# Patient Record
Sex: Female | Born: 1935 | Race: White | Hispanic: No | Smoking: Never smoker
Health system: Southern US, Community
[De-identification: ages and names within clinical notes are randomized; demographics above are authoritative.]

## PROBLEM LIST (undated history)

## (undated) DIAGNOSIS — F32A Depression, unspecified: Secondary | ICD-10-CM

## (undated) DIAGNOSIS — N390 Urinary tract infection, site not specified: Secondary | ICD-10-CM

## (undated) DIAGNOSIS — R55 Syncope and collapse: Secondary | ICD-10-CM

## (undated) DIAGNOSIS — N3020 Other chronic cystitis without hematuria: Secondary | ICD-10-CM

## (undated) DIAGNOSIS — K759 Inflammatory liver disease, unspecified: Secondary | ICD-10-CM

## (undated) DIAGNOSIS — T7840XA Allergy, unspecified, initial encounter: Secondary | ICD-10-CM

## (undated) DIAGNOSIS — M1990 Unspecified osteoarthritis, unspecified site: Secondary | ICD-10-CM

## (undated) DIAGNOSIS — F329 Major depressive disorder, single episode, unspecified: Secondary | ICD-10-CM

## (undated) DIAGNOSIS — H547 Unspecified visual loss: Secondary | ICD-10-CM

## (undated) DIAGNOSIS — Z87448 Personal history of other diseases of urinary system: Secondary | ICD-10-CM

## (undated) DIAGNOSIS — H548 Legal blindness, as defined in USA: Secondary | ICD-10-CM

## (undated) DIAGNOSIS — K259 Gastric ulcer, unspecified as acute or chronic, without hemorrhage or perforation: Secondary | ICD-10-CM

## (undated) DIAGNOSIS — E785 Hyperlipidemia, unspecified: Secondary | ICD-10-CM

## (undated) HISTORY — DX: Unspecified osteoarthritis, unspecified site: M19.90

## (undated) HISTORY — DX: Other chronic cystitis without hematuria: N30.20

## (undated) HISTORY — PX: CATARACT EXTRACTION W/ INTRAOCULAR LENS IMPLANT: SHX1309

## (undated) HISTORY — DX: Inflammatory liver disease, unspecified: K75.9

## (undated) HISTORY — PX: BLADDER SUSPENSION: SHX72

## (undated) HISTORY — DX: Allergy, unspecified, initial encounter: T78.40XA

## (undated) HISTORY — DX: Depression, unspecified: F32.A

## (undated) HISTORY — PX: ABDOMINAL HYSTERECTOMY: SHX81

## (undated) HISTORY — DX: Personal history of other diseases of urinary system: Z87.448

## (undated) HISTORY — DX: Major depressive disorder, single episode, unspecified: F32.9

## (undated) HISTORY — DX: Unspecified visual loss: H54.7

## (undated) HISTORY — DX: Hyperlipidemia, unspecified: E78.5

## (undated) HISTORY — PX: ESOPHAGOGASTRODUODENOSCOPY: SHX1529

---

## 1999-11-03 ENCOUNTER — Encounter: Admission: RE | Admit: 1999-11-03 | Discharge: 1999-11-03 | Payer: Self-pay | Admitting: Internal Medicine

## 1999-11-03 ENCOUNTER — Encounter: Payer: Self-pay | Admitting: Internal Medicine

## 2000-11-15 ENCOUNTER — Encounter: Admission: RE | Admit: 2000-11-15 | Discharge: 2000-11-15 | Payer: Self-pay | Admitting: Internal Medicine

## 2000-11-15 ENCOUNTER — Encounter: Payer: Self-pay | Admitting: Internal Medicine

## 2001-03-15 ENCOUNTER — Other Ambulatory Visit: Admission: RE | Admit: 2001-03-15 | Discharge: 2001-03-15 | Payer: Self-pay | Admitting: Gastroenterology

## 2001-03-15 ENCOUNTER — Encounter (INDEPENDENT_AMBULATORY_CARE_PROVIDER_SITE_OTHER): Payer: Self-pay | Admitting: Specialist

## 2001-11-16 ENCOUNTER — Encounter: Payer: Self-pay | Admitting: Internal Medicine

## 2001-11-16 ENCOUNTER — Encounter: Admission: RE | Admit: 2001-11-16 | Discharge: 2001-11-16 | Payer: Self-pay | Admitting: Internal Medicine

## 2002-11-27 ENCOUNTER — Encounter: Admission: RE | Admit: 2002-11-27 | Discharge: 2002-11-27 | Payer: Self-pay | Admitting: Internal Medicine

## 2002-11-27 ENCOUNTER — Encounter: Payer: Self-pay | Admitting: Internal Medicine

## 2003-11-28 ENCOUNTER — Encounter: Admission: RE | Admit: 2003-11-28 | Discharge: 2003-11-28 | Payer: Self-pay | Admitting: Internal Medicine

## 2004-12-25 ENCOUNTER — Encounter: Admission: RE | Admit: 2004-12-25 | Discharge: 2004-12-25 | Payer: Self-pay | Admitting: Internal Medicine

## 2005-03-03 ENCOUNTER — Ambulatory Visit: Payer: Self-pay | Admitting: Gastroenterology

## 2005-03-18 ENCOUNTER — Ambulatory Visit: Payer: Self-pay | Admitting: Gastroenterology

## 2005-10-30 ENCOUNTER — Emergency Department (HOSPITAL_COMMUNITY): Admission: EM | Admit: 2005-10-30 | Discharge: 2005-10-30 | Payer: Self-pay | Admitting: Family Medicine

## 2005-12-27 ENCOUNTER — Ambulatory Visit: Payer: Self-pay | Admitting: Internal Medicine

## 2005-12-30 ENCOUNTER — Encounter: Admission: RE | Admit: 2005-12-30 | Discharge: 2005-12-30 | Payer: Self-pay | Admitting: Internal Medicine

## 2006-01-20 ENCOUNTER — Ambulatory Visit: Payer: Self-pay | Admitting: Internal Medicine

## 2006-02-06 ENCOUNTER — Emergency Department (HOSPITAL_COMMUNITY): Admission: EM | Admit: 2006-02-06 | Discharge: 2006-02-06 | Payer: Self-pay | Admitting: Emergency Medicine

## 2006-02-07 ENCOUNTER — Ambulatory Visit: Payer: Self-pay | Admitting: Internal Medicine

## 2006-02-07 ENCOUNTER — Ambulatory Visit: Payer: Self-pay | Admitting: Gastroenterology

## 2006-02-15 ENCOUNTER — Ambulatory Visit: Payer: Self-pay | Admitting: Gastroenterology

## 2006-03-15 ENCOUNTER — Ambulatory Visit: Payer: Self-pay | Admitting: Gastroenterology

## 2006-05-09 ENCOUNTER — Emergency Department (HOSPITAL_COMMUNITY): Admission: EM | Admit: 2006-05-09 | Discharge: 2006-05-09 | Payer: Self-pay | Admitting: Family Medicine

## 2007-01-12 ENCOUNTER — Encounter: Admission: RE | Admit: 2007-01-12 | Discharge: 2007-01-12 | Payer: Self-pay | Admitting: Internal Medicine

## 2007-01-12 LAB — HM MAMMOGRAPHY: HM Mammogram: NORMAL

## 2007-01-19 ENCOUNTER — Ambulatory Visit: Payer: Self-pay | Admitting: Internal Medicine

## 2007-01-19 LAB — CONVERTED CEMR LAB
ALT: 53 units/L — ABNORMAL HIGH (ref 0–35)
AST: 68 units/L — ABNORMAL HIGH (ref 0–37)
Albumin: 3.8 g/dL (ref 3.5–5.2)
Basophils Absolute: 0.1 10*3/uL (ref 0.0–0.1)
Cholesterol: 246 mg/dL (ref 0–200)
Direct LDL: 172.1 mg/dL
HCT: 41.2 % (ref 36.0–46.0)
HDL: 51.1 mg/dL (ref >39.0)
Monocytes Absolute: 0.6 10*3/uL (ref 0.2–0.7)
Monocytes Relative: 11.6 % — ABNORMAL HIGH (ref 3.0–11.0)
Neutrophils Relative %: 62.4 % (ref 43.0–77.0)
Platelets: 246 10*3/uL (ref 150–400)
RBC: 4.53 M/uL (ref 3.87–5.11)
RDW: 12 % (ref 11.5–14.6)
TSH: 1.3 microintl units/mL (ref 0.35–5.50)
Total Bilirubin: 1 mg/dL (ref 0.3–1.2)
Total Protein: 7.4 g/dL (ref 6.0–8.3)

## 2007-01-20 DIAGNOSIS — Z87448 Personal history of other diseases of urinary system: Secondary | ICD-10-CM

## 2007-01-20 DIAGNOSIS — K759 Inflammatory liver disease, unspecified: Secondary | ICD-10-CM | POA: Insufficient documentation

## 2007-02-21 ENCOUNTER — Encounter: Payer: Self-pay | Admitting: Internal Medicine

## 2007-02-21 ENCOUNTER — Ambulatory Visit: Payer: Self-pay | Admitting: Internal Medicine

## 2007-02-21 DIAGNOSIS — E785 Hyperlipidemia, unspecified: Secondary | ICD-10-CM | POA: Insufficient documentation

## 2007-04-07 ENCOUNTER — Emergency Department (HOSPITAL_COMMUNITY): Admission: EM | Admit: 2007-04-07 | Discharge: 2007-04-07 | Payer: Self-pay | Admitting: Emergency Medicine

## 2007-04-17 ENCOUNTER — Encounter: Payer: Self-pay | Admitting: Internal Medicine

## 2007-04-26 ENCOUNTER — Ambulatory Visit: Payer: Self-pay | Admitting: Internal Medicine

## 2007-05-01 ENCOUNTER — Encounter: Payer: Self-pay | Admitting: Internal Medicine

## 2007-05-04 ENCOUNTER — Telehealth: Payer: Self-pay | Admitting: Internal Medicine

## 2007-05-05 LAB — CONVERTED CEMR LAB
Albumin: 3.3 g/dL — ABNORMAL LOW (ref 3.5–5.2)
Alkaline Phosphatase: 122 units/L — ABNORMAL HIGH (ref 39–117)
Basophils Relative: 1 % (ref 0.0–1.0)
Cholesterol: 225 mg/dL (ref 0–200)
Direct LDL: 152.1 mg/dL
Eosinophils Absolute: 0.2 10*3/uL (ref 0.0–0.6)
Eosinophils Relative: 2.5 % (ref 0.0–5.0)
HCT: 34.8 % — ABNORMAL LOW (ref 36.0–46.0)
Hemoglobin: 11.9 g/dL — ABNORMAL LOW (ref 12.0–15.0)
MCHC: 34.1 g/dL (ref 30.0–36.0)
MCV: 91.1 fL (ref 78.0–100.0)
Monocytes Relative: 11.1 % — ABNORMAL HIGH (ref 3.0–11.0)
Platelets: 275 10*3/uL (ref 150–400)
RBC: 3.82 M/uL — ABNORMAL LOW (ref 3.87–5.11)
Total Protein: 6.9 g/dL (ref 6.0–8.3)
Triglycerides: 49 mg/dL (ref 0–149)
VLDL: 10 mg/dL (ref 0–40)

## 2007-05-29 ENCOUNTER — Encounter: Payer: Self-pay | Admitting: Internal Medicine

## 2007-07-03 ENCOUNTER — Encounter: Payer: Self-pay | Admitting: Internal Medicine

## 2007-09-22 ENCOUNTER — Ambulatory Visit: Payer: Self-pay | Admitting: Internal Medicine

## 2007-09-25 ENCOUNTER — Encounter (INDEPENDENT_AMBULATORY_CARE_PROVIDER_SITE_OTHER): Payer: Self-pay | Admitting: *Deleted

## 2007-10-04 ENCOUNTER — Encounter: Payer: Self-pay | Admitting: Internal Medicine

## 2007-10-16 ENCOUNTER — Telehealth: Payer: Self-pay | Admitting: Internal Medicine

## 2007-12-04 ENCOUNTER — Telehealth: Payer: Self-pay | Admitting: Gastroenterology

## 2007-12-04 DIAGNOSIS — R1033 Periumbilical pain: Secondary | ICD-10-CM | POA: Insufficient documentation

## 2007-12-05 ENCOUNTER — Ambulatory Visit: Payer: Self-pay | Admitting: Gastroenterology

## 2007-12-05 LAB — CONVERTED CEMR LAB
ALT: 15 units/L (ref 0–35)
AST: 27 units/L (ref 0–37)
Albumin: 3.5 g/dL (ref 3.5–5.2)
BUN: 16 mg/dL (ref 6–23)
Bilirubin, Direct: 0.1 mg/dL (ref 0.0–0.3)
Chloride: 106 meq/L (ref 96–112)
GFR calc Af Amer: 68 mL/min
GFR calc non Af Amer: 57 mL/min
Glucose, Bld: 111 mg/dL — ABNORMAL HIGH (ref 70–99)
Lymphocytes Relative: 32.1 % (ref 12.0–46.0)
MCHC: 33.1 g/dL (ref 30.0–36.0)
Monocytes Relative: 10 % (ref 3.0–12.0)
Platelets: 244 10*3/uL (ref 150–400)
RBC: 4.26 M/uL (ref 3.87–5.11)
Sodium: 139 meq/L (ref 135–145)
Total Protein: 6.9 g/dL (ref 6.0–8.3)
WBC: 4.7 10*3/uL (ref 4.5–10.5)

## 2007-12-06 ENCOUNTER — Ambulatory Visit: Payer: Self-pay | Admitting: Gastroenterology

## 2007-12-08 ENCOUNTER — Ambulatory Visit: Payer: Self-pay | Admitting: Internal Medicine

## 2007-12-11 ENCOUNTER — Telehealth: Payer: Self-pay | Admitting: Gastroenterology

## 2007-12-12 ENCOUNTER — Ambulatory Visit: Payer: Self-pay | Admitting: Gastroenterology

## 2007-12-15 ENCOUNTER — Encounter: Payer: Self-pay | Admitting: Gastroenterology

## 2007-12-15 ENCOUNTER — Ambulatory Visit: Payer: Self-pay | Admitting: Gastroenterology

## 2007-12-18 ENCOUNTER — Telehealth (INDEPENDENT_AMBULATORY_CARE_PROVIDER_SITE_OTHER): Payer: Self-pay | Admitting: *Deleted

## 2007-12-20 ENCOUNTER — Encounter: Payer: Self-pay | Admitting: Gastroenterology

## 2008-01-19 ENCOUNTER — Telehealth: Payer: Self-pay | Admitting: Gastroenterology

## 2008-01-23 ENCOUNTER — Ambulatory Visit: Payer: Self-pay | Admitting: Gastroenterology

## 2008-01-23 ENCOUNTER — Telehealth: Payer: Self-pay | Admitting: Gastroenterology

## 2008-01-24 ENCOUNTER — Telehealth (INDEPENDENT_AMBULATORY_CARE_PROVIDER_SITE_OTHER): Payer: Self-pay | Admitting: *Deleted

## 2008-01-31 ENCOUNTER — Telehealth: Payer: Self-pay | Admitting: Gastroenterology

## 2008-02-02 ENCOUNTER — Ambulatory Visit (HOSPITAL_COMMUNITY): Admission: RE | Admit: 2008-02-02 | Discharge: 2008-02-02 | Payer: Self-pay | Admitting: Gastroenterology

## 2008-03-08 ENCOUNTER — Ambulatory Visit: Payer: Self-pay | Admitting: Internal Medicine

## 2008-03-08 DIAGNOSIS — R634 Abnormal weight loss: Secondary | ICD-10-CM

## 2008-03-08 DIAGNOSIS — G4700 Insomnia, unspecified: Secondary | ICD-10-CM

## 2008-03-11 ENCOUNTER — Ambulatory Visit: Payer: Self-pay | Admitting: Psychology

## 2008-03-22 ENCOUNTER — Ambulatory Visit: Payer: Self-pay | Admitting: Internal Medicine

## 2008-04-04 ENCOUNTER — Observation Stay (HOSPITAL_COMMUNITY): Admission: AD | Admit: 2008-04-04 | Discharge: 2008-04-06 | Payer: Self-pay | Admitting: Internal Medicine

## 2008-04-04 ENCOUNTER — Ambulatory Visit: Payer: Self-pay | Admitting: Internal Medicine

## 2008-04-04 ENCOUNTER — Encounter: Payer: Self-pay | Admitting: Internal Medicine

## 2008-04-19 ENCOUNTER — Ambulatory Visit: Payer: Self-pay | Admitting: Internal Medicine

## 2008-05-09 ENCOUNTER — Ambulatory Visit: Payer: Self-pay | Admitting: Internal Medicine

## 2008-06-24 ENCOUNTER — Ambulatory Visit: Payer: Self-pay | Admitting: Internal Medicine

## 2008-06-24 ENCOUNTER — Encounter: Payer: Self-pay | Admitting: Internal Medicine

## 2009-02-05 ENCOUNTER — Ambulatory Visit: Payer: Self-pay | Admitting: Internal Medicine

## 2010-03-06 ENCOUNTER — Ambulatory Visit: Payer: Self-pay | Admitting: Internal Medicine

## 2010-03-06 DIAGNOSIS — F329 Major depressive disorder, single episode, unspecified: Secondary | ICD-10-CM

## 2010-03-06 DIAGNOSIS — F32A Depression, unspecified: Secondary | ICD-10-CM | POA: Insufficient documentation

## 2010-03-06 DIAGNOSIS — F450 Somatization disorder: Secondary | ICD-10-CM

## 2010-03-06 LAB — CONVERTED CEMR LAB
ALT: 13 units/L (ref 0–35)
Albumin: 3.6 g/dL (ref 3.5–5.2)
Alkaline Phosphatase: 73 units/L (ref 39–117)
Basophils Absolute: 0 10*3/uL (ref 0.0–0.1)
Calcium: 9.5 mg/dL (ref 8.4–10.5)
GFR calc non Af Amer: 67.83 mL/min (ref >60)
HCT: 39.1 % (ref 36.0–46.0)
Hemoglobin: 13.3 g/dL (ref 12.0–15.0)
Lipase: 54 units/L (ref 11.0–59.0)
Lymphs Abs: 1.1 10*3/uL (ref 0.7–4.0)
MCV: 92 fL (ref 78.0–100.0)
Monocytes Relative: 12 % (ref 3.0–12.0)
Neutrophils Relative %: 61.6 % (ref 43.0–77.0)
Platelets: 203 10*3/uL (ref 150.0–400.0)
Potassium: 4.4 meq/L (ref 3.5–5.1)
Sodium: 140 meq/L (ref 135–145)
Total Bilirubin: 0.5 mg/dL (ref 0.3–1.2)
Urine Glucose: NEGATIVE mg/dL
WBC: 4.6 10*3/uL (ref 4.5–10.5)
pH: 6 (ref 5.0–8.0)

## 2010-03-23 ENCOUNTER — Telehealth: Payer: Self-pay | Admitting: Internal Medicine

## 2010-05-05 ENCOUNTER — Telehealth: Payer: Self-pay | Admitting: Internal Medicine

## 2010-07-23 NOTE — Assessment & Plan Note (Signed)
Summary: F/U APPT/#/CD   Vital Signs:  Patient profile:   77 year old female Height:      68 inches Weight:      119.25 pounds BMI:     18.20 O2 Sat:      97 % on Room air Temp:     96.1 degrees F oral Pulse rate:   80 / minute Pulse (ortho):   16 / minute Pulse rhythm:   regular Resp:     16 per minute BP sitting:   100 / 66 Cuff size:   regular  O2 Flow:  Room air  Primary Care [place]:  Etta Grandchild MD   History of Present Illness: She returns for f/up and she says that she ran out of her supply of meds one month ago and since then she has had a recurrence of depression, diffuse abd. pain, and a rotten taste in her mouth. She has anhedonia, fatigue, apathy, and insomnia ( DFA, EMA, EA ).  Preventive Screening-Counseling & Management  Alcohol-Tobacco     Alcohol drinks/day: 0     Smoking Status: never     Passive Smoke Exposure: no     Tobacco Counseling: not indicated; no tobacco use  Hep-HIV-STD-Contraception     Hepatitis Risk: no risk noted     HIV Risk: no     STD Risk: no risk noted      Sexual History:  not active.        Drug Use:  never.        Blood Transfusions:  no.    Medications Prior to Update: 1)  Remeron 30 Mg Tabs (Mirtazapine) .... One By Mouth Qhs 2)  Fish Oil 1000mg  .... 2 Once Daily 3)  Ambien 10 Mg Tabs (Zolpidem Tartrate) .... One By Mouth At Bedtime As Needed For Insomnia  Current Medications (verified): 1)  Fish Oil 1000mg  .... 2 Once Daily 2)  Ambien 10 Mg Tabs (Zolpidem Tartrate) .... One By Mouth At Bedtime As Needed For Insomnia 3)  Fluoxetine Hcl 20 Mg Caps (Fluoxetine Hcl) .... One By Mouth Once Daily 4)  Trazodone Hcl 50 Mg Tabs (Trazodone Hcl) .... 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 Retired Widow/Widower Never Smoked Alcohol use-no Drug use-no Regular exercise-no  Risk Factors: Alcohol Use: 0 (03/06/2010) Exercise: no (03/08/2008)  Risk Factors: Smoking Status: never (03/06/2010) Passive Smoke Exposure: no (03/06/2010)  Family History: Reviewed history from 12/06/2007 and no changes required. No FH of Colon Cancer:  Social History: Reviewed history from 03/08/2008 and no changes required. nonsmoker, nondrinker. 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  The patient denies anorexia, fever, weight loss, weight gain, chest pain, syncope, dyspnea on exertion, peripheral edema, prolonged cough, headaches, hemoptysis, melena,  hematochezia, severe indigestion/heartburn, hematuria, unusual weight change, enlarged lymph nodes, angioedema, and breast masses.   GI:  Complains of abdominal pain and gas; denies bloody stools, change in bowel habits, constipation, dark tarry stools, diarrhea, excessive appetite, hemorrhoids, indigestion, loss of appetite, nausea, vomiting, vomiting blood, and yellowish skin color. Psych:  Complains of anxiety, depression, and easily tearful; denies alternate hallucination ( auditory/visual), easily angered, irritability, mental problems, panic attacks, sense of great danger, suicidal thoughts/plans, thoughts of violence,  unusual visions or sounds, and thoughts /plans of harming others.  Physical Exam  General:  alert, well-developed, well-nourished, well-hydrated, appropriate dress, and underweight appearing.   Head:  normocephalic, atraumatic, no abnormalities observed, and no abnormalities palpated.   Eyes:  vision grossly intact, pupils equal, pupils round, and pupils reactive to light.   Mouth:  Oral mucosa and oropharynx without lesions or exudates.  Teeth in good repair. Neck:  supple, full ROM, no masses, no thyromegaly, no JVD, normal carotid upstroke, and no carotid bruits.   Lungs:  normal respiratory effort, no intercostal retractions, no accessory muscle use, normal breath sounds, no dullness, no fremitus, no crackles, and no wheezes.   Heart:  normal rate, regular rhythm, no murmur, no gallop, no rub, and no JVD.   Abdomen:  soft, non-tender, normal bowel sounds, no distention, no masses, no guarding, no rigidity, no rebound tenderness, no abdominal hernia, no inguinal hernia, no hepatomegaly, and no splenomegaly.   Msk:  normal ROM, no joint tenderness, no joint swelling, no joint warmth, no redness over joints, no joint deformities, no joint instability, and no crepitation.   Pulses:  R and L carotid,radial,femoral,dorsalis pedis and posterior tibial pulses are full and equal bilaterally Extremities:  No clubbing, cyanosis, edema, or deformity noted with normal full range of motion of all joints.   Neurologic:  No cranial nerve deficits noted. Station and gait are normal. Plantar reflexes are down-going bilaterally. DTRs are symmetrical throughout. Sensory, motor and coordinative functions appear intact. Skin:  Intact without suspicious lesions or rashes Cervical Nodes:  No lymphadenopathy noted Psych:  Oriented X3, memory intact for recent and remote, normally interactive, not anxious appearing, not agitated, not suicidal, not homicidal, and subdued.  dysphoric affect.     Impression &  Recommendations:  Problem # 1:  INSOMNIA-SLEEP DISORDER-UNSPEC (ICD-780.52) Assessment Unchanged  Her updated medication list for this problem includes:    Ambien 10 Mg Tabs (Zolpidem tartrate) ..... One by mouth at bedtime as needed for insomnia  Problem # 2:  DEPRESSIVE DISORDER (ICD-311) Assessment: Deteriorated  Orders: Venipuncture (40347) TLB-BMP (Basic Metabolic Panel-BMET) (80048-METABOL) TLB-CBC Platelet - w/Differential (85025-CBCD) TLB-Hepatic/Liver Function Pnl (80076-HEPATIC) TLB-TSH (Thyroid Stimulating Hormone) (84443-TSH) TLB-Amylase (82150-AMYL) TLB-Lipase (83690-LIPASE) TLB-Udip w/ Micro (81001-URINE)  The following medications were removed from the medication list:    Remeron 30 Mg Tabs (Mirtazapine) ..... One by mouth qhs Her updated medication list for this problem includes:    Fluoxetine Hcl 20 Mg Caps (Fluoxetine hcl) ..... One by mouth once daily    Trazodone Hcl 50 Mg Tabs (Trazodone hcl) ..... One by mouth qhs  Problem # 3:  ABDOMINAL PAIN, PERIUMBILICAL (ICD-789.05) Assessment: Unchanged will check labs again to look for organic illness but this has been thoroughly investiagted in the past and there were no abnormalities found, I think this is a somatic manifestation of her depression Orders: Venipuncture (42595) TLB-BMP (Basic Metabolic Panel-BMET) (80048-METABOL) TLB-CBC Platelet - w/Differential (85025-CBCD) TLB-Hepatic/Liver Function Pnl (80076-HEPATIC) TLB-TSH (Thyroid Stimulating Hormone) (84443-TSH) TLB-Amylase (82150-AMYL) TLB-Lipase (83690-LIPASE) TLB-Udip  w/ Micro (81001-URINE)  Problem # 4:  HEPATITIS NOS (ICD-573.3) Assessment: Unchanged  Orders: Venipuncture (16109) TLB-BMP (Basic Metabolic Panel-BMET) (80048-METABOL) TLB-CBC Platelet - w/Differential (85025-CBCD) TLB-Hepatic/Liver Function Pnl (80076-HEPATIC) TLB-TSH (Thyroid Stimulating Hormone) (84443-TSH) TLB-Amylase (82150-AMYL) TLB-Lipase (83690-LIPASE) TLB-Udip w/ Micro  (81001-URINE)  Complete Medication List: 1)  Fish Oil 1000mg   .... 2 once daily 2)  Ambien 10 Mg Tabs (Zolpidem tartrate) .... One by mouth at bedtime as needed for insomnia 3)  Fluoxetine Hcl 20 Mg Caps (Fluoxetine hcl) .... One by mouth once daily 4)  Trazodone Hcl 50 Mg Tabs (Trazodone hcl) .... One by mouth qhs  Patient Instructions: 1)  Please schedule a follow-up appointment in 3 months. Prescriptions: TRAZODONE HCL 50 MG TABS (TRAZODONE HCL) One by mouth qhs  #30 x 11   Entered and Authorized by:   Etta Grandchild MD   Signed by:   Etta Grandchild MD on 03/06/2010   Method used:   Print then Give to Patient   RxID:   [id number] FLUOXETINE HCL 20 MG CAPS (FLUOXETINE HCL) One by mouth once daily  #30 x 11   Entered and Authorized by:   Etta Grandchild MD   Signed by:   Etta Grandchild MD on 03/06/2010   Method used:   Print then Give to Patient   RxID:   [id number] AMBIEN 10 MG TABS (ZOLPIDEM TARTRATE) One by mouth at bedtime as needed for insomnia  #30 x 5   Entered and Authorized by:   Etta Grandchild MD   Signed by:   Etta Grandchild MD on 03/06/2010   Method used:   Print then Give to Patient   RxID:   [id number]

## 2010-07-23 NOTE — Progress Notes (Signed)
Summary: PA prozac  Phone Note From Pharmacy   Caller: [facility] [address]. [phone]* Summary of Call: Received fax from [facility] stating that insurance plan does not cover fluoxetine. Must call [phone]  for more information (mem# [id number]). Initial call taken by: Rock Nephew CMA,  May 05, 2010 10:06 AM  Follow-up for Phone Call        [facility] and spoke with Lorelle Formosa, whom advised that claim was rejected originally due to trying to refull early. Claim was submitted again today and rx was payed for. No PA required per insurance.Alvy Beal Archie CMA  May 07, 2010 2:42 PM      Appended Document: PA prozac Pt informed

## 2010-07-23 NOTE — Progress Notes (Signed)
Summary: RESULTS  Phone Note Call from Patient Call back at Home Phone [phone]   Summary of Call: Patient is requesting results of labs.  Initial call taken by: Lamar Sprinkles, CMA,  March 23, 2010 11:28 AM  Follow-up for Phone Call        her labs look great!!!! Follow-up by: Etta Grandchild MD,  March 23, 2010 11:31 AM  Additional Follow-up for Phone Call Additional follow up Details #1::        Pt informed  Additional Follow-up by: Lamar Sprinkles, CMA,  March 23, 2010 11:50 AM

## 2010-08-10 ENCOUNTER — Telehealth: Payer: Self-pay | Admitting: Internal Medicine

## 2010-08-12 ENCOUNTER — Telehealth (INDEPENDENT_AMBULATORY_CARE_PROVIDER_SITE_OTHER): Payer: Self-pay | Admitting: *Deleted

## 2010-08-13 ENCOUNTER — Encounter: Payer: Self-pay | Admitting: Internal Medicine

## 2010-08-13 ENCOUNTER — Other Ambulatory Visit: Payer: Medicare Other

## 2010-08-13 ENCOUNTER — Ambulatory Visit (INDEPENDENT_AMBULATORY_CARE_PROVIDER_SITE_OTHER): Payer: Medicare Other | Admitting: Internal Medicine

## 2010-08-13 ENCOUNTER — Other Ambulatory Visit: Payer: Self-pay | Admitting: Internal Medicine

## 2010-08-13 DIAGNOSIS — K759 Inflammatory liver disease, unspecified: Secondary | ICD-10-CM

## 2010-08-13 DIAGNOSIS — R1033 Periumbilical pain: Secondary | ICD-10-CM

## 2010-08-13 DIAGNOSIS — Z01818 Encounter for other preprocedural examination: Secondary | ICD-10-CM

## 2010-08-13 DIAGNOSIS — R634 Abnormal weight loss: Secondary | ICD-10-CM

## 2010-08-13 DIAGNOSIS — Z23 Encounter for immunization: Secondary | ICD-10-CM

## 2010-08-13 DIAGNOSIS — E782 Mixed hyperlipidemia: Secondary | ICD-10-CM

## 2010-08-13 DIAGNOSIS — F329 Major depressive disorder, single episode, unspecified: Secondary | ICD-10-CM

## 2010-08-13 LAB — LIPID PANEL
Cholesterol: 236 mg/dL — ABNORMAL HIGH (ref 0–200)
HDL: 60.7 mg/dL (ref >39.00)
Total CHOL/HDL Ratio: 4
Triglycerides: 106 mg/dL (ref 0.0–149.0)
VLDL: 21.2 mg/dL (ref 0.0–40.0)

## 2010-08-13 LAB — CBC WITH DIFFERENTIAL/PLATELET
Basophils Absolute: 0 10*3/uL (ref 0.0–0.1)
Basophils Relative: 0.6 % (ref 0.0–3.0)
Eosinophils Absolute: 0.1 10*3/uL (ref 0.0–0.7)
HCT: 37.2 % (ref 36.0–46.0)
Monocytes Absolute: 0.5 10*3/uL (ref 0.1–1.0)
Monocytes Relative: 9 % (ref 3.0–12.0)
Neutrophils Relative %: 68.2 % (ref 43.0–77.0)
RBC: 4.02 Mil/uL (ref 3.87–5.11)
RDW: 13.1 % (ref 11.5–14.6)
WBC: 6 10*3/uL (ref 4.5–10.5)

## 2010-08-13 LAB — HEPATIC FUNCTION PANEL
ALT: 15 U/L (ref 0–35)
Albumin: 3.7 g/dL (ref 3.5–5.2)
Total Bilirubin: 0.6 mg/dL (ref 0.3–1.2)
Total Protein: 6.6 g/dL (ref 6.0–8.3)

## 2010-08-13 LAB — PROTIME-INR: Prothrombin Time: 11.8 s (ref 10.2–12.4)

## 2010-08-13 LAB — BASIC METABOLIC PANEL
Calcium: 9.5 mg/dL (ref 8.4–10.5)
Creatinine, Ser: 1.1 mg/dL (ref 0.4–1.2)
GFR: 53.1 mL/min — ABNORMAL LOW (ref >60.00)
Sodium: 140 mEq/L (ref 135–145)

## 2010-08-13 LAB — TSH: TSH: 0.72 u[IU]/mL (ref 0.35–5.50)

## 2010-08-13 LAB — LIPASE: Lipase: 46 U/L (ref 11.0–59.0)

## 2010-08-13 LAB — LDL CHOLESTEROL, DIRECT: Direct LDL: 164.3 mg/dL

## 2010-08-14 ENCOUNTER — Encounter: Payer: Self-pay | Admitting: Internal Medicine

## 2010-08-18 NOTE — Progress Notes (Signed)
Summary: RECORDS  Phone Note Call from Patient Call back at [facility] Phone [phone]   Summary of Call: Pt left vm - wants to know when last ekg and labs were- needs info for upcomming surgery.  Initial call taken by: Lamar Sprinkles, CMA,  August 10, 2010 4:02 PM  Follow-up for Phone Call        Left vm on pt's hm # to get release from Med records and have info sent to surgeon or stop by med records and sign out info if she wishes.  Follow-up by: Lamar Sprinkles, CMA,  August 11, 2010 3:45 PM

## 2010-08-18 NOTE — Assessment & Plan Note (Signed)
Summary: SURGICAL CLEARANCE / EKG /NWS   Vital Signs:  Patient profile:   77 year old female Menstrual status:  hysterectomy Height:      68 inches Weight:      119 pounds BMI:     18.16 O2 Sat:      98 % on Room air Temp:     98.6 degrees F oral Pulse rate:   72 / minute Pulse rhythm:   regular Resp:     16 per minute BP sitting:   120 / 60  (left arm) Cuff size:   regular  Vitals Entered By: Lanier Prude, CMA(AAMA) (August 13, 2010 3:00 PM)  O2 Flow:  Room air CC: surgical clearance  Is Patient Diabetic? No     Menstrual Status hysterectomy   Primary Care Provider:  Etta Grandchild MD  CC:  surgical clearance .  History of Present Illness: She returns for f/up and tells me that she has decided to have a mini-lift facial plastic surgery with Dr. Charolotte Eke and she needs a pre-op clearance from me. Her abd pain is unchanged - she awakens in the morning feeling fine and then as the day progresses she develops a gnawing sensation in her "tummy."  Preventive Screening-Counseling & Management  Alcohol-Tobacco     Alcohol drinks/day: 0     Alcohol Counseling: not indicated; patient does not drink     Smoking Status: never     Passive Smoke Exposure: no     Tobacco Counseling: not indicated; no tobacco use  Hep-HIV-STD-Contraception     Hepatitis Risk: no risk noted     HIV Risk: no     STD Risk: no risk noted      Sexual History:  not active.        Drug Use:  never.        Blood Transfusions:  no.    Clinical Review Panels:  Prevention   Last Mammogram:  Normal (01/12/2007)  Immunizations   Last Tetanus Booster:  Tdap (08/13/2010)  Lipid Management   Cholesterol:  225 (04/26/2007)   LDL (bad choesterol):  DEL (04/26/2007)   HDL (good cholesterol):  54.8 (04/26/2007)  Diabetes Management   Creatinine:  0.9 (03/06/2010)  CBC   WBC:  4.6 (03/06/2010)   RBC:  4.25 (03/06/2010)   Hgb:  13.3 (03/06/2010)   Hct:  39.1 (03/06/2010)   Platelets:   203.0 (03/06/2010)   MCV  92.0 (03/06/2010)   MCHC  34.1 (03/06/2010)   RDW  13.1 (03/06/2010)   PMN:  61.6 (03/06/2010)   Lymphs:  24.2 (03/06/2010)   Monos:  12.0 (03/06/2010)   Eosinophils:  1.6 (03/06/2010)   Basophil:  0.6 (03/06/2010)  Complete Metabolic Panel   Glucose:  71 (03/06/2010)   Sodium:  140 (03/06/2010)   Potassium:  4.4 (03/06/2010)   Chloride:  107 (03/06/2010)   CO2:  27 (03/06/2010)   BUN:  20 (03/06/2010)   Creatinine:  0.9 (03/06/2010)   Albumin:  3.6 (03/06/2010)   Total Protein:  6.5 (03/06/2010)   Calcium:  9.5 (03/06/2010)   Total Bili:  0.5 (03/06/2010)   Alk Phos:  73 (03/06/2010)   SGPT (ALT):  13 (03/06/2010)   SGOT (AST):  23 (03/06/2010)   Medications Prior to Update: 1)  Fish Oil 1000mg  .... 2 Once Daily 2)  Ambien 10 Mg Tabs (Zolpidem Tartrate) .... One By Mouth At Bedtime As Needed For Insomnia 3)  Fluoxetine Hcl 20 Mg Caps (Fluoxetine Hcl) .... One By  Mouth Once Daily 4)  Trazodone Hcl 50 Mg Tabs (Trazodone Hcl) .... One By Mouth Qhs  Current Medications (verified): 1)  Fish Oil 1000mg  .... 2 Once Daily 2)  Ambien 10 Mg Tabs (Zolpidem Tartrate) .... One By Mouth At Bedtime As Needed For Insomnia 3)  Fluoxetine Hcl 20 Mg Caps (Fluoxetine Hcl) .... One By Mouth Once Daily 4)  Trazodone Hcl 50 Mg Tabs (Trazodone Hcl) .... One By Mouth At Bedtime  Allergies (verified): No Known Drug Allergies  Past History:  Past Medical History: Last updated: 03/08/2008  HYPERLIPIDEMIA, MIXED (ICD-272.2) HEPATITIS NOS (ICD-573.3), statin related, resolved quickly HEMATURIA, HX OF (ICD-V13.09) congenital blindness in the right eye Cataract surgery 2 weeks ago Chronic cystitis with no evidence of infection  Past Surgical History: Last updated: 01/20/2007 left eye surgery, 100% visual loss vaginal hysterectomy A-P bladder suspension  Family History: Last updated: 12/06/2007 No FH of Colon Cancer:  Social History: Last updated:  03/08/2008 nonsmoker, nondrinker. Retired Widow/Widower Never Smoked Alcohol use-no Drug use-no Regular exercise-no  Risk Factors: Alcohol Use: 0 (08/13/2010) Exercise: no (03/08/2008)  Risk Factors: Smoking Status: never (08/13/2010) Passive Smoke Exposure: no (08/13/2010)  Family History: Reviewed history from 12/06/2007 and no changes required. No FH of Colon Cancer:  Social History: Reviewed history from 03/08/2008 and no changes required. nonsmoker, nondrinker. 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   GI:  Complains of abdominal pain; denies bloody stools, change in bowel habits, constipation, dark tarry stools, diarrhea, excessive appetite, gas, hemorrhoids, indigestion, loss of appetite, nausea, vomiting, vomiting blood, and yellowish skin color. Psych:  Denies anxiety, depression, easily angered, easily tearful, irritability, mental problems, panic attacks, sense of great danger, suicidal thoughts/plans, thoughts of violence, unusual visions or sounds, and thoughts /plans of harming others.  Physical Exam  General:  alert, well-developed, well-nourished, well-hydrated, appropriate dress, normal appearance, healthy-appearing, cooperative to examination, good hygiene, and underweight appearing.   Head:  normocephalic, atraumatic, no abnormalities observed, and no abnormalities palpated.   Eyes:  vision grossly intact, pupils equal, and no injection or icterus. Ears:  R ear normal and L ear normal.   Nose:  External nasal  examination shows no deformity or inflammation. Nasal mucosa are pink and moist without lesions or exudates. Mouth:  Oral mucosa and oropharynx without lesions or exudates.  Teeth in good repair. Neck:  supple, full ROM, no masses, no thyromegaly, no JVD, normal carotid upstroke, and no carotid bruits.   Lungs:  normal respiratory effort, no intercostal retractions, no accessory muscle use, normal breath sounds, no dullness, no fremitus, no crackles, and no wheezes.   Heart:  normal rate, regular rhythm, no murmur, no gallop, no rub, and no JVD.   Abdomen:  soft, non-tender, normal bowel sounds, no distention, no masses, no guarding, no rigidity, no rebound tenderness, no abdominal hernia, no inguinal hernia, no hepatomegaly, and no splenomegaly.   Msk:  No deformity or scoliosis noted of thoracic or lumbar spine.   Pulses:  R and L carotid,radial,femoral,dorsalis pedis and posterior tibial pulses are full and equal bilaterally Extremities:  No clubbing, cyanosis, edema, or deformity noted with normal full range of motion of all joints.   Neurologic:  No cranial  nerve deficits noted. Station and gait are normal. Plantar reflexes are down-going bilaterally. DTRs are symmetrical throughout. Sensory, motor and coordinative functions appear intact. Skin:  Intact without suspicious lesions or rashes Cervical Nodes:  No lymphadenopathy noted Psych:  Oriented X3, memory intact for recent and remote, normally interactive, good eye contact, not anxious appearing, not depressed appearing, not agitated, not suicidal, and not homicidal.     Impression & Recommendations:  Problem # 1:  PREOPERATIVE EXAMINATION (ICD-V72.84) Assessment New  Orders: Venipuncture (44010) EKG w/ Interpretation (93000) TLB-Lipid Panel (80061-LIPID) TLB-BMP (Basic Metabolic Panel-BMET) (80048-METABOL) TLB-CBC Platelet - w/Differential (85025-CBCD) TLB-Hepatic/Liver Function Pnl (80076-HEPATIC) TLB-TSH (Thyroid Stimulating  Hormone) (84443-TSH) TLB-Amylase (82150-AMYL) TLB-Lipase (83690-LIPASE) TLB-PT (Protime) (85610-PTP) TLB-PTT (85730-PTTL)  Problem # 2:  DEPRESSIVE DISORDER (ICD-311) Assessment: Improved  Her updated medication list for this problem includes:    Fluoxetine Hcl 20 Mg Caps (Fluoxetine hcl) ..... One by mouth once daily    Trazodone Hcl 50 Mg Tabs (Trazodone hcl) ..... One by mouth at bedtime  Problem # 3:  ABDOMINAL PAIN, PERIUMBILICAL (ICD-789.05) Assessment: Unchanged this appears unchanged, I will recheck her labs today to look for organic pathology but I think this a somatic complaint stemming from her depression. The pain has been thoroughly investigated in the past. Orders: Venipuncture (27253) TLB-Lipid Panel (80061-LIPID) TLB-BMP (Basic Metabolic Panel-BMET) (80048-METABOL) TLB-CBC Platelet - w/Differential (85025-CBCD) TLB-Hepatic/Liver Function Pnl (80076-HEPATIC) TLB-TSH (Thyroid Stimulating Hormone) (84443-TSH) TLB-Amylase (82150-AMYL) TLB-Lipase (83690-LIPASE) TLB-PT (Protime) (85610-PTP) TLB-PTT (85730-PTTL)  Problem # 4:  WEIGHT LOSS, ABNORMAL (ICD-783.21) Assessment: Unchanged  Orders: Venipuncture (66440) TLB-Lipid Panel (80061-LIPID) TLB-BMP (Basic Metabolic Panel-BMET) (80048-METABOL) TLB-CBC Platelet - w/Differential (85025-CBCD) TLB-Hepatic/Liver Function Pnl (80076-HEPATIC) TLB-TSH (Thyroid Stimulating Hormone) (84443-TSH) TLB-Amylase (82150-AMYL) TLB-Lipase (83690-LIPASE) TLB-PT (Protime) (85610-PTP) TLB-PTT (85730-PTTL)  Problem # 5:  WEIGHT LOSS, ABNORMAL (ICD-783.21) Assessment: Unchanged  Orders: Venipuncture (34742) TLB-Lipid Panel (80061-LIPID) TLB-BMP (Basic Metabolic Panel-BMET) (80048-METABOL) TLB-CBC Platelet - w/Differential (85025-CBCD) TLB-Hepatic/Liver Function Pnl (80076-HEPATIC) TLB-TSH (Thyroid Stimulating Hormone) (84443-TSH) TLB-Amylase (82150-AMYL) TLB-Lipase (83690-LIPASE) TLB-PT (Protime) (85610-PTP) TLB-PTT  (85730-PTTL)  Complete Medication List: 1)  Fish Oil 1000mg   .... 2 once daily 2)  Ambien 10 Mg Tabs (Zolpidem tartrate) .... One by mouth at bedtime as needed for insomnia 3)  Fluoxetine Hcl 20 Mg Caps (Fluoxetine hcl) .... One by mouth once daily 4)  Trazodone Hcl 50 Mg Tabs (Trazodone hcl) .... One by mouth at bedtime  Other Orders: Tdap => 54yrs IM (59563) Admin 1st Vaccine (87564)  Patient Instructions: 1)  Please schedule a follow-up appointment in 3 months.   Orders Added: 1)  Venipuncture [36415] 2)  Tdap => 22yrs IM [90715] 3)  Admin 1st Vaccine [90471] 4)  EKG w/ Interpretation [93000] 5)  TLB-Lipid Panel [80061-LIPID] 6)  TLB-BMP (Basic Metabolic Panel-BMET) [80048-METABOL] 7)  TLB-CBC Platelet - w/Differential [85025-CBCD] 8)  TLB-Hepatic/Liver Function Pnl [80076-HEPATIC] 9)  TLB-TSH (Thyroid Stimulating Hormone) [84443-TSH] 10)  TLB-Amylase [82150-AMYL] 11)  TLB-Lipase [83690-LIPASE] 12)  TLB-PT (Protime) [85610-PTP] 13)  TLB-PTT [85730-PTTL] 14)  Est. Patient Level IV [33295]   Immunizations Administered:  Tetanus Vaccine:    Vaccine Type: Tdap    Site: left deltoid    Mfr: GlaxoSmithKline    Dose: 0.5 ml    Route: IM    Given by: Lanier Prude, CMA(AAMA)    Exp. Date: 04/09/2012    Lot #: [id number]    VIS given: 05/08/08 version given August 13, 2010.   Immunizations Administered:  Tetanus Vaccine:    Vaccine Type: Tdap    Site: left  deltoid    Mfr: GlaxoSmithKline    Dose: 0.5 ml    Route: IM    Given by: Lanier Prude, CMA(AAMA)    Exp. Date: 04/09/2012    Lot #: [id number]    VIS given: 05/08/08 version given August 13, 2010.

## 2010-08-18 NOTE — Progress Notes (Signed)
Summary: Please Advise  Phone Note Call from Patient Call back at Home Phone [phone]   Caller: Patient Call For: Etta Grandchild MD Summary of Call: Pt wants to know date of her last EKG and wants to sched ule EKG only without a MD appt, just the EKG, is this ok?? Initial call taken by: Verdell Face,  August 12, 2010 2:44 PM  Follow-up for Phone Call        Appt Scheduled (by Harriett Sine) for CPE prior to Sx on 08/26/10-EKG required.  Follow-up by: Burnard Leigh [facility]),  August 12, 2010 3:58 PM

## 2010-08-18 NOTE — Letter (Signed)
Summary: Lipid Letter  [facility]  [address] [place], [place] [address]   Phone: [phone]  Fax: [phone]    08/13/2010  Evelyn [name] [address] [place], [place]  [address]  Dear Evelyn [name]:  We have carefully reviewed your last lipid profile from 04/26/2007 and the results are noted below with a summary of recommendations for lipid management.    Cholesterol:       236     Goal: <   HDL "good" Cholesterol:   28.41     Goal: >   LDL "bad" Cholesterol:   164     Goal: <   Triglycerides:       106.0     Goal: <    your labs look good    TLC Diet (Therapeutic Lifestyle Change): Saturated Fats & Transfatty acids should be kept < 7% of total calories ***Reduce Saturated Fats Polyunstaurated Fat can be up to 10% of total calories Monounsaturated Fat Fat can be up to 20% of total calories Total Fat should be no greater than 25-35% of total calories Carbohydrates should be 50-60% of total calories Protein should be approximately 15% of total calories Fiber should be at least 20-30 grams a day ***Increased fiber may help lower LDL Total Cholesterol should be < 200mg /day Consider adding plant stanol/sterols to diet (example: Benacol spread) ***A higher intake of unsaturated fat may reduce Triglycerides and Increase HDL    Adjunctive Measures (may lower LIPIDS and reduce risk of Heart Attack) include: Aerobic Exercise (20-30 minutes 3-4 times a week) Limit Alcohol Consumption Weight Reduction Aspirin 75-81 mg a day by mouth (if not allergic or contraindicated) Dietary Fiber 20-30 grams a day by mouth     Current Medications: 1)    Fish Oil 1000mg   .... 2 once daily 2)    Ambien 10 Mg Tabs (Zolpidem tartrate) .... One by mouth at bedtime as needed for insomnia 3)    Fluoxetine Hcl 20 Mg Caps (Fluoxetine hcl) .... One by mouth once daily 4)    Trazodone Hcl 50 Mg Tabs (Trazodone hcl) .... One by mouth at bedtime  If you have any questions, please  call. We appreciate being able to work with you.   Sincerely,    [facility] Etta Grandchild MD

## 2010-08-18 NOTE — Letter (Signed)
Summary: Consultant Letter  All     ,     Phone:   Fax:     08/13/2010  Dear Dr. Charolotte Eke:  I am writing to report my evaluation of Evelyn [name], a 77 Years Old Female on whom I consulted 08/13/2010 at . When I saw the patient the Chief Complaint was "surgical clearance ".  At that time the history of the present illness was as follows: She returns for f/up and tells me that she has decided to have a mini-lift facial plastic surgery with Dr. Charolotte Eke and she needs a pre-op clearance from me. Her abd pain is unchanged - she awakens in the morning feeling fine and then as the day progresses she develops a gnawing sensation in her "tummy.".  Evelyn [name]'s Past Medical History at the time of my evaluation was notable for: PREOPERATIVE EXAMINATION (ICD-V72.84), DEPRESSIVE DISORDER (ICD-311), INSOMNIA-SLEEP DISORDER-UNSPEC (ICD-780.52), WEIGHT LOSS, ABNORMAL (ICD-783.21), ABDOMINAL PAIN, PERIUMBILICAL (ICD-789.05), HYPERLIPIDEMIA, MIXED (ICD-272.2), HEPATITIS NOS (ICD-573.3), HEMATURIA, HX OF (ICD-V13.09).  .  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  The family history is notable for: No FH of Colon Cancer: .  The social history is notable for: nonsmoker, nondrinker. 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    I have attached a copy of my clinic note to the end of this letter which details the review of systems and physical examination for your review. My assessment at that time was as follows: Marland Kitchen  The plan at that time was as follows: proceed with with surgery, she is low risk.  Evelyn [name]'s active problems now include: PREOPERATIVE EXAMINATION (ICD-V72.84) DEPRESSIVE DISORDER (ICD-311) OTHER CONSTIPATION (ICD-564.09) OTHER  CONSTIPATION (ICD-564.09) ACUTE BRONCHITIS (ICD-466.0) ACUTE BRONCHITIS (ICD-466.0) COUGH (ICD-786.2) COUGH (ICD-786.2) INSOMNIA-SLEEP DISORDER-UNSPEC (ICD-780.52) MAJOR DEPRESSIVE DISORDER SINGLE EPISODE MILD (ICD-296.21) MAJOR DEPRESSIVE DISORDER SINGLE EPISODE MILD (ICD-296.21) WEIGHT LOSS, ABNORMAL (ICD-783.21) ABDOMINAL PAIN, EPIGASTRIC (ICD-789.06) ABDOMINAL PAIN, EPIGASTRIC (ICD-789.06) ABDOMINAL PAIN, PERIUMBILICAL (ICD-789.05) DYSPEPSIA (ICD-536.8) DYSPEPSIA (ICD-536.8) SEBACEOUS CYST (ICD-706.2) SEBACEOUS CYST (ICD-706.2) HYPERLIPIDEMIA, MIXED (ICD-272.2) HEPATITIS NOS (ICD-573.3) HEMATURIA, HX OF (ICD-V13.09)   The medication list includes: * FISH OIL 1000MG  2 once daily AMBIEN 10 MG TABS (ZOLPIDEM TARTRATE) One by mouth at bedtime as needed for insomnia FLUOXETINE HCL 20 MG CAPS (FLUOXETINE HCL) One by mouth once daily TRAZODONE HCL 50 MG TABS (TRAZODONE HCL) One by mouth at bedtime   Active Orders include the following: Surgical Referral [Surgery]  I will send copies of the results of the above investigations as they become available to your office if you wish. The most recent office note and chart summary are attached. Thank you for the opportunity to participate in the care of Evelyn [name].  Yours truly,   Etta Grandchild MD

## 2010-08-18 NOTE — Letter (Signed)
Summary: Generic Letter  [facility]  [address] [place], [place] [address]   Phone: [phone]  Fax: [phone]    08/14/2010  Dear Dr. Charolotte Eke:  I am writing to report my evaluation of Evelyn [name], a 77 Years Old Female on whom I consulted 08/13/2010 at . When I saw the patient the Chief Complaint was "surgical clearance ".  At that time the history of the present illness was as follows: She returns for f/up and tells me that she has decided to have a mini-lift facial plastic surgery with Dr. Charolotte Eke and she needs a pre-op clearance from me. Her abd pain is unchanged - she awakens in the morning feeling fine and then as the day progresses she develops a gnawing sensation in her "tummy.".  Evelyn [name]'s Past Medical History at the time of my evaluation was notable for: PREOPERATIVE EXAMINATION (ICD-V72.84), DEPRESSIVE DISORDER (ICD-311), INSOMNIA-SLEEP DISORDER-UNSPEC (ICD-780.52), WEIGHT LOSS, ABNORMAL (ICD-783.21), ABDOMINAL PAIN, PERIUMBILICAL (ICD-789.05), HYPERLIPIDEMIA, MIXED (ICD-272.2), HEPATITIS NOS (ICD-573.3), HEMATURIA, HX OF (ICD-V13.09).  .  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  The family history is notable for: No FH of Colon Cancer: .  The social history is notable for: nonsmoker, nondrinker. 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    I have attached a copy of my clinic note to the end of this letter which details the review of systems and physical examination for your review. My assessment at that time was as follows: Marland Kitchen  The plan at that time was as follows: proceed with with surgery, she is low risk.  Evelyn [name]'s active problems now include: PREOPERATIVE EXAMINATION  (ICD-V72.84) DEPRESSIVE DISORDER (ICD-311) OTHER CONSTIPATION (ICD-564.09) OTHER CONSTIPATION (ICD-564.09) ACUTE BRONCHITIS (ICD-466.0) ACUTE BRONCHITIS (ICD-466.0) COUGH (ICD-786.2) COUGH (ICD-786.2) INSOMNIA-SLEEP DISORDER-UNSPEC (ICD-780.52) MAJOR DEPRESSIVE DISORDER SINGLE EPISODE MILD (ICD-296.21) MAJOR DEPRESSIVE DISORDER SINGLE EPISODE MILD (ICD-296.21) WEIGHT LOSS, ABNORMAL (ICD-783.21) ABDOMINAL PAIN, EPIGASTRIC (ICD-789.06) ABDOMINAL PAIN, EPIGASTRIC (ICD-789.06) ABDOMINAL PAIN, PERIUMBILICAL (ICD-789.05) DYSPEPSIA (ICD-536.8) DYSPEPSIA (ICD-536.8) SEBACEOUS CYST (ICD-706.2) SEBACEOUS CYST (ICD-706.2) HYPERLIPIDEMIA, MIXED (ICD-272.2) HEPATITIS NOS (ICD-573.3) HEMATURIA, HX OF (ICD-V13.09)   The medication list includes: * FISH OIL 1000MG  2 once daily AMBIEN 10 MG TABS (ZOLPIDEM TARTRATE) One by mouth at bedtime as needed for insomnia FLUOXETINE HCL 20 MG CAPS (FLUOXETINE HCL) One by mouth once daily TRAZODONE HCL 50 MG TABS (TRAZODONE HCL) One by mouth at bedtime   Active Orders include the following: Surgical Referral [Surgery]  I will send copies of the results of the above investigations as they become available to your office if you wish. The most recent office note and chart summary are attached. Thank you for the opportunity to participate in the care of Evelyn [name].  Yours truly,   Etta Grandchild MD          Appended Document: Generic Letter Faxed to [phone]

## 2010-11-03 NOTE — Consult Note (Signed)
NAMECAISLEY, BAXENDALE NO.:  [id number]   MEDICAL RECORD NO.:  [id number]          PATIENT TYPE:  INP   LOCATION:  1501                         FACILITY:  [facility]   PHYSICIAN:  Antonietta Breach, M.D.  DATE OF BIRTH:  07-Aug-1924   DATE OF CONSULTATION:  04/05/2008  DATE OF DISCHARGE:                                 CONSULTATION   REQUESTING PHYSICIAN:  Dr. Sanda Linger.   REASON FOR CONSULTATION:  Depression.   HISTORY OF PRESENT ILLNESS:  Mrs. Skylin Kennerson is an 77 year old female  admitted to the [facility] on April 04, 2008, due to  abdominal pain, nausea, vomiting, depression and weight loss.   Prior to her symptoms, she was going to the gym 2-3 times per week.  She  was also playing Bridge.  After her first abdominal pain episode  occurred approximately 2 months ago, she began to experience depressed  mood, low energy, anhedonia and thoughts of hopelessness.  She did have  some suicidal ideation for a short time approximately 2 weeks ago.  This  did stop.  She did see a psychologist as an outpatient for one visit.   Currently, she does not have any thoughts of harming herself.  She has  no thoughts of harming others.  She has no delusions or hallucinations.  She is on Prozac 20 mg daily and Zyprexa has been added acutely at 5 mg  nightly, however, her symptoms have not improved.   She has not improved with her general medical care in the hospital.   She has intact orientation and memory function.  She is socially  appropriate and cooperative with healthcare.   PAST PSYCHIATRIC HISTORY:  The patient has undergone past psychiatric  care.  In review of the past medical record, there is none listed prior  to this hospitalization; however, the primary care physician noted that  a psychiatrist in [place] who has been seeing the patient called him  and stated that the patient would not be safe at home.   Regarding her psychotropic medication, she  has taken trazodone as an  outpatient also along with Prozac.   FAMILY PSYCHIATRIC HISTORY:  None known.   SOCIAL HISTORY:  Mrs. Stenner has been living at home.  Religion,  [employer].  She does not use any alcohol or illegal drugs.  Please see  the above discussion regarding her activity prior to illness.  She is  occupationally retired.   PAST MEDICAL HISTORY:  1. Hyperlipidemia.  2. Hysterectomy.  3. Recent abdominal pain.   MEDICATIONS:  The MAR is reviewed.  1. Prozac 20 mg a day.  2. Zyprexa 2.5 mg nightly.  3. Atacand 0.5 mg q.4 h., p.r.n.  4. Ambien 5 mg nightly p.r.n.   ALLERGIES:  NO KNOWN DRUG ALLERGIES.   LABORATORY DATA:  SGOT 26, SGPT 15.   TSH, ESR, lipase, amylase all unremarkable.   WBC 6.3, hemoglobin 12.4, platelet count 226.   REVIEW OF SYSTEMS:  Constitutional, head, eyes, ears, nose and throat,  mouth neurologic, psychiatric, cardiovascular, respiratory,  gastrointestinal, genitourinary, skin, musculoskeletal, hematologic,  lymphatic,  endocrine and metabolic all unremarkable.   PHYSICAL EXAMINATION:  VITAL SIGNS:  Temperature 98.6, pulse 58,  respiratory rate 18, blood pressure 103/65.  O2 saturation on room air  97%.  GENERAL APPEARANCE:  Mrs. Hoot is an elderly female lying in a supine  position in her hospital bed with no abnormal involuntary movements.   MENTAL STATUS EXAM:  Mrs. Baine is alert.  Her eye contact is good.  Her attention span is mildly decreased.  Concentration mildly decreased.  Affect is constricted.  Mood depressed.  She is oriented to all spheres.  Memory is intact to immediate, recent and remote.  Fund of knowledge and  intelligence are within normal limits.  Speech involves normal rate and  prosody with no dysarthria.  Thought process is logical, coherent and  goal-directed.  No looseness of associations.  Thought content; no  thoughts of harming herself, no thoughts of harming others, no  delusions, no  hallucinations.  Insight is intact, judgment is intact.   ASSESSMENT:  AXIS I:  1. 293.83, mood disorder not otherwise specified (idiopathic and      general medical factors), depressed.  2. Rule out 296.23, major depressive disorder, single episode -      severe.  AXIS II:  None.  AXIS III:  See past medical history.  AXIS IV:  General medical, primary support group.  AXIS V:  Global Assessment of Functioning 55.   Mrs. Simson is not at risk to harm herself or others.  She agrees to  call emergency services immediately for any thoughts of harming herself,  thoughts of harming others or distress.   The undersigned provided ego supportive psychotherapy and education.   The indications, alternatives and adverse effects of the following were  discussed with the patient, Remeron and Prozac.  She understands and  wants to proceed as below.   RECOMMENDATIONS:  1. Would discontinue the Zyprexa.  2. Would start Remeron at 7.5 mg q.1800 and would increase by 7.5 mg      per day to her initial trial dose of 22.5 mg per day.  3. Would increase her Prozac to 30 mg daily after the second day of      Remeron and would increase again as tolerated to 40 mg daily.  4. Would monitor for any loose stools or nausea associated with      increased Prozac.  5. Would ask the social worker to set up an intensive outpatient      program in psychiatry for the patient following discharge.  There      are programs available at [facility], [facility], [facility] and [place], as well as other locations nearby.  6. Mrs. Montez Morita will not drive if drowsy.  7. The undersigned discussed the case with the primary attending      physician.   DISCUSSION:  Remeron synergizes with Prozac in that Remeron is an alpha  II blocker, increasing intrasynaptic norepinephrine.  Remeron also  blocks the 5-HT II receptor which can augment Prozac.   Remeron can also acutely stimulate appetite which has  been low in the  patient.  It can acutely increase sleep.  The patient has had insomnia   For residual decreased energy, the Remeron can be further adjusted up to  45 mg per day.   Long-term medication management can be continued with her outpatient  psychiatrist.   However, some thoughts upon alternative trials if she fails to respond  include a trial of Effexor combined with Remeron where the blood  pressure would need to be monitored when Effexor is titrated.      Antonietta Breach, M.D.  Electronically Signed     JW/MEDQ  D:  04/05/2008  T:  04/05/2008  Job:  [id number]

## 2010-11-03 NOTE — Assessment & Plan Note (Signed)
[facility]                           PRIMARY CARE OFFICE NOTE   NAME:[name], [name]                       MRN:          [id number]  DATE:01/19/2007                            DOB:          1925-02-08    Evelyn [name] is an 77 year old woman who is followed for hyperlipidemia  who presents for followup evaluation and examination. I last saw the  patient in the office February 07, 2006, at which time she had markedly  elevated LFTs. The patient was subsequently seen by the GI service.  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 Her liver functions  returned to normal with cessation of medication.   GU: The patient had a history of idiopathic hematuria and has had a full  evaluation including CT scan and cystoscopy. Unfortunately, the patient  continued to have significant bladder pain and pelvic pain. She was  treated for six months for recurrent infections and finally has come off  of antibiotics. She feels that this has caused her to have some sense of  weakness and is not really fully recovered her normal state of health,  although she does feel much better.   Please see my dictation of December 27, 2005, for past medical history,  family history and social history, which remains unchanged with the  additions as above.   CHART REVIEW:  Colonoscopy March 18, 2005, with internal  hemorrhoids. Last hospitalization was in 1996 for vesicle urethropexy.  Last mammogram was January 12, 2007, which was unremarkable. Pelvic CT scan  on February 07, 2006, which was unremarkable.   CURRENT MEDICATIONS:  1. Claritin over-the-counter.  2. Multivitamins only.   REVIEW OF SYSTEMS:  Is negative for constitutional, cardiovascular,  respiratory, GI or GU complaints at this time.   PHYSICAL EXAMINATION:  Temperature 97.0, blood pressure 131/81, pulse  80, weight 121 pounds.  GENERAL APPEARANCE: This is a slender woman looking younger than her  stated chronologic  age who is in no acute distress.  HEENT: Normocephalic, atraumatic. EAC's and TM's were normal. Oropharynx  with native dentition in good repair. No buccal or palatale lesions were  noted. Posterior pharynx was clear. Conjunctivae and sclerae were clear.  Pupils equal, round and reactive  NECK: Was supple without thyromegaly.  NODES: No lymphadenopathy was noted in the cervical or supraclavicular  regions.  CHEST: No deformities were noted.  LUNGS:  Clear with no rales, wheezes or rhonchi.  BREASTS: Skin was normal. Nipples without discharge. No fixed mass,  lesion or abnormalities were noted.  CARDIOVASCULAR: 2+ radial pulses. No JVD or carotid bruits. He had a  quiet precordium with regular rate and rhythm without murmurs, rubs or  gallops.  ABDOMEN: Soft. No guarding or rebound. No organosplenomegaly was  appreciated.  EXTREMITIES: Without clubbing, cyanosis, edema or deformities.   DATABASE:  Cholesterol was 246, HDL 51, triglycerides 74, LDL  cholesterol was 172.1. CBC with a white count of 5400 with a normal  differential. Hemoglobin was 14 grams. Platelet count was normal at  246,000. Chemistry reveals minimal elevation of liver functions at 53  and 68 respectively. TSH  was normal at 1.3.   ASSESSMENT/PLAN:  Hyperlipidemia. The patient with increased cholesterol  at 172. She is 77 years old. Her cardiac risks are minimal. She has had  a very bad reaction to statins in the past and continue to have mild  elevation in her liver functions.   PLAN:  1. We will have the patient to continue with good lifestyle management      with a low cholesterol diet. Will have her come to discuss with me      other options besides statin drugs for management of her      hyperlipidemia and would recommend we consider gemfibrozil as a      first line drug to try.  2. GU: The patient is currently stable. She had been followed by Dr.      Retta Diones. She will return to see him on an as directed  basis.  3. Health maintenance. The patient is current with colorectal cancer      screening. She is current with mammography. The patient would be a      good candidate for routine immunization therapy including pneumonia      vaccine and Zostavax when available.   She certainly is a pleasant woman who has recovered from a touch year of  problems with drug related hepatitis and frequent urinary tract  infections. She will return as above to discuss with me alternative  therapy for her hyperlipidemia.     Rosalyn Gess Norins, MD  Electronically Signed    MEN/MedQ  DD: 01/20/2007  DT: 01/20/2007  Job #: [id number]   cc:   Ms. Evelyn [name]

## 2010-11-06 NOTE — Assessment & Plan Note (Signed)
HEALTHCARE                           GASTROENTEROLOGY OFFICE NOTE   NAME:Evelyn [name], Evelyn [name]                       MRN:          [id number]  DATE:02/07/2006                            DOB:          May 28, 1925    REFERRING PHYSICIAN:  Rosalyn Gess. Norins, MD   REASON FOR REFERRAL:  Dr. Debby Bud asked me to evaluate Evelyn [name] in  consultation regarding an elevated liver test, fatigue.   HISTORY OF PRESENT ILLNESS:  Evelyn [name] is a very pleasant 77 year old  woman who was well until approximately three months ago.  Starting around  that time, she has developed gradually worsening fatigue, malaise.  She had  liver tests done in June that were all completely normal.  CBC at the time  was also normal.  She was found to have blood in her urine and a urinary  tract infection.  She was treated with I believe Ciprofloxacin at that time.  She had followup appointment with a urologist several weeks ago.  He did a  CT scan.  CT scan was likely without IV contrast and did not show any  abnormality in her hepato-biliary tract.  She was told at that point that  the urinary tract infection was gone and not to worry about it.  She was  started on Lipitor approximately 3 to 4 weeks ago.  She represented to  medical attention yesterday or the day before with even worsening fatigue.  She had a fever to 102.  She went to the emergency room.  Another urinary  tract infection was placed back on Ciprofloxacin.  Liver tests done in the  emergency room showed now that her liver tests were all abnormal,  specifically with a total bilirubin of 3.6, an alk phos of 393, AST of 178,  ALT of 156.   She has had no specific right upper quadrant tenderness.  She has never had  jaundice before.  Labs were sent for more tests including acute hepatitis  panel, EBV line testing.   REVIEW OF SYSTEMS:  Noted for stable weight, is otherwise essentially normal  other than summarized  above.   PAST MEDICAL HISTORY:  Elevated cholesterol, started on Lipitor 3 to 4 weeks  ago; depression, sinusitis, status post hysterectomy.   CURRENT MEDICATIONS:  Multivitamin, Claritin.   ALLERGIES:  NO KNOWN DRUG ALLERGIES.   SOCIAL HISTORY:  Widowed with two sons.  One of her sons is with her today,  non-smoker, drinks very little alcohol.   FAMILY HISTORY:  Colon polyps do run in her family.  She has had a  colonoscopy, although I did not focus on this today, and I am not sure when  her last colonoscopy was.   PHYSICAL EXAMINATION:  VITAL SIGNS:  5 foot 5 inches, 124 pounds, blood  pressure 100/84.  CONSTITUTIONAL:  Frail-appearing.  NEUROLOGIC:  Alert, oriented x3.  HEENT:  Eyes:  Extraocular movements intact.  Mouth:  Oropharynx moist, no  lesions.  NECK:  Supple, no lymphadenopathy.  CARDIOVASCULAR:  Heart regular rate and rhythm.  LUNGS:  Clear to auscultation bilaterally.  ABDOMEN:  Soft, nontender, nondistended.  EXTREMITIES:  No lower extremity edema.  SKIN:  No rashes or lesions on visible extremities.   ASSESSMENT/PLAN:  A 77 year old woman with 3 months of fatigue, malaise,  recurrent urinary tract infection, abnormal liver test.   First, she does seem to have a recurrent urinary tract infection.  I am not  sure that this is causing all of her symptoms, although she was put back on  Ciprofloxacin yesterday, should continue the course.  I do think that this  is recurrent, that she should be re-evaluated by her urologist, and that  appointment is already made for next week.   Her liver tests have bumped since June.  This may from the interval starting  of a Statin for her high cholesterol.  The Statin was stopped yesterday.  I  think though that we should get very good imaging studies of her  hepatobiliary tract, so I have arranged for her to have a CT scan,  pancreatic protocol with IV and oral contrast done today.  I have also added  some extra labs to her  blood work including coags, ANA and an AMA.  Note  that acute hepatitis panel is already pending.   If the CT scan shows no evidence for obstruction, I think simply seeing how  her liver tests trend over the next week is reasonable.  Obviously, if there  is pancreatic neoplasm or obstruction of her bile duct, we will address  those as appropriate.                                   Rachael Fee, MD   DPJ/MedQ  DD:  02/07/2006  DT:  02/07/2006  Job #:  [id number]   cc:   Rosalyn Gess. Norins, MD

## 2010-11-06 NOTE — Assessment & Plan Note (Signed)
Evelyn [name]                           GASTROENTEROLOGY OFFICE NOTE   NAME:[name], [name]                       MRN:          [id number]  DATE:02/15/2006                            DOB:          [birth year]    PRIMARY CARE PHYSICIAN:  Dr. Illene Regulus.   GI PROBLEM LIST:  Abnormal liver tests.  Max total bili 3.6, alk phos 393,  AST 179, ALT 156.  Occurred shortly after beginning Lipitor.  Repeat liver  tests after holding Lipitor for 10 days greatly improved.   INTERVAL HISTORY:  I last saw Evelyn [name] one week ago.  She had a CT scan  done shortly after the visit showing no pancreatic disease, no specific  liver disease.  I suspected that her mild hepatitis was likely a drug  reaction from the Lipitor.  She returns today feeling overall much better.  She did have a urinary tract infection that was treated.  She saw her  urologist today, and he said that she is clear of infection.  Her fatigue is  still present, but she overall feels improved.  Liver test done just prior  to this visit showed a great improvement with total bilirubin of 1.3, alk  phos 322, AST 38, and ALT of 42.   CURRENT MEDICATIONS:  1. Multivitamin.  2. Claritin.   PHYSICAL EXAMINATION:  VITAL SIGNS:  Weight 119 pounds, blood pressure  108/64, pulse 78.  CONSTITUTIONAL:  Generally well-appearing.  NEUROLOGIC:  Alert and oriented times three.  LUNGS:  Clear to auscultation bilaterally.  HEART:  Regular rhythm.  ABDOMEN:  Soft, nontender, nondistended.  Normal bowel sounds.   ASSESSMENT/PLAN:  A 77 year old woman with recent statin-induced LFT  abnormalities.  Her liver tests have greatly improved since the statin only  about 10 days.  I suspect that was indeed the case for liver test  abnormalities.  I have arranged for her to have one more set of liver tests  done approximately one month from now.  I cannot explain her overall fatigue  and initially brought her to  medical attention, but I suspect her urinary  tract infections had something to do with it.  She may have had a mild  infection for some time and it did not present in the usual fashion as is  known to be the case in patients who are advanced in age.  Although she is  quite a healthy vibrant 77 year old, many patients do prove their age in  the setting of acute illness such as infections.  She certainly feels better  now, although she does still have some fatigue.  She will return to see me on an as needed basis, and I will communicate the  results of her liver tests in one month with her.                                   Rachael Fee, MD   DPJ/MedQ  DD:  02/15/2006  DT:  02/16/2006  Job #:  [id number]  cc:   Evelyn Gess. Norins, MD

## 2011-02-15 ENCOUNTER — Other Ambulatory Visit: Payer: Self-pay | Admitting: Internal Medicine

## 2011-03-23 LAB — COMPREHENSIVE METABOLIC PANEL
ALT: 15
AST: 26
Albumin: 3.7
Alkaline Phosphatase: 58
CO2: 24
Calcium: 9.5
Chloride: 102
Creatinine, Ser: 0.87
GFR calc Af Amer: >60
Total Bilirubin: 0.7

## 2011-03-23 LAB — SEDIMENTATION RATE: Sed Rate: 19

## 2011-03-23 LAB — CBC
HCT: 37.4
Hemoglobin: 12.4
MCHC: 33.1
MCV: 92.2

## 2011-03-23 LAB — AMYLASE: Amylase: 97

## 2011-03-23 LAB — TSH: TSH: 0.816

## 2011-03-23 LAB — LIPASE, BLOOD: Lipase: 27

## 2011-03-24 ENCOUNTER — Ambulatory Visit (INDEPENDENT_AMBULATORY_CARE_PROVIDER_SITE_OTHER): Payer: Medicare Other | Admitting: Internal Medicine

## 2011-03-24 ENCOUNTER — Encounter: Payer: Self-pay | Admitting: Internal Medicine

## 2011-03-24 DIAGNOSIS — G4700 Insomnia, unspecified: Secondary | ICD-10-CM

## 2011-03-24 DIAGNOSIS — F329 Major depressive disorder, single episode, unspecified: Secondary | ICD-10-CM

## 2011-03-24 DIAGNOSIS — E782 Mixed hyperlipidemia: Secondary | ICD-10-CM

## 2011-03-24 DIAGNOSIS — Z1231 Encounter for screening mammogram for malignant neoplasm of breast: Secondary | ICD-10-CM

## 2011-03-24 DIAGNOSIS — F3289 Other specified depressive episodes: Secondary | ICD-10-CM

## 2011-03-24 DIAGNOSIS — E785 Hyperlipidemia, unspecified: Secondary | ICD-10-CM

## 2011-03-24 DIAGNOSIS — R634 Abnormal weight loss: Secondary | ICD-10-CM

## 2011-03-24 MED ORDER — ZOLPIDEM TARTRATE 10 MG PO TABS: 10.0000 mg | ORAL_TABLET | Freq: Every evening | ORAL | Status: DC | PRN

## 2011-03-24 MED ORDER — FLUOXETINE HCL 20 MG PO CAPS: 20.0000 mg | ORAL_CAPSULE | Freq: Every day | ORAL | Status: DC

## 2011-03-24 MED ORDER — TRAZODONE HCL 50 MG PO TABS: 50.0000 mg | ORAL_TABLET | Freq: Every day | ORAL | Status: DC

## 2011-03-24 NOTE — Assessment & Plan Note (Signed)
This has stabilized.  

## 2011-03-24 NOTE — Progress Notes (Signed)
  Subjective:    Patient ID: Evelyn [name], female    DOB: September 10, 1924, 77 y.o.   MRN: [id number]  HPI She returns for f/up and tells me that she is feeling well, she occasionally has abd pain but she is aware that this is part of her depression symptoms and she does not want to do any further testing or treatment.   Review of Systems  Constitutional: Negative for fever, chills, diaphoresis, activity change, appetite change, fatigue and unexpected weight change.  HENT: Negative.   Eyes: Negative.   Respiratory: Negative for apnea, cough, choking, chest tightness, shortness of breath, wheezing and stridor.   Cardiovascular: Negative for chest pain, palpitations and leg swelling.  Gastrointestinal: Positive for abdominal pain (chronic, intermittent, unchanged) and constipation. Negative for nausea, diarrhea, blood in stool, abdominal distention, anal bleeding and rectal pain.  Genitourinary: Negative for dysuria, urgency, frequency, hematuria, flank pain, decreased urine volume, vaginal bleeding, vaginal discharge, enuresis, difficulty urinating, genital sores, vaginal pain, menstrual problem, pelvic pain and dyspareunia.  Musculoskeletal: Negative.   Skin: Negative.   Neurological: Negative for dizziness, tremors, seizures, syncope, facial asymmetry, speech difficulty, weakness, light-headedness, numbness and headaches.  Hematological: Negative for adenopathy. Does not bruise/bleed easily.  Psychiatric/Behavioral: Positive for sleep disturbance and dysphoric mood. Negative for suicidal ideas, hallucinations, behavioral problems, confusion, self-injury, decreased concentration and agitation. The patient is not nervous/anxious and is not hyperactive.        Objective:   Physical Exam  Vitals reviewed. Constitutional: She is oriented to person, place, and time. She appears well-developed and well-nourished. No distress.  HENT:  Mouth/Throat: Oropharynx is clear and moist. No oropharyngeal  exudate.  Eyes: Conjunctivae are normal. Right eye exhibits no discharge. Left eye exhibits no discharge. No scleral icterus.  Neck: Normal range of motion. Neck supple. No JVD present. No tracheal deviation present. No thyromegaly present.  Cardiovascular: Normal rate, regular rhythm, normal heart sounds and intact distal pulses.  Exam reveals no gallop and no friction rub.   No murmur heard. Pulmonary/Chest: Effort normal and breath sounds normal. No stridor. No respiratory distress. She has no wheezes. She has no rales. She exhibits no tenderness.  Abdominal: Soft. Bowel sounds are normal. She exhibits no distension. There is no tenderness. There is no rebound and no guarding.  Musculoskeletal: Normal range of motion. She exhibits no edema and no tenderness.  Lymphadenopathy:    She has no cervical adenopathy.  Neurological: She is oriented to person, place, and time. She displays normal reflexes. She exhibits normal muscle tone. Coordination normal.  Skin: Skin is warm and dry. No rash noted. She is not diaphoretic. No erythema. No pallor.  Psychiatric: She has a normal mood and affect. Her behavior is normal. Judgment and thought content normal.          Lab Results  Component Value Date   WBC 6.0 08/13/2010   HGB 12.7 08/13/2010   HCT 37.2 08/13/2010   PLT 183.0 08/13/2010   CHOL 236* 08/13/2010   TRIG 106.0 08/13/2010   HDL 60.70 08/13/2010   LDLDIRECT 164.3 08/13/2010   ALT 15 08/13/2010   AST 30 08/13/2010   NA 140 08/13/2010   K 5.3* 08/13/2010   CL 107 08/13/2010   CREATININE 1.1 08/13/2010   BUN 22 08/13/2010   CO2 26 08/13/2010   TSH 0.72 08/13/2010   INR 1.1* 08/13/2010   Assessment & Plan:

## 2011-03-24 NOTE — Assessment & Plan Note (Signed)
Continue current meds 

## 2011-03-24 NOTE — Assessment & Plan Note (Signed)
She does not want to treat this 

## 2011-03-24 NOTE — Assessment & Plan Note (Signed)
No changes

## 2011-03-24 NOTE — Patient Instructions (Signed)

## 2012-01-13 ENCOUNTER — Telehealth: Payer: Self-pay

## 2012-01-13 DIAGNOSIS — G4700 Insomnia, unspecified: Secondary | ICD-10-CM

## 2012-01-13 DIAGNOSIS — F329 Major depressive disorder, single episode, unspecified: Secondary | ICD-10-CM

## 2012-01-13 MED ORDER — ZOLPIDEM TARTRATE 10 MG PO TABS: 10.0000 mg | ORAL_TABLET | Freq: Every evening | ORAL | Status: DC | PRN

## 2012-01-13 NOTE — Telephone Encounter (Signed)
Pharmacy notified.

## 2012-01-13 NOTE — Telephone Encounter (Signed)
Please advise if ok to refill zolpidem 10mg  tabs, last filled 04/03/11 #[id number]. Patient last seen 03/24/11 with no return appt indicated.

## 2012-01-13 NOTE — Telephone Encounter (Signed)
One month only please, pls ask her to come in soon

## 2012-01-20 ENCOUNTER — Emergency Department (HOSPITAL_COMMUNITY)
Admission: EM | Admit: 2012-01-20 | Discharge: 2012-01-20 | Disposition: A | Discharge status: Home / Self Care | Payer: Medicare Other | Attending: Emergency Medicine | Admitting: Emergency Medicine

## 2012-01-20 ENCOUNTER — Encounter (HOSPITAL_COMMUNITY): Payer: Self-pay | Admitting: Emergency Medicine

## 2012-01-20 DIAGNOSIS — IMO0002 Reserved for concepts with insufficient information to code with codable children: Secondary | ICD-10-CM | POA: Diagnosis not present

## 2012-01-20 DIAGNOSIS — E785 Hyperlipidemia, unspecified: Secondary | ICD-10-CM | POA: Insufficient documentation

## 2012-01-20 DIAGNOSIS — L0291 Cutaneous abscess, unspecified: Secondary | ICD-10-CM

## 2012-01-20 LAB — GLUCOSE, CAPILLARY: Glucose-Capillary: 96 mg/dL (ref 70–99)

## 2012-01-20 MED ORDER — SULFAMETHOXAZOLE-TRIMETHOPRIM 800-160 MG PO TABS: 1.0000 | ORAL_TABLET | Freq: Two times a day (BID) | ORAL | Status: AC

## 2012-01-20 NOTE — Progress Notes (Signed)
78 yo lady had I&D of abscess of right shoulder skin.

## 2012-01-20 NOTE — ED Notes (Signed)
Pt presents with abscess to right shoulder area. Pt reports started out as small bump couple weeks ago. Today while in shower noticed Fluid coming out. Large Red mark with Yellow colored drainage noted.

## 2012-01-20 NOTE — ED Notes (Signed)
Reports abscess started to right shoulder (right underneath bra strap) 2-3 weeks ago progressively worsening. Today started draining & became painful. Denies fever. Reports had similar wound to same place 6 yrs ago.

## 2012-01-20 NOTE — ED Provider Notes (Signed)
History     CSN: [id number]  Arrival date & time 01/20/12  4098   First MD Initiated Contact with Patient 01/20/12 1059      Chief Complaint  Patient presents with  . Abscess    Right shoulder    (Consider location/radiation/quality/duration/timing/severity/associated sxs/prior treatment) HPI  Patient to the ER with complaints of abscess to right shoulder. Developed a pimple a couple of weeks ago and today noticed fluid of puss and blood coming out of it. She says its mildly tender. No fevers, nausea, vomiting, chills or weakness. NAD/VSS.  Past Medical History  Diagnosis Date  . Hyperlipidemia   . Hepatitis, unspecified     statin related, resolved quickly  . H/O: hematuria   . Congenital blindness     right eye  . Chronic cystitis     Past Surgical History  Procedure Date  . Abdominal hysterectomy     vaginal  . Eye surgery     100% visual loss  . Bladder suspension     A-P    Family History  Problem Relation Age of Onset  . Cancer Neg Hx     Colon cancer    History  Substance Use Topics  . Smoking status: Never Smoker   . Smokeless tobacco: Never Used  . Alcohol Use: No    OB History    Grav Para Term Preterm Abortions TAB SAB Ect Mult Living                  Review of Systems   HEENT: denies blurry vision or change in hearing PULMONARY: Denies difficulty breathing and SOB CARDIAC: denies chest pain or heart palpitations MUSCULOSKELETAL:  denies being unable to ambulate ABDOMEN AL: denies abdominal pain GU: denies loss of bowel or urinary control NEURO: denies numbness and tingling in extremities SKIN: + abscess to right shoulder PSYCH: patient denies anxiety or depression. NECK: Pt denies having neck pain     Allergies  Review of patient's allergies indicates no known allergies.  Home Medications   Current Outpatient Rx  Name Route Sig Dispense Refill  . FLUOXETINE HCL 20 MG PO CAPS Oral Take 1 capsule (20 mg total) by mouth  daily. 30 capsule 11  . FISH OIL 1000 MG PO CAPS Oral Take 1 each by mouth daily.     . TRAZODONE HCL 50 MG PO TABS Oral Take 1 tablet (50 mg total) by mouth at bedtime. 30 tablet 11  . ZOLPIDEM TARTRATE 10 MG PO TABS Oral Take 2.5-5 mg by mouth at bedtime as needed. For sleep    . SULFAMETHOXAZOLE-TRIMETHOPRIM 800-160 MG PO TABS Oral Take 1 tablet by mouth every 12 (twelve) hours. 6 tablet 0    BP 118/76  Pulse 79  Temp 97.6 F (36.4 C) (Oral)  Resp 16  SpO2 99%  Physical Exam  Nursing note and vitals reviewed. Constitutional: She appears well-developed and well-nourished. No distress.  HENT:  Head: Normocephalic and atraumatic.  Eyes: Pupils are equal, round, and reactive to light.  Neck: Normal range of motion. Neck supple.  Cardiovascular: Normal rate and regular rhythm.   Pulmonary/Chest: Effort normal.    Abdominal: Soft.  Neurological: She is alert.  Skin: Skin is warm and dry.    ED Course  Procedures (including critical care time)   Labs Reviewed  GLUCOSE, CAPILLARY  WOUND CULTURE   No results found.   1. Abscess       MDM  INCISION AND DRAINAGE Performed by: Marlon Pel  G Consent: Verbal consent obtained. Risks and benefits: risks, benefits and alternatives were discussed Type: abscess  Body area: right shoulder  Anesthesia: None  Local anesthetic: NA  Anesthetic total: NA  Complexity: complex Blunt dissection to break up loculations  Drainage: purulent  Drainage amount: large  Packing material: not packed  Patient tolerance: Patient tolerated the procedure well with no immediate complications.    Pt given Bactrim for 3 days. Declines pain medication  Pt has been advised of the symptoms that warrant their return to the ED. Patient has voiced understanding and has agreed to follow-up with the PCP or specialist.        Dorthula Matas, PA 01/20/12 1133

## 2012-01-20 NOTE — ED Notes (Signed)
I&D tray set up at bedside.

## 2012-01-21 NOTE — ED Provider Notes (Signed)
Medical screening examination/treatment/procedure(s) were conducted as a shared visit with non-physician practitioner(s) and myself.  I personally evaluated the patient during the encounter Osvaldo Human, MD Physician Signed  Progress Notes 01/20/2012 11:51 AM   78 yo lady had I&D of abscess of right shoulder skin.       Carleene Cooper III, MD 01/21/12 1248

## 2012-01-22 LAB — WOUND CULTURE: Gram Stain: NONE SEEN

## 2012-03-14 ENCOUNTER — Other Ambulatory Visit: Payer: Self-pay | Admitting: Internal Medicine

## 2012-03-22 DIAGNOSIS — Z23 Encounter for immunization: Secondary | ICD-10-CM | POA: Diagnosis not present

## 2012-03-29 DIAGNOSIS — H52209 Unspecified astigmatism, unspecified eye: Secondary | ICD-10-CM | POA: Diagnosis not present

## 2012-03-29 DIAGNOSIS — Z961 Presence of intraocular lens: Secondary | ICD-10-CM | POA: Diagnosis not present

## 2012-03-29 DIAGNOSIS — H31009 Unspecified chorioretinal scars, unspecified eye: Secondary | ICD-10-CM | POA: Diagnosis not present

## 2012-03-31 ENCOUNTER — Encounter: Payer: Self-pay | Admitting: Internal Medicine

## 2012-03-31 ENCOUNTER — Ambulatory Visit (INDEPENDENT_AMBULATORY_CARE_PROVIDER_SITE_OTHER): Payer: Medicare Other | Admitting: Internal Medicine

## 2012-03-31 ENCOUNTER — Other Ambulatory Visit (INDEPENDENT_AMBULATORY_CARE_PROVIDER_SITE_OTHER): Payer: Medicare Other

## 2012-03-31 VITALS — BP 118/70 | HR 82 | Temp 97.5°F | Resp 16 | Wt 114.2 lb

## 2012-03-31 DIAGNOSIS — R7309 Other abnormal glucose: Secondary | ICD-10-CM | POA: Diagnosis not present

## 2012-03-31 DIAGNOSIS — G4700 Insomnia, unspecified: Secondary | ICD-10-CM

## 2012-03-31 DIAGNOSIS — K5900 Constipation, unspecified: Secondary | ICD-10-CM

## 2012-03-31 DIAGNOSIS — E782 Mixed hyperlipidemia: Secondary | ICD-10-CM | POA: Diagnosis not present

## 2012-03-31 DIAGNOSIS — K5904 Chronic idiopathic constipation: Secondary | ICD-10-CM | POA: Insufficient documentation

## 2012-03-31 DIAGNOSIS — R739 Hyperglycemia, unspecified: Secondary | ICD-10-CM | POA: Insufficient documentation

## 2012-03-31 DIAGNOSIS — H6123 Impacted cerumen, bilateral: Secondary | ICD-10-CM | POA: Insufficient documentation

## 2012-03-31 DIAGNOSIS — H6120 Impacted cerumen, unspecified ear: Secondary | ICD-10-CM | POA: Diagnosis not present

## 2012-03-31 DIAGNOSIS — F329 Major depressive disorder, single episode, unspecified: Secondary | ICD-10-CM

## 2012-03-31 LAB — CBC WITH DIFFERENTIAL/PLATELET
Basophils Absolute: 0 10*3/uL (ref 0.0–0.1)
Eosinophils Absolute: 0.1 10*3/uL (ref 0.0–0.7)
HCT: 36.2 % (ref 36.0–46.0)
Hemoglobin: 11.8 g/dL — ABNORMAL LOW (ref 12.0–15.0)
Lymphs Abs: 1.3 10*3/uL (ref 0.7–4.0)
MCHC: 32.6 g/dL (ref 30.0–36.0)
MCV: 93.3 fl (ref 78.0–100.0)
Monocytes Absolute: 0.6 10*3/uL (ref 0.1–1.0)
Monocytes Relative: 13.6 % — ABNORMAL HIGH (ref 3.0–12.0)
Neutro Abs: 2.6 10*3/uL (ref 1.4–7.7)
Platelets: 220 10*3/uL (ref 150.0–400.0)
RDW: 13.3 % (ref 11.5–14.6)

## 2012-03-31 LAB — HEMOGLOBIN A1C: Hgb A1c MFr Bld: 5.7 % (ref 4.6–6.5)

## 2012-03-31 LAB — COMPREHENSIVE METABOLIC PANEL
ALT: 18 U/L (ref 0–35)
AST: 34 U/L (ref 0–37)
CO2: 26 mEq/L (ref 19–32)
Creatinine, Ser: 0.9 mg/dL (ref 0.4–1.2)
GFR: 60.1 mL/min (ref >60.00)
Sodium: 137 mEq/L (ref 135–145)
Total Bilirubin: 0.6 mg/dL (ref 0.3–1.2)
Total Protein: 6.8 g/dL (ref 6.0–8.3)

## 2012-03-31 LAB — LIPID PANEL
Cholesterol: 210 mg/dL — ABNORMAL HIGH (ref 0–200)
VLDL: 14.6 mg/dL (ref 0.0–40.0)

## 2012-03-31 LAB — TSH: TSH: 0.89 u[IU]/mL (ref 0.35–5.50)

## 2012-03-31 MED ORDER — TRAZODONE HCL 50 MG PO TABS: 50.0000 mg | ORAL_TABLET | Freq: Every day | ORAL | Status: DC

## 2012-03-31 MED ORDER — FLUOXETINE HCL 20 MG PO CAPS: 20.0000 mg | ORAL_CAPSULE | Freq: Every day | ORAL | Status: DC

## 2012-03-31 MED ORDER — ZOLPIDEM TARTRATE 10 MG PO TABS: 2.5000 mg | ORAL_TABLET | Freq: Every evening | ORAL | Status: DC | PRN

## 2012-03-31 MED ORDER — LINACLOTIDE 145 MCG PO CAPS: 145.0000 ug | ORAL_CAPSULE | Freq: Every day | ORAL | Status: DC

## 2012-03-31 NOTE — Progress Notes (Signed)
Subjective:    Patient ID: Evelyn [name], female    DOB: 05-22-1925, 78 y.o.   MRN: [id number]  Constipation This is a chronic problem. Episode onset: for 4 months. The problem is unchanged. Her stool frequency is 1 time per day. The stool is described as formed and firm. The patient is on a high fiber diet. She does not exercise regularly. There has been adequate water intake. Pertinent negatives include no abdominal pain, anorexia, back pain, bloating, diarrhea, difficulty urinating, fecal incontinence, fever, flatus, hematochezia, hemorrhoids, melena, nausea, rectal pain, vomiting or weight loss. Treatments tried: miralax. The treatment provided no relief.      Review of Systems  Constitutional: Negative for fever, chills, weight loss, diaphoresis, activity change, appetite change, fatigue and unexpected weight change.  HENT: Positive for hearing loss and ear pain. Negative for nosebleeds, congestion, facial swelling, rhinorrhea, neck pain, neck stiffness and ear discharge.   Eyes: Negative.   Respiratory: Negative.   Cardiovascular: Negative.   Gastrointestinal: Positive for constipation. Negative for nausea, vomiting, abdominal pain, diarrhea, blood in stool, melena, hematochezia, abdominal distention, anal bleeding, rectal pain, bloating, anorexia, flatus and hemorrhoids.  Genitourinary: Negative.  Negative for difficulty urinating.  Musculoskeletal: Negative.  Negative for back pain.  Skin: Negative.   Neurological: Negative.   Hematological: Negative for adenopathy. Does not bruise/bleed easily.  Psychiatric/Behavioral: Positive for disturbed wake/sleep cycle (dfa). Negative for suicidal ideas, hallucinations, behavioral problems, confusion, self-injury, dysphoric mood, decreased concentration and agitation. The patient is not nervous/anxious and is not hyperactive.        Objective:   Physical Exam  Vitals reviewed. Constitutional: She is oriented to person, place, and time.  She appears well-developed and well-nourished.  Non-toxic appearance. She does not have a sickly appearance. She does not appear ill. No distress.  HENT:  Head: Normocephalic and atraumatic.  Right Ear: Hearing, tympanic membrane and external ear normal. No drainage, swelling or tenderness. A foreign body (cerumen impaction) is present. No decreased hearing is noted.  Left Ear: Hearing, tympanic membrane and external ear normal. No drainage, swelling or tenderness. A foreign body (cerumen impaction) is present.  Mouth/Throat: Oropharynx is clear and moist. No oropharyngeal exudate.  Eyes: Conjunctivae normal are normal. Right eye exhibits no discharge. Left eye exhibits no discharge. No scleral icterus.  Neck: Normal range of motion. Neck supple. No JVD present. No tracheal deviation present. No thyromegaly present.  Cardiovascular: Normal rate, regular rhythm, normal heart sounds and intact distal pulses.  Exam reveals no gallop and no friction rub.   No murmur heard. Pulmonary/Chest: Effort normal and breath sounds normal. No stridor. No respiratory distress. She has no wheezes. She has no rales. She exhibits no tenderness.  Abdominal: Soft. Bowel sounds are normal. She exhibits no distension and no mass. There is no tenderness. There is no rebound and no guarding.  Musculoskeletal: Normal range of motion. She exhibits no edema and no tenderness.  Lymphadenopathy:    She has no cervical adenopathy.  Neurological: She is oriented to person, place, and time.  Skin: Skin is warm and dry. No rash noted. She is not diaphoretic. No erythema. No pallor.  Psychiatric: She has a normal mood and affect. Her behavior is normal. Judgment and thought content normal.      Lab Results  Component Value Date   WBC 6.0 08/13/2010   HGB 12.7 08/13/2010   HCT 37.2 08/13/2010   PLT 183.0 08/13/2010   GLUCOSE 109* 08/13/2010   CHOL 236* 08/13/2010  TRIG 106.0 08/13/2010   HDL 60.70 08/13/2010   LDLDIRECT 164.3  08/13/2010   ALT 15 08/13/2010   AST 30 08/13/2010   NA 140 08/13/2010   K 5.3* 08/13/2010   CL 107 08/13/2010   CREATININE 1.1 08/13/2010   BUN 22 08/13/2010   CO2 26 08/13/2010   TSH 0.72 08/13/2010   INR 1.1* 08/13/2010      Assessment & Plan:

## 2012-03-31 NOTE — Assessment & Plan Note (Signed)
She will use murine ear wax removal kits to prevent his from happening in the future

## 2012-03-31 NOTE — Assessment & Plan Note (Signed)
She is doing well on her current regimen 

## 2012-03-31 NOTE — Assessment & Plan Note (Signed)
FLP today 

## 2012-03-31 NOTE — Patient Instructions (Signed)

## 2012-03-31 NOTE — Assessment & Plan Note (Signed)
I will check her labs to look for secondary causes, she will try linzess

## 2012-03-31 NOTE — Assessment & Plan Note (Signed)
I will check her a1c today to see if she has DM II 

## 2012-12-19 DIAGNOSIS — M722 Plantar fascial fibromatosis: Secondary | ICD-10-CM | POA: Diagnosis not present

## 2013-01-16 DIAGNOSIS — M722 Plantar fascial fibromatosis: Secondary | ICD-10-CM | POA: Diagnosis not present

## 2013-02-13 DIAGNOSIS — M722 Plantar fascial fibromatosis: Secondary | ICD-10-CM | POA: Diagnosis not present

## 2013-03-27 DIAGNOSIS — Z23 Encounter for immunization: Secondary | ICD-10-CM | POA: Diagnosis not present

## 2013-04-03 DIAGNOSIS — H52209 Unspecified astigmatism, unspecified eye: Secondary | ICD-10-CM | POA: Diagnosis not present

## 2013-04-03 DIAGNOSIS — Z961 Presence of intraocular lens: Secondary | ICD-10-CM | POA: Diagnosis not present

## 2013-04-03 DIAGNOSIS — H31009 Unspecified chorioretinal scars, unspecified eye: Secondary | ICD-10-CM | POA: Diagnosis not present

## 2013-04-05 DIAGNOSIS — L819 Disorder of pigmentation, unspecified: Secondary | ICD-10-CM | POA: Diagnosis not present

## 2013-04-05 DIAGNOSIS — L821 Other seborrheic keratosis: Secondary | ICD-10-CM | POA: Diagnosis not present

## 2013-04-05 DIAGNOSIS — L570 Actinic keratosis: Secondary | ICD-10-CM | POA: Diagnosis not present

## 2013-04-05 DIAGNOSIS — D692 Other nonthrombocytopenic purpura: Secondary | ICD-10-CM | POA: Diagnosis not present

## 2013-04-05 DIAGNOSIS — Z85828 Personal history of other malignant neoplasm of skin: Secondary | ICD-10-CM | POA: Diagnosis not present

## 2013-05-01 ENCOUNTER — Ambulatory Visit (INDEPENDENT_AMBULATORY_CARE_PROVIDER_SITE_OTHER): Payer: Medicare Other | Admitting: Podiatry

## 2013-05-01 ENCOUNTER — Encounter: Payer: Self-pay | Admitting: Podiatry

## 2013-05-01 VITALS — BP 116/64 | HR 73 | Resp 16 | Ht 65.0 in | Wt 114.0 lb

## 2013-05-01 DIAGNOSIS — M722 Plantar fascial fibromatosis: Secondary | ICD-10-CM

## 2013-05-01 NOTE — Progress Notes (Signed)
John presents today for followup of her plantar fasciitis states that she's 100% better than what she was before she got her orthotics.  Objective: Vital signs are stable she is alert and oriented x3 pulses remain palpable bilateral. Evaluation of the orthotics demonstrates well where on the orthotics. I did discussed appropriate shoe gear with her.  Assessment: plantar fasciitis resolving.  Plan: Continue use of the orthotics a regular basis and purchased a pair shoes to wear inside.

## 2013-05-03 ENCOUNTER — Ambulatory Visit (INDEPENDENT_AMBULATORY_CARE_PROVIDER_SITE_OTHER): Payer: Medicare Other | Admitting: Internal Medicine

## 2013-05-03 ENCOUNTER — Encounter: Payer: Self-pay | Admitting: Internal Medicine

## 2013-05-03 ENCOUNTER — Other Ambulatory Visit (INDEPENDENT_AMBULATORY_CARE_PROVIDER_SITE_OTHER): Payer: Medicare Other

## 2013-05-03 VITALS — BP 116/70 | HR 65 | Temp 98.2°F | Resp 16 | Ht 65.0 in | Wt 116.0 lb

## 2013-05-03 DIAGNOSIS — F329 Major depressive disorder, single episode, unspecified: Secondary | ICD-10-CM

## 2013-05-03 DIAGNOSIS — R7309 Other abnormal glucose: Secondary | ICD-10-CM

## 2013-05-03 DIAGNOSIS — Z23 Encounter for immunization: Secondary | ICD-10-CM | POA: Diagnosis not present

## 2013-05-03 DIAGNOSIS — E782 Mixed hyperlipidemia: Secondary | ICD-10-CM

## 2013-05-03 DIAGNOSIS — F3289 Other specified depressive episodes: Secondary | ICD-10-CM

## 2013-05-03 DIAGNOSIS — G4700 Insomnia, unspecified: Secondary | ICD-10-CM

## 2013-05-03 DIAGNOSIS — K5900 Constipation, unspecified: Secondary | ICD-10-CM | POA: Diagnosis not present

## 2013-05-03 DIAGNOSIS — Z0000 Encounter for general adult medical examination without abnormal findings: Secondary | ICD-10-CM | POA: Diagnosis not present

## 2013-05-03 LAB — CBC WITH DIFFERENTIAL/PLATELET
Basophils Absolute: 0.1 10*3/uL (ref 0.0–0.1)
Eosinophils Absolute: 0.2 10*3/uL (ref 0.0–0.7)
Hemoglobin: 12.5 g/dL (ref 12.0–15.0)
Lymphocytes Relative: 19.7 % (ref 12.0–46.0)
MCHC: 33.4 g/dL (ref 30.0–36.0)
MCV: 94.3 fl (ref 78.0–100.0)
Monocytes Absolute: 0.8 10*3/uL (ref 0.1–1.0)
Monocytes Relative: 14.1 % — ABNORMAL HIGH (ref 3.0–12.0)
Neutro Abs: 3.4 10*3/uL (ref 1.4–7.7)
RDW: 12.8 % (ref 11.5–14.6)

## 2013-05-03 LAB — LIPID PANEL
Cholesterol: 225 mg/dL — ABNORMAL HIGH (ref 0–200)
HDL: 63.6 mg/dL (ref >39.00)
Total CHOL/HDL Ratio: 4

## 2013-05-03 LAB — COMPREHENSIVE METABOLIC PANEL
ALT: 23 U/L (ref 0–35)
AST: 34 U/L (ref 0–37)
Albumin: 3.6 g/dL (ref 3.5–5.2)
Alkaline Phosphatase: 103 U/L (ref 39–117)
BUN: 17 mg/dL (ref 6–23)
Calcium: 9.5 mg/dL (ref 8.4–10.5)
Chloride: 106 mEq/L (ref 96–112)
Creatinine, Ser: 1 mg/dL (ref 0.4–1.2)
Potassium: 5.7 mEq/L — ABNORMAL HIGH (ref 3.5–5.1)
Sodium: 139 mEq/L (ref 135–145)
Total Bilirubin: 0.6 mg/dL (ref 0.3–1.2)

## 2013-05-03 LAB — TSH: TSH: 1.48 u[IU]/mL (ref 0.35–5.50)

## 2013-05-03 LAB — HM DEXA SCAN

## 2013-05-03 MED ORDER — LINACLOTIDE 145 MCG PO CAPS: 145.0000 ug | ORAL_CAPSULE | Freq: Every day | ORAL | Status: DC

## 2013-05-03 MED ORDER — FLUOXETINE HCL 20 MG PO CAPS: 20.0000 mg | ORAL_CAPSULE | Freq: Every day | ORAL | Status: DC

## 2013-05-03 MED ORDER — TRAZODONE HCL 50 MG PO TABS: 50.0000 mg | ORAL_TABLET | Freq: Every day | ORAL | Status: DC

## 2013-05-03 MED ORDER — ZOLPIDEM TARTRATE 10 MG PO TABS: 2.5000 mg | ORAL_TABLET | Freq: Every evening | ORAL | Status: DC | PRN

## 2013-05-03 NOTE — Assessment & Plan Note (Signed)
She is stable on prozac and trazodone

## 2013-05-03 NOTE — Assessment & Plan Note (Signed)
She is doing well on linzess

## 2013-05-03 NOTE — Assessment & Plan Note (Signed)
I will recheck her FLP today She does not want to take a statin 

## 2013-05-03 NOTE — Assessment & Plan Note (Addendum)

## 2013-05-03 NOTE — Assessment & Plan Note (Signed)
She will continue [place] and trazodone as needed

## 2013-05-03 NOTE — Progress Notes (Signed)
Subjective:    Patient ID: Evelyn [name], female    DOB: [birth year], 79 y.o.   MRN: [id number]  Constipation This is a recurrent problem. The current episode started more than 1 year ago. The problem has been gradually improving since onset. Her stool frequency is 1 time per day. The stool is described as formed and firm. The patient is on a high fiber diet. She exercises regularly. There has been adequate water intake. Pertinent negatives include no abdominal pain, anorexia, back pain, bloating, diarrhea, difficulty urinating, fecal incontinence, fever, flatus, hematochezia, hemorrhoids, melena, nausea, rectal pain, vomiting or weight loss. Treatments tried: linzess. The treatment provided significant relief. Her past medical history is significant for psychiatric history.      Review of Systems  Constitutional: Negative.  Negative for fever, chills, weight loss, diaphoresis, activity change, appetite change, fatigue and unexpected weight change.  HENT: Negative.   Eyes: Negative.   Respiratory: Negative.  Negative for cough, chest tightness, shortness of breath, wheezing and stridor.   Cardiovascular: Negative.  Negative for chest pain, palpitations and leg swelling.  Gastrointestinal: Positive for constipation. Negative for nausea, vomiting, abdominal pain, diarrhea, melena, hematochezia, rectal pain, bloating, anorexia, flatus and hemorrhoids.  Endocrine: Negative.   Genitourinary: Negative.  Negative for difficulty urinating.  Musculoskeletal: Negative.  Negative for arthralgias, back pain, gait problem, joint swelling, myalgias, neck pain and neck stiffness.  Allergic/Immunologic: Negative.   Neurological: Negative.  Negative for dizziness.  Hematological: Negative.  Negative for adenopathy. Does not bruise/bleed easily.  Psychiatric/Behavioral: Positive for sleep disturbance and dysphoric mood. Negative for suicidal ideas, hallucinations, behavioral problems, confusion, self-injury,  decreased concentration and agitation. The patient is not nervous/anxious and is not hyperactive.        Objective:   Physical Exam  Vitals reviewed. Constitutional: She is oriented to person, place, and time. She appears well-developed and well-nourished. No distress.  HENT:  Head: Normocephalic and atraumatic.  Mouth/Throat: Oropharynx is clear and moist. No oropharyngeal exudate.  Eyes: Conjunctivae are normal. Right eye exhibits no discharge. Left eye exhibits no discharge. No scleral icterus.  Neck: Normal range of motion. Neck supple. No JVD present. No tracheal deviation present. No thyromegaly present.  Cardiovascular: Normal rate, regular rhythm, normal heart sounds and intact distal pulses.  Exam reveals no gallop and no friction rub.   No murmur heard. Pulmonary/Chest: Effort normal and breath sounds normal. No stridor. No respiratory distress. She has no wheezes. She has no rales. She exhibits no tenderness.  Abdominal: Soft. Bowel sounds are normal. She exhibits no distension and no mass. There is no tenderness. There is no rebound and no guarding.  Musculoskeletal: Normal range of motion. She exhibits no edema and no tenderness.  Lymphadenopathy:    She has no cervical adenopathy.  Neurological: She is oriented to person, place, and time.  Skin: Skin is warm and dry. No rash noted. She is not diaphoretic. No erythema. No pallor.  Psychiatric: Her behavior is normal. Judgment normal. Her mood appears not anxious. Her affect is not angry, not blunt, not labile and not inappropriate. Her speech is not rapid and/or pressured, not delayed, not tangential and not slurred. She is not slowed, not withdrawn and not actively hallucinating. Thought content is paranoid. Cognition and memory are not impaired. She exhibits a depressed mood. She is communicative. She exhibits normal recent memory and normal remote memory. She is attentive.     Lab Results  Component Value Date   WBC 4.7  03/31/2012  HGB 11.8* 03/31/2012   HCT 36.2 03/31/2012   PLT 220.0 03/31/2012   GLUCOSE 74 03/31/2012   CHOL 210* 03/31/2012   TRIG 73.0 03/31/2012   HDL 57.50 03/31/2012   LDLDIRECT 145.7 03/31/2012   ALT 18 03/31/2012   AST 34 03/31/2012   NA 137 03/31/2012   K 5.1 03/31/2012   CL 104 03/31/2012   CREATININE 0.9 03/31/2012   BUN 20 03/31/2012   CO2 26 03/31/2012   TSH 0.89 03/31/2012   INR 1.1* 08/13/2010   HGBA1C 5.7 03/31/2012       Assessment & Plan:

## 2013-05-03 NOTE — Patient Instructions (Signed)
Preventive Care for Adults, Female A healthy lifestyle and preventive care can promote health and wellness. Preventive health guidelines for women include the following key practices.  A routine yearly physical is a good way to check with your caregiver about your health and preventive screening. It is a chance to share any concerns and updates on your health, and to receive a thorough exam.  Visit your dentist for a routine exam and preventive care every 6 months. Brush your teeth twice a day and floss once a day. Good oral hygiene prevents tooth decay and gum disease.  The frequency of eye exams is based on your age, health, family medical history, use of contact lenses, and other factors. Follow your caregiver's recommendations for frequency of eye exams.  Eat a healthy diet. Foods like vegetables, fruits, whole grains, low-fat dairy products, and lean protein foods contain the nutrients you need without too many calories. Decrease your intake of foods high in solid fats, added sugars, and salt. Eat the right amount of calories for you.Get information about a proper diet from your caregiver, if necessary.  Regular physical exercise is one of the most important things you can do for your health. Most adults should get at least 150 minutes of moderate-intensity exercise (any activity that increases your heart rate and causes you to sweat) each week. In addition, most adults need muscle-strengthening exercises on 2 or more days a week.  Maintain a healthy weight. The body mass index (BMI) is a screening tool to identify possible weight problems. It provides an estimate of body fat based on height and weight. Your caregiver can help determine your BMI, and can help you achieve or maintain a healthy weight.For adults 20 years and older:  A BMI below 18.5 is considered underweight.  A BMI of 18.5 to 24.9 is normal.  A BMI of 25 to 29.9 is considered overweight.  A BMI of 30 and above is  considered obese.  Maintain normal blood lipids and cholesterol levels by exercising and minimizing your intake of saturated fat. Eat a balanced diet with plenty of fruit and vegetables. Blood tests for lipids and cholesterol should begin at age 20 and be repeated every 5 years. If your lipid or cholesterol levels are high, you are over 50, or you are at high risk for heart disease, you may need your cholesterol levels checked more frequently.Ongoing high lipid and cholesterol levels should be treated with medicines if diet and exercise are not effective.  If you smoke, find out from your caregiver how to quit. If you do not use tobacco, do not start.  Lung cancer screening is recommended for adults aged 55 80 years who are at high risk for developing lung cancer because of a history of smoking. Yearly low-dose computed tomography (CT) is recommended for people who have at least a 30-pack-year history of smoking and are a current smoker or have quit within the past 15 years. A pack year of smoking is smoking an average of 1 pack of cigarettes a day for 1 year (for example: 1 pack a day for 30 years or 2 packs a day for 15 years). Yearly screening should continue until the smoker has stopped smoking for at least 15 years. Yearly screening should also be stopped for people who develop a health problem that would prevent them from having lung cancer treatment.  If you are pregnant, do not drink alcohol. If you are breastfeeding, be very cautious about drinking alcohol. If you are   not pregnant and choose to drink alcohol, do not exceed 1 drink per day. One drink is considered to be 12 ounces (355 mL) of beer, 5 ounces (148 mL) of wine, or 1.5 ounces (44 mL) of liquor.  Avoid use of street drugs. Do not share needles with anyone. Ask for help if you need support or instructions about stopping the use of drugs.  High blood pressure causes heart disease and increases the risk of stroke. Your blood pressure  should be checked at least every 1 to 2 years. Ongoing high blood pressure should be treated with medicines if weight loss and exercise are not effective.  If you are 55 to 77 years old, ask your caregiver if you should take aspirin to prevent strokes.  Diabetes screening involves taking a blood sample to check your fasting blood sugar level. This should be done once every 3 years, after age 45, if you are within normal weight and without risk factors for diabetes. Testing should be considered at a younger age or be carried out more frequently if you are overweight and have at least 1 risk factor for diabetes.  Breast cancer screening is essential preventive care for women. You should practice "breast self-awareness." This means understanding the normal appearance and feel of your breasts and may include breast self-examination. Any changes detected, no matter how small, should be reported to a caregiver. Women in their 20s and 30s should have a clinical breast exam (CBE) by a caregiver as part of a regular health exam every 1 to 3 years. After age 40, women should have a CBE every year. Starting at age 40, women should consider having a mammography (breast X-ray test) every year. Women who have a family history of breast cancer should talk to their caregiver about genetic screening. Women at a high risk of breast cancer should talk to their caregivers about having magnetic resonance imaging (MRI) and a mammography every year.  Breast cancer gene (BRCA)-related cancer risk assessment is recommended for women who have family members with BRCA-related cancers. BRCA-related cancers include breast, ovarian, tubal, and peritoneal cancers. Having family members with these cancers may be associated with an increased risk for harmful changes (mutations) in the breast cancer genes BRCA1 and BRCA2. Results of the assessment will determine the need for genetic counseling and BRCA1 and BRCA2 testing.  The Pap test is  a screening test for cervical cancer. A Pap test can show cell changes on the cervix that might become cervical cancer if left untreated. A Pap test is a procedure in which cells are obtained and examined from the lower end of the uterus (cervix).  Women should have a Pap test starting at age 21.  Between ages 21 and 29, Pap tests should be repeated every 2 years.  Beginning at age 30, you should have a Pap test every 3 years as long as the past 3 Pap tests have been normal.  Some women have medical problems that increase the chance of getting cervical cancer. Talk to your caregiver about these problems. It is especially important to talk to your caregiver if a new problem develops soon after your last Pap test. In these cases, your caregiver may recommend more frequent screening and Pap tests.  The above recommendations are the same for women who have or have not gotten the vaccine for human papillomavirus (HPV).  If you had a hysterectomy for a problem that was not cancer or a condition that could lead to cancer, then   you no longer need Pap tests. Even if you no longer need a Pap test, a regular exam is a good idea to make sure no other problems are starting.  If you are between ages 65 and 70, and you have had normal Pap tests going back 10 years, you no longer need Pap tests. Even if you no longer need a Pap test, a regular exam is a good idea to make sure no other problems are starting.  If you have had past treatment for cervical cancer or a condition that could lead to cancer, you need Pap tests and screening for cancer for at least 20 years after your treatment.  If Pap tests have been discontinued, risk factors (such as a new sexual partner) need to be reassessed to determine if screening should be resumed.  The HPV test is an additional test that may be used for cervical cancer screening. The HPV test looks for the virus that can cause the cell changes on the cervix. The cells collected  during the Pap test can be tested for HPV. The HPV test could be used to screen women aged 30 years and older, and should be used in women of any age who have unclear Pap test results. After the age of 30, women should have HPV testing at the same frequency as a Pap test.  Colorectal cancer can be detected and often prevented. Most routine colorectal cancer screening begins at the age of 50 and continues through age 75. However, your caregiver may recommend screening at an earlier age if you have risk factors for colon cancer. On a yearly basis, your caregiver may provide home test kits to check for hidden blood in the stool. Use of a small camera at the end of a tube, to directly examine the colon (sigmoidoscopy or colonoscopy), can detect the earliest forms of colorectal cancer. Talk to your caregiver about this at age 50, when routine screening begins. Direct examination of the colon should be repeated every 5 to 10 years through age 75, unless early forms of pre-cancerous polyps or small growths are found.  Hepatitis C blood testing is recommended for all people born from 1945 through 1965 and any individual with known risks for hepatitis C.  Practice safe sex. Use condoms and avoid high-risk sexual practices to reduce the spread of sexually transmitted infections (STIs). STIs include gonorrhea, chlamydia, syphilis, trichomonas, herpes, HPV, and human immunodeficiency virus (HIV). Herpes, HIV, and HPV are viral illnesses that have no cure. They can result in disability, cancer, and death. Sexually active women aged 25 and younger should be checked for chlamydia. Older women with new or multiple partners should also be tested for chlamydia. Testing for other STIs is recommended if you are sexually active and at increased risk.  Osteoporosis is a disease in which the bones lose minerals and strength with aging. This can result in serious bone fractures. The risk of osteoporosis can be identified using a  bone density scan. Women ages 65 and over and women at risk for fractures or osteoporosis should discuss screening with their caregivers. Ask your caregiver whether you should take a calcium supplement or vitamin D to reduce the rate of osteoporosis.  Menopause can be associated with physical symptoms and risks. Hormone replacement therapy is available to decrease symptoms and risks. You should talk to your caregiver about whether hormone replacement therapy is right for you.  Use sunscreen. Apply sunscreen liberally and repeatedly throughout the day. You should seek shade   when your shadow is shorter than you. Protect yourself by wearing long sleeves, pants, a wide-brimmed hat, and sunglasses year round, whenever you are outdoors.  Once a month, do a whole body skin exam, using a mirror to look at the skin on your back. Notify your caregiver of new moles, moles that have irregular borders, moles that are larger than a pencil eraser, or moles that have changed in shape or color.  Stay current with required immunizations.  Influenza vaccine. All adults should be immunized every year.  Tetanus, diphtheria, and acellular pertussis (Td, Tdap) vaccine. Pregnant women should receive 1 dose of Tdap vaccine during each pregnancy. The dose should be obtained regardless of the length of time since the last dose. Immunization is preferred during the 27th to 36th week of gestation. An adult who has not previously received Tdap or who does not know her vaccine status should receive 1 dose of Tdap. This initial dose should be followed by tetanus and diphtheria toxoids (Td) booster doses every 10 years. Adults with an unknown or incomplete history of completing a 3-dose immunization series with Td-containing vaccines should begin or complete a primary immunization series including a Tdap dose. Adults should receive a Td booster every 10 years.  Varicella vaccine. An adult without evidence of immunity to varicella  should receive 2 doses or a second dose if she has previously received 1 dose. Pregnant females who do not have evidence of immunity should receive the first dose after pregnancy. This first dose should be obtained before leaving the health care facility. The second dose should be obtained 4 8 weeks after the first dose.  Human papillomavirus (HPV) vaccine. Females aged 13 26 years who have not received the vaccine previously should obtain the 3-dose series. The vaccine is not recommended for use in pregnant females. However, pregnancy testing is not needed before receiving a dose. If a female is found to be pregnant after receiving a dose, no treatment is needed. In that case, the remaining doses should be delayed until after the pregnancy. Immunization is recommended for any person with an immunocompromised condition through the age of 26 years if she did not get any or all doses earlier. During the 3-dose series, the second dose should be obtained 4 8 weeks after the first dose. The third dose should be obtained 24 weeks after the first dose and 16 weeks after the second dose.  Zoster vaccine. One dose is recommended for adults aged 60 years or older unless certain conditions are present.  Measles, mumps, and rubella (MMR) vaccine. Adults born before 1957 generally are considered immune to measles and mumps. Adults born in 1957 or later should have 1 or more doses of MMR vaccine unless there is a contraindication to the vaccine or there is laboratory evidence of immunity to each of the three diseases. A routine second dose of MMR vaccine should be obtained at least 28 days after the first dose for students attending postsecondary schools, health care workers, or international travelers. People who received inactivated measles vaccine or an unknown type of measles vaccine during 1963 1967 should receive 2 doses of MMR vaccine. People who received inactivated mumps vaccine or an unknown type of mumps vaccine  before 1979 and are at high risk for mumps infection should consider immunization with 2 doses of MMR vaccine. For females of childbearing age, rubella immunity should be determined. If there is no evidence of immunity, females who are not pregnant should be vaccinated. If there   is no evidence of immunity, females who are pregnant should delay immunization until after pregnancy. Unvaccinated health care workers born before 1957 who lack laboratory evidence of measles, mumps, or rubella immunity or laboratory confirmation of disease should consider measles and mumps immunization with 2 doses of MMR vaccine or rubella immunization with 1 dose of MMR vaccine.  Pneumococcal 13-valent conjugate (PCV13) vaccine. When indicated, a person who is uncertain of her immunization history and has no record of immunization should receive the PCV13 vaccine. An adult aged 19 years or older who has certain medical conditions and has not been previously immunized should receive 1 dose of PCV13 vaccine. This PCV13 should be followed with a dose of pneumococcal polysaccharide (PPSV23) vaccine. The PPSV23 vaccine dose should be obtained at least 8 weeks after the dose of PCV13 vaccine. An adult aged 19 years or older who has certain medical conditions and previously received 1 or more doses of PPSV23 vaccine should receive 1 dose of PCV13. The PCV13 vaccine dose should be obtained 1 or more years after the last PPSV23 vaccine dose.  Pneumococcal polysaccharide (PPSV23) vaccine. When PCV13 is also indicated, PCV13 should be obtained first. All adults aged 65 years and older should be immunized. An adult younger than age 65 years who has certain medical conditions should be immunized. Any person who resides in a nursing home or long-term care facility should be immunized. An adult smoker should be immunized. People with an immunocompromised condition and certain other conditions should receive both PCV13 and PPSV23 vaccines. People  with human immunodeficiency virus (HIV) infection should be immunized as soon as possible after diagnosis. Immunization during chemotherapy or radiation therapy should be avoided. Routine use of PPSV23 vaccine is not recommended for American Indians, [place] Natives, or people younger than 65 years unless there are medical conditions that require PPSV23 vaccine. When indicated, people who have unknown immunization and have no record of immunization should receive PPSV23 vaccine. One-time revaccination 5 years after the first dose of PPSV23 is recommended for people aged 19 64 years who have chronic kidney failure, nephrotic syndrome, asplenia, or immunocompromised conditions. People who received 1 2 doses of PPSV23 before age 65 years should receive another dose of PPSV23 vaccine at age 65 years or later if at least 5 years have passed since the previous dose. Doses of PPSV23 are not needed for people immunized with PPSV23 at or after age 65 years.  Meningococcal vaccine. Adults with asplenia or persistent complement component deficiencies should receive 2 doses of quadrivalent meningococcal conjugate (MenACWY-D) vaccine. The doses should be obtained at least 2 months apart. Microbiologists working with certain meningococcal bacteria, military recruits, people at risk during an outbreak, and people who travel to or live in countries with a high rate of meningitis should be immunized. A first-year college student up through age 21 years who is living in a residence hall should receive a dose if she did not receive a dose on or after her 16th birthday. Adults who have certain high-risk conditions should receive one or more doses of vaccine.  Hepatitis A vaccine. Adults who wish to be protected from this disease, have certain high-risk conditions, work with hepatitis A-infected animals, work in hepatitis A research labs, or travel to or work in countries with a high rate of hepatitis A should be immunized. Adults  who were previously unvaccinated and who anticipate close contact with an international adoptee during the first 60 days after arrival in the [place] from a country   with a high rate of hepatitis A should be immunized.  Hepatitis B vaccine. Adults who wish to be protected from this disease, have certain high-risk conditions, may be exposed to blood or other infectious body fluids, are household contacts or sex partners of hepatitis B positive people, are clients or workers in certain care facilities, or travel to or work in countries with a high rate of hepatitis B should be immunized.  Haemophilus influenzae type b (Hib) vaccine. A previously unvaccinated person with asplenia or sickle cell disease or having a scheduled splenectomy should receive 1 dose of Hib vaccine. Regardless of previous immunization, a recipient of a hematopoietic stem cell transplant should receive a 3-dose series 6 12 months after her successful transplant. Hib vaccine is not recommended for adults with HIV infection. Preventive Services / Frequency Ages 19 to 39  Blood pressure check.** / Every 1 to 2 years.  Lipid and cholesterol check.** / Every 5 years beginning at age 20.  Clinical breast exam.** / Every 3 years for women in their 20s and 30s.  BRCA-related cancer risk assessment.** / For women who have family members with a BRCA-related cancer (breast, ovarian, tubal, or peritoneal cancers).  Pap test.** / Every 2 years from ages 21 through 29. Every 3 years starting at age 30 through age 65 or 70 with a history of 3 consecutive normal Pap tests.  HPV screening.** / Every 3 years from ages 30 through ages 65 to 70 with a history of 3 consecutive normal Pap tests.  Hepatitis C blood test.** / For any individual with known risks for hepatitis C.  Skin self-exam. / Monthly.  Influenza vaccine. / Every year.  Tetanus, diphtheria, and acellular pertussis (Tdap, Td) vaccine.** / Consult your caregiver. Pregnant  women should receive 1 dose of Tdap vaccine during each pregnancy. 1 dose of Td every 10 years.  Varicella vaccine.** / Consult your caregiver. Pregnant females who do not have evidence of immunity should receive the first dose after pregnancy.  HPV vaccine. / 3 doses over 6 months, if 26 and younger. The vaccine is not recommended for use in pregnant females. However, pregnancy testing is not needed before receiving a dose.  Measles, mumps, rubella (MMR) vaccine.** / You need at least 1 dose of MMR if you were born in 1957 or later. You may also need a 2nd dose. For females of childbearing age, rubella immunity should be determined. If there is no evidence of immunity, females who are not pregnant should be vaccinated. If there is no evidence of immunity, females who are pregnant should delay immunization until after pregnancy.  Pneumococcal 13-valent conjugate (PCV13) vaccine.** / Consult your caregiver.  Pneumococcal polysaccharide (PPSV23) vaccine.** / 1 to 2 doses if you smoke cigarettes or if you have certain conditions.  Meningococcal vaccine.** / 1 dose if you are age 19 to 21 years and a first-year college student living in a residence hall, or have one of several medical conditions, you need to get vaccinated against meningococcal disease. You may also need additional booster doses.  Hepatitis A vaccine.** / Consult your caregiver.  Hepatitis B vaccine.** / Consult your caregiver.  Haemophilus influenzae type b (Hib) vaccine.** / Consult your caregiver. Ages 40 to 64  Blood pressure check.** / Every 1 to 2 years.  Lipid and cholesterol check.** / Every 5 years beginning at age 20.  Lung cancer screening. / Every year if you are aged 55 80 years and have a 30-pack-year history of smoking and   currently smoke or have quit within the past 15 years. Yearly screening is stopped once you have quit smoking for at least 15 years or develop a health problem that would prevent you from having  lung cancer treatment.  Clinical breast exam.** / Every year after age 40.  BRCA-related cancer risk assessment.** / For women who have family members with a BRCA-related cancer (breast, ovarian, tubal, or peritoneal cancers).  Mammogram.** / Every year beginning at age 40 and continuing for as long as you are in good health. Consult with your caregiver.  Pap test.** / Every 3 years starting at age 30 through age 65 or 70 with a history of 3 consecutive normal Pap tests.  HPV screening.** / Every 3 years from ages 30 through ages 65 to 70 with a history of 3 consecutive normal Pap tests.  Fecal occult blood test (FOBT) of stool. / Every year beginning at age 50 and continuing until age 75. You may not need to do this test if you get a colonoscopy every 10 years.  Flexible sigmoidoscopy or colonoscopy.** / Every 5 years for a flexible sigmoidoscopy or every 10 years for a colonoscopy beginning at age 50 and continuing until age 75.  Hepatitis C blood test.** / For all people born from 1945 through 1965 and any individual with known risks for hepatitis C.  Skin self-exam. / Monthly.  Influenza vaccine. / Every year.  Tetanus, diphtheria, and acellular pertussis (Tdap/Td) vaccine.** / Consult your caregiver. Pregnant women should receive 1 dose of Tdap vaccine during each pregnancy. 1 dose of Td every 10 years.  Varicella vaccine.** / Consult your caregiver. Pregnant females who do not have evidence of immunity should receive the first dose after pregnancy.  Zoster vaccine.** / 1 dose for adults aged 60 years or older.  Measles, mumps, rubella (MMR) vaccine.** / You need at least 1 dose of MMR if you were born in 1957 or later. You may also need a 2nd dose. For females of childbearing age, rubella immunity should be determined. If there is no evidence of immunity, females who are not pregnant should be vaccinated. If there is no evidence of immunity, females who are pregnant should delay  immunization until after pregnancy.  Pneumococcal 13-valent conjugate (PCV13) vaccine.** / Consult your caregiver.  Pneumococcal polysaccharide (PPSV23) vaccine.** / 1 to 2 doses if you smoke cigarettes or if you have certain conditions.  Meningococcal vaccine.** / Consult your caregiver.  Hepatitis A vaccine.** / Consult your caregiver.  Hepatitis B vaccine.** / Consult your caregiver.  Haemophilus influenzae type b (Hib) vaccine.** / Consult your caregiver. Ages 65 and over  Blood pressure check.** / Every 1 to 2 years.  Lipid and cholesterol check.** / Every 5 years beginning at age 20.  Lung cancer screening. / Every year if you are aged 55 80 years and have a 30-pack-year history of smoking and currently smoke or have quit within the past 15 years. Yearly screening is stopped once you have quit smoking for at least 15 years or develop a health problem that would prevent you from having lung cancer treatment.  Clinical breast exam.** / Every year after age 40.  BRCA-related cancer risk assessment.** / For women who have family members with a BRCA-related cancer (breast, ovarian, tubal, or peritoneal cancers).  Mammogram.** / Every year beginning at age 40 and continuing for as long as you are in good health. Consult with your caregiver.  Pap test.** / Every 3 years starting at age   30 through age 65 or 70 with a 3 consecutive normal Pap tests. Testing can be stopped between 65 and 70 with 3 consecutive normal Pap tests and no abnormal Pap or HPV tests in the past 10 years.  HPV screening.** / Every 3 years from ages 30 through ages 65 or 70 with a history of 3 consecutive normal Pap tests. Testing can be stopped between 65 and 70 with 3 consecutive normal Pap tests and no abnormal Pap or HPV tests in the past 10 years.  Fecal occult blood test (FOBT) of stool. / Every year beginning at age 50 and continuing until age 75. You may not need to do this test if you get a colonoscopy  every 10 years.  Flexible sigmoidoscopy or colonoscopy.** / Every 5 years for a flexible sigmoidoscopy or every 10 years for a colonoscopy beginning at age 50 and continuing until age 75.  Hepatitis C blood test.** / For all people born from 1945 through 1965 and any individual with known risks for hepatitis C.  Osteoporosis screening.** / A one-time screening for women ages 65 and over and women at risk for fractures or osteoporosis.  Skin self-exam. / Monthly.  Influenza vaccine. / Every year.  Tetanus, diphtheria, and acellular pertussis (Tdap/Td) vaccine.** / 1 dose of Td every 10 years.  Varicella vaccine.** / Consult your caregiver.  Zoster vaccine.** / 1 dose for adults aged 60 years or older.  Pneumococcal 13-valent conjugate (PCV13) vaccine.** / Consult your caregiver.  Pneumococcal polysaccharide (PPSV23) vaccine.** / 1 dose for all adults aged 65 years and older.  Meningococcal vaccine.** / Consult your caregiver.  Hepatitis A vaccine.** / Consult your caregiver.  Hepatitis B vaccine.** / Consult your caregiver.  Haemophilus influenzae type b (Hib) vaccine.** / Consult your caregiver. ** Family history and personal history of risk and conditions may change your caregiver's recommendations. Document Released: 08/03/2001 Document Revised: 10/02/2012 Document Reviewed: 11/02/2010 [employer] Patient Information 2014 [employer].  

## 2013-05-03 NOTE — Progress Notes (Signed)
Pre visit review using our clinic review tool, if applicable. No additional management support is needed unless otherwise documented below in the visit note. 

## 2013-07-20 ENCOUNTER — Encounter: Payer: Self-pay | Admitting: Internal Medicine

## 2013-07-20 ENCOUNTER — Ambulatory Visit (INDEPENDENT_AMBULATORY_CARE_PROVIDER_SITE_OTHER): Payer: Medicare Other | Admitting: Internal Medicine

## 2013-07-20 VITALS — BP 110/70 | HR 74 | Temp 98.1°F | Resp 16 | Ht 65.0 in | Wt 111.5 lb

## 2013-07-20 DIAGNOSIS — J019 Acute sinusitis, unspecified: Secondary | ICD-10-CM

## 2013-07-20 MED ORDER — METHYLPREDNISOLONE 4 MG PO KIT
PACK | ORAL | Status: DC
Start: 2013-07-20 — End: 2014-04-01

## 2013-07-20 MED ORDER — AMOXICILLIN 875 MG PO TABS: 875.0000 mg | ORAL_TABLET | Freq: Two times a day (BID) | ORAL | Status: DC

## 2013-07-20 NOTE — Progress Notes (Signed)
Pre visit review using our clinic review tool, if applicable. No additional management support is needed unless otherwise documented below in the visit note. 

## 2013-07-20 NOTE — Patient Instructions (Signed)

## 2013-07-22 NOTE — Assessment & Plan Note (Signed)
She has a lot of congestion and pressure so I have asked her to try a medrol dose pak Will treat the infection with amoxil

## 2013-07-22 NOTE — Progress Notes (Signed)
   Subjective:    Patient ID: Evelyn EdenJoan S Marcil, female    DOB: [birth year], 80 y.o.   MRN: [id number]  Sinusitis This is a recurrent problem. The current episode started 1 to 4 weeks ago. The problem has been gradually worsening since onset. There has been no fever. The fever has been present for less than 1 day. Her pain is at a severity of 0/10. She is experiencing no pain. Associated symptoms include chills, sinus pressure and a sore throat. Pertinent negatives include no congestion, coughing, diaphoresis, ear pain, headaches, hoarse voice, neck pain, shortness of breath, sneezing or swollen glands. Past treatments include oral decongestants. The treatment provided mild relief.      Review of Systems  Constitutional: Positive for chills. Negative for fever, diaphoresis, activity change, appetite change, fatigue and unexpected weight change.  HENT: Positive for postnasal drip, rhinorrhea, sinus pressure and sore throat. Negative for congestion, ear pain, hoarse voice, nosebleeds, sneezing, tinnitus, trouble swallowing and voice change.   Eyes: Negative.   Respiratory: Negative.  Negative for cough, chest tightness and shortness of breath.   Cardiovascular: Negative.  Negative for chest pain, palpitations and leg swelling.  Gastrointestinal: Negative.  Negative for nausea, vomiting, abdominal pain, diarrhea, constipation and blood in stool.  Endocrine: Negative.   Genitourinary: Negative.   Musculoskeletal: Negative.  Negative for neck pain.  Skin: Negative.   Allergic/Immunologic: Negative.   Neurological: Negative.  Negative for headaches.  Hematological: Negative.   Psychiatric/Behavioral: Negative.        Objective:   Physical Exam  Vitals reviewed. Constitutional: She is oriented to person, place, and time. She appears well-developed and well-nourished. No distress.  HENT:  Head: Normocephalic and atraumatic.  Right Ear: Hearing, tympanic membrane, external ear and ear canal  normal.  Left Ear: Hearing, tympanic membrane, external ear and ear canal normal.  Nose: Mucosal edema and rhinorrhea present. No nasal deformity. Right sinus exhibits no maxillary sinus tenderness and no frontal sinus tenderness. Left sinus exhibits maxillary sinus tenderness. Left sinus exhibits no frontal sinus tenderness.  Mouth/Throat: Oropharynx is clear and moist and mucous membranes are normal. Mucous membranes are not pale, not dry and not cyanotic. No oral lesions. No trismus in the jaw. No uvula swelling. No oropharyngeal exudate, posterior oropharyngeal edema, posterior oropharyngeal erythema or tonsillar abscesses.  Eyes: Conjunctivae are normal. Right eye exhibits no discharge. Left eye exhibits no discharge. No scleral icterus.  Neck: Normal range of motion. Neck supple. No JVD present. No tracheal deviation present. No thyromegaly present.  Cardiovascular: Normal rate, regular rhythm, normal heart sounds and intact distal pulses.  Exam reveals no gallop and no friction rub.   No murmur heard. Pulmonary/Chest: Effort normal and breath sounds normal. No stridor. No respiratory distress. She has no wheezes. She has no rales. She exhibits no tenderness.  Abdominal: Soft. Bowel sounds are normal. She exhibits no distension and no mass. There is no tenderness. There is no rebound and no guarding.  Musculoskeletal: Normal range of motion. She exhibits no edema and no tenderness.  Lymphadenopathy:    She has no cervical adenopathy.  Neurological: She is oriented to person, place, and time.  Skin: Skin is warm and dry. No rash noted. She is not diaphoretic. No erythema. No pallor.  Psychiatric: She has a normal mood and affect. Her behavior is normal. Judgment and thought content normal.          Assessment & Plan:

## 2013-11-26 ENCOUNTER — Other Ambulatory Visit: Payer: Self-pay | Admitting: Internal Medicine

## 2014-03-26 DIAGNOSIS — Z23 Encounter for immunization: Secondary | ICD-10-CM | POA: Diagnosis not present

## 2014-04-01 ENCOUNTER — Inpatient Hospital Stay (HOSPITAL_COMMUNITY): Payer: Medicare Other

## 2014-04-01 ENCOUNTER — Encounter (HOSPITAL_COMMUNITY): Payer: Self-pay | Admitting: Emergency Medicine

## 2014-04-01 ENCOUNTER — Observation Stay (HOSPITAL_COMMUNITY)
Admission: EM | Admit: 2014-04-01 | Discharge: 2014-04-02 | Disposition: A | Discharge status: Home / Self Care | Payer: Medicare Other | Attending: Internal Medicine | Admitting: Internal Medicine

## 2014-04-01 ENCOUNTER — Emergency Department (HOSPITAL_COMMUNITY): Payer: Medicare Other

## 2014-04-01 DIAGNOSIS — R55 Syncope and collapse: Secondary | ICD-10-CM | POA: Diagnosis not present

## 2014-04-01 DIAGNOSIS — E782 Mixed hyperlipidemia: Secondary | ICD-10-CM

## 2014-04-01 DIAGNOSIS — R404 Transient alteration of awareness: Secondary | ICD-10-CM | POA: Diagnosis not present

## 2014-04-01 DIAGNOSIS — B199 Unspecified viral hepatitis without hepatic coma: Secondary | ICD-10-CM | POA: Diagnosis not present

## 2014-04-01 DIAGNOSIS — Z79899 Other long term (current) drug therapy: Secondary | ICD-10-CM | POA: Insufficient documentation

## 2014-04-01 DIAGNOSIS — H5441 Blindness, right eye, normal vision left eye: Secondary | ICD-10-CM | POA: Insufficient documentation

## 2014-04-01 DIAGNOSIS — R319 Hematuria, unspecified: Secondary | ICD-10-CM | POA: Diagnosis not present

## 2014-04-01 DIAGNOSIS — N3020 Other chronic cystitis without hematuria: Secondary | ICD-10-CM | POA: Diagnosis not present

## 2014-04-01 DIAGNOSIS — K5900 Constipation, unspecified: Secondary | ICD-10-CM | POA: Diagnosis not present

## 2014-04-01 DIAGNOSIS — K759 Inflammatory liver disease, unspecified: Secondary | ICD-10-CM | POA: Diagnosis not present

## 2014-04-01 DIAGNOSIS — R531 Weakness: Secondary | ICD-10-CM | POA: Diagnosis not present

## 2014-04-01 DIAGNOSIS — R400 Somnolence: Secondary | ICD-10-CM | POA: Diagnosis present

## 2014-04-01 HISTORY — DX: Urinary tract infection, site not specified: N39.0

## 2014-04-01 HISTORY — DX: Syncope and collapse: R55

## 2014-04-01 LAB — COMPREHENSIVE METABOLIC PANEL
ALT: 16 U/L (ref 0–35)
ANION GAP: 16 — AB (ref 5–15)
AST: 30 U/L (ref 0–37)
Albumin: 3.4 g/dL — ABNORMAL LOW (ref 3.5–5.2)
Alkaline Phosphatase: 128 U/L — ABNORMAL HIGH (ref 39–117)
BUN: 23 mg/dL (ref 6–23)
CO2: 19 mEq/L (ref 19–32)
Calcium: 9.4 mg/dL (ref 8.4–10.5)
Chloride: 104 mEq/L (ref 96–112)
Creatinine, Ser: 1.09 mg/dL (ref 0.50–1.10)
GFR calc non Af Amer: 44 mL/min — ABNORMAL LOW (ref >90)
GFR, EST AFRICAN AMERICAN: 51 mL/min — AB (ref >90)
GLUCOSE: 135 mg/dL — AB (ref 70–99)
Potassium: 4.4 mEq/L (ref 3.7–5.3)
SODIUM: 139 meq/L (ref 137–147)
TOTAL PROTEIN: 7.3 g/dL (ref 6.0–8.3)
Total Bilirubin: 0.2 mg/dL — ABNORMAL LOW (ref 0.3–1.2)

## 2014-04-01 LAB — CBC WITH DIFFERENTIAL/PLATELET
Basophils Absolute: 0 10*3/uL (ref 0.0–0.1)
Basophils Relative: 1 % (ref 0–1)
EOS ABS: 0.1 10*3/uL (ref 0.0–0.7)
Eosinophils Relative: 2 % (ref 0–5)
HCT: 37.4 % (ref 36.0–46.0)
Hemoglobin: 12.2 g/dL (ref 12.0–15.0)
LYMPHS ABS: 1.4 10*3/uL (ref 0.7–4.0)
Lymphocytes Relative: 23 % (ref 12–46)
MCH: 30 pg (ref 26.0–34.0)
MCHC: 32.6 g/dL (ref 30.0–36.0)
MCV: 92.1 fL (ref 78.0–100.0)
MONOS PCT: 9 % (ref 3–12)
Monocytes Absolute: 0.6 10*3/uL (ref 0.1–1.0)
Neutro Abs: 4 10*3/uL (ref 1.7–7.7)
Neutrophils Relative %: 65 % (ref 43–77)
PLATELETS: 234 10*3/uL (ref 150–400)
RBC: 4.06 MIL/uL (ref 3.87–5.11)
RDW: 13.5 % (ref 11.5–15.5)
WBC: 6.2 10*3/uL (ref 4.0–10.5)

## 2014-04-01 LAB — CBG MONITORING, ED: Glucose-Capillary: 124 mg/dL — ABNORMAL HIGH (ref 70–99)

## 2014-04-01 LAB — I-STAT TROPONIN, ED: TROPONIN I, POC: 0 ng/mL (ref 0.00–0.08)

## 2014-04-01 MED ORDER — ENOXAPARIN SODIUM 30 MG/0.3ML ~~LOC~~ SOLN
30.0000 mg | SUBCUTANEOUS | Status: DC
  Filled 2014-04-01 (×2): qty 0.3

## 2014-04-01 MED ORDER — ASPIRIN EC 81 MG PO TBEC
162.0000 mg | DELAYED_RELEASE_TABLET | Freq: Every day | ORAL | Status: DC
  Administered 2014-04-02 (×2): 162 mg via ORAL
  Filled 2014-04-01 (×2): qty 2

## 2014-04-01 MED ORDER — OMEGA-3-ACID ETHYL ESTERS 1 G PO CAPS
1.0000 g | ORAL_CAPSULE | Freq: Every day | ORAL | Status: DC
  Administered 2014-04-02: 1 g via ORAL
  Filled 2014-04-01: qty 1

## 2014-04-01 MED ORDER — SODIUM CHLORIDE 0.9 % IJ SOLN: 3.0000 mL | Freq: Two times a day (BID) | INTRAMUSCULAR | Status: DC

## 2014-04-01 MED ORDER — SODIUM CHLORIDE 0.9 % IJ SOLN
3.0000 mL | INTRAMUSCULAR | Status: DC | PRN
Start: 2014-04-01 — End: 2014-04-02

## 2014-04-01 MED ORDER — ACETAMINOPHEN 325 MG PO TABS: 650.0000 mg | ORAL_TABLET | Freq: Four times a day (QID) | ORAL | Status: DC | PRN

## 2014-04-01 MED ORDER — ZOLPIDEM TARTRATE 5 MG PO TABS: 5.0000 mg | ORAL_TABLET | Freq: Every evening | ORAL | Status: DC | PRN

## 2014-04-01 MED ORDER — ACETAMINOPHEN 650 MG RE SUPP: 650.0000 mg | Freq: Four times a day (QID) | RECTAL | Status: DC | PRN

## 2014-04-01 MED ORDER — FLUOXETINE HCL 20 MG PO CAPS
20.0000 mg | ORAL_CAPSULE | Freq: Every day | ORAL | Status: DC
  Administered 2014-04-02: 20 mg via ORAL
  Filled 2014-04-01: qty 1

## 2014-04-01 MED ORDER — SODIUM CHLORIDE 0.9 % IV SOLN: 250.0000 mL | INTRAVENOUS | Status: DC | PRN

## 2014-04-01 MED ORDER — ASPIRIN 325 MG PO TABS
162.0000 mg | ORAL_TABLET | Freq: Every day | ORAL | Status: DC
  Filled 2014-04-01: qty 0.5

## 2014-04-01 NOTE — ED Notes (Signed)
MD at bedside. Admitting  

## 2014-04-01 NOTE — ED Notes (Signed)
Patient had syncopal episode today, patient states she began weak and faint, patient states she sat down and then she passed out, patient does not remember events, FSBS 115 per ems , no fall, no injuries

## 2014-04-01 NOTE — ED Provider Notes (Addendum)
CSN: [id number]     Arrival date & time 04/01/14  1630 History   First MD Initiated Contact with Patient 04/01/14 1630     Chief Complaint  Patient presents with  . Loss of Consciousness     (Consider location/radiation/quality/duration/timing/severity/associated sxs/prior Treatment) Patient is a 80 y.o. female presenting with syncope.  Loss of Consciousness Episode history:  Single Most recent episode:  Today Duration: brief. Timing:  Constant Progression:  Resolved Chronicity:  New Context comment:  Standing Witnessed: yes   Relieved by:  Lying down Worsened by:  Nothing tried Ineffective treatments:  None tried Associated symptoms: no chest pain, no dizziness, no fever, no nausea and no vomiting     Past Medical History  Diagnosis Date  . Hyperlipidemia   . Hepatitis, unspecified     statin related, resolved quickly  . H/O: hematuria   . Congenital blindness     right eye  . Chronic cystitis    Past Surgical History  Procedure Laterality Date  . Abdominal hysterectomy      vaginal  . Eye surgery      100% visual loss  . Bladder suspension      A-P   Family History  Problem Relation Age of Onset  . Cancer Neg Hx     Colon cancer  . Stroke Neg Hx   . Heart disease Neg Hx   . Hyperlipidemia Neg Hx   . Hypertension Neg Hx   . Kidney disease Neg Hx    History  Substance Use Topics  . Smoking status: Never Smoker   . Smokeless tobacco: Never Used  . Alcohol Use: No   OB History   Grav Para Term Preterm Abortions TAB SAB Ect Mult Living                 Review of Systems  Constitutional: Negative for fever.  Cardiovascular: Positive for syncope. Negative for chest pain.  Gastrointestinal: Negative for nausea and vomiting.  Neurological: Negative for dizziness.  All other systems reviewed and are negative.     Allergies  Review of patient's allergies indicates no known allergies.  Home Medications   Prior to Admission medications    Medication Sig Start Date End Date Taking? Authorizing Provider  FLUoxetine (PROZAC) 20 MG capsule Take 1 capsule (20 mg total) by mouth daily. 05/03/13  Yes Etta Grandchildhomas L Jones, MD  Omega-3 Fatty Acids (FISH OIL) 1000 MG CAPS Take 1 each by mouth daily.    Yes Historical Provider, MD  zolpidem (AMBIEN) 10 MG tablet TAKE 1/2 TABLET BY MOUTH AT AT BEDTIME AS NEEDED FOR SLEEP 11/26/13  Yes Etta Grandchildhomas L Jones, MD   BP 159/73  Pulse 64  Temp(Src) 98 F (36.7 C) (Oral)  Resp 18  Ht 5\' 4"  (1.626 m)  Wt 110 lb 6.4 oz (50.077 kg)  BMI 18.94 kg/m2  SpO2 100% Physical Exam  Vitals reviewed. Constitutional: She is oriented to person, place, and time. She appears well-developed and well-nourished.  HENT:  Head: Normocephalic and atraumatic.  Right Ear: External ear normal.  Left Ear: External ear normal.  Eyes: Conjunctivae and EOM are normal. Pupils are equal, round, and reactive to light.  Neck: Normal range of motion. Neck supple.  Cardiovascular: Normal rate, regular rhythm, normal heart sounds and intact distal pulses.   Pulmonary/Chest: Effort normal and breath sounds normal.  Abdominal: Soft. Bowel sounds are normal. There is no tenderness.  Musculoskeletal: Normal range of motion.  Neurological: She is alert and oriented  to person, place, and time. She has normal strength. No cranial nerve deficit or sensory deficit.  Skin: Skin is warm and dry.    ED Course  Procedures (including critical care time) Labs Review Labs Reviewed  COMPREHENSIVE METABOLIC PANEL - Abnormal; Notable for the following:    Glucose, Bld 135 (*)    Albumin 3.4 (*)    Alkaline Phosphatase 128 (*)    Total Bilirubin 0.2 (*)    GFR calc non Af Amer 44 (*)    GFR calc Af Amer 51 (*)    Anion gap 16 (*)    All other components within normal limits  CBG MONITORING, ED - Abnormal; Notable for the following:    Glucose-Capillary 124 (*)    All other components within normal limits  CBC WITH DIFFERENTIAL  URINALYSIS,  ROUTINE W REFLEX MICROSCOPIC  POCT CBG (FASTING - GLUCOSE)-MANUAL ENTRY  I-STAT TROPOININ, ED    Imaging Review Dg Chest 2 View  04/01/2014   CLINICAL DATA:  Hepatitis, hematuria, loss of consciousness  EXAM: CHEST  2 VIEW  COMPARISON:  06/24/2008  FINDINGS: The heart size and mediastinal contours are within normal limits. Both lungs are clear. The visualized skeletal structures are unremarkable.  IMPRESSION: No active cardiopulmonary disease.   Electronically Signed   By: Elige KoHetal  Patel   On: 04/01/2014 18:26     EKG Interpretation   Date/Time:  Monday April 01 2014 16:35:40 EDT Ventricular Rate:  67 PR Interval:  141 QRS Duration: 90 QT Interval:  397 QTC Calculation: 419 R Axis:   65 Text Interpretation:  Sinus rhythm No significant change was found  Confirmed by Mirian MoGentry, Matthew [phone]([id number]) on 04/01/2014 5:19:48 PM      MDM   Final diagnoses:  Syncope, unspecified syncope type  Constipation, unspecified constipation type  Mixed hyperlipidemia    80 y.o. female with pertinent PMH of HLD presents with a brief presyncopal episode shortly prior to arrival. Patient was in her normal state of health, began to feel near syncope, sat in a chair, then lost consciousness. She was lowered to the ground by bystanders.  She denies hit to head. No seizure-like activity or asymmetry. No neuro complaints.  She's never passed out before.  She had no following symptoms including fatigue, lethargy, asymmetric findings initially have some confusion to event.  On arrival vital signs and physical exam as above, benign.  EKG without acute abnormality.  Labs and imaging as above. Consulted the hospitalist for admission.    1. Syncope, unspecified syncope type   2. Constipation, unspecified constipation type   3. Mixed hyperlipidemia         Mirian MoMatthew Gentry, MD 04/02/14 [id number]  Mirian MoMatthew Gentry, MD 04/02/14 [phone]

## 2014-04-01 NOTE — ED Notes (Signed)
MD at bedside. 

## 2014-04-01 NOTE — H&P (Signed)
[facility]                                                                                    Patient Demographics  Evelyn [name], is a 80 y.o. female  CSN: [id number]  MRN: [id number]  DOB - July 25, 1924  Admit Date - 04/01/2014  Outpatient Primary MD for the patient is Sanda Lingerhomas Jones, MD   With History of -  Past Medical History  Diagnosis Date  . Hyperlipidemia   . Hepatitis, unspecified     statin related, resolved quickly  . H/O: hematuria   . Congenital blindness     right eye  . Chronic cystitis       Past Surgical History  Procedure Laterality Date  . Abdominal hysterectomy      vaginal  . Eye surgery      100% visual loss  . Bladder suspension      A-P    in for   Chief Complaint  Patient presents with  . Loss of Consciousness     HPI  Evelyn [name]  is a 80 y.o. 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 The patient reports that she stood for 2 hours before she sat down and she loss consciousness for 1 minute without any tonic-clonic activity. No history of seizures no history of syncopal episodes, no history of chest pains, palpitations, nausea, vomiting or diarrhea. Patient denies any headaches or loss of consciousness lately. Patient was found not to be orthostatic in the emergency room.    Review of Systems    In addition to the HPI above,  No Fever-chills, No Headache, No changes with Vision or hearing, No problems swallowing food or Liquids, No Chest pain, Cough or Shortness of Breath, No Abdominal pain, No Nausea or Vommitting, Bowel movements are regular, No Blood in stool or Urine, No dysuria, No new skin rashes or bruises, No new joints pains-aches,  No new weakness, tingling, numbness in any extremity, No recent weight gain or loss, No polyuria, polydypsia or polyphagia, No  significant Mental Stressors.  A full 10 point Review of Systems was done, except as stated above, all other Review of Systems were negative.   Social History History  Substance Use Topics  . Smoking status: Never Smoker   . Smokeless tobacco: Never Used  . Alcohol Use: No     Family history Father died of a stroke in his 1280s  Prior to Admission medications   Medication Sig Start Date End Date Taking? Authorizing Provider  FLUoxetine (PROZAC) 20 MG capsule Take 1 capsule (20 mg total) by mouth daily. 05/03/13  Yes Etta Grandchildhomas L Jones, MD  Omega-3 Fatty Acids (FISH OIL) 1000 MG CAPS Take 1 each by mouth daily.    Yes Historical Provider, MD  zolpidem (AMBIEN) 10 MG tablet TAKE 1/2 TABLET BY MOUTH AT AT BEDTIME AS NEEDED FOR SLEEP 11/26/13  Yes Etta Grandchildhomas L Jones, MD    No Known Allergies  Physical Exam  Vitals  Blood pressure 132/49, pulse 65, temperature 97.9 F (36.6 C),  temperature source Oral, resp. rate 20, height 5\' 5"  (1.651 m), weight 49.896 kg (110 lb), SpO2 100.00%.   1. General white American female ,very pleasant ,in no acute distress  2. Normal affect and insight, Not Suicidal or Homicidal, Awake Alert, Oriented X 3.  3. No F.N deficits grossly, ALL C.Nerves Intact, Strength 5/5 all 4 extremities, .  4. Ears and Eyes appear Normal, Conjunctivae clear, PERRLA. Moist Oral Mucosa.  5. Supple Neck, No JVD, No cervical lymphadenopathy appriciated, No Carotid Bruits.  6. Symmetrical Chest wall movement, Good air movement bilaterally, CTAB.  7. RRR, No Gallops, Rubs or Murmurs, No Parasternal Heave.  8. Positive Bowel Sounds, Abdomen Soft, Non tender, No organomegaly appriciated,No rebound -guarding or rigidity.  9.  No Cyanosis, Normal Skin Turgor, No Skin Rash or Bruise.  10. Good muscle tone,  joints appear normal , no effusions, Normal ROM.  11. No Palpable Lymph Nodes in Neck or Axillae    Data Review  CBC  Recent Labs Lab 04/01/14 1642  WBC 6.2  HGB  12.2  HCT 37.4  PLT 234  MCV 92.1  MCH 30.0  MCHC 32.6  RDW 13.5  LYMPHSABS 1.4  MONOABS 0.6  EOSABS 0.1  BASOSABS 0.0   ------------------------------------------------------------------------------------------------------------------  Chemistries   Recent Labs Lab 04/01/14 1642  NA 139  K 4.4  CL 104  CO2 19  GLUCOSE 135*  BUN 23  CREATININE 1.09  CALCIUM 9.4  AST 30  ALT 16  ALKPHOS 128*  BILITOT 0.2*   ------------------------------------------------------------------------------------------------------------------ estimated creatinine clearance is 28.6 ml/min (by C-G formula based on Cr of 1.09). ------------------------------------------------------------------------------------------------------------------ No results found for this basename: TSH, T4TOTAL, FREET3, T3FREE, THYROIDAB,  in the last 72 hours   Coagulation profile No results found for this basename: INR, PROTIME,  in the last 168 hours ------------------------------------------------------------------------------------------------------------------- No results found for this basename: DDIMER,  in the last 72 hours -------------------------------------------------------------------------------------------------------------------  Cardiac Enzymes No results found for this basename: CK, CKMB, TROPONINI, MYOGLOBIN,  in the last 168 hours ------------------------------------------------------------------------------------------------------------------ No components found with this basename: POCBNP,    ---------------------------------------------------------------------------------------------------------------  Urinalysis    Component Value Date/Time   COLORURINE YELLOW 03/06/2010 0947   APPEARANCEUR CLEAR 03/06/2010 0947   LABSPEC 1.025 03/06/2010 0947   PHURINE 6.0 03/06/2010 0947   GLUCOSEU NEGATIVE 03/06/2010 0947   BILIRUBINUR SMALL 03/06/2010 0947   KETONESUR TRACE 03/06/2010 0947    UROBILINOGEN 1.0 03/06/2010 0947   NITRITE NEGATIVE 03/06/2010 0947   LEUKOCYTESUR MODERATE 03/06/2010 0947    ----------------------------------------------------------------------------------------------------------------  Imaging results:   Dg Chest 2 View  04/01/2014   CLINICAL DATA:  Hepatitis, hematuria, loss of consciousness  EXAM: CHEST  2 VIEW  COMPARISON:  06/24/2008  FINDINGS: The heart size and mediastinal contours are within normal limits. Both lungs are clear. The visualized skeletal structures are unremarkable.  IMPRESSION: No active cardiopulmonary disease.   Electronically Signed   By: Elige Ko   On: 04/01/2014 18:26    My personal review of EKG: Rhythm NSR, rate of 67 with no acute changes    Assessment & Plan  1. Syncope     Observation     Serial troponins     Check echocardiogram     Check carotid Dopplers     Neurochecks     Aspirin 162 milligrams  2. History of hyperlipidemia with allergy to statins    DVT Prophylaxis Lovenox AM Labs Ordered, also please review Full Orders  Code Status full  Disposition Plan: Home  Time spent in minutes : 31 minutes  Condition GUARDED   @SIGNATURE @

## 2014-04-01 NOTE — ED Notes (Signed)
Patient transported to X-ray 

## 2014-04-02 ENCOUNTER — Encounter (HOSPITAL_COMMUNITY): Payer: Self-pay | Admitting: General Practice

## 2014-04-02 DIAGNOSIS — I359 Nonrheumatic aortic valve disorder, unspecified: Secondary | ICD-10-CM

## 2014-04-02 DIAGNOSIS — R55 Syncope and collapse: Secondary | ICD-10-CM | POA: Diagnosis not present

## 2014-04-02 DIAGNOSIS — K5900 Constipation, unspecified: Secondary | ICD-10-CM | POA: Diagnosis not present

## 2014-04-02 LAB — URINALYSIS, ROUTINE W REFLEX MICROSCOPIC
Bilirubin Urine: NEGATIVE
Glucose, UA: NEGATIVE mg/dL
Hgb urine dipstick: NEGATIVE
Ketones, ur: NEGATIVE mg/dL
NITRITE: NEGATIVE
Protein, ur: NEGATIVE mg/dL
Specific Gravity, Urine: 1.006 (ref 1.005–1.030)
UROBILINOGEN UA: 0.2 mg/dL (ref 0.0–1.0)
pH: 6.5 (ref 5.0–8.0)

## 2014-04-02 LAB — URINE MICROSCOPIC-ADD ON

## 2014-04-02 LAB — HEMOGLOBIN A1C
Hgb A1c MFr Bld: 5.8 % — ABNORMAL HIGH (ref <5.7)
Mean Plasma Glucose: 120 mg/dL — ABNORMAL HIGH (ref <117)

## 2014-04-02 LAB — TROPONIN I: Troponin I: <0.3 ng/mL (ref <0.30)

## 2014-04-02 NOTE — Progress Notes (Signed)
Pt A/0 x4 and vital signs stable. NSR. Pt ambulates well independently. No stroke deficits. Pt ready for discharge after echocardiogram.

## 2014-04-02 NOTE — Progress Notes (Signed)
UR completed 

## 2014-04-02 NOTE — Discharge Instructions (Signed)

## 2014-04-02 NOTE — Progress Notes (Signed)
  Echocardiogram 2D Echocardiogram has been performed.  Evelyn [name] 04/02/2014, 3:02 PM

## 2014-04-02 NOTE — Discharge Summary (Signed)
Physician Discharge Summary  Hans EdenJoan S Manes WUJ:[id number] DOB: [birth year] DOA: 04/01/2014  PCP: Sanda Lingerhomas Jones, MD  Admit date: 04/01/2014 Discharge date: 04/02/2014  Time spent: 45 minutes  Recommendations for Outpatient Follow-up:  1. Dr.Crenshaw, [facility] on 11/5 2.  Event Monitor requested, Cards office will call patient when ready to pick up  Discharge Diagnoses:  Active Problems:   Syncope   Depression   Chronic constipation  Discharge Condition: stable  Diet recommendation: regular  Filed Weights   04/01/14 1634 04/01/14 2009 04/02/14 0549  Weight: 49.896 kg (110 lb) 50.077 kg (110 lb 6.4 oz) 50 kg (110 lb 3.7 oz)    History of present illness:  HPI  Maureen ChattersJoan Leonhard is a 80 y.o. 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 The patient reported that she stood for 2 hours before she sat down and she loss consciousness for 1 minute without any tonic-clonic activity. No history of seizures no history of syncopal episodes, no history of chest pains, palpitations, nausea, vomiting or diarrhea.  Hospital Course:  Syncope -etiology unclear, no symptoms -no chest pain, dyspnea, dizziness etc -EKG normal, cardiac enzymes negative -Orthostatics negative, no events on Tele -2D ECHO with normal EF and wall motion -called cardiology and requested 30day event monitor, office will call her to set this up -i also made her a FU with Cardiology -Instructed not to drive or operate machinery till cleared by Cardiology  Procedures: ECHO Impressions: - Normal LV function; grade 1 diastolic dysfunction; mild AI; mildly elevated pulmonary pressure.    Discharge Exam: Filed Vitals:   04/02/14 0549  BP: 144/73  Pulse: 59  Temp: 98 F (36.7 C)  Resp: 18    General: AAOx3 Cardiovascular: S1S2/RRR Respiratory: CTAB  Discharge  Instructions You were cared for by a hospitalist during your hospital stay. If you have any questions about your discharge medications or the care you received while you were in the hospital after you are discharged, you can call the unit and asked to speak with the hospitalist on call if the hospitalist that took care of you is not available. Once you are discharged, your primary care physician will handle any further medical issues. Please note that NO REFILLS for any discharge medications will be authorized once you are discharged, as it is imperative that you return to your primary care physician (or establish a relationship with a primary care physician if you do not have one) for your aftercare needs so that they can reassess your need for medications and monitor your lab values.  Discharge Instructions   Diet general    Complete by:  As directed      Discharge instructions    Complete by:  As directed   DO not drive or operate machinery until cleared by Cardiologist upon FU     Increase activity slowly    Complete by:  As directed           Current Discharge Medication List    CONTINUE these medications which have NOT CHANGED   Details  FLUoxetine (PROZAC) 20 MG capsule Take 1 capsule (20 mg total) by mouth daily. Qty: 90 capsule, Refills: 3   Associated Diagnoses: Depressive disorder, not elsewhere classified    Omega-3 Fatty Acids (FISH OIL) 1000 MG CAPS Take 1 each by mouth daily.     zolpidem (AMBIEN) 10 MG tablet TAKE 1/2 TABLET  BY MOUTH AT AT BEDTIME AS NEEDED FOR SLEEP Qty: 30 tablet, Refills: 3       No Known Allergies    The results of significant diagnostics from this hospitalization (including imaging, microbiology, ancillary and laboratory) are listed below for reference.    Significant Diagnostic Studies: Dg Chest 2 View  04/01/2014   CLINICAL DATA:  Hepatitis, hematuria, loss of consciousness  EXAM: CHEST  2 VIEW  COMPARISON:  06/24/2008  FINDINGS: The heart  size and mediastinal contours are within normal limits. Both lungs are clear. The visualized skeletal structures are unremarkable.  IMPRESSION: No active cardiopulmonary disease.   Electronically Signed   By: Elige KoHetal  Patel   On: 04/01/2014 18:26   Ct Head Wo Contrast  04/02/2014   CLINICAL DATA:  Syncopal episode today.  EXAM: CT HEAD WITHOUT CONTRAST  TECHNIQUE: Contiguous axial images were obtained from the base of the skull through the vertex without intravenous contrast.  COMPARISON:  CT of the head February 06, 2006  FINDINGS: Effaced RIGHT frontal horn of the lateral ventricle, the septum pellucidum is tented towards the RIGHT. Minimal subependymal calcifications are unchanged. No hydrocephalus. No intraparenchymal hemorrhage, mass effect nor midline shift. Patchy supratentorial white matter hypodensities are within normal range for patient's age and though non-specific suggest sequelae of chronic small vessel ischemic disease. No acute large vascular territory infarcts.  No abnormal extra-axial fluid collections. Basal cisterns are patent. Moderate calcific atherosclerosis of the carotid siphons.  No skull fracture. The included ocular globes and orbital contents are non-suspicious. LEFT ocular lens implant. The mastoid aircells and included paranasal sinuses are well-aerated.  IMPRESSION: No acute intracranial process.  Stable appearance of the head: Effaced RIGHT frontal horn of the lateral ventricle with calcifications and suspected adhesions suggests remote infectious or inflammatory process.   Electronically Signed   By: Awilda Metroourtnay  Bloomer   On: 04/02/2014 01:05    Microbiology: No results found for this or any previous visit (from the past 240 hour(s)).   Labs: Basic Metabolic Panel:  Recent Labs Lab 04/01/14 1642  NA 139  K 4.4  CL 104  CO2 19  GLUCOSE 135*  BUN 23  CREATININE 1.09  CALCIUM 9.4   Liver Function Tests:  Recent Labs Lab 04/01/14 1642  AST 30  ALT 16  ALKPHOS  128*  BILITOT 0.2*  PROT 7.3  ALBUMIN 3.4*   No results found for this basename: LIPASE, AMYLASE,  in the last 168 hours No results found for this basename: AMMONIA,  in the last 168 hours CBC:  Recent Labs Lab 04/01/14 1642  WBC 6.2  NEUTROABS 4.0  HGB 12.2  HCT 37.4  MCV 92.1  PLT 234   Cardiac Enzymes:  Recent Labs Lab 04/02/14 0730  TROPONINI <0.30   BNP: BNP (last 3 results) No results found for this basename: PROBNP,  in the last 8760 hours CBG:  Recent Labs Lab 04/01/14 1659  GLUCAP 124*       Signed:  Chaniqua Brisby  [facility] 04/02/2014, 10:17 AM

## 2014-04-02 NOTE — Evaluation (Signed)
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  Has PMHx:  HLD, hematuria, hepatitis related to statins  Clinical Impression  Pt was seen for check of her ability to navigate safely at home after syncopal episode brought her to hospital.  Her state of health has been good, very independent physical function prior to this.  Has plan to dc home today and asked her to call MD if her strength feels altered and she feels unsafe walking.  No further needs identified.    Follow Up Recommendations No PT follow up    Equipment Recommendations  None recommended by PT    Recommendations for Other Services       Precautions / Restrictions Precautions Precautions: None Restrictions Weight Bearing Restrictions: No      Mobility  Bed Mobility Overal bed mobility: Modified Independent                Transfers Overall transfer level: Modified independent Equipment used: None                Ambulation/Gait Ambulation/Gait assistance: Modified independent (Device/Increase time) Ambulation Distance (Feet): 350 Feet Assistive device: None Gait Pattern/deviations: Step-through pattern;Narrow base of support (minimal drift from her path) Gait velocity: normal Gait velocity interpretation: at or above normal speed for age/gender    Stairs Stairs: Yes Stairs assistance: Modified independent (Device/Increase time) Stair Management: One rail Right;Alternating pattern Number of Stairs: 13 General stair comments: reminded pt to use handrail and she was able to demonstrate safe pattern  Wheelchair Mobility    Modified Rankin (Stroke Patients Only)       Balance Overall balance assessment: Independent                                           Pertinent Vitals/Pain Pain Assessment:  No/denies pain 150/71, pulse 69 and O2 sat 100% per nursing notes.    Home Living Family/patient expects to be discharged to:: Private residence Living Arrangements: Alone Available Help at Discharge: Family Type of Home: House Home Access: Stairs to enter Entrance Stairs-Rails: Right Entrance Stairs-Number of Steps: 2 Home Layout: Two level Home Equipment: None      Prior Function Level of Independence: Independent               Hand Dominance   Dominant Hand: Right    Extremity/Trunk Assessment   Upper Extremity Assessment: Overall WFL for tasks assessed           Lower Extremity Assessment: Overall WFL for tasks assessed      Cervical / Trunk Assessment: Normal  Communication   Communication: No difficulties  Cognition Arousal/Alertness: Awake/alert Behavior During Therapy: WFL for tasks assessed/performed Overall Cognitive Status: Within Functional Limits for tasks assessed                      General Comments General comments (skin integrity, edema, etc.): Pt had no losses of balance and age-related normal presentation of physical ability    Exercises        Assessment/Plan    PT Assessment Patent does not need any further PT services  PT Diagnosis Other (comment) (assessment of physical function for alterations)   PT Problem List  PT Treatment Interventions     PT Goals (Current goals can be found in the Care Plan section) Acute Rehab PT Goals Patient Stated Goal: to go home with daughter to get her PT Goal Formulation: No goals set, d/c therapy    Frequency     Barriers to discharge        Co-evaluation               End of Session Equipment Utilized During Treatment:  (none) Activity Tolerance: Patient tolerated treatment well Patient left: in bed;with call bell/phone within reach Nurse Communication: Mobility status         Time: [id number] PT Time Calculation (min): 24 min   Charges:   PT  Evaluation $Initial PT Evaluation Tier I: 1 Procedure PT Treatments $Gait Training: 8-22 mins   PT G CodesIvar Drape:          Izaias Krupka E 04/02/2014, 4:34 PM Samul Dadauth Annah Jasko, PT MS Acute Rehab Dept. Number: [phone]

## 2014-04-03 ENCOUNTER — Telehealth: Payer: Self-pay | Admitting: *Deleted

## 2014-04-03 DIAGNOSIS — Z961 Presence of intraocular lens: Secondary | ICD-10-CM | POA: Diagnosis not present

## 2014-04-03 DIAGNOSIS — H52202 Unspecified astigmatism, left eye: Secondary | ICD-10-CM | POA: Diagnosis not present

## 2014-04-03 DIAGNOSIS — H31001 Unspecified chorioretinal scars, right eye: Secondary | ICD-10-CM | POA: Diagnosis not present

## 2014-04-03 NOTE — Progress Notes (Signed)
04/02/14 1517  PT G-Codes **NOT FOR INPATIENT CLASS**  Functional Assessment Tool Used clinical judgment  Functional Limitation Mobility: Walking and moving around  Mobility: Walking and Moving Around Current Status ([phone](G8978) CI  Mobility: Walking and Moving Around Goal Status [phone](G8979) CI  Mobility: Walking and Moving Around Discharge Status [phone](G8980) CI   Late entry for 04/02/14  Samul Dadauth Ranesha Val, PT MS Acute Rehab Dept. Number: [phone]

## 2014-04-03 NOTE — Telephone Encounter (Signed)
Transition Care Management Follow-up Telephone Call D/C 04/02/14  How have you been since you were released from the hospital? Pt states she is feeling fine   Do you understand why you were in the hospital? YES she stated she understood why they admitted her, but she just not sure why she past out  Do you understand the discharge instrcutions? YES we went over discharge summary  Items Reviewed:  Medications reviewed: YES, reviewed  Allergies reviewed: YES, reviewed  Dietary changes reviewed: NO  Referrals reviewed: No referrals recommended just to f/u with [facility] 04/25/14   Functional Questionnaire:   Activities of Daily Living (ADLs):   She states she is independent can feed and dress herself, walking, ect  She states she doesn't require assistance   Any transportation issues/concerns?: NO   Any patient concerns? NO   Confirmed importance and date/time of follow-up visits scheduled: YES, made appt for 04/11/14 with Dr. Yetta BarreJones   Confirmed with patient if condition begins to worsen call PCP or go to the ER.  Patient was given the Call-a-Nurse line [phone]: YES

## 2014-04-11 ENCOUNTER — Ambulatory Visit (INDEPENDENT_AMBULATORY_CARE_PROVIDER_SITE_OTHER): Payer: Medicare Other | Admitting: Internal Medicine

## 2014-04-11 ENCOUNTER — Encounter: Payer: Self-pay | Admitting: Internal Medicine

## 2014-04-11 VITALS — BP 110/70 | HR 59 | Temp 97.8°F | Resp 16 | Ht 64.0 in | Wt 113.2 lb

## 2014-04-11 DIAGNOSIS — F329 Major depressive disorder, single episode, unspecified: Secondary | ICD-10-CM

## 2014-04-11 DIAGNOSIS — G4700 Insomnia, unspecified: Secondary | ICD-10-CM

## 2014-04-11 DIAGNOSIS — R739 Hyperglycemia, unspecified: Secondary | ICD-10-CM

## 2014-04-11 DIAGNOSIS — F450 Somatization disorder: Secondary | ICD-10-CM

## 2014-04-11 DIAGNOSIS — F32A Depression, unspecified: Secondary | ICD-10-CM

## 2014-04-11 DIAGNOSIS — K5900 Constipation, unspecified: Secondary | ICD-10-CM

## 2014-04-11 MED ORDER — ZOLPIDEM TARTRATE 10 MG PO TABS: 5.0000 mg | ORAL_TABLET | Freq: Every evening | ORAL | Status: DC | PRN

## 2014-04-11 MED ORDER — FLUOXETINE HCL 20 MG PO CAPS: 40.0000 mg | ORAL_CAPSULE | Freq: Every day | ORAL | Status: DC

## 2014-04-11 MED ORDER — LINACLOTIDE 145 MCG PO CAPS: 145.0000 ug | ORAL_CAPSULE | Freq: Every day | ORAL | Status: DC

## 2014-04-11 NOTE — Assessment & Plan Note (Signed)
The syncopal episode appears to have been from exhaustion Today she asks that I increase the dose of prozac to see if it will help her feel better

## 2014-04-11 NOTE — Patient Instructions (Signed)

## 2014-04-11 NOTE — Progress Notes (Signed)
Pre visit review using our clinic review tool, if applicable. No additional management support is needed unless otherwise documented below in the visit note. 

## 2014-04-11 NOTE — Assessment & Plan Note (Signed)
Will restart linzess

## 2014-04-11 NOTE — Assessment & Plan Note (Signed)
She has mild pre-diabetes

## 2014-04-11 NOTE — Progress Notes (Signed)
Subjective:    Patient ID: Evelyn [name], female    DOB: 07/11/1924, 80 y.o.   MRN: [id number]  HPI Comments: She returns for f/up after an admission a week ago for a syncopal episode that occurred after standing for a long period. She has fel well since then but still complains of constipation and intermittent abd bloating. She has been trying miralax without much relief. She previously took linzess and had a good response but stopped taking it because her insurance would not pay for it.     Review of Systems  Constitutional: Positive for fatigue. Negative for fever, chills, diaphoresis, activity change, appetite change and unexpected weight change.  HENT: Negative.   Eyes: Negative.   Respiratory: Negative.  Negative for cough, choking, chest tightness, shortness of breath, wheezing and stridor.   Cardiovascular: Negative.  Negative for chest pain, palpitations and leg swelling.  Gastrointestinal: Positive for constipation. Negative for nausea, vomiting, abdominal pain, diarrhea, blood in stool, abdominal distention, anal bleeding and rectal pain.  Endocrine: Negative.   Genitourinary: Negative.   Musculoskeletal: Negative.   Skin: Negative.  Negative for rash.  Allergic/Immunologic: Negative.   Neurological: Negative.  Negative for dizziness, tremors, weakness, light-headedness and numbness.  Hematological: Negative.  Negative for adenopathy. Does not bruise/bleed easily.  Psychiatric/Behavioral: Positive for sleep disturbance and dysphoric mood. Negative for suicidal ideas, hallucinations, behavioral problems, confusion, self-injury, decreased concentration and agitation. The patient is nervous/anxious. The patient is not hyperactive.        Objective:   Physical Exam  Vitals reviewed. Constitutional: She is oriented to person, place, and time. She appears well-developed and well-nourished.  Non-toxic appearance. She does not have a sickly appearance. She does not appear ill. No  distress.  HENT:  Head: Normocephalic and atraumatic.  Mouth/Throat: Oropharynx is clear and moist. No oropharyngeal exudate.  Eyes: Conjunctivae are normal. Right eye exhibits no discharge. Left eye exhibits no discharge. No scleral icterus.  Neck: Normal range of motion. Neck supple. No JVD present. No tracheal deviation present. No thyromegaly present.  Cardiovascular: Normal rate, regular rhythm, normal heart sounds and intact distal pulses.  Exam reveals no gallop and no friction rub.   No murmur heard. Pulmonary/Chest: Effort normal and breath sounds normal. No stridor. No respiratory distress. She has no wheezes. She has no rales. She exhibits no tenderness.  Abdominal: Soft. Normal appearance and bowel sounds are normal. She exhibits no shifting dullness, no distension, no pulsatile liver, no fluid wave, no abdominal bruit, no ascites, no pulsatile midline mass and no mass. There is no hepatosplenomegaly, splenomegaly or hepatomegaly. There is no tenderness. There is no rebound, no guarding and no CVA tenderness.  Musculoskeletal: Normal range of motion. She exhibits no edema and no tenderness.  Lymphadenopathy:    She has no cervical adenopathy.  Neurological: She is oriented to person, place, and time.  Skin: Skin is warm and dry. No rash noted. She is not diaphoretic. No erythema. No pallor.  Psychiatric: She has a normal mood and affect. Her behavior is normal. Judgment and thought content normal.    Lab Results  Component Value Date   WBC 6.2 04/01/2014   HGB 12.2 04/01/2014   HCT 37.4 04/01/2014   PLT 234 04/01/2014   GLUCOSE 135* 04/01/2014   CHOL 225* 05/03/2013   TRIG 74.0 05/03/2013   HDL 63.60 05/03/2013   LDLDIRECT 146.5 05/03/2013   ALT 16 04/01/2014   AST 30 04/01/2014   NA 139 04/01/2014   K  4.4 04/01/2014   CL 104 04/01/2014   CREATININE 1.09 04/01/2014   BUN 23 04/01/2014   CO2 19 04/01/2014   TSH 1.48 05/03/2013   INR 1.1* 08/13/2010   HGBA1C 5.8*  04/02/2014        Assessment & Plan:

## 2014-04-25 ENCOUNTER — Ambulatory Visit: Payer: Medicare Other | Admitting: Cardiology

## 2014-05-01 DIAGNOSIS — R739 Hyperglycemia, unspecified: Secondary | ICD-10-CM | POA: Diagnosis not present

## 2014-05-01 DIAGNOSIS — F450 Somatization disorder: Secondary | ICD-10-CM | POA: Diagnosis not present

## 2014-05-01 DIAGNOSIS — F329 Major depressive disorder, single episode, unspecified: Secondary | ICD-10-CM

## 2014-05-01 DIAGNOSIS — G4700 Insomnia, unspecified: Secondary | ICD-10-CM | POA: Diagnosis not present

## 2014-05-01 DIAGNOSIS — K5900 Constipation, unspecified: Secondary | ICD-10-CM

## 2014-10-30 ENCOUNTER — Encounter: Payer: Self-pay | Admitting: Gastroenterology

## 2014-12-05 ENCOUNTER — Encounter: Payer: Self-pay | Admitting: Internal Medicine

## 2014-12-05 ENCOUNTER — Other Ambulatory Visit (INDEPENDENT_AMBULATORY_CARE_PROVIDER_SITE_OTHER): Payer: Medicare Other

## 2014-12-05 ENCOUNTER — Ambulatory Visit (INDEPENDENT_AMBULATORY_CARE_PROVIDER_SITE_OTHER): Payer: Medicare Other | Admitting: Internal Medicine

## 2014-12-05 VITALS — BP 120/88 | HR 79 | Temp 97.6°F | Resp 16 | Ht 64.0 in | Wt 115.0 lb

## 2014-12-05 DIAGNOSIS — G4700 Insomnia, unspecified: Secondary | ICD-10-CM | POA: Diagnosis not present

## 2014-12-05 DIAGNOSIS — Z0000 Encounter for general adult medical examination without abnormal findings: Secondary | ICD-10-CM

## 2014-12-05 DIAGNOSIS — R739 Hyperglycemia, unspecified: Secondary | ICD-10-CM | POA: Diagnosis not present

## 2014-12-05 DIAGNOSIS — K5909 Other constipation: Secondary | ICD-10-CM

## 2014-12-05 DIAGNOSIS — F450 Somatization disorder: Secondary | ICD-10-CM

## 2014-12-05 DIAGNOSIS — E782 Mixed hyperlipidemia: Secondary | ICD-10-CM

## 2014-12-05 DIAGNOSIS — F329 Major depressive disorder, single episode, unspecified: Secondary | ICD-10-CM

## 2014-12-05 LAB — COMPREHENSIVE METABOLIC PANEL
ALK PHOS: 96 U/L (ref 39–117)
ALT: 18 U/L (ref 0–35)
AST: 32 U/L (ref 0–37)
Albumin: 4 g/dL (ref 3.5–5.2)
BILIRUBIN TOTAL: 0.4 mg/dL (ref 0.2–1.2)
BUN: 22 mg/dL (ref 6–23)
CO2: 25 mEq/L (ref 19–32)
CREATININE: 0.93 mg/dL (ref 0.40–1.20)
Calcium: 9.3 mg/dL (ref 8.4–10.5)
Chloride: 102 mEq/L (ref 96–112)
GFR: 60.46 mL/min (ref >60.00)
Glucose, Bld: 88 mg/dL (ref 70–99)
Potassium: 4.5 mEq/L (ref 3.5–5.1)
Sodium: 134 mEq/L — ABNORMAL LOW (ref 135–145)
TOTAL PROTEIN: 7.2 g/dL (ref 6.0–8.3)

## 2014-12-05 LAB — CBC WITH DIFFERENTIAL/PLATELET
Basophils Absolute: 0 10*3/uL (ref 0.0–0.1)
Basophils Relative: 0.8 % (ref 0.0–3.0)
EOS PCT: 3.4 % (ref 0.0–5.0)
Eosinophils Absolute: 0.2 10*3/uL (ref 0.0–0.7)
HCT: 40.9 % (ref 36.0–46.0)
Hemoglobin: 13.6 g/dL (ref 12.0–15.0)
Lymphocytes Relative: 21.6 % (ref 12.0–46.0)
Lymphs Abs: 1 10*3/uL (ref 0.7–4.0)
MCHC: 33.2 g/dL (ref 30.0–36.0)
MCV: 90.6 fl (ref 78.0–100.0)
Monocytes Absolute: 0.5 10*3/uL (ref 0.1–1.0)
Monocytes Relative: 11.5 % (ref 3.0–12.0)
NEUTROS ABS: 3 10*3/uL (ref 1.4–7.7)
NEUTROS PCT: 62.7 % (ref 43.0–77.0)
Platelets: 221 10*3/uL (ref 150.0–400.0)
RBC: 4.51 Mil/uL (ref 3.87–5.11)
RDW: 14 % (ref 11.5–15.5)
WBC: 4.7 10*3/uL (ref 4.0–10.5)

## 2014-12-05 LAB — LIPID PANEL
Cholesterol: 206 mg/dL — ABNORMAL HIGH (ref 0–200)
HDL: 63.8 mg/dL (ref >39.00)
LDL CALC: 128 mg/dL — AB (ref 0–99)
NonHDL: 142.2
Total CHOL/HDL Ratio: 3
Triglycerides: 73 mg/dL (ref 0.0–149.0)
VLDL: 14.6 mg/dL (ref 0.0–40.0)

## 2014-12-05 LAB — HEMOGLOBIN A1C: Hgb A1c MFr Bld: 5.7 % (ref 4.6–6.5)

## 2014-12-05 LAB — TSH: TSH: 1.35 u[IU]/mL (ref 0.35–4.50)

## 2014-12-05 MED ORDER — ZOLPIDEM TARTRATE 10 MG PO TABS: 5.0000 mg | ORAL_TABLET | Freq: Every evening | ORAL | Status: DC | PRN

## 2014-12-05 MED ORDER — FLUOXETINE HCL 20 MG PO CAPS: 20.0000 mg | ORAL_CAPSULE | Freq: Every day | ORAL | Status: DC

## 2014-12-05 NOTE — Progress Notes (Signed)
Pre visit review using our clinic review tool, if applicable. No additional management support is needed unless otherwise documented below in the visit note. 

## 2014-12-05 NOTE — Patient Instructions (Signed)
Preventive Care for Adults A healthy lifestyle and preventive care can promote health and wellness. Preventive health guidelines for women include the following key practices.  A routine yearly physical is a good way to check with your health care provider about your health and preventive screening. It is a chance to share any concerns and updates on your health and to receive a thorough exam.  Visit your dentist for a routine exam and preventive care every 6 months. Brush your teeth twice a day and floss once a day. Good oral hygiene prevents tooth decay and gum disease.  The frequency of eye exams is based on your age, health, family medical history, use of contact lenses, and other factors. Follow your health care provider's recommendations for frequency of eye exams.  Eat a healthy diet. Foods like vegetables, fruits, whole grains, low-fat dairy products, and lean protein foods contain the nutrients you need without too many calories. Decrease your intake of foods high in solid fats, added sugars, and salt. Eat the right amount of calories for you.Get information about a proper diet from your health care provider, if necessary.  Regular physical exercise is one of the most important things you can do for your health. Most adults should get at least 150 minutes of moderate-intensity exercise (any activity that increases your heart rate and causes you to sweat) each week. In addition, most adults need muscle-strengthening exercises on 2 or more days a week.  Maintain a healthy weight. The body mass index (BMI) is a screening tool to identify possible weight problems. It provides an estimate of body fat based on height and weight. Your health care provider can find your BMI and can help you achieve or maintain a healthy weight.For adults 20 years and older:  A BMI below 18.5 is considered underweight.  A BMI of 18.5 to 24.9 is normal.  A BMI of 25 to 29.9 is considered overweight.  A BMI of  30 and above is considered obese.  Maintain normal blood lipids and cholesterol levels by exercising and minimizing your intake of saturated fat. Eat a balanced diet with plenty of fruit and vegetables. Blood tests for lipids and cholesterol should begin at age 76 and be repeated every 5 years. If your lipid or cholesterol levels are high, you are over 50, or you are at high risk for heart disease, you may need your cholesterol levels checked more frequently.Ongoing high lipid and cholesterol levels should be treated with medicines if diet and exercise are not working.  If you smoke, find out from your health care provider how to quit. If you do not use tobacco, do not start.  Lung cancer screening is recommended for adults aged 22-80 years who are at high risk for developing lung cancer because of a history of smoking. A yearly low-dose CT scan of the lungs is recommended for people who have at least a 30-pack-year history of smoking and are a current smoker or have quit within the past 15 years. A pack year of smoking is smoking an average of 1 pack of cigarettes a day for 1 year (for example: 1 pack a day for 30 years or 2 packs a day for 15 years). Yearly screening should continue until the smoker has stopped smoking for at least 15 years. Yearly screening should be stopped for people who develop a health problem that would prevent them from having lung cancer treatment.  If you are pregnant, do not drink alcohol. If you are breastfeeding,  be very cautious about drinking alcohol. If you are not pregnant and choose to drink alcohol, do not have more than 1 drink per day. One drink is considered to be 12 ounces (355 mL) of beer, 5 ounces (148 mL) of wine, or 1.5 ounces (44 mL) of liquor.  Avoid use of street drugs. Do not share needles with anyone. Ask for help if you need support or instructions about stopping the use of drugs.  High blood pressure causes heart disease and increases the risk of  stroke. Your blood pressure should be checked at least every 1 to 2 years. Ongoing high blood pressure should be treated with medicines if weight loss and exercise do not work.  If you are 3-86 years old, ask your health care provider if you should take aspirin to prevent strokes.  Diabetes screening involves taking a blood sample to check your fasting blood sugar level. This should be done once every 3 years, after age 67, if you are within normal weight and without risk factors for diabetes. Testing should be considered at a younger age or be carried out more frequently if you are overweight and have at least 1 risk factor for diabetes.  Breast cancer screening is essential preventive care for women. You should practice "breast self-awareness." This means understanding the normal appearance and feel of your breasts and may include breast self-examination. Any changes detected, no matter how small, should be reported to a health care provider. Women in their 8s and 30s should have a clinical breast exam (CBE) by a health care provider as part of a regular health exam every 1 to 3 years. After age 70, women should have a CBE every year. Starting at age 25, women should consider having a mammogram (breast X-ray test) every year. Women who have a family history of breast cancer should talk to their health care provider about genetic screening. Women at a high risk of breast cancer should talk to their health care providers about having an MRI and a mammogram every year.  Breast cancer gene (BRCA)-related cancer risk assessment is recommended for women who have family members with BRCA-related cancers. BRCA-related cancers include breast, ovarian, tubal, and peritoneal cancers. Having family members with these cancers may be associated with an increased risk for harmful changes (mutations) in the breast cancer genes BRCA1 and BRCA2. Results of the assessment will determine the need for genetic counseling and  BRCA1 and BRCA2 testing.  Routine pelvic exams to screen for cancer are no longer recommended for nonpregnant women who are considered low risk for cancer of the pelvic organs (ovaries, uterus, and vagina) and who do not have symptoms. Ask your health care provider if a screening pelvic exam is right for you.  If you have had past treatment for cervical cancer or a condition that could lead to cancer, you need Pap tests and screening for cancer for at least 20 years after your treatment. If Pap tests have been discontinued, your risk factors (such as having a new sexual partner) need to be reassessed to determine if screening should be resumed. Some women have medical problems that increase the chance of getting cervical cancer. In these cases, your health care provider may recommend more frequent screening and Pap tests.  The HPV test is an additional test that may be used for cervical cancer screening. The HPV test looks for the virus that can cause the cell changes on the cervix. The cells collected during the Pap test can be  tested for HPV. The HPV test could be used to screen women aged 30 years and older, and should be used in women of any age who have unclear Pap test results. After the age of 30, women should have HPV testing at the same frequency as a Pap test.  Colorectal cancer can be detected and often prevented. Most routine colorectal cancer screening begins at the age of 50 years and continues through age 75 years. However, your health care provider may recommend screening at an earlier age if you have risk factors for colon cancer. On a yearly basis, your health care provider may provide home test kits to check for hidden blood in the stool. Use of a small camera at the end of a tube, to directly examine the colon (sigmoidoscopy or colonoscopy), can detect the earliest forms of colorectal cancer. Talk to your health care provider about this at age 50, when routine screening begins. Direct  exam of the colon should be repeated every 5-10 years through age 75 years, unless early forms of pre-cancerous polyps or small growths are found.  People who are at an increased risk for hepatitis B should be screened for this virus. You are considered at high risk for hepatitis B if:  You were born in a country where hepatitis B occurs often. Talk with your health care provider about which countries are considered high risk.  Your parents were born in a high-risk country and you have not received a shot to protect against hepatitis B (hepatitis B vaccine).  You have HIV or AIDS.  You use needles to inject street drugs.  You live with, or have sex with, someone who has hepatitis B.  You get hemodialysis treatment.  You take certain medicines for conditions like cancer, organ transplantation, and autoimmune conditions.  Hepatitis C blood testing is recommended for all people born from 1945 through 1965 and any individual with known risks for hepatitis C.  Practice safe sex. Use condoms and avoid high-risk sexual practices to reduce the spread of sexually transmitted infections (STIs). STIs include gonorrhea, chlamydia, syphilis, trichomonas, herpes, HPV, and human immunodeficiency virus (HIV). Herpes, HIV, and HPV are viral illnesses that have no cure. They can result in disability, cancer, and death.  You should be screened for sexually transmitted illnesses (STIs) including gonorrhea and chlamydia if:  You are sexually active and are younger than 24 years.  You are older than 24 years and your health care provider tells you that you are at risk for this type of infection.  Your sexual activity has changed since you were last screened and you are at an increased risk for chlamydia or gonorrhea. Ask your health care provider if you are at risk.  If you are at risk of being infected with HIV, it is recommended that you take a prescription medicine daily to prevent HIV infection. This is  called preexposure prophylaxis (PrEP). You are considered at risk if:  You are a heterosexual woman, are sexually active, and are at increased risk for HIV infection.  You take drugs by injection.  You are sexually active with a partner who has HIV.  Talk with your health care provider about whether you are at high risk of being infected with HIV. If you choose to begin PrEP, you should first be tested for HIV. You should then be tested every 3 months for as long as you are taking PrEP.  Osteoporosis is a disease in which the bones lose minerals and strength   with aging. This can result in serious bone fractures or breaks. The risk of osteoporosis can be identified using a bone density scan. Women ages 65 years and over and women at risk for fractures or osteoporosis should discuss screening with their health care providers. Ask your health care provider whether you should take a calcium supplement or vitamin D to reduce the rate of osteoporosis.  Menopause can be associated with physical symptoms and risks. Hormone replacement therapy is available to decrease symptoms and risks. You should talk to your health care provider about whether hormone replacement therapy is right for you.  Use sunscreen. Apply sunscreen liberally and repeatedly throughout the day. You should seek shade when your shadow is shorter than you. Protect yourself by wearing long sleeves, pants, a wide-brimmed hat, and sunglasses year round, whenever you are outdoors.  Once a month, do a whole body skin exam, using a mirror to look at the skin on your back. Tell your health care provider of new moles, moles that have irregular borders, moles that are larger than a pencil eraser, or moles that have changed in shape or color.  Stay current with required vaccines (immunizations).  Influenza vaccine. All adults should be immunized every year.  Tetanus, diphtheria, and acellular pertussis (Td, Tdap) vaccine. Pregnant women should  receive 1 dose of Tdap vaccine during each pregnancy. The dose should be obtained regardless of the length of time since the last dose. Immunization is preferred during the 27th-36th week of gestation. An adult who has not previously received Tdap or who does not know her vaccine status should receive 1 dose of Tdap. This initial dose should be followed by tetanus and diphtheria toxoids (Td) booster doses every 10 years. Adults with an unknown or incomplete history of completing a 3-dose immunization series with Td-containing vaccines should begin or complete a primary immunization series including a Tdap dose. Adults should receive a Td booster every 10 years.  Varicella vaccine. An adult without evidence of immunity to varicella should receive 2 doses or a second dose if she has previously received 1 dose. Pregnant females who do not have evidence of immunity should receive the first dose after pregnancy. This first dose should be obtained before leaving the health care facility. The second dose should be obtained 4-8 weeks after the first dose.  Human papillomavirus (HPV) vaccine. Females aged 13-26 years who have not received the vaccine previously should obtain the 3-dose series. The vaccine is not recommended for use in pregnant females. However, pregnancy testing is not needed before receiving a dose. If a female is found to be pregnant after receiving a dose, no treatment is needed. In that case, the remaining doses should be delayed until after the pregnancy. Immunization is recommended for any person with an immunocompromised condition through the age of 26 years if she did not get any or all doses earlier. During the 3-dose series, the second dose should be obtained 4-8 weeks after the first dose. The third dose should be obtained 24 weeks after the first dose and 16 weeks after the second dose.  Zoster vaccine. One dose is recommended for adults aged 60 years or older unless certain conditions are  present.  Measles, mumps, and rubella (MMR) vaccine. Adults born before 1957 generally are considered immune to measles and mumps. Adults born in 1957 or later should have 1 or more doses of MMR vaccine unless there is a contraindication to the vaccine or there is laboratory evidence of immunity to   each of the three diseases. A routine second dose of MMR vaccine should be obtained at least 28 days after the first dose for students attending postsecondary schools, health care workers, or international travelers. People who received inactivated measles vaccine or an unknown type of measles vaccine during 1963-1967 should receive 2 doses of MMR vaccine. People who received inactivated mumps vaccine or an unknown type of mumps vaccine before 1979 and are at high risk for mumps infection should consider immunization with 2 doses of MMR vaccine. For females of childbearing age, rubella immunity should be determined. If there is no evidence of immunity, females who are not pregnant should be vaccinated. If there is no evidence of immunity, females who are pregnant should delay immunization until after pregnancy. Unvaccinated health care workers born before 1957 who lack laboratory evidence of measles, mumps, or rubella immunity or laboratory confirmation of disease should consider measles and mumps immunization with 2 doses of MMR vaccine or rubella immunization with 1 dose of MMR vaccine.  Pneumococcal 13-valent conjugate (PCV13) vaccine. When indicated, a person who is uncertain of her immunization history and has no record of immunization should receive the PCV13 vaccine. An adult aged 19 years or older who has certain medical conditions and has not been previously immunized should receive 1 dose of PCV13 vaccine. This PCV13 should be followed with a dose of pneumococcal polysaccharide (PPSV23) vaccine. The PPSV23 vaccine dose should be obtained at least 8 weeks after the dose of PCV13 vaccine. An adult aged 19  years or older who has certain medical conditions and previously received 1 or more doses of PPSV23 vaccine should receive 1 dose of PCV13. The PCV13 vaccine dose should be obtained 1 or more years after the last PPSV23 vaccine dose.  Pneumococcal polysaccharide (PPSV23) vaccine. When PCV13 is also indicated, PCV13 should be obtained first. All adults aged 65 years and older should be immunized. An adult younger than age 65 years who has certain medical conditions should be immunized. Any person who resides in a nursing home or long-term care facility should be immunized. An adult smoker should be immunized. People with an immunocompromised condition and certain other conditions should receive both PCV13 and PPSV23 vaccines. People with human immunodeficiency virus (HIV) infection should be immunized as soon as possible after diagnosis. Immunization during chemotherapy or radiation therapy should be avoided. Routine use of PPSV23 vaccine is not recommended for American Indians, [place] Natives, or people younger than 65 years unless there are medical conditions that require PPSV23 vaccine. When indicated, people who have unknown immunization and have no record of immunization should receive PPSV23 vaccine. One-time revaccination 5 years after the first dose of PPSV23 is recommended for people aged 19-64 years who have chronic kidney failure, nephrotic syndrome, asplenia, or immunocompromised conditions. People who received 1-2 doses of PPSV23 before age 65 years should receive another dose of PPSV23 vaccine at age 65 years or later if at least 5 years have passed since the previous dose. Doses of PPSV23 are not needed for people immunized with PPSV23 at or after age 65 years.  Meningococcal vaccine. Adults with asplenia or persistent complement component deficiencies should receive 2 doses of quadrivalent meningococcal conjugate (MenACWY-D) vaccine. The doses should be obtained at least 2 months apart.  Microbiologists working with certain meningococcal bacteria, military recruits, people at risk during an outbreak, and people who travel to or live in countries with a high rate of meningitis should be immunized. A first-year college student up through age   21 years who is living in a residence hall should receive a dose if she did not receive a dose on or after her 16th birthday. Adults who have certain high-risk conditions should receive one or more doses of vaccine.  Hepatitis A vaccine. Adults who wish to be protected from this disease, have certain high-risk conditions, work with hepatitis A-infected animals, work in hepatitis A research labs, or travel to or work in countries with a high rate of hepatitis A should be immunized. Adults who were previously unvaccinated and who anticipate close contact with an international adoptee during the first 60 days after arrival in the [place] from a country with a high rate of hepatitis A should be immunized.  Hepatitis B vaccine. Adults who wish to be protected from this disease, have certain high-risk conditions, may be exposed to blood or other infectious body fluids, are household contacts or sex partners of hepatitis B positive people, are clients or workers in certain care facilities, or travel to or work in countries with a high rate of hepatitis B should be immunized.  Haemophilus influenzae type b (Hib) vaccine. A previously unvaccinated person with asplenia or sickle cell disease or having a scheduled splenectomy should receive 1 dose of Hib vaccine. Regardless of previous immunization, a recipient of a hematopoietic stem cell transplant should receive a 3-dose series 6-12 months after her successful transplant. Hib vaccine is not recommended for adults with HIV infection. Preventive Services / Frequency Ages 64 to 68 years  Blood pressure check.** / Every 1 to 2 years.  Lipid and cholesterol check.** / Every 5 years beginning at age  22.  Clinical breast exam.** / Every 3 years for women in their 88s and 53s.  BRCA-related cancer risk assessment.** / For women who have family members with a BRCA-related cancer (breast, ovarian, tubal, or peritoneal cancers).  Pap test.** / Every 2 years from ages 90 through 51. Every 3 years starting at age 21 through age 56 or 3 with a history of 3 consecutive normal Pap tests.  HPV screening.** / Every 3 years from ages 24 through ages 1 to 46 with a history of 3 consecutive normal Pap tests.  Hepatitis C blood test.** / For any individual with known risks for hepatitis C.  Skin self-exam. / Monthly.  Influenza vaccine. / Every year.  Tetanus, diphtheria, and acellular pertussis (Tdap, Td) vaccine.** / Consult your health care provider. Pregnant women should receive 1 dose of Tdap vaccine during each pregnancy. 1 dose of Td every 10 years.  Varicella vaccine.** / Consult your health care provider. Pregnant females who do not have evidence of immunity should receive the first dose after pregnancy.  HPV vaccine. / 3 doses over 6 months, if 72 and younger. The vaccine is not recommended for use in pregnant females. However, pregnancy testing is not needed before receiving a dose.  Measles, mumps, rubella (MMR) vaccine.** / You need at least 1 dose of MMR if you were born in 1957 or later. You may also need a 2nd dose. For females of childbearing age, rubella immunity should be determined. If there is no evidence of immunity, females who are not pregnant should be vaccinated. If there is no evidence of immunity, females who are pregnant should delay immunization until after pregnancy.  Pneumococcal 13-valent conjugate (PCV13) vaccine.** / Consult your health care provider.  Pneumococcal polysaccharide (PPSV23) vaccine.** / 1 to 2 doses if you smoke cigarettes or if you have certain conditions.  Meningococcal vaccine.** /  1 dose if you are age 19 to 21 years and a first-year college  student living in a residence hall, or have one of several medical conditions, you need to get vaccinated against meningococcal disease. You may also need additional booster doses.  Hepatitis A vaccine.** / Consult your health care provider.  Hepatitis B vaccine.** / Consult your health care provider.  Haemophilus influenzae type b (Hib) vaccine.** / Consult your health care provider. Ages 40 to 64 years  Blood pressure check.** / Every 1 to 2 years.  Lipid and cholesterol check.** / Every 5 years beginning at age 20 years.  Lung cancer screening. / Every year if you are aged 55-80 years and have a 30-pack-year history of smoking and currently smoke or have quit within the past 15 years. Yearly screening is stopped once you have quit smoking for at least 15 years or develop a health problem that would prevent you from having lung cancer treatment.  Clinical breast exam.** / Every year after age 40 years.  BRCA-related cancer risk assessment.** / For women who have family members with a BRCA-related cancer (breast, ovarian, tubal, or peritoneal cancers).  Mammogram.** / Every year beginning at age 40 years and continuing for as long as you are in good health. Consult with your health care provider.  Pap test.** / Every 3 years starting at age 30 years through age 65 or 70 years with a history of 3 consecutive normal Pap tests.  HPV screening.** / Every 3 years from ages 30 years through ages 65 to 70 years with a history of 3 consecutive normal Pap tests.  Fecal occult blood test (FOBT) of stool. / Every year beginning at age 50 years and continuing until age 75 years. You may not need to do this test if you get a colonoscopy every 10 years.  Flexible sigmoidoscopy or colonoscopy.** / Every 5 years for a flexible sigmoidoscopy or every 10 years for a colonoscopy beginning at age 50 years and continuing until age 75 years.  Hepatitis C blood test.** / For all people born from 1945 through  1965 and any individual with known risks for hepatitis C.  Skin self-exam. / Monthly.  Influenza vaccine. / Every year.  Tetanus, diphtheria, and acellular pertussis (Tdap/Td) vaccine.** / Consult your health care provider. Pregnant women should receive 1 dose of Tdap vaccine during each pregnancy. 1 dose of Td every 10 years.  Varicella vaccine.** / Consult your health care provider. Pregnant females who do not have evidence of immunity should receive the first dose after pregnancy.  Zoster vaccine.** / 1 dose for adults aged 60 years or older.  Measles, mumps, rubella (MMR) vaccine.** / You need at least 1 dose of MMR if you were born in 1957 or later. You may also need a 2nd dose. For females of childbearing age, rubella immunity should be determined. If there is no evidence of immunity, females who are not pregnant should be vaccinated. If there is no evidence of immunity, females who are pregnant should delay immunization until after pregnancy.  Pneumococcal 13-valent conjugate (PCV13) vaccine.** / Consult your health care provider.  Pneumococcal polysaccharide (PPSV23) vaccine.** / 1 to 2 doses if you smoke cigarettes or if you have certain conditions.  Meningococcal vaccine.** / Consult your health care provider.  Hepatitis A vaccine.** / Consult your health care provider.  Hepatitis B vaccine.** / Consult your health care provider.  Haemophilus influenzae type b (Hib) vaccine.** / Consult your health care provider. Ages 65   years and over  Blood pressure check.** / Every 1 to 2 years.  Lipid and cholesterol check.** / Every 5 years beginning at age 22 years.  Lung cancer screening. / Every year if you are aged 73-80 years and have a 30-pack-year history of smoking and currently smoke or have quit within the past 15 years. Yearly screening is stopped once you have quit smoking for at least 15 years or develop a health problem that would prevent you from having lung cancer  treatment.  Clinical breast exam.** / Every year after age 4 years.  BRCA-related cancer risk assessment.** / For women who have family members with a BRCA-related cancer (breast, ovarian, tubal, or peritoneal cancers).  Mammogram.** / Every year beginning at age 40 years and continuing for as long as you are in good health. Consult with your health care provider.  Pap test.** / Every 3 years starting at age 9 years through age 34 or 91 years with 3 consecutive normal Pap tests. Testing can be stopped between 65 and 70 years with 3 consecutive normal Pap tests and no abnormal Pap or HPV tests in the past 10 years.  HPV screening.** / Every 3 years from ages 57 years through ages 64 or 45 years with a history of 3 consecutive normal Pap tests. Testing can be stopped between 65 and 70 years with 3 consecutive normal Pap tests and no abnormal Pap or HPV tests in the past 10 years.  Fecal occult blood test (FOBT) of stool. / Every year beginning at age 15 years and continuing until age 17 years. You may not need to do this test if you get a colonoscopy every 10 years.  Flexible sigmoidoscopy or colonoscopy.** / Every 5 years for a flexible sigmoidoscopy or every 10 years for a colonoscopy beginning at age 86 years and continuing until age 71 years.  Hepatitis C blood test.** / For all people born from [birth year] through 1963 and any individual with known risks for hepatitis C.  Osteoporosis screening.** / A one-time screening for women ages 83 years and over and women at risk for fractures or osteoporosis.  Skin self-exam. / Monthly.  Influenza vaccine. / Every year.  Tetanus, diphtheria, and acellular pertussis (Tdap/Td) vaccine.** / 1 dose of Td every 10 years.  Varicella vaccine.** / Consult your health care provider.  Zoster vaccine.** / 1 dose for adults aged 61 years or older.  Pneumococcal 13-valent conjugate (PCV13) vaccine.** / Consult your health care provider.  Pneumococcal  polysaccharide (PPSV23) vaccine.** / 1 dose for all adults aged 28 years and older.  Meningococcal vaccine.** / Consult your health care provider.  Hepatitis A vaccine.** / Consult your health care provider.  Hepatitis B vaccine.** / Consult your health care provider.  Haemophilus influenzae type b (Hib) vaccine.** / Consult your health care provider. ** Family history and personal history of risk and conditions may change your health care provider's recommendations. Document Released: 08/03/2001 Document Revised: 10/22/2013 Document Reviewed: 11/02/2010 [facility] Patient Information 2015 [place], [place]. This information is not intended to replace advice given to you by your health care provider. Make sure you discuss any questions you have with your health care provider.

## 2014-12-05 NOTE — Progress Notes (Signed)
Subjective:  Patient ID: Evelyn [name], female    DOB: March 14, 1925  Age: 81 y.o. MRN: [id number]  CC: Depression and Annual Exam   HPI Evelyn [name] presents for a CPX - she has occasional constipation but linzess helps. She offers no new complaints.  Outpatient Prescriptions Prior to Visit  Medication Sig Dispense Refill  . Linaclotide (LINZESS) 145 MCG CAPS capsule Take 1 capsule (145 mcg total) by mouth daily. 30 capsule 11  . Omega-3 Fatty Acids (FISH OIL) 1000 MG CAPS Take 1 each by mouth daily.     Marland Kitchen FLUoxetine (PROZAC) 20 MG capsule Take 2 capsules (40 mg total) by mouth daily. (Patient taking differently: Take 20 mg by mouth daily. ) 180 capsule 3  . zolpidem (AMBIEN) 10 MG tablet Take 0.5 tablets (5 mg total) by mouth at bedtime as needed for sleep. 30 tablet 3   No facility-administered medications prior to visit.    ROS Review of Systems  Constitutional: Negative.  Negative for fever, chills, diaphoresis, appetite change and fatigue.  HENT: Negative.  Negative for trouble swallowing and voice change.   Eyes: Negative.   Respiratory: Negative.  Negative for cough, choking, chest tightness, shortness of breath and stridor.   Cardiovascular: Negative.  Negative for chest pain, palpitations and leg swelling.  Gastrointestinal: Positive for constipation. Negative for nausea, abdominal pain, diarrhea, blood in stool, abdominal distention, anal bleeding and rectal pain.  Endocrine: Negative.   Genitourinary: Negative.  Negative for dysuria, urgency, frequency, enuresis and difficulty urinating.  Musculoskeletal: Negative.  Negative for myalgias, back pain, joint swelling and arthralgias.  Skin: Negative.  Negative for rash.  Allergic/Immunologic: Negative.   Neurological: Negative.  Negative for dizziness, syncope, speech difficulty, light-headedness, numbness and headaches.  Hematological: Negative.  Negative for adenopathy. Does not bruise/bleed easily.    Psychiatric/Behavioral: Positive for sleep disturbance and dysphoric mood. Negative for suicidal ideas, hallucinations, behavioral problems, confusion, self-injury, decreased concentration and agitation. The patient is not nervous/anxious and is not hyperactive.     Objective:  BP 120/88 mmHg  Pulse 79  Temp(Src) 97.6 F (36.4 C) (Oral)  Resp 16  Ht  (1.626 m)  Wt 115 lb (52.164 kg)  BMI 19.73 kg/m2  SpO2 98%  BP Readings from Last 3 Encounters:  12/05/14 120/88  04/11/14 110/70  04/02/14 150/71    Wt Readings from Last 3 Encounters:  12/05/14 115 lb (52.164 kg)  04/11/14 113 lb 4 oz (51.37 kg)  04/02/14 110 lb 3.7 oz (50 kg)    Physical Exam  Constitutional: She is oriented to person, place, and time. She appears well-developed and well-nourished. No distress.  HENT:  Head: Normocephalic and atraumatic.  Mouth/Throat: Oropharynx is clear and moist. No oropharyngeal exudate.  Eyes: Conjunctivae are normal. Right eye exhibits no discharge. Left eye exhibits no discharge. No scleral icterus.  Neck: Normal range of motion. Neck supple. No JVD present. No tracheal deviation present. No thyromegaly present.  Cardiovascular: Normal rate, regular rhythm and intact distal pulses.  Exam reveals no gallop and no friction rub.   No murmur heard. Pulmonary/Chest: Effort normal and breath sounds normal. No stridor. No respiratory distress. She has no wheezes. She has no rales. She exhibits no tenderness.  Abdominal: Soft. Bowel sounds are normal. She exhibits no distension and no mass. There is no tenderness. There is no rebound and no guarding.  Musculoskeletal: Normal range of motion. She exhibits no edema or tenderness.  Lymphadenopathy:    She has no  cervical adenopathy.  Neurological: She is oriented to person, place, and time.  Skin: Skin is warm and dry. No rash noted. She is not diaphoretic. No erythema. No pallor.  Psychiatric: She has a normal mood and affect. Her  behavior is normal. Judgment and thought content normal.  Vitals reviewed.   Lab Results  Component Value Date   WBC 4.7 12/05/2014   HGB 13.6 12/05/2014   HCT 40.9 12/05/2014   PLT 221.0 12/05/2014   GLUCOSE 88 12/05/2014   CHOL 206* 12/05/2014   TRIG 73.0 12/05/2014   HDL 63.80 12/05/2014   LDLDIRECT 146.5 05/03/2013   LDLCALC 128* 12/05/2014   ALT 18 12/05/2014   AST 32 12/05/2014   NA 134* 12/05/2014   K 4.5 12/05/2014   CL 102 12/05/2014   CREATININE 0.93 12/05/2014   BUN 22 12/05/2014   CO2 25 12/05/2014   TSH 1.35 12/05/2014   INR 1.1* 08/13/2010   HGBA1C 5.7 12/05/2014    Dg Chest 2 View  04/01/2014   CLINICAL DATA:  Hepatitis, hematuria, loss of consciousness  EXAM: CHEST  2 VIEW  COMPARISON:  06/24/2008  FINDINGS: The heart size and mediastinal contours are within normal limits. Both lungs are clear. The visualized skeletal structures are unremarkable.  IMPRESSION: No active cardiopulmonary disease.   Electronically Signed   By: Elige Ko   On: 04/01/2014 18:26   Ct Head Wo Contrast  04/02/2014   CLINICAL DATA:  Syncopal episode today.  EXAM: CT HEAD WITHOUT CONTRAST  TECHNIQUE: Contiguous axial images were obtained from the base of the skull through the vertex without intravenous contrast.  COMPARISON:  CT of the head February 06, 2006  FINDINGS: Effaced RIGHT frontal horn of the lateral ventricle, the septum pellucidum is tented towards the RIGHT. Minimal subependymal calcifications are unchanged. No hydrocephalus. No intraparenchymal hemorrhage, mass effect nor midline shift. Patchy supratentorial white matter hypodensities are within normal range for patient's age and though non-specific suggest sequelae of chronic small vessel ischemic disease. No acute large vascular territory infarcts.  No abnormal extra-axial fluid collections. Basal cisterns are patent. Moderate calcific atherosclerosis of the carotid siphons.  No skull fracture. The included ocular globes and  orbital contents are non-suspicious. LEFT ocular lens implant. The mastoid aircells and included paranasal sinuses are well-aerated.  IMPRESSION: No acute intracranial process.  Stable appearance of the head: Effaced RIGHT frontal horn of the lateral ventricle with calcifications and suspected adhesions suggests remote infectious or inflammatory process.   Electronically Signed   By: Awilda Metro   On: 04/02/2014 01:05    Assessment & Plan:   Murphie was seen today for depression and annual exam.  Diagnoses and all orders for this visit:  HYPERLIPIDEMIA, MIXED - she has achieved her LDL goal Orders: -     Lipid panel; Future  Hyperglycemia - she has prediabetes Orders: -     Comprehensive metabolic panel; Future -     Hemoglobin A1c; Future  Other constipation - her labs do not reveal any secondary causes for this, will treat for idiopathic constipation Orders: -     Comprehensive metabolic panel; Future -     CBC with Differential/Platelet; Future -     TSH; Future  Insomnia Orders: -     zolpidem (AMBIEN) 10 MG tablet; Take 0.5 tablets (5 mg total) by mouth at bedtime as needed for sleep.  Depression with somatization Orders: -     FLUoxetine (PROZAC) 20 MG capsule; Take 1 capsule (20 mg  total) by mouth daily.  I have changed Ms. Howdyshell's FLUoxetine. I am also having her maintain her Fish Oil, Linaclotide, and zolpidem.  Meds ordered this encounter  Medications  . FLUoxetine (PROZAC) 20 MG capsule    Sig: Take 1 capsule (20 mg total) by mouth daily.    Dispense:  90 capsule    Refill:  3  . zolpidem (AMBIEN) 10 MG tablet    Sig: Take 0.5 tablets (5 mg total) by mouth at bedtime as needed for sleep.    Dispense:  30 tablet    Refill:  3     Follow-up: Return in about 6 months (around 06/06/2015).  Sanda Linger, MD

## 2014-12-09 NOTE — Assessment & Plan Note (Signed)

## 2015-01-28 DIAGNOSIS — N390 Urinary tract infection, site not specified: Secondary | ICD-10-CM | POA: Diagnosis not present

## 2015-01-30 ENCOUNTER — Inpatient Hospital Stay (HOSPITAL_COMMUNITY): Payer: Medicare Other

## 2015-01-30 ENCOUNTER — Encounter (HOSPITAL_COMMUNITY): Payer: Self-pay | Admitting: *Deleted

## 2015-01-30 ENCOUNTER — Inpatient Hospital Stay (HOSPITAL_COMMUNITY)
Admission: EM | Admit: 2015-01-30 | Discharge: 2015-02-01 | DRG: 641 | Disposition: A | Discharge status: Home / Self Care | Payer: Medicare Other | Attending: Internal Medicine | Admitting: Internal Medicine

## 2015-01-30 DIAGNOSIS — K449 Diaphragmatic hernia without obstruction or gangrene: Secondary | ICD-10-CM | POA: Diagnosis not present

## 2015-01-30 DIAGNOSIS — K589 Irritable bowel syndrome without diarrhea: Secondary | ICD-10-CM | POA: Diagnosis present

## 2015-01-30 DIAGNOSIS — N179 Acute kidney failure, unspecified: Secondary | ICD-10-CM | POA: Diagnosis present

## 2015-01-30 DIAGNOSIS — Z79899 Other long term (current) drug therapy: Secondary | ICD-10-CM

## 2015-01-30 DIAGNOSIS — E871 Hypo-osmolality and hyponatremia: Principal | ICD-10-CM | POA: Diagnosis present

## 2015-01-30 DIAGNOSIS — R109 Unspecified abdominal pain: Secondary | ICD-10-CM

## 2015-01-30 DIAGNOSIS — E785 Hyperlipidemia, unspecified: Secondary | ICD-10-CM | POA: Diagnosis present

## 2015-01-30 DIAGNOSIS — Z87440 Personal history of urinary (tract) infections: Secondary | ICD-10-CM

## 2015-01-30 DIAGNOSIS — G4700 Insomnia, unspecified: Secondary | ICD-10-CM

## 2015-01-30 DIAGNOSIS — N3020 Other chronic cystitis without hematuria: Secondary | ICD-10-CM | POA: Diagnosis present

## 2015-01-30 DIAGNOSIS — H540 Blindness, both eyes: Secondary | ICD-10-CM | POA: Diagnosis present

## 2015-01-30 DIAGNOSIS — R5383 Other fatigue: Secondary | ICD-10-CM

## 2015-01-30 DIAGNOSIS — E872 Acidosis, unspecified: Secondary | ICD-10-CM

## 2015-01-30 DIAGNOSIS — N390 Urinary tract infection, site not specified: Secondary | ICD-10-CM | POA: Diagnosis not present

## 2015-01-30 DIAGNOSIS — K5900 Constipation, unspecified: Secondary | ICD-10-CM | POA: Diagnosis present

## 2015-01-30 DIAGNOSIS — K5904 Chronic idiopathic constipation: Secondary | ICD-10-CM | POA: Diagnosis present

## 2015-01-30 DIAGNOSIS — R102 Pelvic and perineal pain: Secondary | ICD-10-CM | POA: Diagnosis present

## 2015-01-30 HISTORY — DX: Legal blindness, as defined in USA: H54.8

## 2015-01-30 LAB — CBC
HEMATOCRIT: 37.2 % (ref 36.0–46.0)
Hemoglobin: 12.5 g/dL (ref 12.0–15.0)
MCH: 30.9 pg (ref 26.0–34.0)
MCHC: 33.6 g/dL (ref 30.0–36.0)
MCV: 91.9 fL (ref 78.0–100.0)
Platelets: 231 10*3/uL (ref 150–400)
RBC: 4.05 MIL/uL (ref 3.87–5.11)
RDW: 13.2 % (ref 11.5–15.5)
WBC: 4.8 10*3/uL (ref 4.0–10.5)

## 2015-01-30 LAB — URINALYSIS, ROUTINE W REFLEX MICROSCOPIC
BILIRUBIN URINE: NEGATIVE
Glucose, UA: NEGATIVE mg/dL
Hgb urine dipstick: NEGATIVE
KETONES UR: NEGATIVE mg/dL
Leukocytes, UA: NEGATIVE
Nitrite: POSITIVE — AB
PROTEIN: NEGATIVE mg/dL
Specific Gravity, Urine: 1.013 (ref 1.005–1.030)
Urobilinogen, UA: 1 mg/dL (ref 0.0–1.0)
pH: 6.5 (ref 5.0–8.0)

## 2015-01-30 LAB — COMPREHENSIVE METABOLIC PANEL
ALT: 13 U/L — ABNORMAL LOW (ref 14–54)
AST: 31 U/L (ref 15–41)
Albumin: 3.6 g/dL (ref 3.5–5.0)
Alkaline Phosphatase: 78 U/L (ref 38–126)
Anion gap: 10 (ref 5–15)
BUN: 16 mg/dL (ref 6–20)
CALCIUM: 9.2 mg/dL (ref 8.9–10.3)
CHLORIDE: 98 mmol/L — AB (ref 101–111)
CO2: 21 mmol/L — AB (ref 22–32)
CREATININE: 1.2 mg/dL — AB (ref 0.44–1.00)
GFR calc Af Amer: 45 mL/min — ABNORMAL LOW (ref >60)
GFR, EST NON AFRICAN AMERICAN: 39 mL/min — AB (ref >60)
Glucose, Bld: 142 mg/dL — ABNORMAL HIGH (ref 65–99)
Potassium: 4.9 mmol/L (ref 3.5–5.1)
SODIUM: 129 mmol/L — AB (ref 135–145)
Total Bilirubin: 0.6 mg/dL (ref 0.3–1.2)
Total Protein: 6.8 g/dL (ref 6.5–8.1)

## 2015-01-30 LAB — URINE MICROSCOPIC-ADD ON

## 2015-01-30 LAB — I-STAT CG4 LACTIC ACID, ED
Lactic Acid, Venous: 2.37 mmol/L (ref 0.5–2.0)
Lactic Acid, Venous: 2.51 mmol/L (ref 0.5–2.0)

## 2015-01-30 LAB — LIPASE, BLOOD: Lipase: 28 U/L (ref 22–51)

## 2015-01-30 MED ORDER — ZOLPIDEM TARTRATE 5 MG PO TABS
5.0000 mg | ORAL_TABLET | Freq: Every evening | ORAL | Status: DC | PRN
  Administered 2015-01-30 – 2015-01-31 (×2): 5 mg via ORAL
  Filled 2015-01-30 (×2): qty 1

## 2015-01-30 MED ORDER — POLYETHYLENE GLYCOL 3350 17 G PO PACK
17.0000 g | PACK | Freq: Two times a day (BID) | ORAL | Status: DC
  Administered 2015-01-30 – 2015-02-01 (×4): 17 g via ORAL
  Filled 2015-01-30 (×4): qty 1

## 2015-01-30 MED ORDER — DEXTROSE 5 % IV SOLN
1.0000 g | Freq: Once | INTRAVENOUS | Status: AC
  Administered 2015-01-30: 1 g via INTRAVENOUS
  Filled 2015-01-30: qty 10

## 2015-01-30 MED ORDER — LINACLOTIDE 145 MCG PO CAPS
145.0000 ug | ORAL_CAPSULE | Freq: Every day | ORAL | Status: DC
  Administered 2015-01-30: 145 ug via ORAL
  Filled 2015-01-30 (×4): qty 1

## 2015-01-30 MED ORDER — ALBUTEROL SULFATE (2.5 MG/3ML) 0.083% IN NEBU: 2.5000 mg | INHALATION_SOLUTION | RESPIRATORY_TRACT | Status: DC | PRN

## 2015-01-30 MED ORDER — GUAIFENESIN-DM 100-10 MG/5ML PO SYRP: 5.0000 mL | ORAL_SOLUTION | ORAL | Status: DC | PRN

## 2015-01-30 MED ORDER — ONDANSETRON HCL 4 MG/2ML IJ SOLN: 4.0000 mg | Freq: Four times a day (QID) | INTRAMUSCULAR | Status: DC | PRN

## 2015-01-30 MED ORDER — ALPRAZOLAM 0.25 MG PO TABS: 0.2500 mg | ORAL_TABLET | Freq: Three times a day (TID) | ORAL | Status: DC | PRN

## 2015-01-30 MED ORDER — FLUOXETINE HCL 20 MG PO CAPS
20.0000 mg | ORAL_CAPSULE | Freq: Every day | ORAL | Status: DC
  Administered 2015-01-30 – 2015-02-01 (×3): 20 mg via ORAL
  Filled 2015-01-30 (×3): qty 1

## 2015-01-30 MED ORDER — ACETAMINOPHEN 325 MG PO TABS
650.0000 mg | ORAL_TABLET | Freq: Four times a day (QID) | ORAL | Status: DC | PRN
  Administered 2015-01-30: 650 mg via ORAL
  Filled 2015-01-30: qty 2

## 2015-01-30 MED ORDER — FISH OIL 1000 MG PO CAPS: 1.0000 | ORAL_CAPSULE | Freq: Every day | ORAL | Status: DC

## 2015-01-30 MED ORDER — CEPHALEXIN 500 MG PO CAPS: 500.0000 mg | ORAL_CAPSULE | Freq: Two times a day (BID) | ORAL | Status: DC

## 2015-01-30 MED ORDER — SODIUM CHLORIDE 0.9 % IV BOLUS (SEPSIS)
1000.0000 mL | Freq: Once | INTRAVENOUS | Status: AC
  Administered 2015-01-30: 1000 mL via INTRAVENOUS

## 2015-01-30 MED ORDER — SODIUM CHLORIDE 0.9 % IV SOLN
INTRAVENOUS | Status: DC
Start: 2015-01-30 — End: 2015-01-31

## 2015-01-30 MED ORDER — ACETAMINOPHEN 650 MG RE SUPP: 650.0000 mg | Freq: Four times a day (QID) | RECTAL | Status: DC | PRN

## 2015-01-30 MED ORDER — ONDANSETRON HCL 4 MG PO TABS: 4.0000 mg | ORAL_TABLET | Freq: Four times a day (QID) | ORAL | Status: DC | PRN

## 2015-01-30 MED ORDER — ACETAMINOPHEN 325 MG PO TABS
325.0000 mg | ORAL_TABLET | Freq: Once | ORAL | Status: AC
  Administered 2015-01-30: 325 mg via ORAL
  Filled 2015-01-30: qty 1

## 2015-01-30 MED ORDER — OMEGA-3-ACID ETHYL ESTERS 1 G PO CAPS
1.0000 g | ORAL_CAPSULE | Freq: Every day | ORAL | Status: DC
  Administered 2015-01-31 – 2015-02-01 (×2): 1 g via ORAL
  Filled 2015-01-30 (×2): qty 1

## 2015-01-30 MED ORDER — ENOXAPARIN SODIUM 40 MG/0.4ML ~~LOC~~ SOLN
40.0000 mg | Freq: Every day | SUBCUTANEOUS | Status: DC
  Administered 2015-01-30 – 2015-01-31 (×2): 40 mg via SUBCUTANEOUS
  Filled 2015-01-30 (×2): qty 0.4

## 2015-01-30 MED ORDER — SENNA 8.6 MG PO TABS
2.0000 | ORAL_TABLET | Freq: Every day | ORAL | Status: DC
  Administered 2015-01-30 – 2015-01-31 (×2): 17.2 mg via ORAL
  Filled 2015-01-30 (×2): qty 2

## 2015-01-30 MED ORDER — BISACODYL 5 MG PO TBEC: 5.0000 mg | DELAYED_RELEASE_TABLET | Freq: Every day | ORAL | Status: DC | PRN

## 2015-01-30 NOTE — ED Provider Notes (Signed)
CSN: [id number]     Arrival date & time 01/30/15  1246 History   First MD Initiated Contact with Patient 01/30/15 1411     Chief Complaint  Patient presents with  . Fatigue  . Urinary Tract Infection    HPI   Evelyn [name] is an 81 y.o. female with a PMH of HLD, IBS, and depression who presents to the ED with fatigue and recent UTI. She states she has not been feeling well since Saturday, and that she started to feel worse as the weekend progressed. She reports she felt like she had "a bladder infection," so she went to urgent care on Tuesday, at which time she was told she had a UTI and was treated with bactrim and pyridium. She states she completed her antibiotic course. She reports she felt like her symptoms initially improved; however, she states she feels fatigued and weak "all over." She reports dysuria, urgency, frequency, nausea. She also reports she has had a generalized, aching headache over her forehead for the last few days. She has tried ibuprofen for symptom relief. She denies fever, chills, vomiting, diarrhea, neck pain, back pain, flank pain, visual changes. She reports diffuse abdominal pain, which she attributes to IBS and states is unchanged from her abdominal pain at baseline.   Past Medical History  Diagnosis Date  . Hyperlipidemia   . Hepatitis, unspecified     statin related, resolved quickly  . H/O: hematuria   . Congenital blindness     right eye  . Chronic cystitis   . Syncope and collapse 04/01/2014  . UTI (lower urinary tract infection)     HISTORY OF UTI   Past Surgical History  Procedure Laterality Date  . Abdominal hysterectomy      vaginal  . Eye surgery      100% visual loss  . Bladder suspension      A-P   Family History  Problem Relation Age of Onset  . Cancer Neg Hx     Colon cancer  . Stroke Neg Hx   . Heart disease Neg Hx   . Hyperlipidemia Neg Hx   . Hypertension Neg Hx   . Kidney disease Neg Hx    Social History  Substance Use  Topics  . Smoking status: Never Smoker   . Smokeless tobacco: Never Used  . Alcohol Use: No   OB History    No data available      Review of Systems  Constitutional: Positive for activity change and fatigue. Negative for fever, chills, diaphoresis and appetite change.       Reports she has felt more tired than normal since this weekend.  Respiratory: Negative for shortness of breath.   Cardiovascular: Negative for chest pain, palpitations and leg swelling.  Gastrointestinal: Positive for nausea and abdominal pain. Negative for vomiting, diarrhea and abdominal distention.       Reports diffuse abdominal pain, which she attributes to IBS and states is unchanged from her baseline abdominal pain.  Genitourinary: Positive for dysuria, urgency and frequency. Negative for hematuria, flank pain and pelvic pain.  Musculoskeletal: Negative for myalgias, back pain, arthralgias, neck pain and neck stiffness.  Skin: Negative for color change, pallor, rash and wound.  Neurological: Positive for weakness and headaches. Negative for dizziness, syncope, light-headedness and numbness.       Reports generalized headache over her forehead. Reports she feels weak "all over."    Allergies  Review of patient's allergies indicates no known allergies.  Home  Medications   Prior to Admission medications   Medication Sig Start Date End Date Taking? Authorizing Provider  FLUoxetine (PROZAC) 20 MG capsule Take 1 capsule (20 mg total) by mouth daily. 12/05/14  Yes Etta Grandchild, MD  Linaclotide [facility]) 145 MCG CAPS capsule Take 1 capsule (145 mcg total) by mouth daily. 04/11/14  Yes Etta Grandchild, MD  Omega-3 Fatty Acids (FISH OIL) 1000 MG CAPS Take 1 each by mouth daily.    Yes Historical Provider, MD  sulfamethoxazole-trimethoprim (BACTRIM DS,SEPTRA DS) 800-160 MG per tablet Take 1 tablet by mouth 2 (two) times daily. 3 day course 01/28/15  Yes Historical Provider, MD  zolpidem (AMBIEN) 10 MG tablet Take 0.5  tablets (5 mg total) by mouth at bedtime as needed for sleep. 12/05/14  Yes Etta Grandchild, MD  cephALEXin (KEFLEX) 500 MG capsule Take 1 capsule (500 mg total) by mouth 2 (two) times daily. 01/30/15   Dorise Hiss Westfall, PA-C    BP 172/55 mmHg  Pulse 73  Temp(Src) 97.9 F (36.6 C) (Oral)  Resp 18  SpO2 99% Physical Exam  Constitutional: She is oriented to person, place, and time. She appears well-developed and well-nourished. No distress.  HENT:  Head: Normocephalic and atraumatic.  Right Ear: External ear normal.  Left Ear: External ear normal.  Nose: Nose normal.  Mouth/Throat: Oropharynx is clear and moist.  Eyes: Conjunctivae and EOM are normal. Pupils are equal, round, and reactive to light.  Neck: Normal range of motion. Neck supple.  Cardiovascular: Normal rate, regular rhythm, normal heart sounds and intact distal pulses.   Pulmonary/Chest: Effort normal and breath sounds normal. No respiratory distress. She has no wheezes. She has no rales.  Abdominal: Soft. Normal appearance and bowel sounds are normal. She exhibits no distension and no mass. There is tenderness in the suprapubic area. There is no rebound, no guarding and no CVA tenderness.  Mild tenderness to palpation of suprapubic region. No rebound, guarding, or mass.  Musculoskeletal: Normal range of motion. She exhibits no edema.  Lymphadenopathy:    She has no cervical adenopathy.  Neurological: She is alert and oriented to person, place, and time. She has normal strength. No cranial nerve deficit or sensory deficit.  Skin: Skin is warm and dry. No rash noted. She is not diaphoretic. No erythema. No pallor.  Psychiatric: She has a normal mood and affect. Her behavior is normal. Judgment and thought content normal.  Nursing note and vitals reviewed.   ED Course  Procedures (including critical care time)  Labs Review Labs Reviewed  COMPREHENSIVE METABOLIC PANEL - Abnormal; Notable for the following:    Sodium  129 (*)    Chloride 98 (*)    CO2 21 (*)    Glucose, Bld 142 (*)    Creatinine, Ser 1.20 (*)    ALT 13 (*)    GFR calc non Af Amer 39 (*)    GFR calc Af Amer 45 (*)    All other components within normal limits  URINALYSIS, ROUTINE W REFLEX MICROSCOPIC (NOT AT [facility]) - Abnormal; Notable for the following:    Nitrite POSITIVE (*)    All other components within normal limits  I-STAT CG4 LACTIC ACID, ED - Abnormal; Notable for the following:    Lactic Acid, Venous 2.37 (*)    All other components within normal limits  I-STAT CG4 LACTIC ACID, ED - Abnormal; Notable for the following:    Lactic Acid, Venous 2.51 (*)    All other  components within normal limits  URINE CULTURE  LIPASE, BLOOD  CBC  URINE MICROSCOPIC-ADD ON    Imaging Review No results found.    EKG Interpretation None      MDM   Final diagnoses:  UTI (lower urinary tract infection)  Other fatigue  Lactic acidosis  Acute kidney injury    81 year old female presents with fatigue, generalized weakness, dysuria, urgency, and frequency. Reports abdominal pain, which she attributes to IBS, and states is unchanged from her baseline abdominal pain. Denies fever, chills, hematuria, flank pain. Mild tenderness to palpation of suprapubic region of abdomen on exam. No CVA tenderness.   Patient HA treated with tylenol in the ED. Patient is afebrile with no focal neuro deficits, nuchal rigidity, or change in vision. Presentation non-concerning for [facility], ICH, meningitis, or temporal arteritis.   Labs remarkable for lactic acidosis at 2.37. CMP demonstrates sodium low at 129 and AKI with creatinine elevated at 1.2 and GFR low at 39. No leukocytosis on CBC. UA positive for nitrites, 3-6 WBC per hpf, consistent with UTI. Urine culture ordered.   Given 1L bolus normal saline and ceftriaxone. Recommended admission given failure with outpatient therapy. Initially, the patient refused admission, at which time Dr. Clarene Duke spoke with the  patient, and discussed the risks of going home. Repeat lactic acid elevated at 2.51. Discussed the significance of worsening lactic acidosis with the patient, and that we would recommend she be admitted to hospital medicine for further evaluation and management. Patient states she would agree to admission. Given additional 1L bolus normal saline in the ED. No tachycardia or tachypnea. Patient is afebrile.   5:29 PM Dr. Clarene Duke spoke with hospital medicine, who will plan to admit.           Mady Gemma, PA-C 01/30/15 1729  Laurence Spates, MD 02/02/15 [phone]

## 2015-01-30 NOTE — Progress Notes (Signed)
Admitted to 6n12 from ED, Denies nausea/pain at this time. Oriented to room and surroundings.

## 2015-01-30 NOTE — ED Notes (Signed)
Pt reports having fatigue and symptoms of UTI, pt went to [facility] and started on bactrim Tuesday but reports no relief. Having lower abd pain and nausea. Denies fever.

## 2015-01-30 NOTE — H&P (Signed)
PATIENT DETAILS Name: Evelyn [name] Age: 81 y.o. Sex: female Date of Birth: Nov 11, 1924 Admit Date: 01/30/2015 ZOX:[id number] Yetta Barre, MD Referring Physician:Dr little    CHIEF COMPLAINT:  Suprapubic discomfort-5 days Weakness/nausea-5 days  HPI: Evelyn [name] is a 81 y.o. female with a Past Medical History of constipation predominant irritable bowel syndrome, insomnia who presents today with the above noted complaint. Per patient, approximately a week back, patient started developing suprapubic discomfort and frequency of urination. This was associated with generalized weakness. She subsequently went to a local urgent care, where she was diagnosed with urinary tract infection and given a three-day course of Bactrim and Pyridium which she completed 1-2 days back. She continued to experience suprapubic discomfort and weakness, hence she presented to the emergency room for further evaluation. Patient denies any fever, she does endorse frequency and nocturia more than usual. However denies dysuria (has suprapubic discomfort irrespective of urination). She also denies hematuria or foul-smelling urine. She does endorse nausea without vomiting. There is no history of diarrhea or abdominal. She does claim to have a poor appetite for the past few days.    ALLERGIES:  No Known Allergies  PAST MEDICAL HISTORY: Past Medical History  Diagnosis Date  . Hyperlipidemia   . Hepatitis, unspecified     statin related, resolved quickly  . H/O: hematuria   . Congenital blindness     right eye  . Chronic cystitis   . Syncope and collapse 04/01/2014  . UTI (lower urinary tract infection)     HISTORY OF UTI    PAST SURGICAL HISTORY: Past Surgical History  Procedure Laterality Date  . Abdominal hysterectomy      vaginal  . Eye surgery      100% visual loss  . Bladder suspension      A-P    MEDICATIONS AT HOME: Prior to Admission medications   Medication Sig Start Date End Date  Taking? Authorizing Provider  FLUoxetine (PROZAC) 20 MG capsule Take 1 capsule (20 mg total) by mouth daily. 12/05/14  Yes Etta Grandchild, MD  Linaclotide [facility]) 145 MCG CAPS capsule Take 1 capsule (145 mcg total) by mouth daily. 04/11/14  Yes Etta Grandchild, MD  Omega-3 Fatty Acids (FISH OIL) 1000 MG CAPS Take 1 each by mouth daily.    Yes Historical Provider, MD  sulfamethoxazole-trimethoprim (BACTRIM DS,SEPTRA DS) 800-160 MG per tablet Take 1 tablet by mouth 2 (two) times daily. 3 day course 01/28/15  Yes Historical Provider, MD  zolpidem (AMBIEN) 10 MG tablet Take 0.5 tablets (5 mg total) by mouth at bedtime as needed for sleep. 12/05/14  Yes Etta Grandchild, MD    FAMILY HISTORY: Family History  Problem Relation Age of Onset  . Cancer Neg Hx     Colon cancer  . Stroke Neg Hx   . Heart disease Neg Hx   . Hyperlipidemia Neg Hx   . Hypertension Neg Hx   . Kidney disease Neg Hx     SOCIAL HISTORY:  reports that she has never smoked. She has never used smokeless tobacco. She reports that she does not drink alcohol or use illicit drugs. Lives at: Home Mobility: Independent  REVIEW OF SYSTEMS:  Constitutional:   No  weight loss, night sweats,  Fevers, chills  HEENT:    No headaches, Dysphagia,Tooth/dental problems,Sore throat,   Cardio-vascular: No chest pain,Orthopnea, PND,lower extremity edema, anasarca, palpitations  GI:  No heartburn, indigestion,  vomiting, diarrhea, melena or  hematochezia  Resp: No shortness of breath, cough, hemoptysis,plueritic chest pain.   Skin:  No rash or lesions.  GU:  No dysuria, change in color of urine, no urgency or frequency.  No flank pain.  Musculoskeletal: No joint pain or swelling.  No decreased range of motion.  No back pain.  Endocrine: No heat intolerance, no cold intolerance, no polyuria, no polydipsia  Psych: No change in mood or affect. No depression or anxiety.  No memory loss.   PHYSICAL EXAM: Blood pressure 172/55,  pulse 73, temperature 97.9 F (36.6 C), temperature source Oral, resp. rate 18, SpO2 99 %.  General appearance :Awake, alert, not in any distress. Speech Clear. Not toxic Looking HEENT: Atraumatic and Normocephalic, pupils equally reactive to light and accomodation Neck: supple, no JVD. No cervical lymphadenopathy.  Chest:Good air entry bilaterally, no added sounds  CVS: S1 S2 regular, no murmurs.  Abdomen: Bowel sounds present, abdomen is soft with very minimal tenderness in the suprapubic area. Note-no tenderness in the right lower quadrant or left lower quadrant. Abdomen is  not distended.  Extremities: B/L Lower Ext shows no edema, both legs are warm to touch Neurology:  Non focal Skin:No Rash Wounds:N/A  LABS ON ADMISSION:   Recent Labs  01/30/15 1300  NA 129*  K 4.9  CL 98*  CO2 21*  GLUCOSE 142*  BUN 16  CREATININE 1.20*  CALCIUM 9.2    Recent Labs  01/30/15 1300  AST 31  ALT 13*  ALKPHOS 78  BILITOT 0.6  PROT 6.8  ALBUMIN 3.6    Recent Labs  01/30/15 1300  LIPASE 28    Recent Labs  01/30/15 1300  WBC 4.8  HGB 12.5  HCT 37.2  MCV 91.9  PLT 231   No results for input(s): CKTOTAL, CKMB, CKMBINDEX, TROPONINI in the last 72 hours. No results for input(s): DDIMER in the last 72 hours. Invalid input(s): POCBNP   RADIOLOGIC STUDIES ON ADMISSION: No results found.   ASSESSMENT AND PLAN: Present on Admission:  . Hyponatremia: Suspect related to poor oral appetite and probably more free water intake. Cautiously hydrate, and recheck electrolytes in a.m. Since only mild, have not initiated any workup at this time-however if continues to have persistent hyponatremia-please check serum osmolality, urine osmolality and urine sodium along with a TSH.   . ARF (acute renal failure): Suspect prerenal azotemia in the setting of poor oral intake. Could have ARF from Bactrim as well. Hydrate and recheck electrolytes in a.m.   . Suprapubic pressure/pain: Claims  to have suprapubic discomfort all the time-irrespective of urination. UA not very suggestive of UTI- no fever or leukocytosis. Just completed a three-day course of Bactrim and has already received IV Rocephin here in the emergency room. Since she does not look toxic or appears acutely ill-hence I will not continue any antibiotics. She has not had a bowel movement since his past Tuesday, constipation could be causing her lower abdominal discomfort as well. We will check a abdominal x-ray, put her on MiraLAX and Senokot and follow clinical course. If pain were to worsen-or the patient were to have a fever or worsening leukocytosis-patient may require a CT scan/blood cultures. For now and monitor off antibiotics and provide supportive care.  . Constipation predominant IBS: Continue linzess. start scheduled Senokot/MiraLAX. Not sure if this is causing above.   . Insomnia: Continue Ambien   Further plan will depend as patient's clinical course evolves and further radiologic and laboratory data become available. Patient will be  monitored closely.  Above noted plan was discussed with patientdaughter- face to face at bedside, they were in agreement.   CONSULTS: None  DVT Prophylaxis: Prophylactic Lovenox   Code Status: Full Code  Disposition Plan:  Discharge back home possibly in 1-2 days,but may warrant SNF depending on clinical course  Total time spent  55 minutes.Greater than 50% of this time was spent in counseling, explanation of diagnosis, planning of further management, and coordination of care.  [facility] Pager [phone]  If 7PM-7AM, please contact night-coverage [email or web address] Password [facility] 01/30/2015, 5:40 PM

## 2015-01-31 DIAGNOSIS — K5900 Constipation, unspecified: Secondary | ICD-10-CM

## 2015-01-31 DIAGNOSIS — N179 Acute kidney failure, unspecified: Secondary | ICD-10-CM

## 2015-01-31 DIAGNOSIS — E871 Hypo-osmolality and hyponatremia: Principal | ICD-10-CM

## 2015-01-31 DIAGNOSIS — N390 Urinary tract infection, site not specified: Secondary | ICD-10-CM

## 2015-01-31 LAB — CBC
HCT: 32.5 % — ABNORMAL LOW (ref 36.0–46.0)
Hemoglobin: 10.8 g/dL — ABNORMAL LOW (ref 12.0–15.0)
MCH: 30.3 pg (ref 26.0–34.0)
MCHC: 33.2 g/dL (ref 30.0–36.0)
MCV: 91 fL (ref 78.0–100.0)
Platelets: 178 10*3/uL (ref 150–400)
RBC: 3.57 MIL/uL — ABNORMAL LOW (ref 3.87–5.11)
RDW: 13.2 % (ref 11.5–15.5)
WBC: 4.7 10*3/uL (ref 4.0–10.5)

## 2015-01-31 LAB — LACTIC ACID, PLASMA: Lactic Acid, Venous: 0.7 mmol/L (ref 0.5–2.0)

## 2015-01-31 LAB — BASIC METABOLIC PANEL
Anion gap: 4 — ABNORMAL LOW (ref 5–15)
BUN: 13 mg/dL (ref 6–20)
CO2: 22 mmol/L (ref 22–32)
Calcium: 8.4 mg/dL — ABNORMAL LOW (ref 8.9–10.3)
Chloride: 108 mmol/L (ref 101–111)
Creatinine, Ser: 0.99 mg/dL (ref 0.44–1.00)
GFR calc Af Amer: 57 mL/min — ABNORMAL LOW (ref >60)
GFR calc non Af Amer: 49 mL/min — ABNORMAL LOW (ref >60)
GLUCOSE: 95 mg/dL (ref 65–99)
POTASSIUM: 4.6 mmol/L (ref 3.5–5.1)
Sodium: 134 mmol/L — ABNORMAL LOW (ref 135–145)

## 2015-01-31 MED ORDER — DEXTROSE 5 % IV SOLN
1.0000 g | INTRAVENOUS | Status: DC
  Administered 2015-01-31 – 2015-02-01 (×2): 1 g via INTRAVENOUS
  Filled 2015-01-31 (×3): qty 10

## 2015-01-31 NOTE — Evaluation (Signed)
Physical Therapy Evaluation and Discharge Patient Details Name: Evelyn [name] MRN: [id number] DOB: July 10, 1924 Today's Date: 01/31/2015   History of Present Illness  81 y.o. female admitted with hyponatremia, and acute renal failure.  Clinical Impression  Patient evaluated by Physical Therapy with no further acute PT needs identified. All education has been completed and the patient has no further questions. Demonstrates instability with higher level dynamic gait tasks. Dynmic Gait Index score of 15 (<19 increased fall risk.) Agreeable to use cane and follow-up with physical therapy on an outpatient basis. Daughter in room, very supportive. See below for any follow-up Physial Therapy or equipment needs. PT is signing off. Thank you for this referral.     Follow Up Recommendations Outpatient PT;Supervision - Intermittent    Equipment Recommendations  None recommended by PT    Recommendations for Other Services       Precautions / Restrictions Precautions Precautions: Fall Restrictions Weight Bearing Restrictions: No      Mobility  Bed Mobility Overal bed mobility: Independent                Transfers Overall transfer level: Needs assistance Equipment used: None Transfers: Sit to/from Stand Sit to Stand: Supervision         General transfer comment: Supervision for safety from bed. Minimal sway noted. Able to self correct  Ambulation/Gait Ambulation/Gait assistance: Min assist Ambulation Distance (Feet): 350 Feet Assistive device: None Gait Pattern/deviations: Step-through pattern;Decreased stride length;Drifts right/left Gait velocity: decreased   General Gait Details: Pt ambulates with slight leftward drift, VC for awareness and to to correct which she is able to do, with further cues to continue scanning environment. Majority of bout at a supervision level however when dynamic tasks are introduced pt with increased sway and 2 instances of loss of balance with  stepping over objects and stepping around objects in narrow area, requiring min assist to correct.  Stairs Stairs: Yes Stairs assistance: Supervision Stair Management: One rail Right;Alternating pattern;Forwards Number of Stairs: 3 General stair comments: Performed with single rail, not comfortable without rail use, alternating pattern. Supervision for safety, no loss of balance.  Wheelchair Mobility    Modified Rankin (Stroke Patients Only)       Balance Overall balance assessment: Needs assistance Sitting-balance support: No upper extremity supported;Feet supported Sitting balance-Leahy Scale: Normal     Standing balance support: No upper extremity supported Standing balance-Leahy Scale: Fair                   Standardized Balance Assessment Standardized Balance Assessment : Dynamic Gait Index   Dynamic Gait Index Level Surface: Mild Impairment Change in Gait Speed: Normal Gait with Horizontal Head Turns: Normal Gait with Vertical Head Turns: Mild Impairment Gait and Pivot Turn: Normal Step Over Obstacle: Severe Impairment Step Around Obstacles: Severe Impairment Steps: Mild Impairment Total Score: 15       Pertinent Vitals/Pain Pain Assessment: No/denies pain    Home Living Family/patient expects to be discharged to:: Private residence Living Arrangements: Alone Available Help at Discharge: Family Type of Home: House Home Access: Stairs to enter Entrance Stairs-Rails: Right Entrance Stairs-Number of Steps: 3 Home Layout: Two level;Able to live on main level with bedroom/bathroom Home Equipment: Gilmer Mor - single point      Prior Function Level of Independence: Independent               Hand Dominance   Dominant Hand: Right    Extremity/Trunk Assessment   Upper Extremity Assessment: Defer  to OT evaluation           Lower Extremity Assessment: Overall WFL for tasks assessed         Communication   Communication: No difficulties   Cognition Arousal/Alertness: Awake/alert Behavior During Therapy: WFL for tasks assessed/performed Overall Cognitive Status: Within Functional Limits for tasks assessed                      General Comments General comments (skin integrity, edema, etc.): Discussed results of DGI and potential fall risk. Agrees to use cane temporarily and follow up with outpatient physical therapy. Daughter present and very supportive    Exercises        Assessment/Plan    PT Assessment Patent does not need any further PT services  PT Diagnosis Abnormality of gait   PT Problem List    PT Treatment Interventions     PT Goals (Current goals can be found in the Care Plan section) Acute Rehab PT Goals Patient Stated Goal: None stated PT Goal Formulation: All assessment and education complete, DC therapy    Frequency     Barriers to discharge        Co-evaluation               End of Session Equipment Utilized During Treatment: Gait belt Activity Tolerance: Patient tolerated treatment well Patient left: in bed;with call bell/phone within reach;with family/visitor present Nurse Communication: Mobility status         Time: 4098-1191 PT Time Calculation (min) (ACUTE ONLY): 18 min   Charges:   PT Evaluation $Initial PT Evaluation Tier I: 1 Procedure     PT G CodesBerton Mount 01/31/2015, 12:14 PM Charlsie Merles, [place] [phone]

## 2015-01-31 NOTE — Progress Notes (Signed)
Pt had large amt stool return from [facility].

## 2015-01-31 NOTE — Progress Notes (Signed)
[facility] PROGRESS NOTE  Evelyn [name] WUJ:[id number] DOB: [birth year] DOA: 01/30/2015 PCP: Sanda Linger, MD  Assessment/Plan: #1 hyponatremia Secondary to hypovolemic hyponatremia secondary to poor oral intake. Sodium levels improved with hydration. Follow.  #2 acute renal failure Secondary to prerenal azotemia in the setting of decreased oral intake. Patient was also noted to be on Bactrim prior to admission. Renal function improved on hydration. Follow.  #3 constipation/IBS Soapsuds enema. Continue linzess. Continue MiraLAX and Senokot. Follow.  #4/? UTI versus interstitial cystitis Patient complaining of suprapubic abdominal pain. Patient complaining of dysuria this morning. Patient recently treated with Bactrim for UTI. Urinalysis on admission was nitrite positive. Leukocytes -3-6 WBCs. Patient received a dose of IV Rocephin in the emergency room. Urine cultures are pending. Will place on IV Rocephin as patient is complaining of symptoms. Monitor.  #5 prophylaxis Lovenox for DVT prophylaxis.  Code Status: Full Family Communication: Updated patient, daughter at bedside. Updated son via telephone. Disposition Plan: Home tomorrow   Consultants:  None  Procedures:  Acute abdominal series 01/30/2015   Antibiotics:  IV Rocephin 01/30/2015  HPI/Subjective: Patient with complaints of suprapubic pain. Patient states she has some dysuria today however can't remember whether she had dysuria yesterday. No chest. No shortness of breath. Patient with no bowel movement this morning.  Objective: Filed Vitals:   01/31/15 0500  BP: 163/58  Pulse: 64  Temp: 98 F (36.7 C)  Resp: 18    Intake/Output Summary (Last 24 hours) at 01/31/15 1043 Last data filed at 01/30/15 1650  Gross per 24 hour  Intake   1000 ml  Output      0 ml  Net   1000 ml   There were no vitals filed for this visit.  Exam:   General:  NAD  Cardiovascular: RRR  Respiratory: CTAB  Abdomen:  Soft, tenderness to palpation in the suprapubic region, nondistended, positive bowel sounds.  Musculoskeletal: No clubbing cyanosis or edema.  Data Reviewed: Basic Metabolic Panel:  Recent Labs Lab 01/30/15 1300 01/31/15 0412  NA 129* 134*  K 4.9 4.6  CL 98* 108  CO2 21* 22  GLUCOSE 142* 95  BUN 16 13  CREATININE 1.20* 0.99  CALCIUM 9.2 8.4*   Liver Function Tests:  Recent Labs Lab 01/30/15 1300  AST 31  ALT 13*  ALKPHOS 78  BILITOT 0.6  PROT 6.8  ALBUMIN 3.6    Recent Labs Lab 01/30/15 1300  LIPASE 28   No results for input(s): AMMONIA in the last 168 hours. CBC:  Recent Labs Lab 01/30/15 1300 01/31/15 0412  WBC 4.8 4.7  HGB 12.5 10.8*  HCT 37.2 32.5*  MCV 91.9 91.0  PLT 231 178   Cardiac Enzymes: No results for input(s): CKTOTAL, CKMB, CKMBINDEX, TROPONINI in the last 168 hours. BNP (last 3 results) No results for input(s): BNP in the last 8760 hours.  ProBNP (last 3 results) No results for input(s): PROBNP in the last 8760 hours.  CBG: No results for input(s): GLUCAP in the last 168 hours.  No results found for this or any previous visit (from the past 240 hour(s)).   Studies: Dg Abd Acute W/chest  01/30/2015   CLINICAL DATA:  81 year old female with abdominal pain for the past 2 days.  EXAM: DG ABDOMEN ACUTE W/ 1V CHEST  COMPARISON:  Chest x-ray 04/01/2014.  FINDINGS: Lung volumes are normal. No consolidative airspace disease. No pleural effusions. No pneumothorax. No evidence of pulmonary edema. No suspicious appearing pulmonary nodules or masses. Heart  size is normal. Upper mediastinal contours are within normal limits. Moderate hiatal hernia again noted.  Gas and stool are seen scattered throughout the colon extending to the level of the distal rectum. No pathologic distension of small bowel is noted. No gross evidence of pneumoperitoneum. Numerous pelvic phleboliths are noted.  IMPRESSION: 1.   1.  Nonobstructive bowel gas pattern. 2. No  pneumoperitoneum. 3. No radiographic evidence of acute cardiopulmonary disease. 4. Atherosclerosis. 5. Moderate size hiatal hernia.   Electronically Signed   By: Trudie Reed M.D.   On: 01/30/2015 20:21    Scheduled Meds: . enoxaparin (LOVENOX) injection  40 mg Subcutaneous QHS  . FLUoxetine  20 mg Oral Daily  . Linaclotide  145 mcg Oral Daily  . omega-3 acid ethyl esters  1 g Oral Daily  . polyethylene glycol  17 g Oral BID  . senna  2 tablet Oral QHS   Continuous Infusions: . sodium chloride      Principal Problem:   Hyponatremia Active Problems:   Insomnia   Constipation   ARF (acute renal failure)   Suprapubic pressure    Time spent: 35 minus    THOMPSON,DANIEL M.D. [facility] Pager [phone]. If 7PM-7AM, please contact night-coverage at [email or web address], password [facility] 01/31/2015, 10:43 AM  LOS: 1 day

## 2015-02-01 DIAGNOSIS — R109 Unspecified abdominal pain: Secondary | ICD-10-CM

## 2015-02-01 DIAGNOSIS — K589 Irritable bowel syndrome without diarrhea: Secondary | ICD-10-CM | POA: Insufficient documentation

## 2015-02-01 LAB — BASIC METABOLIC PANEL
Anion gap: 5 (ref 5–15)
BUN: 12 mg/dL (ref 6–20)
CO2: 22 mmol/L (ref 22–32)
Calcium: 8.9 mg/dL (ref 8.9–10.3)
Chloride: 110 mmol/L (ref 101–111)
Creatinine, Ser: 1.04 mg/dL — ABNORMAL HIGH (ref 0.44–1.00)
GFR, EST AFRICAN AMERICAN: 54 mL/min — AB (ref >60)
GFR, EST NON AFRICAN AMERICAN: 47 mL/min — AB (ref >60)
Glucose, Bld: 98 mg/dL (ref 65–99)
Potassium: 4.8 mmol/L (ref 3.5–5.1)
Sodium: 137 mmol/L (ref 135–145)

## 2015-02-01 LAB — CBC
HCT: 34.5 % — ABNORMAL LOW (ref 36.0–46.0)
Hemoglobin: 11.4 g/dL — ABNORMAL LOW (ref 12.0–15.0)
MCH: 30.3 pg (ref 26.0–34.0)
MCHC: 33 g/dL (ref 30.0–36.0)
MCV: 91.8 fL (ref 78.0–100.0)
PLATELETS: 186 10*3/uL (ref 150–400)
RBC: 3.76 MIL/uL — ABNORMAL LOW (ref 3.87–5.11)
RDW: 13.4 % (ref 11.5–15.5)
WBC: 3.7 10*3/uL — AB (ref 4.0–10.5)

## 2015-02-01 LAB — URINE CULTURE

## 2015-02-01 MED ORDER — SENNA 8.6 MG PO TABS: 2.0000 | ORAL_TABLET | Freq: Every day | ORAL | Status: DC

## 2015-02-01 MED ORDER — POLYETHYLENE GLYCOL 3350 17 G PO PACK: 17.0000 g | PACK | Freq: Two times a day (BID) | ORAL | Status: DC

## 2015-02-01 NOTE — Discharge Summary (Signed)
Physician Discharge Summary  NERINE PULSE ZOX:[id number] DOB: [birth year] DOA: 01/30/2015  PCP: Sanda Linger, MD  Admit date: 01/30/2015 Discharge date: 02/01/2015  Time spent: 60 minutes  Recommendations for Outpatient Follow-up:  1. Follow-up with Sanda Linger, MD as scheduled. On follow-up urine cultures which are pending at time of discharge related to be followed up upon. Patient received a total of 3 doses of IV Rocephin during the hospitalization. No further antibiotics are needed on discharge. Follow-up patient's abdominal pain is worsened in the recultures are negative make consider CT scan of abdomen and pelvis. Will hold off for now as patient has improved significantly.  Discharge Diagnoses:  Principal Problem:   Hyponatremia Active Problems:   Insomnia   Constipation   ARF (acute renal failure)   Suprapubic pressure   UTI (lower urinary tract infection)   Abdominal pain   Discharge Condition: Stable and improved  Diet recommendation: Regular  Filed Weights   01/31/15 1059  Weight: 50.803 kg (112 lb)    History of present illness:  Per Dr Raliegh Ip is a 81 y.o. female with a Past Medical History of constipation predominant irritable bowel syndrome, insomnia who presented to the ED with 5 days of suprapubic discomfort and weakness and nausea. Per patient, approximately a week back, patient started developing suprapubic discomfort and frequency of urination. This was associated with generalized weakness. She subsequently went to a local urgent care, where she was diagnosed with urinary tract infection and given a three-day course of Bactrim and Pyridium which she completed 1-2 days back. She continued to experience suprapubic discomfort and weakness, hence she presented to the emergency room for further evaluation. Patient denied any fever, she did endorse frequency and nocturia more than usual. However denied dysuria (has suprapubic discomfort irrespective of  urination). She also denied hematuria or foul-smelling urine. She did endorse nausea without vomiting. There was no history of diarrhea or abdominal. She did claim to have a poor appetite for the past few days.  Hospital Course:  #1 hyponatremia Secondary to hypovolemic hyponatremia secondary to poor oral intake. Patient was hydrated with IV fluids with resolution of her hyponatremia by day of discharge. Patient was discharged in stable and improved condition. Patient is to follow-up with PCP as outpatient.  #2 acute renal failure Secondary to prerenal azotemia in the setting of decreased oral intake. Patient was also noted to be on Bactrim prior to admission. Patient was hydrated with IV fluids with resolution of her acute renal failure. Outpatient follow-up.   #3 constipation/IBS Patient was noted to be constipated on admission. Patient states this is chronic in nature. Patient was put on about regimen of naloxone Senokot. Soapsuds enema was given and patient had a large bowel movement with clinical improvement. Outpatient follow-up.  #4/? UTI versus interstitial cystitis Patient presented with complaints of suprapubic abdominal pain. Patient on initial presentation denied any dysuria, on hospital day #1 stomach complain of some dysuria. Patient was recently treated for a UTI with Bactrim.  Urinalysis on admission was nitrite positive. Leukocytes -3-6 WBCs. Patient received a dose of IV Rocephin in the emergency room. Urine cultures are pending on day of discharge. Patient has received a total of 3 doses of IV Rocephin from the hospitalization and will not be discharged on any oral antibody occasions. Patient will follow-up with PCP on Tuesday, 02/04/2015 at which point in time urine cultures will need to be followed up upon. Patient will be discharged in stable and improved condition.  Procedures:  Acute abdominal series 01/30/2015  Consultations:  None  Discharge Exam: Filed Vitals:    02/01/15 0506  BP: 160/65  Pulse: 61  Temp: 97.8 F (36.6 C)  Resp: 18    General: NAD Cardiovascular: RRR Respiratory: CTAB  Discharge Instructions   Discharge Instructions    Diet general    Complete by:  As directed      Discharge instructions    Complete by:  As directed   Follow up with Sanda Linger, MD as scheduled.     Increase activity slowly    Complete by:  As directed           Current Discharge Medication List    START taking these medications   Details  polyethylene glycol (MIRALAX / GLYCOLAX) packet Take 17 g by mouth 2 (two) times daily. Hold if develops diarrhea Qty: 30 each, Refills: 0    senna (SENOKOT) 8.6 MG TABS tablet Take 2 tablets (17.2 mg total) by mouth at bedtime. Qty: 120 each, Refills: 0      CONTINUE these medications which have NOT CHANGED   Details  FLUoxetine (PROZAC) 20 MG capsule Take 1 capsule (20 mg total) by mouth daily. Qty: 90 capsule, Refills: 3   Associated Diagnoses: Depression with somatization    Linaclotide (LINZESS) 145 MCG CAPS capsule Take 1 capsule (145 mcg total) by mouth daily. Qty: 30 capsule, Refills: 11   Associated Diagnoses: Constipation, unspecified constipation type    Omega-3 Fatty Acids (FISH OIL) 1000 MG CAPS Take 1 each by mouth daily.     zolpidem (AMBIEN) 10 MG tablet Take 0.5 tablets (5 mg total) by mouth at bedtime as needed for sleep. Qty: 30 tablet, Refills: 3   Associated Diagnoses: Insomnia      STOP taking these medications     sulfamethoxazole-trimethoprim (BACTRIM DS,SEPTRA DS) 800-160 MG per tablet        No Known Allergies Follow-up Information    Follow up with Sanda Linger, MD On 02/04/2015.   Specialty:  Internal Medicine   Contact information:   [address] [place] [address] [phone]        The results of significant diagnostics from this hospitalization (including imaging, microbiology, ancillary and laboratory) are listed below for  reference.    Significant Diagnostic Studies: Dg Abd Acute W/chest  01/30/2015   CLINICAL DATA:  81 year old female with abdominal pain for the past 2 days.  EXAM: DG ABDOMEN ACUTE W/ 1V CHEST  COMPARISON:  Chest x-ray 04/01/2014.  FINDINGS: Lung volumes are normal. No consolidative airspace disease. No pleural effusions. No pneumothorax. No evidence of pulmonary edema. No suspicious appearing pulmonary nodules or masses. Heart size is normal. Upper mediastinal contours are within normal limits. Moderate hiatal hernia again noted.  Gas and stool are seen scattered throughout the colon extending to the level of the distal rectum. No pathologic distension of small bowel is noted. No gross evidence of pneumoperitoneum. Numerous pelvic phleboliths are noted.  IMPRESSION: 1.   1.  Nonobstructive bowel gas pattern. 2. No pneumoperitoneum. 3. No radiographic evidence of acute cardiopulmonary disease. 4. Atherosclerosis. 5. Moderate size hiatal hernia.   Electronically Signed   By: Trudie Reed M.D.   On: 01/30/2015 20:21    Microbiology: Recent Results (from the past 240 hour(s))  Urine culture     Status: None (Preliminary result)   Collection Time: 01/30/15  1:54 PM  Result Value Ref Range Status   Specimen Description URINE, RANDOM  Final   Special Requests NONE  Final   Culture TOO YOUNG TO READ  Final   Report Status PENDING  Incomplete     Labs: Basic Metabolic Panel:  Recent Labs Lab 01/30/15 1300 01/31/15 0412 02/01/15 0404  NA 129* 134* 137  K 4.9 4.6 4.8  CL 98* 108 110  CO2 21* 22 22  GLUCOSE 142* 95 98  BUN 16 13 12   CREATININE 1.20* 0.99 1.04*  CALCIUM 9.2 8.4* 8.9   Liver Function Tests:  Recent Labs Lab 01/30/15 1300  AST 31  ALT 13*  ALKPHOS 78  BILITOT 0.6  PROT 6.8  ALBUMIN 3.6    Recent Labs Lab 01/30/15 1300  LIPASE 28   No results for input(s): AMMONIA in the last 168 hours. CBC:  Recent Labs Lab 01/30/15 1300 01/31/15 0412 02/01/15 0404   WBC 4.8 4.7 3.7*  HGB 12.5 10.8* 11.4*  HCT 37.2 32.5* 34.5*  MCV 91.9 91.0 91.8  PLT 231 178 186   Cardiac Enzymes: No results for input(s): CKTOTAL, CKMB, CKMBINDEX, TROPONINI in the last 168 hours. BNP: BNP (last 3 results) No results for input(s): BNP in the last 8760 hours.  ProBNP (last 3 results) No results for input(s): PROBNP in the last 8760 hours.  CBG: No results for input(s): GLUCAP in the last 168 hours.     SignedRamiro Harvest MD [facility] 02/01/2015, 11:14 AM

## 2015-02-01 NOTE — Care Management Note (Addendum)
Case Management Note  Patient Details  Name: Evelyn [name] MRN: [id number] Date of Birth: [birth year]  Subjective/Objective:                   Weakness/nausea-5 days Action/Plan:  discharge planning Expected Discharge Date:  02/01/15               Expected Discharge Plan:  Home/Self Care  In-House Referral:     Discharge planning Services  CM Consult  Post Acute Care Choice:    Choice offered to:     DME Arranged:    DME Agency:     HH Arranged:    [place] Agency:     Status of Service:  Completed, signed off  Medicare Important Message Given:    Date Medicare IM Given:    Medicare IM give by:    Date Additional Medicare IM Given:    Additional Medicare Important Message give by:     If discussed at [place] of [place] Meetings, dates discussed:    Additional Comments: 02/02/15 Cm faxed order, facesheet, H&P, PT eval note, and [facility] with request they call pt Monday 02/03/15 with appointment.  No other CM needs were communicated. CM notified by MD to please arrange outpt PT/OT.  CM met with pt and explained the [facility] will call on Monday to schedule an appt.  NO other Cm needs were communicated. Dellie Catholic, RN 02/01/2015, 11:50 AM

## 2015-02-03 ENCOUNTER — Telehealth: Payer: Self-pay | Admitting: *Deleted

## 2015-02-03 NOTE — Telephone Encounter (Signed)
Transition Care Management Follow-up Telephone Call   Date discharged? 02/01/15   How have you been since you were released from the hospital? Pt states she is doing ok   Do you understand why you were in the hospital? YES   Do you understand the discharge instructions? YES   Where were you discharged to? Home   Items Reviewed:  Medications reviewed: YES  Allergies reviewed: YES  Dietary changes reviewed: NO  Referrals reviewed: no referral needed   Functional Questionnaire:   Activities of Daily Living (ADLs):   She states she are independent in the following: ambulation, bathing and hygiene, feeding, continence, grooming, toileting and dressing States she doesn't require assistance     Any transportation issues/concerns?: NO   Any patient concerns? NO   Confirmed importance and date/time of follow-up visits scheduled YES, pt states she already call to make f/u for 02/04/15  Provider Appointment booked with Dr. Yetta Barre  Confirmed with patient if condition begins to worsen call PCP or go to the ER.  Patient was given the office number and encouraged to call back with question or concerns.  : YES

## 2015-02-04 ENCOUNTER — Other Ambulatory Visit (INDEPENDENT_AMBULATORY_CARE_PROVIDER_SITE_OTHER): Payer: Medicare Other

## 2015-02-04 ENCOUNTER — Ambulatory Visit (INDEPENDENT_AMBULATORY_CARE_PROVIDER_SITE_OTHER): Payer: Medicare Other | Admitting: Internal Medicine

## 2015-02-04 ENCOUNTER — Encounter: Payer: Self-pay | Admitting: Internal Medicine

## 2015-02-04 VITALS — BP 118/62 | HR 70 | Temp 97.7°F | Resp 16 | Ht 64.0 in | Wt 115.0 lb

## 2015-02-04 DIAGNOSIS — K5900 Constipation, unspecified: Secondary | ICD-10-CM | POA: Diagnosis not present

## 2015-02-04 DIAGNOSIS — N390 Urinary tract infection, site not specified: Secondary | ICD-10-CM | POA: Diagnosis not present

## 2015-02-04 DIAGNOSIS — E871 Hypo-osmolality and hyponatremia: Secondary | ICD-10-CM | POA: Diagnosis not present

## 2015-02-04 DIAGNOSIS — N179 Acute kidney failure, unspecified: Secondary | ICD-10-CM

## 2015-02-04 DIAGNOSIS — K589 Irritable bowel syndrome without diarrhea: Secondary | ICD-10-CM

## 2015-02-04 LAB — CBC WITH DIFFERENTIAL/PLATELET
BASOS ABS: 0 10*3/uL (ref 0.0–0.1)
Basophils Relative: 0.6 % (ref 0.0–3.0)
Eosinophils Absolute: 0.1 10*3/uL (ref 0.0–0.7)
Eosinophils Relative: 1.9 % (ref 0.0–5.0)
HEMATOCRIT: 39.4 % (ref 36.0–46.0)
HEMOGLOBIN: 13.1 g/dL (ref 12.0–15.0)
LYMPHS PCT: 19 % (ref 12.0–46.0)
Lymphs Abs: 1.2 10*3/uL (ref 0.7–4.0)
MCHC: 33.3 g/dL (ref 30.0–36.0)
MCV: 92.7 fl (ref 78.0–100.0)
MONOS PCT: 12.2 % — AB (ref 3.0–12.0)
Monocytes Absolute: 0.7 10*3/uL (ref 0.1–1.0)
Neutro Abs: 4.1 10*3/uL (ref 1.4–7.7)
Neutrophils Relative %: 66.3 % (ref 43.0–77.0)
Platelets: 243 10*3/uL (ref 150.0–400.0)
RBC: 4.25 Mil/uL (ref 3.87–5.11)
RDW: 13.7 % (ref 11.5–15.5)
WBC: 6.1 10*3/uL (ref 4.0–10.5)

## 2015-02-04 LAB — BASIC METABOLIC PANEL
BUN: 21 mg/dL (ref 6–23)
CALCIUM: 9.6 mg/dL (ref 8.4–10.5)
CO2: 26 mEq/L (ref 19–32)
Chloride: 101 mEq/L (ref 96–112)
Creatinine, Ser: 0.94 mg/dL (ref 0.40–1.20)
GFR: 59.7 mL/min — AB (ref >60.00)
Glucose, Bld: 78 mg/dL (ref 70–99)
Potassium: 5.3 mEq/L — ABNORMAL HIGH (ref 3.5–5.1)
SODIUM: 133 meq/L — AB (ref 135–145)

## 2015-02-04 LAB — URINALYSIS, ROUTINE W REFLEX MICROSCOPIC
Bilirubin Urine: NEGATIVE
KETONES UR: NEGATIVE
LEUKOCYTES UA: NEGATIVE
NITRITE: NEGATIVE
SPECIFIC GRAVITY, URINE: 1.01 (ref 1.000–1.030)
Total Protein, Urine: NEGATIVE
URINE GLUCOSE: NEGATIVE
Urobilinogen, UA: 0.2 (ref 0.0–1.0)
pH: 6 (ref 5.0–8.0)

## 2015-02-04 LAB — CORTISOL: Cortisol, Plasma: 15.8 ug/dL

## 2015-02-04 MED ORDER — RIFAXIMIN 200 MG PO TABS: 200.0000 mg | ORAL_TABLET | Freq: Three times a day (TID) | ORAL | Status: DC

## 2015-02-04 MED ORDER — LINACLOTIDE 290 MCG PO CAPS: 290.0000 ug | ORAL_CAPSULE | Freq: Every day | ORAL | Status: DC

## 2015-02-04 NOTE — Patient Instructions (Signed)
Irritable Bowel Syndrome Irritable bowel syndrome (IBS) is caused by a disturbance of normal bowel function and is a common digestive disorder. You may also hear this condition called spastic colon, mucous colitis, and irritable colon. There is no cure for IBS. However, symptoms often gradually improve or disappear with a good diet, stress management, and medicine. This condition usually appears in late adolescence or early adulthood. Women develop it twice as often as men. CAUSES  After food has been digested and absorbed in the small intestine, waste material is moved into the large intestine, or colon. In the colon, water and salts are absorbed from the undigested products coming from the small intestine. The remaining residue, or fecal material, is held for elimination. Under normal circumstances, gentle, rhythmic contractions of the bowel walls push the fecal material along the colon toward the rectum. In IBS, however, these contractions are irregular and poorly coordinated. The fecal material is either retained too long, resulting in constipation, or expelled too soon, producing diarrhea. SIGNS AND SYMPTOMS  The most common symptom of IBS is abdominal pain. It is often in the lower left side of the abdomen, but it may occur anywhere in the abdomen. The pain comes from spasms of the bowel muscles happening too much and from the buildup of gas and fecal material in the colon. This pain:  Can range from sharp abdominal cramps to a dull, continuous ache.  Often worsens soon after eating.  Is often relieved by having a bowel movement or passing gas. Abdominal pain is usually accompanied by constipation, but it may also produce diarrhea. The diarrhea often occurs right after a meal or upon waking up in the morning. The stools are often soft, watery, and flecked with mucus. Other symptoms of IBS include:  Bloating.  Loss of appetite.  Heartburn.  Backache.  Dull pain in the arms or  shoulders.  Nausea.  Burping.  Vomiting.  Gas. IBS may also cause symptoms that are unrelated to the digestive system, such as:  Fatigue.  Headaches.  Anxiety.  Shortness of breath.  Trouble concentrating.  Dizziness. These symptoms tend to come and go. DIAGNOSIS  The symptoms of IBS may seem like symptoms of other, more serious digestive disorders. Your health care provider may want to perform tests to exclude these disorders.  TREATMENT Many medicines are available to help correct bowel function or relieve bowel spasms and abdominal pain. Among the medicines available are:  Laxatives for severe constipation and to help restore normal bowel habits.  Specific antidiarrheal medicines to treat severe or lasting diarrhea.  Antispasmodic agents to relieve intestinal cramps. Your health care provider may also decide to treat you with a mild tranquilizer or sedative during unusually stressful periods in your life. Your health care provider may also prescribe antidepressant medicine. The use of this medicine has been shown to reduce pain and other symptoms of IBS. Remember that if any medicine is prescribed for you, you should take it exactly as directed. Make sure your health care provider knows how well it worked for you. HOME CARE INSTRUCTIONS   Take all medicines as directed by your health care provider.  Avoid foods that are high in fat or oils, such as heavy cream, butter, frankfurters, sausage, and other fatty meats.  Avoid foods that make you go to the bathroom, such as fruit, fruit juice, and dairy products.  Cut out carbonated drinks, chewing gum, and "gassy" foods such as beans and cabbage. This may help relieve bloating and burping.    Eat foods with bran, and drink plenty of liquids with the bran foods. This helps relieve constipation.  Keep track of what foods seem to bring on your symptoms.  Avoid emotionally charged situations or circumstances that produce  anxiety.  Start or continue exercising.  Get plenty of rest and sleep. Document Released: 06/07/2005 Document Revised: 06/12/2013 Document Reviewed: 01/26/2008 ExitCare Patient Information 2015 [employer]. This information is not intended to replace advice given to you by your health care provider. Make sure you discuss any questions you have with your health care provider.  

## 2015-02-04 NOTE — Progress Notes (Signed)
Subjective:  Patient ID: Evelyn [name], female    DOB: [birth year]  Age: 81 y.o. MRN: [id number]  CC: Abdominal Pain and Urinary Tract Infection   HPI CHAMARI CUTBIRTH presents for follow-up after recent admission for urinary tract infection, dehydration, acute kidney injury and hyponatremia. She reports that she is feeling better. She has gained 5 pounds since she was discharged from the hospital. She has mild nausea but no vomiting. She complains of persistent dysuria. She had been seen at an urgent care center and was told she  had a Escherichia coli UTI. In the hospital her urine culture was positive only for multiple organisms. After a few days of taking Bactrim for the urinary tract infection she felt poorly and was subsequently admitted to the hospital for dehydration. She also complains of chronic persistent abdominal pain with intermittent constipation and bloating. She tried low-dose Linzess but says it didn't quite treat her symptoms. Her son is with her today and he now tells me that 8 months ago she was visiting him in [place] and his father-in-law who is a gastroenterologist gave her Xifaxan and that did help with her pain quite a bit. She has a history of having alternating diarrhea and constipation but more recently has had only the complaint of constipation.  Outpatient Prescriptions Prior to Visit  Medication Sig Dispense Refill  . FLUoxetine (PROZAC) 20 MG capsule Take 1 capsule (20 mg total) by mouth daily. 90 capsule 3  . Omega-3 Fatty Acids (FISH OIL) 1000 MG CAPS Take 1 each by mouth daily.     . polyethylene glycol (MIRALAX / GLYCOLAX) packet Take 17 g by mouth 2 (two) times daily. Hold if develops diarrhea 30 each 0  . zolpidem (AMBIEN) 10 MG tablet Take 0.5 tablets (5 mg total) by mouth at bedtime as needed for sleep. 30 tablet 3  . senna (SENOKOT) 8.6 MG TABS tablet Take 2 tablets (17.2 mg total) by mouth at bedtime. 120 each 0  . Linaclotide (LINZESS) 145 MCG CAPS capsule  Take 1 capsule (145 mcg total) by mouth daily. (Patient not taking: Reported on 02/04/2015) 30 capsule 11   No facility-administered medications prior to visit.    ROS Review of Systems  Constitutional: Positive for fatigue. Negative for fever, chills, diaphoresis, activity change, appetite change and unexpected weight change.  HENT: Negative.   Eyes: Negative.   Respiratory: Negative.  Negative for cough, choking, chest tightness, shortness of breath and stridor.   Cardiovascular: Negative.  Negative for chest pain, palpitations and leg swelling.  Gastrointestinal: Positive for abdominal pain and constipation. Negative for nausea, vomiting, diarrhea, blood in stool, abdominal distention, anal bleeding and rectal pain.  Endocrine: Negative.   Genitourinary: Positive for dysuria. Negative for urgency, frequency, hematuria, flank pain, difficulty urinating and pelvic pain.  Musculoskeletal: Negative.   Skin: Negative.   Allergic/Immunologic: Negative.   Neurological: Negative.   Hematological: Negative.  Negative for adenopathy. Does not bruise/bleed easily.  Psychiatric/Behavioral: Negative.     Objective:  BP 118/62 mmHg  Pulse 70  Temp(Src) 97.7 F (36.5 C) (Oral)  Resp 16  Ht 5\' 4"  (1.626 m)  Wt 115 lb (52.164 kg)  BMI 19.73 kg/m2  SpO2 97%  BP Readings from Last 3 Encounters:  02/04/15 118/62  02/01/15 160/65  12/05/14 120/88    Wt Readings from Last 3 Encounters:  02/04/15 115 lb (52.164 kg)  01/31/15 112 lb (50.803 kg)  12/05/14 115 lb (52.164 kg)    Physical Exam  Constitutional: She is oriented to person, place, and time. She appears well-developed and well-nourished.  Non-toxic appearance. She does not have a sickly appearance. She does not appear ill. No distress.  HENT:  Mouth/Throat: Oropharynx is clear and moist. No oropharyngeal exudate.  Eyes: Conjunctivae are normal. Right eye exhibits no discharge. Left eye exhibits no discharge. No scleral icterus.    Neck: Normal range of motion. Neck supple. No JVD present. No tracheal deviation present. No thyromegaly present.  Cardiovascular: Normal rate, regular rhythm, normal heart sounds and intact distal pulses.  Exam reveals no gallop and no friction rub.   No murmur heard. Pulmonary/Chest: Effort normal and breath sounds normal. No stridor. No respiratory distress. She has no wheezes. She has no rales. She exhibits no tenderness.  Abdominal: Soft. Normal appearance and bowel sounds are normal. She exhibits no distension and no mass. There is no hepatosplenomegaly, splenomegaly or hepatomegaly. There is no tenderness. There is no rebound, no guarding and no CVA tenderness.  Musculoskeletal: Normal range of motion. She exhibits no edema or tenderness.  Lymphadenopathy:    She has no cervical adenopathy.  Neurological: She is oriented to person, place, and time.  Skin: Skin is warm and dry. No rash noted. She is not diaphoretic. No erythema. No pallor.  Psychiatric: She has a normal mood and affect. Her behavior is normal. Judgment and thought content normal.    Lab Results  Component Value Date   WBC 3.7* 02/01/2015   HGB 11.4* 02/01/2015   HCT 34.5* 02/01/2015   PLT 186 02/01/2015   GLUCOSE 98 02/01/2015   CHOL 206* 12/05/2014   TRIG 73.0 12/05/2014   HDL 63.80 12/05/2014   LDLDIRECT 146.5 05/03/2013   LDLCALC 128* 12/05/2014   ALT 13* 01/30/2015   AST 31 01/30/2015   NA 137 02/01/2015   K 4.8 02/01/2015   CL 110 02/01/2015   CREATININE 1.04* 02/01/2015   BUN 12 02/01/2015   CO2 22 02/01/2015   TSH 1.35 12/05/2014   INR 1.1* 08/13/2010   HGBA1C 5.7 12/05/2014    Dg Abd Acute W/chest  01/30/2015   CLINICAL DATA:  81 year old female with abdominal pain for the past 2 days.  EXAM: DG ABDOMEN ACUTE W/ 1V CHEST  COMPARISON:  Chest x-ray 04/01/2014.  FINDINGS: Lung volumes are normal. No consolidative airspace disease. No pleural effusions. No pneumothorax. No evidence of pulmonary  edema. No suspicious appearing pulmonary nodules or masses. Heart size is normal. Upper mediastinal contours are within normal limits. Moderate hiatal hernia again noted.  Gas and stool are seen scattered throughout the colon extending to the level of the distal rectum. No pathologic distension of small bowel is noted. No gross evidence of pneumoperitoneum. Numerous pelvic phleboliths are noted.  IMPRESSION: 1.   1.  Nonobstructive bowel gas pattern. 2. No pneumoperitoneum. 3. No radiographic evidence of acute cardiopulmonary disease. 4. Atherosclerosis. 5. Moderate size hiatal hernia.   Electronically Signed   By: Trudie Reed M.D.   On: 01/30/2015 20:21    Assessment & Plan:   Siya was seen today for abdominal pain and urinary tract infection.  Diagnoses and all orders for this visit:  UTI (lower urinary tract infection)- I will recheck her UA and her urine culture, will treat if indicated -     Urinalysis, Routine w reflex microscopic (not at [facility]); Future -     CULTURE, URINE COMPREHENSIVE; Future  Acute renal failure, unspecified acute renal failure type- improvement noted, will recheck her renal function  today. -     Basic metabolic panel; Future -     CBC with Differential/Platelet; Future  Hyponatremia- this appears to have improved. Today I will recheck her sodium level and will also screen her for adrenal insufficiency with a cortisol level. -     Basic metabolic panel; Future -     Cortisol; Future  Constipation, unspecified constipation type- will change Linzess to the high dose. -     Linaclotide (LINZESS) 290 MCG CAPS capsule; Take 1 capsule (290 mcg total) by mouth daily.  IBS (irritable bowel syndrome)- she describes being overstimulated by medications such as MiraLAX and Senokot. Will try high-dose Linzess to see if this helps with her pain and constipation. If that does not help then she will try another course of Xifaxan. -     Linaclotide (LINZESS) 290 MCG CAPS  capsule; Take 1 capsule (290 mcg total) by mouth daily. -     rifaximin (XIFAXAN) 200 MG tablet; Take 1 tablet (200 mg total) by mouth 3 (three) times daily.   I have discontinued Ms. Labate's Linaclotide and senna. I am also having her start on Linaclotide and rifaximin. Additionally, I am having her maintain her Fish Oil, FLUoxetine, zolpidem, and polyethylene glycol.  Meds ordered this encounter  Medications  . Linaclotide (LINZESS) 290 MCG CAPS capsule    Sig: Take 1 capsule (290 mcg total) by mouth daily.    Dispense:  90 capsule    Refill:  3  . rifaximin (XIFAXAN) 200 MG tablet    Sig: Take 1 tablet (200 mg total) by mouth 3 (three) times daily.    Dispense:  42 tablet    Refill:  0     Follow-up: Return in about 2 months (around 04/06/2015).  Sanda Linger, MD

## 2015-02-04 NOTE — Progress Notes (Signed)
Pre visit review using our clinic review tool, if applicable. No additional management support is needed unless otherwise documented below in the visit note. 

## 2015-02-05 LAB — CULTURE, URINE COMPREHENSIVE
COLONY COUNT: NO GROWTH
Organism ID, Bacteria: NO GROWTH

## 2015-02-06 NOTE — Progress Notes (Signed)
Informed.

## 2015-02-17 ENCOUNTER — Telehealth: Payer: Self-pay

## 2015-02-17 NOTE — Telephone Encounter (Signed)
Pa approved through 02/17/2016. Pharmacy notified

## 2015-02-17 NOTE — Telephone Encounter (Signed)
Pa initiated via covermymeds KEY: [facility]

## 2015-03-07 ENCOUNTER — Telehealth: Payer: Self-pay

## 2015-03-07 NOTE — Telephone Encounter (Signed)
Incoming fax for Pa on Xifaxan. Is there an alternative for this script or should I pursue?

## 2015-03-09 NOTE — Telephone Encounter (Signed)
Please pursue

## 2015-03-11 NOTE — Telephone Encounter (Signed)
Msg left on triage calling to get PA for Xifaxan 200 mg. Please return call to complete PA over phone. Ref # [id number]...Raechel Chute

## 2015-03-11 NOTE — Telephone Encounter (Signed)
PA was done yesterday status is pending

## 2015-03-12 NOTE — Telephone Encounter (Signed)
Pa approved. Pharmacy notified 

## 2015-03-12 NOTE — Telephone Encounter (Signed)
Received faxes from [facility] company. Awaiting another response per medication dosage

## 2015-04-08 DIAGNOSIS — H52203 Unspecified astigmatism, bilateral: Secondary | ICD-10-CM | POA: Diagnosis not present

## 2015-04-08 DIAGNOSIS — H2511 Age-related nuclear cataract, right eye: Secondary | ICD-10-CM | POA: Diagnosis not present

## 2015-04-08 DIAGNOSIS — H31001 Unspecified chorioretinal scars, right eye: Secondary | ICD-10-CM | POA: Diagnosis not present

## 2015-04-08 DIAGNOSIS — Z961 Presence of intraocular lens: Secondary | ICD-10-CM | POA: Diagnosis not present

## 2015-04-22 DIAGNOSIS — Z23 Encounter for immunization: Secondary | ICD-10-CM | POA: Diagnosis not present

## 2015-06-02 ENCOUNTER — Encounter: Payer: Self-pay | Admitting: Internal Medicine

## 2015-06-02 ENCOUNTER — Other Ambulatory Visit (INDEPENDENT_AMBULATORY_CARE_PROVIDER_SITE_OTHER): Payer: Medicare Other

## 2015-06-02 ENCOUNTER — Ambulatory Visit (INDEPENDENT_AMBULATORY_CARE_PROVIDER_SITE_OTHER): Payer: Medicare Other | Admitting: Internal Medicine

## 2015-06-02 VITALS — BP 140/78 | HR 64 | Temp 97.8°F | Resp 16 | Ht 64.0 in | Wt 114.0 lb

## 2015-06-02 DIAGNOSIS — N390 Urinary tract infection, site not specified: Secondary | ICD-10-CM

## 2015-06-02 LAB — URINALYSIS, ROUTINE W REFLEX MICROSCOPIC
BILIRUBIN URINE: NEGATIVE
KETONES UR: NEGATIVE
NITRITE: NEGATIVE
PH: 6.5 (ref 5.0–8.0)
Specific Gravity, Urine: 1.015 (ref 1.000–1.030)
TOTAL PROTEIN, URINE-UPE24: NEGATIVE
UROBILINOGEN UA: 0.2 (ref 0.0–1.0)
Urine Glucose: NEGATIVE

## 2015-06-02 MED ORDER — CIPROFLOXACIN HCL 250 MG PO TABS: 250.0000 mg | ORAL_TABLET | Freq: Two times a day (BID) | ORAL | Status: AC

## 2015-06-02 NOTE — Patient Instructions (Signed)

## 2015-06-02 NOTE — Progress Notes (Signed)
Pre visit review using our clinic review tool, if applicable. No additional management support is needed unless otherwise documented below in the visit note. 

## 2015-06-02 NOTE — Progress Notes (Signed)
Subjective:  Patient ID: Evelyn [name], female    DOB: September 21, 1924  Age: 81 y.o. MRN: [id number]  CC: Dysuria   HPI Evelyn [name] presents for a 5 day hx of dysuria, frequency, low back pain, and nocturia.  Outpatient Prescriptions Prior to Visit  Medication Sig Dispense Refill  . FLUoxetine (PROZAC) 20 MG capsule Take 1 capsule (20 mg total) by mouth daily. 90 capsule 3  . Linaclotide (LINZESS) 290 MCG CAPS capsule Take 1 capsule (290 mcg total) by mouth daily. 90 capsule 3  . polyethylene glycol (MIRALAX / GLYCOLAX) packet Take 17 g by mouth 2 (two) times daily. Hold if develops diarrhea 30 each 0  . zolpidem (AMBIEN) 10 MG tablet Take 0.5 tablets (5 mg total) by mouth at bedtime as needed for sleep. 30 tablet 3  . Omega-3 Fatty Acids (FISH OIL) 1000 MG CAPS Take 1 each by mouth daily.     . rifaximin (XIFAXAN) 200 MG tablet Take 1 tablet (200 mg total) by mouth 3 (three) times daily. 42 tablet 0   No facility-administered medications prior to visit.    ROS Review of Systems  Constitutional: Negative.  Negative for fever, chills, diaphoresis, appetite change and fatigue.  HENT: Negative.   Eyes: Negative.   Respiratory: Negative.  Negative for cough, choking, shortness of breath and stridor.   Cardiovascular: Negative.  Negative for chest pain, palpitations and leg swelling.  Gastrointestinal: Negative.  Negative for abdominal pain.  Endocrine: Negative.   Genitourinary: Positive for dysuria and frequency. Negative for urgency, hematuria, flank pain, difficulty urinating and pelvic pain.  Musculoskeletal: Positive for back pain. Negative for myalgias.  Skin: Negative.  Negative for color change and rash.  Allergic/Immunologic: Negative.   Neurological: Negative.  Negative for dizziness, tremors, syncope, light-headedness and headaches.  Hematological: Negative.  Negative for adenopathy. Does not bruise/bleed easily.  Psychiatric/Behavioral: Negative.     Objective:  BP  140/78 mmHg  Pulse 64  Temp(Src) 97.8 F (36.6 C) (Oral)  Ht  (1.626 m)  Wt 114 lb (51.71 kg)  BMI 19.56 kg/m2  SpO2 97%  BP Readings from Last 3 Encounters:  06/02/15 140/78  02/04/15 118/62  02/01/15 160/65    Wt Readings from Last 3 Encounters:  06/02/15 114 lb (51.71 kg)  02/04/15 115 lb (52.164 kg)  01/31/15 112 lb (50.803 kg)    Physical Exam  Constitutional: She is oriented to person, place, and time.  Non-toxic appearance. She does not have a sickly appearance. She does not appear ill. No distress.  HENT:  Mouth/Throat: Oropharynx is clear and moist. No oropharyngeal exudate.  Eyes: Conjunctivae are normal. Right eye exhibits no discharge. Left eye exhibits no discharge. No scleral icterus.  Neck: Normal range of motion. Neck supple. No JVD present. No tracheal deviation present. No thyromegaly present.  Cardiovascular: Normal rate, normal heart sounds and intact distal pulses.  Exam reveals no gallop and no friction rub.   No murmur heard. Pulmonary/Chest: Effort normal and breath sounds normal. No stridor. No respiratory distress. She has no wheezes. She has no rales. She exhibits no tenderness.  Abdominal: Soft. Bowel sounds are normal. She exhibits no distension and no mass. There is no hepatosplenomegaly. There is no tenderness. There is no rebound, no guarding and no CVA tenderness.  Musculoskeletal: Normal range of motion. She exhibits no edema or tenderness.  Lymphadenopathy:    She has no cervical adenopathy.  Neurological: She is oriented to person, place, and time.  Skin:  Skin is warm and dry. No rash noted. She is not diaphoretic. No erythema. No pallor.    Lab Results  Component Value Date   WBC 6.1 02/04/2015   HGB 13.1 02/04/2015   HCT 39.4 02/04/2015   PLT 243.0 02/04/2015   GLUCOSE 78 02/04/2015   CHOL 206* 12/05/2014   TRIG 73.0 12/05/2014   HDL 63.80 12/05/2014   LDLDIRECT 146.5 05/03/2013   LDLCALC 128* 12/05/2014   ALT 13* 01/30/2015    AST 31 01/30/2015   NA 133* 02/04/2015   K 5.3* 02/04/2015   CL 101 02/04/2015   CREATININE 0.94 02/04/2015   BUN 21 02/04/2015   CO2 26 02/04/2015   TSH 1.35 12/05/2014   INR 1.1* 08/13/2010   HGBA1C 5.7 12/05/2014    Dg Abd Acute W/chest  01/30/2015  CLINICAL DATA:  81 year old female with abdominal pain for the past 2 days. EXAM: DG ABDOMEN ACUTE W/ 1V CHEST COMPARISON:  Chest x-ray 04/01/2014. FINDINGS: Lung volumes are normal. No consolidative airspace disease. No pleural effusions. No pneumothorax. No evidence of pulmonary edema. No suspicious appearing pulmonary nodules or masses. Heart size is normal. Upper mediastinal contours are within normal limits. Moderate hiatal hernia again noted. Gas and stool are seen scattered throughout the colon extending to the level of the distal rectum. No pathologic distension of small bowel is noted. No gross evidence of pneumoperitoneum. Numerous pelvic phleboliths are noted. IMPRESSION: 1.   1.  Nonobstructive bowel gas pattern. 2. No pneumoperitoneum. 3. No radiographic evidence of acute cardiopulmonary disease. 4. Atherosclerosis. 5. Moderate size hiatal hernia. Electronically Signed   By: Trudie Reedaniel  Entrikin M.D.   On: 01/30/2015 20:21    Assessment & Plan:   Evelyn [name] was seen today for dysuria.  Diagnoses and all orders for this visit:  UTI (lower urinary tract infection)- her urinalysis is positive for RBCs and WBCs, urine culture is pending, will empirically treat with Cipro -     Urinalysis, Routine w reflex microscopic ([facility]); Future -     CULTURE, URINE COMPREHENSIVE; Future -     ciprofloxacin (CIPRO) 250 MG tablet; Take 1 tablet (250 mg total) by mouth 2 (two) times daily.   I have discontinued Ms. Rydberg's Fish Oil and rifaximin. I am also having her start on ciprofloxacin. Additionally, I am having her maintain her FLUoxetine, zolpidem, polyethylene glycol, and Linaclotide.  Meds ordered this encounter  Medications  .  ciprofloxacin (CIPRO) 250 MG tablet    Sig: Take 1 tablet (250 mg total) by mouth 2 (two) times daily.    Dispense:  10 tablet    Refill:  1     Follow-up: Return in about 3 weeks (around 06/23/2015).  Sanda Lingerhomas Loring Liskey, MD

## 2015-06-03 ENCOUNTER — Telehealth: Payer: Self-pay | Admitting: Internal Medicine

## 2015-06-03 NOTE — Telephone Encounter (Signed)
Pt request lab result for her urine test. Please give her a call back  Phone # [phone]

## 2015-06-04 NOTE — Telephone Encounter (Signed)
Pt informed

## 2015-06-05 LAB — CULTURE, URINE COMPREHENSIVE

## 2015-06-26 ENCOUNTER — Ambulatory Visit: Payer: Medicare Other | Admitting: Internal Medicine

## 2015-07-18 DIAGNOSIS — N952 Postmenopausal atrophic vaginitis: Secondary | ICD-10-CM | POA: Diagnosis not present

## 2015-07-18 DIAGNOSIS — R32 Unspecified urinary incontinence: Secondary | ICD-10-CM | POA: Diagnosis not present

## 2015-07-18 DIAGNOSIS — N3281 Overactive bladder: Secondary | ICD-10-CM | POA: Diagnosis not present

## 2015-07-18 DIAGNOSIS — Z0000 Encounter for general adult medical examination without abnormal findings: Secondary | ICD-10-CM | POA: Diagnosis not present

## 2015-08-29 DIAGNOSIS — N3281 Overactive bladder: Secondary | ICD-10-CM | POA: Diagnosis not present

## 2015-10-10 DIAGNOSIS — N3281 Overactive bladder: Secondary | ICD-10-CM | POA: Diagnosis not present

## 2015-12-11 ENCOUNTER — Encounter: Payer: Self-pay | Admitting: Internal Medicine

## 2015-12-11 ENCOUNTER — Other Ambulatory Visit (INDEPENDENT_AMBULATORY_CARE_PROVIDER_SITE_OTHER): Payer: Medicare Other

## 2015-12-11 ENCOUNTER — Ambulatory Visit (INDEPENDENT_AMBULATORY_CARE_PROVIDER_SITE_OTHER): Payer: Medicare Other | Admitting: Internal Medicine

## 2015-12-11 VITALS — BP 130/80 | HR 63 | Temp 97.7°F | Ht 64.0 in | Wt 117.0 lb

## 2015-12-11 DIAGNOSIS — G4700 Insomnia, unspecified: Secondary | ICD-10-CM

## 2015-12-11 DIAGNOSIS — E871 Hypo-osmolality and hyponatremia: Secondary | ICD-10-CM

## 2015-12-11 DIAGNOSIS — E785 Hyperlipidemia, unspecified: Secondary | ICD-10-CM | POA: Diagnosis not present

## 2015-12-11 DIAGNOSIS — K5909 Other constipation: Secondary | ICD-10-CM

## 2015-12-11 DIAGNOSIS — H6123 Impacted cerumen, bilateral: Secondary | ICD-10-CM

## 2015-12-11 LAB — COMPREHENSIVE METABOLIC PANEL
ALBUMIN: 4 g/dL (ref 3.5–5.2)
ALK PHOS: 83 U/L (ref 39–117)
ALT: 13 U/L (ref 0–35)
AST: 24 U/L (ref 0–37)
BUN: 27 mg/dL — ABNORMAL HIGH (ref 6–23)
CO2: 25 mEq/L (ref 19–32)
Calcium: 9.4 mg/dL (ref 8.4–10.5)
Chloride: 113 mEq/L — ABNORMAL HIGH (ref 96–112)
Creatinine, Ser: 0.92 mg/dL (ref 0.40–1.20)
GFR: 61.08 mL/min (ref >60.00)
Glucose, Bld: 81 mg/dL (ref 70–99)
POTASSIUM: 5.3 meq/L — AB (ref 3.5–5.1)
Sodium: 137 mEq/L (ref 135–145)
TOTAL PROTEIN: 7.2 g/dL (ref 6.0–8.3)
Total Bilirubin: 0.4 mg/dL (ref 0.2–1.2)

## 2015-12-11 LAB — CBC WITH DIFFERENTIAL/PLATELET
BASOS PCT: 0.6 % (ref 0.0–3.0)
Basophils Absolute: 0 10*3/uL (ref 0.0–0.1)
EOS PCT: 0.9 % (ref 0.0–5.0)
Eosinophils Absolute: 0.1 10*3/uL (ref 0.0–0.7)
HCT: 39 % (ref 36.0–46.0)
HEMOGLOBIN: 13 g/dL (ref 12.0–15.0)
LYMPHS ABS: 1.2 10*3/uL (ref 0.7–4.0)
LYMPHS PCT: 18.5 % (ref 12.0–46.0)
MCHC: 33.3 g/dL (ref 30.0–36.0)
MCV: 91.1 fl (ref 78.0–100.0)
MONO ABS: 0.9 10*3/uL (ref 0.1–1.0)
Monocytes Relative: 13.9 % — ABNORMAL HIGH (ref 3.0–12.0)
NEUTROS ABS: 4.2 10*3/uL (ref 1.4–7.7)
NEUTROS PCT: 66.1 % (ref 43.0–77.0)
PLATELETS: 200 10*3/uL (ref 150.0–400.0)
RBC: 4.28 Mil/uL (ref 3.87–5.11)
RDW: 13.6 % (ref 11.5–15.5)
WBC: 6.4 10*3/uL (ref 4.0–10.5)

## 2015-12-11 LAB — LIPID PANEL
CHOLESTEROL: 193 mg/dL (ref 0–200)
HDL: 59.3 mg/dL (ref >39.00)
LDL CALC: 115 mg/dL — AB (ref 0–99)
NonHDL: 133.92
Total CHOL/HDL Ratio: 3
Triglycerides: 95 mg/dL (ref 0.0–149.0)
VLDL: 19 mg/dL (ref 0.0–40.0)

## 2015-12-11 LAB — TSH: TSH: 1.82 u[IU]/mL (ref 0.35–4.50)

## 2015-12-11 LAB — MAGNESIUM: MAGNESIUM: 2.1 mg/dL (ref 1.5–2.5)

## 2015-12-11 MED ORDER — ZOLPIDEM TARTRATE 10 MG PO TABS: 5.0000 mg | ORAL_TABLET | Freq: Every evening | ORAL | Status: DC | PRN

## 2015-12-11 NOTE — Patient Instructions (Signed)
Insomnia Insomnia is a sleep disorder that makes it difficult to fall asleep or to stay asleep. Insomnia can cause tiredness (fatigue), low energy, difficulty concentrating, mood swings, and poor performance at work or school.  There are three different ways to classify insomnia:  Difficulty falling asleep.  Difficulty staying asleep.  Waking up too early in the morning. Any type of insomnia can be long-term (chronic) or short-term (acute). Both are common. Short-term insomnia usually lasts for three months or less. Chronic insomnia occurs at least three times a week for longer than three months. CAUSES  Insomnia may be caused by another condition, situation, or substance, such as:  Anxiety.  Certain medicines.  Gastroesophageal reflux disease (GERD) or other gastrointestinal conditions.  Asthma or other breathing conditions.  Restless legs syndrome, sleep apnea, or other sleep disorders.  Chronic pain.  Menopause. This may include hot flashes.  Stroke.  Abuse of alcohol, tobacco, or illegal drugs.  Depression.  Caffeine.   Neurological disorders, such as Alzheimer disease.  An overactive thyroid (hyperthyroidism). The cause of insomnia may not be known. RISK FACTORS Risk factors for insomnia include:  Gender. Women are more commonly affected than men.  Age. Insomnia is more common as you get older.  Stress. This may involve your professional or personal life.  Income. Insomnia is more common in people with lower income.  Lack of exercise.   Irregular work schedule or night shifts.  Traveling between different time zones. SIGNS AND SYMPTOMS If you have insomnia, trouble falling asleep or trouble staying asleep is the main symptom. This may lead to other symptoms, such as:  Feeling fatigued.  Feeling nervous about going to sleep.  Not feeling rested in the morning.  Having trouble concentrating.  Feeling irritable, anxious, or depressed. TREATMENT   Treatment for insomnia depends on the cause. If your insomnia is caused by an underlying condition, treatment will focus on addressing the condition. Treatment may also include:   Medicines to help you sleep.  Counseling or therapy.  Lifestyle adjustments. HOME CARE INSTRUCTIONS   Take medicines only as directed by your health care provider.  Keep regular sleeping and waking hours. Avoid naps.  Keep a sleep diary to help you and your health care provider figure out what could be causing your insomnia. Include:   When you sleep.  When you wake up during the night.  How well you sleep.   How rested you feel the next day.  Any side effects of medicines you are taking.  What you eat and drink.   Make your bedroom a comfortable place where it is easy to fall asleep:  Put up shades or special blackout curtains to block light from outside.  Use a white noise machine to block noise.  Keep the temperature cool.   Exercise regularly as directed by your health care provider. Avoid exercising right before bedtime.  Use relaxation techniques to manage stress. Ask your health care provider to suggest some techniques that may work well for you. These may include:  Breathing exercises.  Routines to release muscle tension.  Visualizing peaceful scenes.  Cut back on alcohol, caffeinated beverages, and cigarettes, especially close to bedtime. These can disrupt your sleep.  Do not overeat or eat spicy foods right before bedtime. This can lead to digestive discomfort that can make it hard for you to sleep.  Limit screen use before bedtime. This includes:  Watching TV.  Using your smartphone, tablet, and computer.  Stick to a routine. This   can help you fall asleep faster. Try to do a quiet activity, brush your teeth, and go to bed at the same time each night.  Get out of bed if you are still awake after 15 minutes of trying to sleep. Keep the lights down, but try reading or  doing a quiet activity. When you feel sleepy, go back to bed.  Make sure that you drive carefully. Avoid driving if you feel very sleepy.  Keep all follow-up appointments as directed by your health care provider. This is important. SEEK MEDICAL CARE IF:   You are tired throughout the day or have trouble in your daily routine due to sleepiness.  You continue to have sleep problems or your sleep problems get worse. SEEK IMMEDIATE MEDICAL CARE IF:   You have serious thoughts about hurting yourself or someone else.   This information is not intended to replace advice given to you by your health care provider. Make sure you discuss any questions you have with your health care provider.   Document Released: 06/04/2000 Document Revised: 02/26/2015 Document Reviewed: 03/08/2014 Elsevier Interactive Patient Education 2016 [employer]  

## 2015-12-11 NOTE — Progress Notes (Signed)
Pre visit review using our clinic review tool, if applicable. No additional management support is needed unless otherwise documented below in the visit note. 

## 2015-12-11 NOTE — Progress Notes (Signed)
Subjective:  Patient ID: Evelyn EdenJoan S Claw, female    DOB: Nov 20, 1924  Age: 82 y.o. MRN: [id number]  CC: Hyperlipidemia and Constipation   HPI Evelyn [name] presents for follow-up on hyperlipidemia and constipation. She complains of mild hearing loss as well as hearing an echo sensation in both ears. She denies ear pain, bleeding, dizziness, vertigo, or discharge from her ears. She tells me her constipation has improved significantly with the daily dose of Linzess. Her abdominal pain has also resolved.  Outpatient Prescriptions Prior to Visit  Medication Sig Dispense Refill  . FLUoxetine (PROZAC) 20 MG capsule Take 1 capsule (20 mg total) by mouth daily. 90 capsule 3  . Linaclotide (LINZESS) 290 MCG CAPS capsule Take 1 capsule (290 mcg total) by mouth daily. 90 capsule 3  . polyethylene glycol (MIRALAX / GLYCOLAX) packet Take 17 g by mouth 2 (two) times daily. Hold if develops diarrhea 30 each 0  . zolpidem (AMBIEN) 10 MG tablet Take 0.5 tablets (5 mg total) by mouth at bedtime as needed for sleep. 30 tablet 3   No facility-administered medications prior to visit.    ROS Review of Systems  Constitutional: Negative.  Negative for diaphoresis, activity change, appetite change, fatigue and unexpected weight change.  HENT: Positive for hearing loss. Negative for ear discharge, ear pain, facial swelling, sinus pressure and trouble swallowing.   Eyes: Negative.   Respiratory: Negative.  Negative for cough, choking, chest tightness, shortness of breath, wheezing and stridor.   Cardiovascular: Negative.  Negative for chest pain, palpitations and leg swelling.  Gastrointestinal: Negative.  Negative for nausea, vomiting, abdominal pain, diarrhea, constipation, abdominal distention and anal bleeding.  Endocrine: Negative.   Genitourinary: Negative.  Negative for difficulty urinating.  Musculoskeletal: Negative.  Negative for myalgias, back pain, joint swelling and arthralgias.  Skin: Negative.   Negative for color change and rash.  Allergic/Immunologic: Negative.   Neurological: Negative.  Negative for dizziness, tremors, syncope, weakness, light-headedness, numbness and headaches.  Hematological: Negative.  Negative for adenopathy. Does not bruise/bleed easily.  Psychiatric/Behavioral: Positive for sleep disturbance. Negative for suicidal ideas, confusion, dysphoric mood, decreased concentration and agitation. The patient is not nervous/anxious.     Objective:  BP 130/80 mmHg  Pulse 63  Temp(Src) 97.7 F (36.5 C) (Oral)  Ht 5\' 4"  (1.626 m)  Wt 117 lb (53.071 kg)  BMI 20.07 kg/m2  SpO2 98%  BP Readings from Last 3 Encounters:  12/11/15 130/80  06/02/15 140/78  02/04/15 118/62    Wt Readings from Last 3 Encounters:  12/11/15 117 lb (53.071 kg)  06/02/15 114 lb (51.71 kg)  02/04/15 115 lb (52.164 kg)    Physical Exam  Constitutional: She is oriented to person, place, and time. No distress.  HENT:  Right Ear: Hearing, tympanic membrane, external ear and ear canal normal.  Left Ear: Hearing, tympanic membrane, external ear and ear canal normal.  Mouth/Throat: Oropharynx is clear and moist. No oropharyngeal exudate.  She has a cerumen impaction in both ears. I put Colace in the ears and then irrigated with water and ear pick. She tolerated this well and afterwards the examination of her external auditory canals and TMs are normal.  Eyes: Conjunctivae are normal. Right eye exhibits no discharge. Left eye exhibits no discharge. No scleral icterus.  Neck: Normal range of motion. Neck supple. No JVD present. No tracheal deviation present. No thyromegaly present.  Cardiovascular: Normal rate, regular rhythm, normal heart sounds and intact distal pulses.  Exam reveals no gallop  and no friction rub.   No murmur heard. Pulmonary/Chest: Effort normal and breath sounds normal. No stridor. No respiratory distress. She has no wheezes. She has no rales. She exhibits no tenderness.    Abdominal: Soft. Bowel sounds are normal. She exhibits no distension and no mass. There is no tenderness. There is no rebound and no guarding.  Musculoskeletal: Normal range of motion. She exhibits no edema or tenderness.  Lymphadenopathy:    She has no cervical adenopathy.  Neurological: She is oriented to person, place, and time.  Skin: Skin is warm and dry. No rash noted. She is not diaphoretic. No erythema. No pallor.  Psychiatric: She has a normal mood and affect. Her behavior is normal. Judgment and thought content normal.  Vitals reviewed.   Lab Results  Component Value Date   WBC 6.4 12/11/2015   HGB 13.0 12/11/2015   HCT 39.0 12/11/2015   PLT 200.0 12/11/2015   GLUCOSE 81 12/11/2015   CHOL 193 12/11/2015   TRIG 95.0 12/11/2015   HDL 59.30 12/11/2015   LDLDIRECT 146.5 05/03/2013   LDLCALC 115* 12/11/2015   ALT 13 12/11/2015   AST 24 12/11/2015   NA 137 12/11/2015   K 5.3* 12/11/2015   CL 113* 12/11/2015   CREATININE 0.92 12/11/2015   BUN 27* 12/11/2015   CO2 25 12/11/2015   TSH 1.82 12/11/2015   INR 1.1* 08/13/2010   HGBA1C 5.7 12/05/2014    Dg Abd Acute W/chest  01/30/2015  CLINICAL DATA:  82 year old female with abdominal pain for the past 2 days. EXAM: DG ABDOMEN ACUTE W/ 1V CHEST COMPARISON:  Chest x-ray 04/01/2014. FINDINGS: Lung volumes are normal. No consolidative airspace disease. No pleural effusions. No pneumothorax. No evidence of pulmonary edema. No suspicious appearing pulmonary nodules or masses. Heart size is normal. Upper mediastinal contours are within normal limits. Moderate hiatal hernia again noted. Gas and stool are seen scattered throughout the colon extending to the level of the distal rectum. No pathologic distension of small bowel is noted. No gross evidence of pneumoperitoneum. Numerous pelvic phleboliths are noted. IMPRESSION: 1.   1.  Nonobstructive bowel gas pattern. 2. No pneumoperitoneum. 3. No radiographic evidence of acute cardiopulmonary  disease. 4. Atherosclerosis. 5. Moderate size hiatal hernia. Electronically Signed   By: Trudie Reed M.D.   On: 01/30/2015 20:21    Assessment & Plan:   Evelyn [name] was seen today for hyperlipidemia and constipation.  Diagnoses and all orders for this visit:  Other constipation- she has had a good response to lens asked, her labs do not show any secondary metabolic causes for constipation -     Comprehensive metabolic panel; Future -     CBC with Differential/Platelet; Future -     Magnesium; Future -     TSH; Future  Hyponatremia- her sodium level is normal now -     Comprehensive metabolic panel; Future  Hyperlipidemia with target LDL less than 130- she is achieved her LDL goal, statin therapy is not indicated. -     Lipid panel; Future -     Comprehensive metabolic panel; Future -     TSH; Future  Insomnia -     zolpidem (AMBIEN) 10 MG tablet; Take 0.5 tablets (5 mg total) by mouth at bedtime as needed for sleep.  Cerumen impaction, bilateral- s/p successful irrigation   I am having Evelyn [name] maintain her FLUoxetine, polyethylene glycol, linaclotide, and zolpidem.  Meds ordered this encounter  Medications  . zolpidem (AMBIEN) 10 MG tablet  Sig: Take 0.5 tablets (5 mg total) by mouth at bedtime as needed for sleep.    Dispense:  30 tablet    Refill:  3     Follow-up: Return if symptoms worsen or fail to improve.  Sanda Lingerhomas Tyresha Fede, MD

## 2015-12-14 DIAGNOSIS — H6120 Impacted cerumen, unspecified ear: Secondary | ICD-10-CM | POA: Insufficient documentation

## 2016-01-27 ENCOUNTER — Encounter (HOSPITAL_COMMUNITY): Payer: Self-pay | Admitting: *Deleted

## 2016-01-27 ENCOUNTER — Ambulatory Visit (HOSPITAL_COMMUNITY)
Admission: EM | Admit: 2016-01-27 | Discharge: 2016-01-27 | Disposition: A | Discharge status: Home / Self Care | Payer: Medicare Other | Attending: Family Medicine | Admitting: Family Medicine

## 2016-01-27 DIAGNOSIS — N390 Urinary tract infection, site not specified: Secondary | ICD-10-CM | POA: Diagnosis not present

## 2016-01-27 MED ORDER — CEPHALEXIN 500 MG PO CAPS: 500.0000 mg | ORAL_CAPSULE | Freq: Four times a day (QID) | ORAL | 0 refills | Status: DC

## 2016-01-27 NOTE — ED Notes (Signed)
Pt   Reports    Fatigue   Nausea   And  Weakness   As  Well

## 2016-01-27 NOTE — Discharge Instructions (Signed)
E all of medicine and see dr Retta Dionesdahlstedt if further problems.

## 2016-01-27 NOTE — ED Triage Notes (Signed)
Pt  Reports  Has   Some  Urinary  Incontinence   And  Reports  Some  Frequency  And  Burning  On  Urination  With  Onset    Of   Symptoms  X  3  Days   Pt reports she  Is  Somewhat  Incontinent  Of  Urine    And  Takes  Azo  For  The  Symptoms

## 2016-01-27 NOTE — ED Notes (Signed)
Pt also  Reports   Vomited  X  2  As  Well

## 2016-01-27 NOTE — ED Provider Notes (Signed)
[facility]    CSN: [id number] Arrival date & time: 01/27/16  1434  First Provider Contact:  First MD Initiated Contact with Patient 01/27/16 1502        History   Chief Complaint Chief Complaint  Patient presents with  . Urinary Tract Infection    HPI Evelyn ZIPP is a 82 y.o. female.   The history is provided by the patient and a relative.  Urinary Tract Infection  Pain quality:  Sharp Pain severity:  Moderate Onset quality:  Sudden Duration:  2 days Chronicity:  Chronic Recent urinary tract infections: yes   Relieved by:  None tried Ineffective treatments:  None tried Urinary symptoms comment:  Pt incont and wears depends. Associated symptoms: nausea   Associated symptoms: no abdominal pain, no fever, no flank pain and no vomiting   Risk factors: recurrent urinary tract infections   Risk factors comment:  Seen by dr Retta Diones.   Past Medical History:  Diagnosis Date  . Chronic cystitis   . Congenital blindness    right eye  . H/O: hematuria   . Hepatitis, unspecified    statin related, resolved quickly  . Hyperlipidemia   . Legally blind in right eye, as defined in [place]   . Syncope and collapse 04/01/2014  . UTI (lower urinary tract infection)    HISTORY OF UTI    Patient Active Problem List   Diagnosis Date Noted  . Cerumen impaction 12/14/2015  . IBS (irritable bowel syndrome)   . Hyponatremia 01/30/2015  . Routine general medical examination at a health care facility 05/03/2013  . Hyperglycemia 03/31/2012  . Constipation 03/31/2012  . Other screening mammogram 03/24/2011  . Depression with somatization 03/06/2010  . Insomnia 03/08/2008  . Hyperlipidemia with target LDL less than 130 02/21/2007    Past Surgical History:  Procedure Laterality Date  . ABDOMINAL HYSTERECTOMY     vaginal  . BLADDER SUSPENSION     A-P  . CATARACT EXTRACTION W/ INTRAOCULAR LENS IMPLANT Left     OB History    No data available       Home  Medications    Prior to Admission medications   Medication Sig Start Date End Date Taking? Authorizing Provider  FLUoxetine (PROZAC) 20 MG capsule Take 1 capsule (20 mg total) by mouth daily. 12/05/14   Etta Grandchild, MD  Linaclotide [facility]) 290 MCG CAPS capsule Take 1 capsule (290 mcg total) by mouth daily. 02/04/15   Etta Grandchild, MD  polyethylene glycol [facility] Ethelene Hal) packet Take 17 g by mouth 2 (two) times daily. Hold if develops diarrhea 02/01/15   Rodolph Bong, MD  zolpidem (AMBIEN) 10 MG tablet Take 0.5 tablets (5 mg total) by mouth at bedtime as needed for sleep. 12/11/15   Etta Grandchild, MD    Family History Family History  Problem Relation Age of Onset  . Cancer Neg Hx     Colon cancer  . Stroke Neg Hx   . Heart disease Neg Hx   . Hyperlipidemia Neg Hx   . Hypertension Neg Hx   . Kidney disease Neg Hx     Social History Social History  Substance Use Topics  . Smoking status: Never Smoker  . Smokeless tobacco: Never Used  . Alcohol use No     Allergies   Review of patient's allergies indicates no known allergies.   Review of Systems Review of Systems  Constitutional: Negative for fever.  Gastrointestinal: Positive for nausea.  Negative for abdominal distention, abdominal pain and vomiting.  Genitourinary: Positive for difficulty urinating and enuresis. Negative for flank pain and frequency.     Physical Exam Triage Vital Signs ED Triage Vitals  Enc Vitals Group     BP 01/27/16 1453 120/70     Pulse Rate 01/27/16 1453 72     Resp 01/27/16 1453 18     Temp 01/27/16 1501 98.2 F (36.8 C)     Temp Source 01/27/16 1501 Temporal     SpO2 01/27/16 1453 98 %     Weight --      Height --      Head Circumference --      Peak Flow --      Pain Score 01/27/16 1455 8     Pain Loc --      Pain Edu? --      Excl. in GC? --    No data found.   Updated Vital Signs BP 120/74 (BP Location: Right Arm)   Pulse 78   Temp 98.2 F (36.8 C) (Temporal)    Resp 16   SpO2 98%   Visual Acuity Right Eye Distance:   Left Eye Distance:   Bilateral Distance:    Right Eye Near:   Left Eye Near:    Bilateral Near:     Physical Exam  Constitutional: She is oriented to person, place, and time. She appears well-developed and well-nourished. No distress.  Pulmonary/Chest: Effort normal and breath sounds normal.  Abdominal: Soft. Bowel sounds are normal. There is no tenderness. There is no guarding.  Neurological: She is alert and oriented to person, place, and time.  Skin: Skin is warm and dry. Capillary refill takes less than 2 seconds.  Psychiatric: She has a normal mood and affect. Her behavior is normal. Judgment and thought content normal.  Nursing note and vitals reviewed.    UC Treatments / Results  Labs (all labs ordered are listed, but only abnormal results are displayed) Labs Reviewed - No data to display  EKG  EKG Interpretation None       Radiology No results found.  Procedures Procedures (including critical care time)  Medications Ordered in UC Medications - No data to display   Initial Impression / Assessment and Plan / UC Course  I have reviewed the triage vital signs and the nursing notes.  Pertinent labs & imaging results that were available during my care of the patient were reviewed by me and considered in my medical decision making (see chart for details).  Clinical Course      Final Clinical Impressions(s) / UC Diagnoses   Final diagnoses:  None    New Prescriptions New Prescriptions   No medications on file     Linna HoffJames D Johann Gascoigne, MD 01/27/16 1529

## 2016-02-13 ENCOUNTER — Other Ambulatory Visit: Payer: Self-pay | Admitting: Internal Medicine

## 2016-02-13 DIAGNOSIS — F32A Depression, unspecified: Secondary | ICD-10-CM

## 2016-02-13 DIAGNOSIS — F450 Somatization disorder: Principal | ICD-10-CM

## 2016-02-13 DIAGNOSIS — F329 Major depressive disorder, single episode, unspecified: Secondary | ICD-10-CM

## 2016-03-04 ENCOUNTER — Encounter (HOSPITAL_COMMUNITY): Payer: Self-pay | Admitting: Emergency Medicine

## 2016-03-04 ENCOUNTER — Ambulatory Visit (INDEPENDENT_AMBULATORY_CARE_PROVIDER_SITE_OTHER): Payer: Medicare Other

## 2016-03-04 ENCOUNTER — Ambulatory Visit (HOSPITAL_COMMUNITY)
Admission: EM | Admit: 2016-03-04 | Discharge: 2016-03-04 | Disposition: A | Discharge status: Home / Self Care | Payer: Medicare Other | Attending: Internal Medicine | Admitting: Internal Medicine

## 2016-03-04 DIAGNOSIS — W19XXXA Unspecified fall, initial encounter: Secondary | ICD-10-CM

## 2016-03-04 DIAGNOSIS — S52501A Unspecified fracture of the lower end of right radius, initial encounter for closed fracture: Secondary | ICD-10-CM

## 2016-03-04 DIAGNOSIS — Y92009 Unspecified place in unspecified non-institutional (private) residence as the place of occurrence of the external cause: Secondary | ICD-10-CM

## 2016-03-04 DIAGNOSIS — Y92099 Unspecified place in other non-institutional residence as the place of occurrence of the external cause: Secondary | ICD-10-CM

## 2016-03-04 DIAGNOSIS — S52591A Other fractures of lower end of right radius, initial encounter for closed fracture: Secondary | ICD-10-CM | POA: Diagnosis not present

## 2016-03-04 NOTE — ED Provider Notes (Signed)
CSN: [id number]     Arrival date & time 03/04/16  1251 History   First MD Initiated Contact with Patient 03/04/16 1431     Chief Complaint  Patient presents with  . Wrist Injury   (Consider location/radiation/quality/duration/timing/severity/associated sxs/prior Treatment) 82 year old female states that she woke up 2 evenings ago to go to the bathroom and she fell on a carpeted floor. She went to the bathroom and went back to bed. Yesterday she felt as though her left wrist was getting better although  there was some ecchymosis and minor swelling. The patient states her daughter saw the discoloration and swelling of the right wrist today and insisted that she seek medical treatment.      Past Medical History:  Diagnosis Date  . Chronic cystitis   . Congenital blindness    right eye  . H/O: hematuria   . Hepatitis, unspecified    statin related, resolved quickly  . Hyperlipidemia   . Legally blind in right eye, as defined in [place]   . Syncope and collapse 04/01/2014  . UTI (lower urinary tract infection)    HISTORY OF UTI   Past Surgical History:  Procedure Laterality Date  . ABDOMINAL HYSTERECTOMY     vaginal  . BLADDER SUSPENSION     A-P  . CATARACT EXTRACTION W/ INTRAOCULAR LENS IMPLANT Left    Family History  Problem Relation Age of Onset  . Cancer Neg Hx     Colon cancer  . Stroke Neg Hx   . Heart disease Neg Hx   . Hyperlipidemia Neg Hx   . Hypertension Neg Hx   . Kidney disease Neg Hx    Social History  Substance Use Topics  . Smoking status: Never Smoker  . Smokeless tobacco: Never Used  . Alcohol use No   OB History    No data available     Review of Systems  Constitutional: Negative for activity change, chills, fatigue and fever.  HENT: Negative.   Respiratory: Negative.   Cardiovascular: Negative.   Musculoskeletal:       As per HPI  Skin: Negative for color change, pallor, rash and wound.  Neurological: Negative.  Negative for dizziness,  syncope, facial asymmetry and numbness.  Psychiatric/Behavioral: Negative.   All other systems reviewed and are negative.   Allergies  Review of patient's allergies indicates no known allergies.  Home Medications   Prior to Admission medications   Medication Sig Start Date End Date Taking? Authorizing Provider  FLUoxetine (PROZAC) 20 MG capsule TAKE 1 CAPSULE BY MOUTH DAILY 02/13/16  Yes Etta Grandchild, MD  Linaclotide [facility]) 290 MCG CAPS capsule Take 1 capsule (290 mcg total) by mouth daily. 02/04/15  Yes Etta Grandchild, MD  polyethylene glycol [facility] / GLYCOLAX) packet Take 17 g by mouth 2 (two) times daily. Hold if develops diarrhea 02/01/15   Rodolph Bong, MD  zolpidem (AMBIEN) 10 MG tablet Take 0.5 tablets (5 mg total) by mouth at bedtime as needed for sleep. 12/11/15   Etta Grandchild, MD   Meds Ordered and Administered this Visit  Medications - No data to display  BP 132/80 (BP Location: Left Arm)   Pulse 67   Temp 98.5 F (36.9 C) (Oral)   Resp 18   SpO2 97%  No data found.   Physical Exam  Constitutional: She is oriented to person, place, and time. She appears well-developed and well-nourished. No distress.  HENT:  Head: Normocephalic and atraumatic.  Neck: Normal range  of motion. Neck supple.  Cardiovascular: Normal rate.   Pulmonary/Chest: Effort normal.  Musculoskeletal: She exhibits edema and tenderness.  Right wrist with mild swelling and ecchymosis extending from the hand to mid forearm. Minimal deformity but may be secondary to appearance of swelling. He is able to extend and flex the wrist partially. Distal neurovascular motor sensory is intact. Radial pulses 2+.  Neurological: She is alert and oriented to person, place, and time.  Skin: Skin is warm and dry.  Psychiatric: She has a normal mood and affect.  Nursing note and vitals reviewed.   Urgent Care Course   Clinical Course  Value Comment By Time  DG Forearm Right (Reviewed) Hayden Rasmussenavid Zalen Sequeira, NP  09/14 1431    Procedures (including critical care time)  Labs Review Labs Reviewed - No data to display  Imaging Review Dg Forearm Right  Result Date: 03/04/2016 CLINICAL DATA:  Status post fall out of bed the morning of 03/02/2016 with a right forearm injury. Pain. Initial encounter. EXAM: RIGHT FOREARM - 2 VIEW COMPARISON:  None. FINDINGS: The patient has a fracture of the metaphysis of the distal radius with dorsal angulation of approximately 34 degrees and approximately 1/2 shaft width dorsal displacement. There may be an ulnar styloid fracture. Soft tissue swelling is seen about the wrist. No other abnormality is identified. IMPRESSION: Dorsally angulated and displaced fracture of the distal radius as described above. Possible ulnar styloid fracture also noted. Dedicated plain films of the wrist could be used for further evaluation. Electronically Signed   By: Drusilla Kannerhomas  Dalessio M.D.   On: 03/04/2016 14:41   Dg Wrist Complete Right  Result Date: 03/04/2016 CLINICAL DATA:  Fall Tuesday morning, pain. EXAM: RIGHT WRIST - COMPLETE 3+ VIEW COMPARISON:  None. FINDINGS: There is a displaced fracture of the distal right radius, with dorsal angulation of the distal fracture fragment and at least mild impaction at the fracture site. Carpal bones appear intact and normally aligned throughout. Distal ulna appears intact and normally aligned. IMPRESSION: Displaced fracture of the distal right radius, as detailed above. Electronically Signed   By: Bary RichardStan  Maynard M.D.   On: 03/04/2016 15:06     Visual Acuity Review  Right Eye Distance:   Left Eye Distance:   Bilateral Distance:    Right Eye Near:   Left Eye Near:    Bilateral Near:         MDM   1. Distal radius fracture, right, closed, initial encounter   2. Fall at home, initial encounter    Spoke with Zella Ballobin RN nurse at Dr. Merrilee SeashoreKuzma's office. She then contacted Dr. Merlyn LotKuzma. Instructions were to place the patient in a sugar tong splint and see  him in the office tomorrow. Patient is discharged with her daughter. Keep elevated. See the orthopedist tomorrow Distal neurovascular and motor sensory is intact. Radial pulse 2+.    Hayden Rasmussenavid Cresencia Asmus, NP 03/04/16 1537

## 2016-03-04 NOTE — Discharge Instructions (Signed)
Keep elevated. See the orthopedist tomorrow

## 2016-03-04 NOTE — ED Triage Notes (Signed)
Pt reports she fell on Tuesday morning getting out of bed onto carpet flooring and inj right wrist  Sx include: swelling, pain, hematoma to rt wrist  Daughter is at bedside  A&O x4... NAD

## 2016-03-04 NOTE — Progress Notes (Signed)
Orthopedic Tech Progress Note Patient Details:  Evelyn [name] [birth year] [id number]  Ortho Devices Type of Ortho Device: Ace wrap, Arm sling, Sugartong splint Ortho Device/Splint Location: RUE Ortho Device/Splint Interventions: Ordered, Application   Jennye MoccasinHughes, Lumir Demetriou Craig 03/04/2016, 4:02 PM

## 2016-03-04 NOTE — ED Notes (Signed)
Ortho tech has been notified 

## 2016-03-05 ENCOUNTER — Other Ambulatory Visit: Payer: Self-pay | Admitting: Orthopedic Surgery

## 2016-03-05 DIAGNOSIS — S52571A Other intraarticular fracture of lower end of right radius, initial encounter for closed fracture: Secondary | ICD-10-CM | POA: Diagnosis not present

## 2016-03-08 ENCOUNTER — Encounter (HOSPITAL_BASED_OUTPATIENT_CLINIC_OR_DEPARTMENT_OTHER): Payer: Self-pay | Admitting: *Deleted

## 2016-03-09 ENCOUNTER — Ambulatory Visit (HOSPITAL_BASED_OUTPATIENT_CLINIC_OR_DEPARTMENT_OTHER): Payer: Medicare Other | Admitting: Certified Registered"

## 2016-03-09 ENCOUNTER — Ambulatory Visit (HOSPITAL_BASED_OUTPATIENT_CLINIC_OR_DEPARTMENT_OTHER)
Admission: RE | Admit: 2016-03-09 | Discharge: 2016-03-09 | Disposition: A | Discharge status: Home / Self Care | Payer: Medicare Other | Source: Ambulatory Visit | Attending: Orthopedic Surgery | Admitting: Orthopedic Surgery

## 2016-03-09 ENCOUNTER — Encounter (HOSPITAL_BASED_OUTPATIENT_CLINIC_OR_DEPARTMENT_OTHER)
Admission: RE | Disposition: A | Discharge status: Home / Self Care | Payer: Self-pay | Source: Ambulatory Visit | Attending: Orthopedic Surgery

## 2016-03-09 ENCOUNTER — Encounter (HOSPITAL_BASED_OUTPATIENT_CLINIC_OR_DEPARTMENT_OTHER): Payer: Self-pay | Admitting: Certified Registered"

## 2016-03-09 DIAGNOSIS — W19XXXA Unspecified fall, initial encounter: Secondary | ICD-10-CM | POA: Insufficient documentation

## 2016-03-09 DIAGNOSIS — Z90710 Acquired absence of both cervix and uterus: Secondary | ICD-10-CM | POA: Diagnosis not present

## 2016-03-09 DIAGNOSIS — Y929 Unspecified place or not applicable: Secondary | ICD-10-CM | POA: Diagnosis not present

## 2016-03-09 DIAGNOSIS — Y939 Activity, unspecified: Secondary | ICD-10-CM | POA: Diagnosis not present

## 2016-03-09 DIAGNOSIS — N3020 Other chronic cystitis without hematuria: Secondary | ICD-10-CM | POA: Diagnosis not present

## 2016-03-09 DIAGNOSIS — S52501A Unspecified fracture of the lower end of right radius, initial encounter for closed fracture: Secondary | ICD-10-CM | POA: Diagnosis not present

## 2016-03-09 DIAGNOSIS — Z9842 Cataract extraction status, left eye: Secondary | ICD-10-CM | POA: Diagnosis not present

## 2016-03-09 DIAGNOSIS — S52551A Other extraarticular fracture of lower end of right radius, initial encounter for closed fracture: Secondary | ICD-10-CM | POA: Insufficient documentation

## 2016-03-09 DIAGNOSIS — H5441 Blindness, right eye, normal vision left eye: Secondary | ICD-10-CM | POA: Diagnosis not present

## 2016-03-09 DIAGNOSIS — E785 Hyperlipidemia, unspecified: Secondary | ICD-10-CM | POA: Insufficient documentation

## 2016-03-09 DIAGNOSIS — G8918 Other acute postprocedural pain: Secondary | ICD-10-CM | POA: Diagnosis not present

## 2016-03-09 DIAGNOSIS — M79631 Pain in right forearm: Secondary | ICD-10-CM | POA: Diagnosis not present

## 2016-03-09 HISTORY — PX: OPEN REDUCTION INTERNAL FIXATION (ORIF) DISTAL RADIAL FRACTURE: SHX5989

## 2016-03-09 SURGERY — OPEN REDUCTION INTERNAL FIXATION (ORIF) DISTAL RADIUS FRACTURE
Anesthesia: General | Site: Arm Lower | Laterality: Right

## 2016-03-09 MED ORDER — CEFAZOLIN SODIUM-DEXTROSE 2-4 GM/100ML-% IV SOLN
INTRAVENOUS | Status: AC
Start: 2016-03-09 — End: 2016-03-09
  Filled 2016-03-09: qty 100

## 2016-03-09 MED ORDER — MIDAZOLAM HCL 2 MG/2ML IJ SOLN
INTRAMUSCULAR | Status: AC
  Filled 2016-03-09: qty 2

## 2016-03-09 MED ORDER — FENTANYL CITRATE (PF) 100 MCG/2ML IJ SOLN
50.0000 ug | INTRAMUSCULAR | Status: DC | PRN
  Administered 2016-03-09: 50 ug via INTRAVENOUS

## 2016-03-09 MED ORDER — LIDOCAINE HCL (CARDIAC) 20 MG/ML IV SOLN
INTRAVENOUS | Status: DC | PRN
  Administered 2016-03-09: 60 mg via INTRAVENOUS

## 2016-03-09 MED ORDER — GLYCOPYRROLATE 0.2 MG/ML IJ SOLN: 0.2000 mg | Freq: Once | INTRAMUSCULAR | Status: DC | PRN

## 2016-03-09 MED ORDER — LACTATED RINGERS IV SOLN
INTRAVENOUS | Status: DC
  Administered 2016-03-09: 14:00:00 via INTRAVENOUS

## 2016-03-09 MED ORDER — ONDANSETRON HCL 4 MG/2ML IJ SOLN
INTRAMUSCULAR | Status: AC
Start: 2016-03-09 — End: 2016-03-09
  Filled 2016-03-09: qty 2

## 2016-03-09 MED ORDER — FENTANYL CITRATE (PF) 100 MCG/2ML IJ SOLN
INTRAMUSCULAR | Status: AC
  Filled 2016-03-09: qty 2

## 2016-03-09 MED ORDER — CHLORHEXIDINE GLUCONATE 4 % EX LIQD: 60.0000 mL | Freq: Once | CUTANEOUS | Status: DC

## 2016-03-09 MED ORDER — ONDANSETRON HCL 4 MG/2ML IJ SOLN
INTRAMUSCULAR | Status: AC
  Filled 2016-03-09: qty 2

## 2016-03-09 MED ORDER — ONDANSETRON HCL 4 MG/2ML IJ SOLN
4.0000 mg | Freq: Once | INTRAMUSCULAR | Status: AC
  Administered 2016-03-09: 4 mg via INTRAVENOUS

## 2016-03-09 MED ORDER — PROPOFOL 10 MG/ML IV BOLUS
INTRAVENOUS | Status: AC
  Filled 2016-03-09: qty 20

## 2016-03-09 MED ORDER — PROPOFOL 10 MG/ML IV BOLUS
INTRAVENOUS | Status: DC | PRN
  Administered 2016-03-09: 100 mg via INTRAVENOUS
  Administered 2016-03-09: 50 mg via INTRAVENOUS

## 2016-03-09 MED ORDER — DEXAMETHASONE SODIUM PHOSPHATE 10 MG/ML IJ SOLN
INTRAMUSCULAR | Status: DC | PRN
  Administered 2016-03-09: 10 mg via INTRAVENOUS

## 2016-03-09 MED ORDER — LIDOCAINE 2% (20 MG/ML) 5 ML SYRINGE
INTRAMUSCULAR | Status: AC
Start: 2016-03-09 — End: 2016-03-09
  Filled 2016-03-09: qty 5

## 2016-03-09 MED ORDER — ONDANSETRON HCL 4 MG/2ML IJ SOLN
INTRAMUSCULAR | Status: DC | PRN
  Administered 2016-03-09: 4 mg via INTRAVENOUS

## 2016-03-09 MED ORDER — CEFAZOLIN SODIUM-DEXTROSE 2-4 GM/100ML-% IV SOLN
2.0000 g | INTRAVENOUS | Status: AC
  Administered 2016-03-09: 2 g via INTRAVENOUS

## 2016-03-09 MED ORDER — DEXAMETHASONE SODIUM PHOSPHATE 10 MG/ML IJ SOLN
INTRAMUSCULAR | Status: AC
  Filled 2016-03-09: qty 1

## 2016-03-09 MED ORDER — MIDAZOLAM HCL 2 MG/2ML IJ SOLN: 1.0000 mg | INTRAMUSCULAR | Status: DC | PRN

## 2016-03-09 MED ORDER — FENTANYL CITRATE (PF) 100 MCG/2ML IJ SOLN: 25.0000 ug | INTRAMUSCULAR | Status: DC | PRN

## 2016-03-09 MED ORDER — EPHEDRINE SULFATE 50 MG/ML IJ SOLN
INTRAMUSCULAR | Status: DC | PRN
  Administered 2016-03-09 (×2): 10 mg via INTRAVENOUS

## 2016-03-09 MED ORDER — SCOPOLAMINE 1 MG/3DAYS TD PT72: 1.0000 | MEDICATED_PATCH | Freq: Once | TRANSDERMAL | Status: DC | PRN

## 2016-03-09 MED ORDER — MEPERIDINE HCL 25 MG/ML IJ SOLN: 6.2500 mg | INTRAMUSCULAR | Status: DC | PRN

## 2016-03-09 MED ORDER — OXYCODONE-ACETAMINOPHEN 5-325 MG PO TABS: ORAL_TABLET | ORAL | 0 refills | Status: DC

## 2016-03-09 SURGICAL SUPPLY — 75 items
BANDAGE ACE 3X5.8 VEL STRL LF (GAUZE/BANDAGES/DRESSINGS) ×3 IMPLANT
BIT DRILL 2.0 LNG QUCK RELEASE (BIT) IMPLANT
BIT DRILL 2.8X5 QR DISP (BIT) ×2 IMPLANT
BLADE SURG 15 STRL LF DISP TIS (BLADE) ×2 IMPLANT
BLADE SURG 15 STRL SS (BLADE) ×6
BNDG CMPR 9X4 STRL LF SNTH (GAUZE/BANDAGES/DRESSINGS) ×1
BNDG ESMARK 4X9 LF (GAUZE/BANDAGES/DRESSINGS) ×3 IMPLANT
BNDG GAUZE ELAST 4 BULKY (GAUZE/BANDAGES/DRESSINGS) ×3 IMPLANT
BNDG PLASTER X FAST 3X3 WHT LF (CAST SUPPLIES) ×30 IMPLANT
BNDG PLSTR 9X3 FST ST WHT (CAST SUPPLIES) ×10
CHLORAPREP W/TINT 26ML (MISCELLANEOUS) ×3 IMPLANT
CORDS BIPOLAR (ELECTRODE) ×3 IMPLANT
COVER BACK TABLE 60X90IN (DRAPES) ×3 IMPLANT
COVER MAYO STAND STRL (DRAPES) ×3 IMPLANT
CUFF TOURNIQUET SINGLE 18IN (TOURNIQUET CUFF) ×2 IMPLANT
CUFF TOURNIQUET SINGLE 24IN (TOURNIQUET CUFF) IMPLANT
DRAPE EXTREMITY T 121X128X90 (DRAPE) ×3 IMPLANT
DRAPE OEC MINIVIEW 54X84 (DRAPES) ×3 IMPLANT
DRAPE SURG 17X23 STRL (DRAPES) ×3 IMPLANT
DRILL 2.0 LNG QUICK RELEASE (BIT) ×3
GAUZE SPONGE 4X4 12PLY STRL (GAUZE/BANDAGES/DRESSINGS) ×3 IMPLANT
GAUZE XEROFORM 1X8 LF (GAUZE/BANDAGES/DRESSINGS) ×3 IMPLANT
GLOVE BIO SURGEON STRL SZ 6.5 (GLOVE) ×1 IMPLANT
GLOVE BIO SURGEON STRL SZ7.5 (GLOVE) ×3 IMPLANT
GLOVE BIO SURGEONS STRL SZ 6.5 (GLOVE) ×1
GLOVE BIOGEL PI IND STRL 7.0 (GLOVE) IMPLANT
GLOVE BIOGEL PI IND STRL 8 (GLOVE) ×1 IMPLANT
GLOVE BIOGEL PI IND STRL 8.5 (GLOVE) IMPLANT
GLOVE BIOGEL PI INDICATOR 7.0 (GLOVE) ×2
GLOVE BIOGEL PI INDICATOR 8 (GLOVE) ×2
GLOVE BIOGEL PI INDICATOR 8.5 (GLOVE) ×2
GLOVE ECLIPSE 6.5 STRL STRAW (GLOVE) ×2 IMPLANT
GLOVE SURG ORTHO 8.0 STRL STRW (GLOVE) ×2 IMPLANT
GOWN STRL REUS W/ TWL LRG LVL3 (GOWN DISPOSABLE) ×1 IMPLANT
GOWN STRL REUS W/TWL LRG LVL3 (GOWN DISPOSABLE) ×3
GOWN STRL REUS W/TWL XL LVL3 (GOWN DISPOSABLE) ×5 IMPLANT
GUIDEWIRE ORTHO 0.054X6 (WIRE) ×6 IMPLANT
NDL HYPO 25X1 1.5 SAFETY (NEEDLE) IMPLANT
NEEDLE HYPO 25X1 1.5 SAFETY (NEEDLE) IMPLANT
NS IRRIG 1000ML POUR BTL (IV SOLUTION) ×3 IMPLANT
PACK BASIN DAY SURGERY FS (CUSTOM PROCEDURE TRAY) ×3 IMPLANT
PAD CAST 3X4 CTTN HI CHSV (CAST SUPPLIES) ×1 IMPLANT
PADDING CAST ABS 4INX4YD NS (CAST SUPPLIES) ×2
PADDING CAST ABS COTTON 4X4 ST (CAST SUPPLIES) ×1 IMPLANT
PADDING CAST COTTON 3X4 STRL (CAST SUPPLIES) ×3
PLATE PROXIMAL VDU ACULOC (Plate) ×2 IMPLANT
SCREW BN FT 16X2.3XLCK HEX CRT (Screw) IMPLANT
SCREW CORT FT 18X2.3XLCK HEX (Screw) IMPLANT
SCREW CORTICAL LOCKING 2.3X16M (Screw) ×12 IMPLANT
SCREW CORTICAL LOCKING 2.3X18M (Screw) ×6 IMPLANT
SCREW FX16X2.3XLCK SMTH NS CRT (Screw) IMPLANT
SCREW FX18X2.3XSMTH LCK NS CRT (Screw) IMPLANT
SCREW NLCKG 13 3.5X13 HEXA (Screw) IMPLANT
SCREW NON LOCK 3.5X10MM (Screw) ×2 IMPLANT
SCREW NON-LOCK 3.5X13 (Screw) ×3 IMPLANT
SCREW NONLOCK HEX 3.5X12 (Screw) ×2 IMPLANT
SLEEVE SCD COMPRESS KNEE MED (MISCELLANEOUS) ×2 IMPLANT
SLING ARM FOAM STRAP LRG (SOFTGOODS) ×2 IMPLANT
STOCKINETTE 4X48 STRL (DRAPES) ×3 IMPLANT
SUCTION FRAZIER HANDLE 10FR (MISCELLANEOUS) ×2
SUCTION TUBE FRAZIER 10FR DISP (MISCELLANEOUS) IMPLANT
SUT ETHILON 3 0 PS 1 (SUTURE) IMPLANT
SUT ETHILON 4 0 PS 2 18 (SUTURE) ×3 IMPLANT
SUT VIC AB 2-0 SH 27 (SUTURE)
SUT VIC AB 2-0 SH 27XBRD (SUTURE) IMPLANT
SUT VIC AB 3-0 PS1 18 (SUTURE)
SUT VIC AB 3-0 PS1 18XBRD (SUTURE) IMPLANT
SUT VICRYL 4-0 PS2 18IN ABS (SUTURE) ×3 IMPLANT
SYR BULB 3OZ (MISCELLANEOUS) ×3 IMPLANT
SYR CONTROL 10ML LL (SYRINGE) IMPLANT
TOWEL OR 17X24 6PK STRL BLUE (TOWEL DISPOSABLE) ×6 IMPLANT
TOWEL OR NON WOVEN STRL DISP B (DISPOSABLE) ×3 IMPLANT
TUBE CONNECTING 20'X1/4 (TUBING) ×1
TUBE CONNECTING 20X1/4 (TUBING) ×1 IMPLANT
UNDERPAD 30X30 (UNDERPADS AND DIAPERS) ×3 IMPLANT

## 2016-03-09 NOTE — Anesthesia Preprocedure Evaluation (Signed)
Anesthesia Evaluation  Patient identified by MRN, date of birth, ID band Patient awake    Reviewed: Allergy & Precautions, NPO status , Patient's Chart, lab work & pertinent test results  Airway Mallampati: I  TM Distance: >3 FB Neck ROM: Full    Dental  (+) Teeth Intact, Dental Advisory Given   Pulmonary    breath sounds clear to auscultation       Cardiovascular  Rhythm:Regular Rate:Normal     Neuro/Psych    GI/Hepatic   Endo/Other    Renal/GU      Musculoskeletal   Abdominal   Peds  Hematology   Anesthesia Other Findings   Reproductive/Obstetrics                             Anesthesia Physical Anesthesia Plan  ASA: I  Anesthesia Plan: General   Post-op Pain Management: GA combined w/ Regional for post-op pain   Induction: Intravenous  Airway Management Planned: LMA  Additional Equipment:   Intra-op Plan:   Post-operative Plan: Extubation in OR  Informed Consent: I have reviewed the patients History and Physical, chart, labs and discussed the procedure including the risks, benefits and alternatives for the proposed anesthesia with the patient or authorized representative who has indicated his/her understanding and acceptance.   Dental advisory given  Plan Discussed with: CRNA, Anesthesiologist and Surgeon  Anesthesia Plan Comments:         Anesthesia Quick Evaluation  

## 2016-03-09 NOTE — Anesthesia Procedure Notes (Addendum)
Anesthesia Regional Block:  Infraclavicular brachial plexus block  Pre-Anesthetic Checklist: ,, timeout performed, Correct Patient, Correct Site, Correct Laterality, Correct Procedure, Correct Position, site marked, Risks and benefits discussed,  Surgical consent,  Pre-op evaluation,  At surgeon's request and post-op pain management  Laterality: Right and Upper  Prep: chloraprep       Needles:  Injection technique: Single-shot  Needle Type: Echogenic Stimulator Needle     Needle Length: 5cm 5 cm Needle Gauge: 21 and 21 G    Additional Needles:  Procedures: ultrasound guided (picture in chart) Infraclavicular brachial plexus block Narrative:  Start time: 03/09/2016 2:52 PM End time: 03/09/2016 2:57 PM Injection made incrementally with aspirations every 5 mL.  Performed by: Personally  Anesthesiologist: Cordaryl Decelles       Right infraclavicular block image

## 2016-03-09 NOTE — Discharge Instructions (Addendum)

## 2016-03-09 NOTE — H&P (Signed)
  Evelyn EdenJoan S [name] is an 82 y.o. female.   Chief Complaint: right distal radius fracture HPI: 82 yo rhd female states she fell in her home last week injuring her right wrist.  Seen at UC where XR revealed a distal radius fracture.  Splinted and followed up in office.  Allergies: No Known Allergies  Past Medical History:  Diagnosis Date  . Chronic cystitis   . Congenital blindness    right eye  . H/O: hematuria   . Hepatitis, unspecified    statin related, resolved quickly  . Hyperlipidemia   . Legally blind in right eye, as defined in [place]   . Syncope and collapse 04/01/2014  . UTI (lower urinary tract infection)    HISTORY OF UTI    Past Surgical History:  Procedure Laterality Date  . ABDOMINAL HYSTERECTOMY     vaginal  . BLADDER SUSPENSION     A-P  . CATARACT EXTRACTION W/ INTRAOCULAR LENS IMPLANT Left     Family History: Family History  Problem Relation Age of Onset  . Cancer Neg Hx     Colon cancer  . Stroke Neg Hx   . Heart disease Neg Hx   . Hyperlipidemia Neg Hx   . Hypertension Neg Hx   . Kidney disease Neg Hx     Social History:   reports that she has never smoked. She has never used smokeless tobacco. She reports that she does not drink alcohol or use drugs.  Medications: No prescriptions prior to admission.    No results found for this or any previous visit (from the past 48 hour(s)).  No results found.   A comprehensive review of systems was negative.  Height 5\' 4"  (1.626 m), weight 53.1 kg (117 lb).  General appearance: alert, cooperative and appears stated age Head: Normocephalic, without obvious abnormality, atraumatic Neck: supple, symmetrical, trachea midline Resp: clear to auscultation bilaterally Cardio: regular rate and rhythm GI: non-tender Extremities: Intact sensation and capillary refill all digits.  +epl/fpl/io.  No wounds.  Pulses: 2+ and symmetric Skin: Skin color, texture, turgor normal. No rashes or lesions Neurologic:  Grossly normal Incision/Wound:none  Assessment/Plan Right distal radius fracture.  Non operative and operative treatment options were discussed with the patient and patient wishes to proceed with operative treatment. Risks, benefits, and alternatives of surgery were discussed and the patient agrees with the plan of care.   Efrata Brunner R 03/09/2016, 1:18 PM

## 2016-03-09 NOTE — Progress Notes (Signed)
Assisted Dr. Crews with right, ultrasound guided, infraclavicular block. Side rails up, monitors on throughout procedure. See vital signs in flow sheet. Tolerated Procedure well. 

## 2016-03-09 NOTE — Brief Op Note (Signed)
03/09/2016  4:27 PM  PATIENT:  Virgina EvenerJoan S Shrode  82 y.o. female  PRE-OPERATIVE DIAGNOSIS:  right distal radius fracture  POST-OPERATIVE DIAGNOSIS:  right distal radius fracture  PROCEDURE:  Procedure(s): OPEN REDUCTION INTERNAL FIXATION (ORIF) DISTAL RADIAL FRACTURE (Right)  SURGEON:  Surgeon(s) and Role:    * Betha LoaKevin Kyle Luppino, MD - Primary    * Cindee SaltGary Lyndel Dancel, MD - Assisting  PHYSICIAN ASSISTANT:   ASSISTANTS: Cindee SaltGary Joanell Cressler, MD   ANESTHESIA:   regional and general  EBL:  Total I/O In: 800 [I.V.:800] Out: -   BLOOD ADMINISTERED:none  DRAINS: none   LOCAL MEDICATIONS USED:  NONE  SPECIMEN:  No Specimen  DISPOSITION OF SPECIMEN:  N/A  COUNTS:  YES  TOURNIQUET:   Total Tourniquet Time Documented: Upper Arm (Right) - 39 minutes Total: Upper Arm (Right) - 39 minutes   DICTATION: .Note written in EPIC  PLAN OF CARE: Discharge to home after PACU  PATIENT DISPOSITION:  PACU - hemodynamically stable.

## 2016-03-09 NOTE — Transfer of Care (Signed)
Immediate Anesthesia Transfer of Care Note  Patient: Hans EdenJoan S Kinne  Procedure(s) Performed: Procedure(s): OPEN REDUCTION INTERNAL FIXATION (ORIF) DISTAL RADIAL FRACTURE (Right)  Patient Location: PACU  Anesthesia Type:GA combined with regional for post-op pain  Level of Consciousness: sedated  Airway & Oxygen Therapy: Patient Spontanous Breathing and Patient connected to face mask oxygen  Post-op Assessment: Report given to RN and Post -op Vital signs reviewed and stable  Post vital signs: Reviewed and stable  Last Vitals:  Vitals:   03/09/16 1503 03/09/16 1504  BP:    Pulse: 72 71  Resp: (!) 25 (!) 24  Temp:      Last Pain:  Vitals:   03/09/16 1411  TempSrc: Oral  PainSc: 7       Patients Stated Pain Goal: 4 (03/09/16 1411)  Complications: No apparent anesthesia complications

## 2016-03-09 NOTE — Anesthesia Procedure Notes (Signed)
Procedure Name: LMA Insertion Date/Time: 03/09/2016 3:34 PM Performed by: Burna CashONRAD, Heith Haigler C Pre-anesthesia Checklist: Patient identified, Emergency Drugs available, Suction available and Patient being monitored Patient Re-evaluated:Patient Re-evaluated prior to inductionOxygen Delivery Method: Circle system utilized Preoxygenation: Pre-oxygenation with 100% oxygen Intubation Type: IV induction Ventilation: Mask ventilation without difficulty LMA: LMA inserted LMA Size: 4.0 Number of attempts: 1 Airway Equipment and Method: Bite block Placement Confirmation: positive ETCO2 Tube secured with: Tape Dental Injury: Teeth and Oropharynx as per pre-operative assessment

## 2016-03-09 NOTE — Op Note (Signed)
03/09/2016 [facility]  Operative Note  Pre Op Diagnosis: Right extraarticular distal radius fracture  Post Op Diagnosis: Right extraarticular distal radius fracture  Procedure: ORIF Right extraarticular distal radius fracture   Surgeon: Betha LoaKevin Nirvi Boehler, MD  Assistant: Cindee SaltGary Akeia Perot, MD  Anesthesia: General and Regional  Fluids: Per anesthesia flow sheet  EBL: minimal  Complications: None  Specimen: None  Tourniquet Time:  Total Tourniquet Time Documented: Upper Arm (Right) - 39 minutes Total: Upper Arm (Right) - 39 minutes   Disposition: Stable to PACU  INDICATIONS:  Evelyn [name] is a 82 y.o. female who fell in her home last week, injuring her right wrist.  Radiographs taken at urgent care revealed right distal radius fracture with dorsal angulation.  She was splinted and followed up in the office.  We discussed nonoperative and operative treatment options.  She wished to proceed with operative fixation.  Risks, benefits, and alternatives of surgery were discussed including the risk of blood loss; infection; damage to nerves, vessels, tendons, ligaments, bone; failure of surgery; need for additional surgery; complications with wound healing; continued pain; nonunion; malunion; stiffness.  We also discussed the possible need for bone graft and the benefits and risks including the possibility of disease transmission.  She voiced understanding of these risks and elected to proceed.   OPERATIVE COURSE:  After being identified preoperatively by myself, the patient and I agreed upon the procedure and site of procedure.  Surgical site was marked.  The risks, benefits and alternatives of the surgery were reviewed and she wished to proceed.  Surgical consent had been signed.  She was given IV Ancef as preoperative antibiotic prophylaxis.  She was transferred to the operating room and placed on the operating room table in supine position with the Right upper extremity on an  armboard. General and Regional anesthesia was induced by the anesthesiologist.  The Right upper extremity was prepped and draped in normal sterile orthopedic fashion.  A surgical pause was performed between the surgeons, anesthesia and operating room staff, and all were in agreement as to the patient, procedure and site of procedure.  Tourniquet at the proximal aspect of the extremity was inflated to 250 mmHg after exsanguination of the limb with an Esmarch bandage.  Standard volar Sherilyn CooterHenry approach was used.  The bipolar electrocautery was used to obtain hemostasis.  The superficial and deep portions of the FCR tendon sheath were incised, and the FCR and FPL were swept ulnarly to protect the palmar cutaneous branch of the median nerve.  The brachioradialis was released at the radial side of the radius.  The pronator quadratus was released and elevated with the periosteal elevator.  The fracture site was identified and cleared of soft tissue interposition and hematoma.  It was reduced under direct visualization.  There did not appear to be intraarticular extension.   An AcuMed volar distal radial locking plate was selected.  It was secured to the bone with the guidepins.  C-arm was used in AP and lateral projections to ensure appropriate reduction and position of the hardware and adjustments made as necessary.  Standard AO drilling and measuring technique was used.  A single screw was placed in the slotted hole in the shaft of the plate.  The distal holes were filled with locking pegs with the exception of the styloid holes, which were filled with locking screws.  The remaining holes in the shaft of the plate were filled with nonlocking screws.  Good purchase was obtained.  C-arm  was used in AP, lateral and oblique projections to ensure appropriate reduction and position of hardware, which was the case.  There was no intra-articular penetration.  The wound was copiously irrigated with sterile saline.  Pronator  quadratus was repaired back over top of the plate using 4-0 Vicryl suture.  Vicryl suture was placed in the subcutaneous tissues in an inverted interrupted fashion and the skin was closed with 4-0 nylon in a horizontal mattress fashion.  There was good pronation and supination of the wrist without crepitance.  The wound was then dressed with sterile Xeroform, 4x4s, and wrapped with a Kerlix bandage.  A volar splint was placed and wrapped with Kerlix and Ace bandage.  Tourniquet was deflated at 39 minutes.  Fingertips were pink with brisk capillary refill after deflation of the tourniquet.  Operative drapes were broken down.  The patient was awoken from anesthesia safely.  He was transferred back to the stretcher and taken to the PACU in stable condition.  I will see him back in the office in one week for postoperative followup.  I will give her a prescription for percocet 5/325 1/2 - 1  tabs PO q6 hours prn pain, dispense #20.    Evelyn Ribas, MD Electronically signed, 03/09/16

## 2016-03-09 NOTE — Anesthesia Postprocedure Evaluation (Signed)
Anesthesia Post Note  Patient: Hans EdenJoan S Oehler  Procedure(s) Performed: Procedure(s) (LRB): OPEN REDUCTION INTERNAL FIXATION (ORIF) DISTAL RADIAL FRACTURE (Right)  Patient location during evaluation: PACU Anesthesia Type: General Level of consciousness: awake and alert Pain management: pain level controlled Vital Signs Assessment: post-procedure vital signs reviewed and stable Respiratory status: spontaneous breathing, nonlabored ventilation and respiratory function stable Cardiovascular status: blood pressure returned to baseline and stable Postop Assessment: no signs of nausea or vomiting Anesthetic complications: no    Last Vitals:  Vitals:   03/09/16 1645 03/09/16 1700  BP: (!) 145/42 (!) 151/69  Pulse: 86 81  Resp: 20 15  Temp: 36.4 C     Last Pain:  Vitals:   03/09/16 1700  TempSrc:   PainSc: 0-No pain                 Jailen Coward A

## 2016-03-09 NOTE — Op Note (Signed)
I assisted Surgeon(s) and Role:    * Betha LoaKevin Miriana Gaertner, MD - Primary    * Cindee SaltGary Ruhama Lehew, MD - Assisting on the Procedure(s): OPEN REDUCTION INTERNAL FIXATION (ORIF) DISTAL RADIAL FRACTURE on 03/09/2016.  I provided assistance on this case as follows: approach, retraction, isolation of the fracture ,reduction, application of the internal fixation, closure of the wound,application of the dressing and splint. I was present for the entire case.  Electronically signed by: Nicki ReaperKUZMA,Brynn Mulgrew R, MD Date: 03/09/2016 Time: 4:30 PM

## 2016-03-10 ENCOUNTER — Encounter (HOSPITAL_BASED_OUTPATIENT_CLINIC_OR_DEPARTMENT_OTHER): Payer: Self-pay | Admitting: Orthopedic Surgery

## 2016-03-10 NOTE — Addendum Note (Signed)
Addendum  created 03/10/16 1017 by Lance CoonWesley Emnet Monk, CRNA   Charge Capture section accepted

## 2016-03-15 DIAGNOSIS — M25531 Pain in right wrist: Secondary | ICD-10-CM | POA: Diagnosis not present

## 2016-03-15 DIAGNOSIS — S52571D Other intraarticular fracture of lower end of right radius, subsequent encounter for closed fracture with routine healing: Secondary | ICD-10-CM | POA: Diagnosis not present

## 2016-03-22 DIAGNOSIS — Z23 Encounter for immunization: Secondary | ICD-10-CM | POA: Diagnosis not present

## 2016-04-13 DIAGNOSIS — H31001 Unspecified chorioretinal scars, right eye: Secondary | ICD-10-CM | POA: Diagnosis not present

## 2016-04-13 DIAGNOSIS — H2511 Age-related nuclear cataract, right eye: Secondary | ICD-10-CM | POA: Diagnosis not present

## 2016-04-13 DIAGNOSIS — H52202 Unspecified astigmatism, left eye: Secondary | ICD-10-CM | POA: Diagnosis not present

## 2016-04-19 DIAGNOSIS — S52551D Other extraarticular fracture of lower end of right radius, subsequent encounter for closed fracture with routine healing: Secondary | ICD-10-CM | POA: Diagnosis not present

## 2016-05-28 ENCOUNTER — Other Ambulatory Visit: Payer: Self-pay

## 2016-06-01 DIAGNOSIS — S52551D Other extraarticular fracture of lower end of right radius, subsequent encounter for closed fracture with routine healing: Secondary | ICD-10-CM | POA: Diagnosis not present

## 2016-07-06 ENCOUNTER — Encounter: Payer: Self-pay | Admitting: Internal Medicine

## 2016-07-06 ENCOUNTER — Ambulatory Visit (INDEPENDENT_AMBULATORY_CARE_PROVIDER_SITE_OTHER): Payer: Medicare Other | Admitting: Internal Medicine

## 2016-07-06 VITALS — BP 130/80 | HR 63 | Temp 97.9°F | Resp 16 | Ht 64.0 in | Wt 111.0 lb

## 2016-07-06 DIAGNOSIS — K5900 Constipation, unspecified: Secondary | ICD-10-CM

## 2016-07-06 DIAGNOSIS — Z23 Encounter for immunization: Secondary | ICD-10-CM | POA: Diagnosis not present

## 2016-07-06 DIAGNOSIS — F5101 Primary insomnia: Secondary | ICD-10-CM

## 2016-07-06 DIAGNOSIS — H918X3 Other specified hearing loss, bilateral: Secondary | ICD-10-CM | POA: Diagnosis not present

## 2016-07-06 DIAGNOSIS — E871 Hypo-osmolality and hyponatremia: Secondary | ICD-10-CM

## 2016-07-06 DIAGNOSIS — H9190 Unspecified hearing loss, unspecified ear: Secondary | ICD-10-CM | POA: Insufficient documentation

## 2016-07-06 MED ORDER — ZOLPIDEM TARTRATE 10 MG PO TABS: 5.0000 mg | ORAL_TABLET | Freq: Every evening | ORAL | 3 refills | Status: DC | PRN

## 2016-07-06 NOTE — Patient Instructions (Signed)
Insomnia Insomnia is a sleep disorder that makes it difficult to fall asleep or to stay asleep. Insomnia can cause tiredness (fatigue), low energy, difficulty concentrating, mood swings, and poor performance at work or school. There are three different ways to classify insomnia:  Difficulty falling asleep.  Difficulty staying asleep.  Waking up too early in the morning. Any type of insomnia can be long-term (chronic) or short-term (acute). Both are common. Short-term insomnia usually lasts for three months or less. Chronic insomnia occurs at least three times a week for longer than three months. What are the causes? Insomnia may be caused by another condition, situation, or substance, such as:  Anxiety.  Certain medicines.  Gastroesophageal reflux disease (GERD) or other gastrointestinal conditions.  Asthma or other breathing conditions.  Restless legs syndrome, sleep apnea, or other sleep disorders.  Chronic pain.  Menopause. This may include hot flashes.  Stroke.  Abuse of alcohol, tobacco, or illegal drugs.  Depression.  Caffeine.  Neurological disorders, such as Alzheimer disease.  An overactive thyroid (hyperthyroidism). The cause of insomnia may not be known. What increases the risk? Risk factors for insomnia include:  Gender. Women are more commonly affected than men.  Age. Insomnia is more common as you get older.  Stress. This may involve your professional or personal life.  Income. Insomnia is more common in people with lower income.  Lack of exercise.  Irregular work schedule or night shifts.  Traveling between different time zones. What are the signs or symptoms? If you have insomnia, trouble falling asleep or trouble staying asleep is the main symptom. This may lead to other symptoms, such as:  Feeling fatigued.  Feeling nervous about going to sleep.  Not feeling rested in the morning.  Having trouble concentrating.  Feeling irritable,  anxious, or depressed. How is this treated? Treatment for insomnia depends on the cause. If your insomnia is caused by an underlying condition, treatment will focus on addressing the condition. Treatment may also include:  Medicines to help you sleep.  Counseling or therapy.  Lifestyle adjustments. Follow these instructions at home:  Take medicines only as directed by your health care provider.  Keep regular sleeping and waking hours. Avoid naps.  Keep a sleep diary to help you and your health care provider figure out what could be causing your insomnia. Include:  When you sleep.  When you wake up during the night.  How well you sleep.  How rested you feel the next day.  Any side effects of medicines you are taking.  What you eat and drink.  Make your bedroom a comfortable place where it is easy to fall asleep:  Put up shades or special blackout curtains to block light from outside.  Use a white noise machine to block noise.  Keep the temperature cool.  Exercise regularly as directed by your health care provider. Avoid exercising right before bedtime.  Use relaxation techniques to manage stress. Ask your health care provider to suggest some techniques that may work well for you. These may include:  Breathing exercises.  Routines to release muscle tension.  Visualizing peaceful scenes.  Cut back on alcohol, caffeinated beverages, and cigarettes, especially close to bedtime. These can disrupt your sleep.  Do not overeat or eat spicy foods right before bedtime. This can lead to digestive discomfort that can make it hard for you to sleep.  Limit screen use before bedtime. This includes:  Watching TV.  Using your smartphone, tablet, and computer.  Stick to a   routine. This can help you fall asleep faster. Try to do a quiet activity, brush your teeth, and go to bed at the same time each night.  Get out of bed if you are still awake after 15 minutes of trying to  sleep. Keep the lights down, but try reading or doing a quiet activity. When you feel sleepy, go back to bed.  Make sure that you drive carefully. Avoid driving if you feel very sleepy.  Keep all follow-up appointments as directed by your health care provider. This is important. Contact a health care provider if:  You are tired throughout the day or have trouble in your daily routine due to sleepiness.  You continue to have sleep problems or your sleep problems get worse. Get help right away if:  You have serious thoughts about hurting yourself or someone else. This information is not intended to replace advice given to you by your health care provider. Make sure you discuss any questions you have with your health care provider. Document Released: 06/04/2000 Document Revised: 11/07/2015 Document Reviewed: 03/08/2014 Elsevier Interactive Patient Education  2017 [employer]  

## 2016-07-06 NOTE — Progress Notes (Signed)
Subjective:  Patient ID: Evelyn [name], female    DOB: April 21, 1925  Age: 83 y.o. MRN: [id number]  CC: Constipation   HPI Evelyn EdenJoan S Grinnell presents for f/up on constipation. She is getting adequate symptom relief with Linzess. She denies any recent episodes of abdominal pain, nausea, vomiting, loss of appetite, or weight loss. She does complain of chronic fatigue, intermittent episodes of low back pain, poor hearing, and chronic insomnia. She wants to be referred to an audiologist to have her hearing tested.  Outpatient Medications Prior to Visit  Medication Sig Dispense Refill  . FLUoxetine (PROZAC) 20 MG capsule TAKE 1 CAPSULE BY MOUTH DAILY 90 capsule 1  . Linaclotide (LINZESS) 290 MCG CAPS capsule Take 1 capsule (290 mcg total) by mouth daily. 90 capsule 3  . ibuprofen (ADVIL,MOTRIN) 200 MG tablet Take 400 mg by mouth every 6 (six) hours as needed.    Marland Kitchen. oxyCODONE-acetaminophen (PERCOCET) 5-325 MG tablet 1/2 - 1 tabs po q6 hours prn pain 20 tablet 0  . polyethylene glycol (MIRALAX / GLYCOLAX) packet Take 17 g by mouth 2 (two) times daily. Hold if develops diarrhea 30 each 0  . zolpidem (AMBIEN) 10 MG tablet Take 0.5 tablets (5 mg total) by mouth at bedtime as needed for sleep. 30 tablet 3   No facility-administered medications prior to visit.     ROS Review of Systems  Constitutional: Positive for fatigue. Negative for appetite change, chills and unexpected weight change.  HENT: Negative.  Negative for trouble swallowing.   Eyes: Negative.  Negative for visual disturbance.  Respiratory: Negative for cough, chest tightness, shortness of breath and wheezing.   Cardiovascular: Negative for chest pain, palpitations and leg swelling.  Gastrointestinal: Positive for constipation. Negative for abdominal pain, anal bleeding, diarrhea, nausea and vomiting.  Endocrine: Negative.  Negative for cold intolerance and heat intolerance.  Genitourinary: Negative.   Musculoskeletal: Positive for back  pain. Negative for arthralgias, myalgias and neck pain.  Skin: Negative.   Allergic/Immunologic: Negative.   Neurological: Negative for dizziness.  Hematological: Negative.  Negative for adenopathy. Does not bruise/bleed easily.  Psychiatric/Behavioral: Positive for sleep disturbance. Negative for behavioral problems, decreased concentration and dysphoric mood. The patient is not nervous/anxious.     Objective:  BP 130/80 (BP Location: Right Arm, Cuff Size: Normal)   Pulse 63   Temp 97.9 F (36.6 C) (Oral)   Resp 16   Ht 5\' 4"  (1.626 m)   Wt 111 lb (50.3 kg)   SpO2 97%   BMI 19.05 kg/m   BP Readings from Last 3 Encounters:  07/06/16 130/80  03/09/16 (!) 138/54  03/04/16 132/80    Wt Readings from Last 3 Encounters:  07/06/16 111 lb (50.3 kg)  03/09/16 110 lb (49.9 kg)  12/11/15 117 lb (53.1 kg)    Physical Exam  Constitutional: She is oriented to person, place, and time. No distress.  HENT:  Mouth/Throat: Oropharynx is clear and moist. No oropharyngeal exudate.  Eyes: Conjunctivae are normal. Right eye exhibits no discharge. Left eye exhibits no discharge. No scleral icterus.  Neck: Normal range of motion. Neck supple. No JVD present. No tracheal deviation present. No thyromegaly present.  Cardiovascular: Normal rate, regular rhythm, normal heart sounds and intact distal pulses.  Exam reveals no gallop and no friction rub.   No murmur heard. Pulmonary/Chest: Effort normal and breath sounds normal. No stridor. No respiratory distress. She has no wheezes. She has no rales. She exhibits no tenderness.  Abdominal: Soft. Bowel sounds  are normal. She exhibits no distension and no mass. There is no tenderness. There is no rebound and no guarding.  Musculoskeletal: Normal range of motion. She exhibits no edema, tenderness or deformity.  Lymphadenopathy:    She has no cervical adenopathy.  Neurological: She is oriented to person, place, and time.  Skin: Skin is warm and dry. No  rash noted. She is not diaphoretic. No erythema. No pallor.  Psychiatric: She has a normal mood and affect. Her behavior is normal. Judgment and thought content normal.  Vitals reviewed.   Lab Results  Component Value Date   WBC 6.4 12/11/2015   HGB 13.0 12/11/2015   HCT 39.0 12/11/2015   PLT 200.0 12/11/2015   GLUCOSE 81 12/11/2015   CHOL 193 12/11/2015   TRIG 95.0 12/11/2015   HDL 59.30 12/11/2015   LDLDIRECT 146.5 05/03/2013   LDLCALC 115 (H) 12/11/2015   ALT 13 12/11/2015   AST 24 12/11/2015   NA 137 12/11/2015   K 5.3 (H) 12/11/2015   CL 113 (H) 12/11/2015   CREATININE 0.92 12/11/2015   BUN 27 (H) 12/11/2015   CO2 25 12/11/2015   TSH 1.82 12/11/2015   INR 1.1 (H) 08/13/2010   HGBA1C 5.7 12/05/2014    No results found.  Assessment & Plan:   Bellany was seen today for constipation.  Diagnoses and all orders for this visit:  Hyponatremia  Constipation, unspecified constipation type- will continue Linzess since it is working well for her  Primary insomnia -     zolpidem (AMBIEN) 10 MG tablet; Take 0.5 tablets (5 mg total) by mouth at bedtime as needed for sleep.  Need for prophylactic vaccination against Streptococcus pneumoniae (pneumococcus) -     Pneumococcal polysaccharide vaccine 23-valent greater than or equal to 2yo subcutaneous/IM  Other specified hearing loss of both ears -     Ambulatory referral to Audiology   I have discontinued Ms. Mohs's polyethylene glycol, ibuprofen, and oxyCODONE-acetaminophen. I am also having her maintain her linaclotide, FLUoxetine, and zolpidem.  Meds ordered this encounter  Medications  . zolpidem (AMBIEN) 10 MG tablet    Sig: Take 0.5 tablets (5 mg total) by mouth at bedtime as needed for sleep.    Dispense:  30 tablet    Refill:  3     Follow-up: Return if symptoms worsen or fail to improve.  Sanda Linger, MD

## 2016-07-06 NOTE — Progress Notes (Signed)
Pre visit review using our clinic review tool, if applicable. No additional management support is needed unless otherwise documented below in the visit note. 

## 2016-08-08 IMAGING — CR DG CHEST 2V
2 series · 2 of 2 positions shown · non-contrast
Comparison: 06/24/2008

CLINICAL DATA: Hepatitis, hematuria, loss of consciousness

EXAM:
CHEST  2 VIEW

[w chest pa]
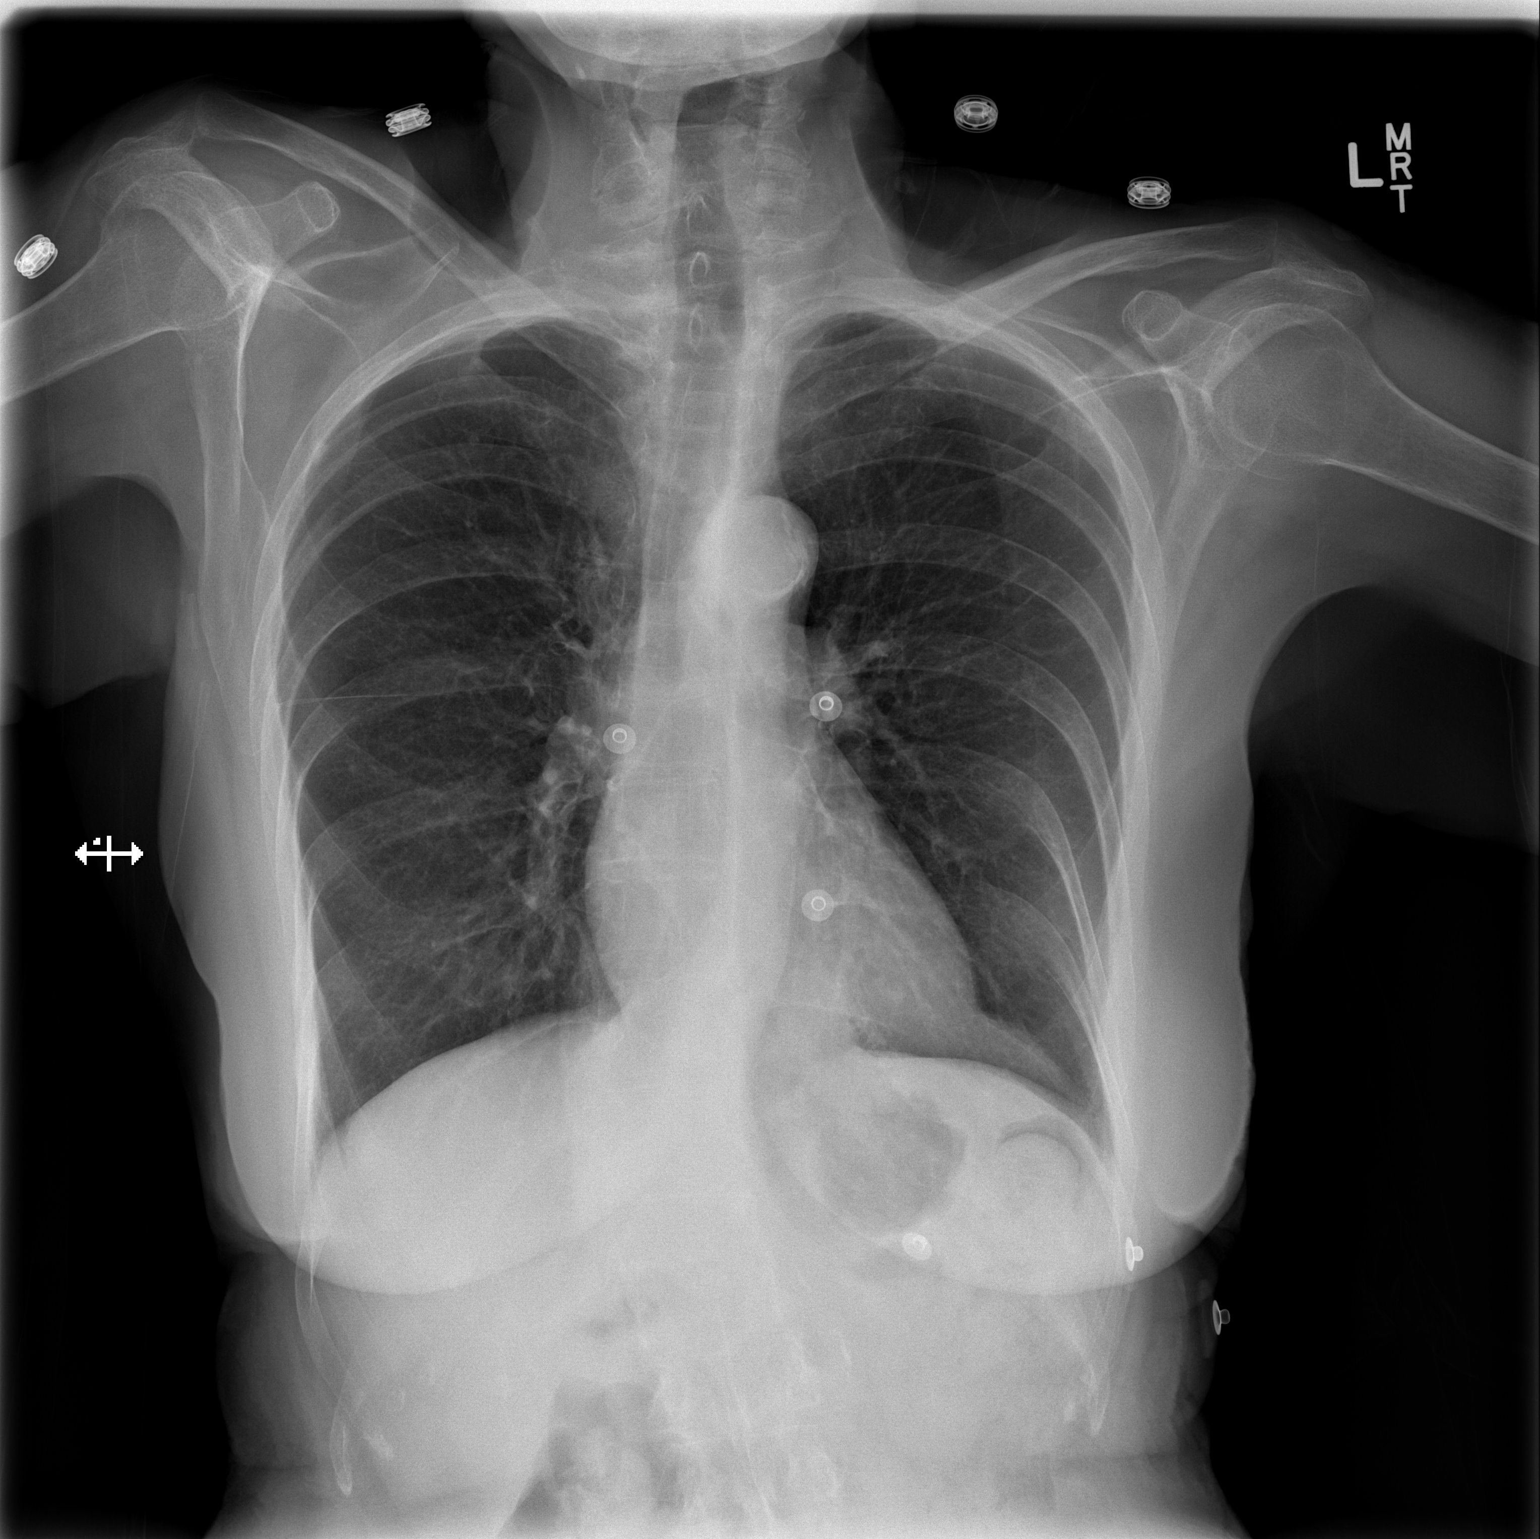

[w chest lat]
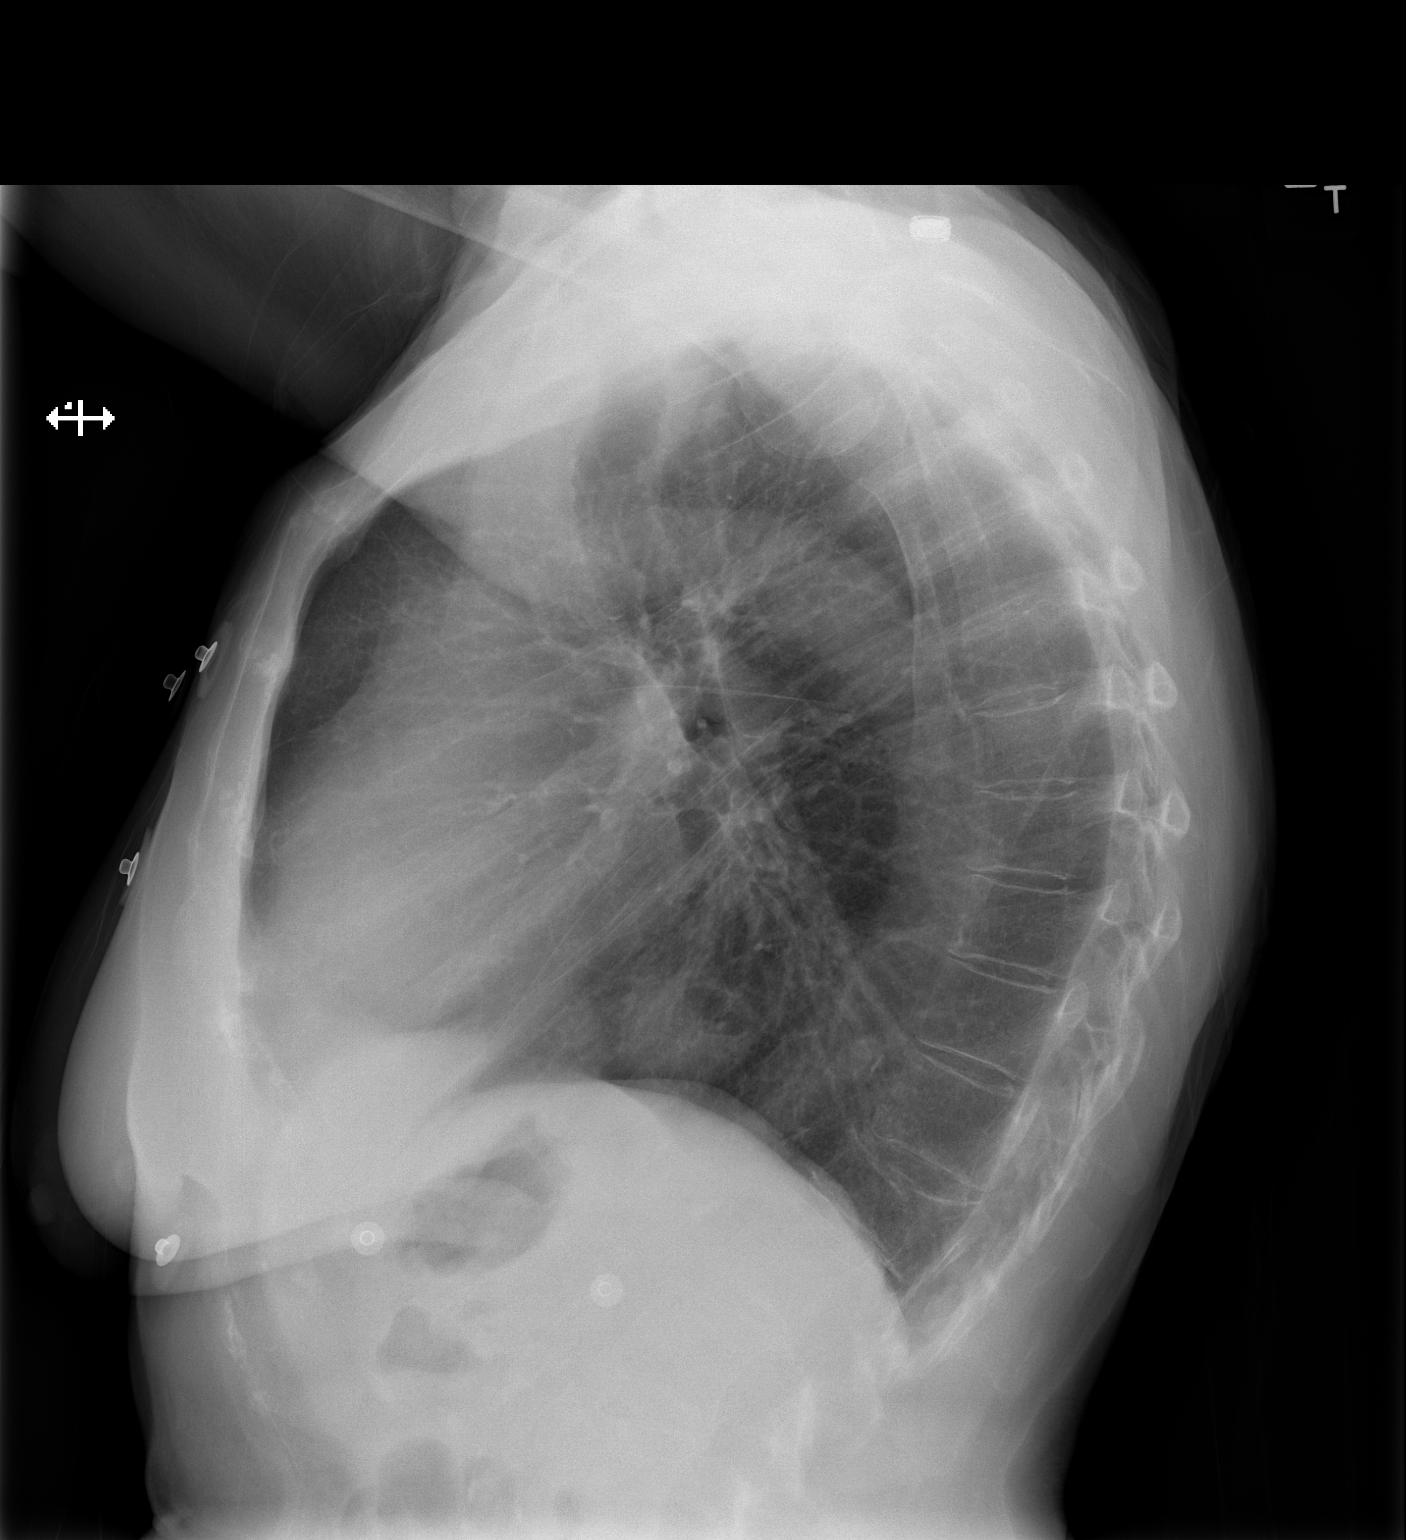

[2 of 2 positions shown; findings below may reference images not displayed]

FINDINGS: The heart size and mediastinal contours are within normal limits.
Both lungs are clear. The visualized skeletal structures are
unremarkable.
IMPRESSION: No active cardiopulmonary disease.

## 2016-08-08 IMAGING — CT CT HEAD W/O CM
2 series · 16 of 30 positions shown, 18 images · non-contrast
Comparison: CT of the head February 06, 2006

CLINICAL DATA: Syncopal episode today.

EXAM:
CT HEAD WITHOUT CONTRAST
TECHNIQUE: Contiguous axial images were obtained from the base of the skull
through the vertex without intravenous contrast.

[Series 201: head w/o, idose (1) · axial · non-contrast · 0.49mm/px · z∈[+613,+723]mm · 8 of 30 slices shown, 10 images]
[im 4/30  brain]
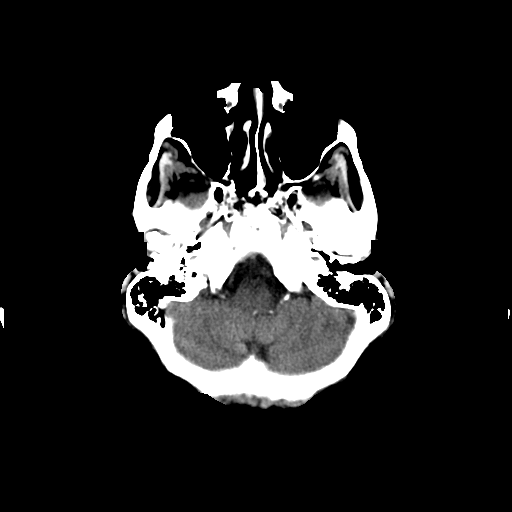
[im 4/30  bone]
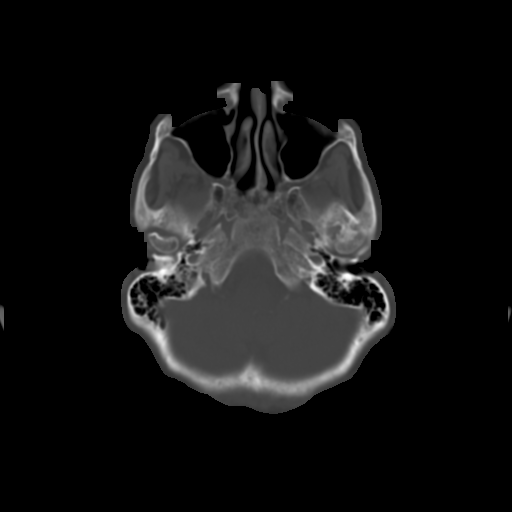
[im 7/30  brain]
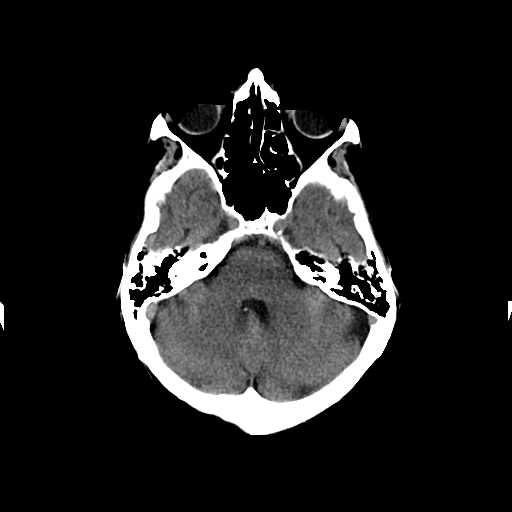
[im 10/30  brain]
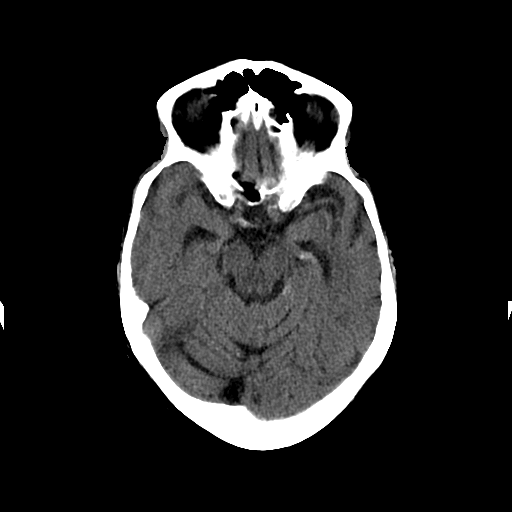
[im 13/30  brain]
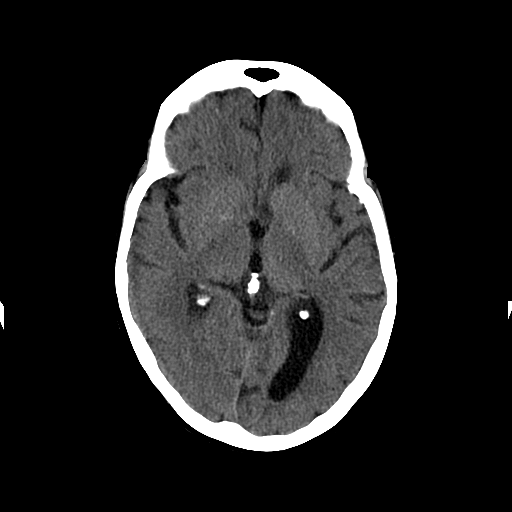
[im 17/30  brain]
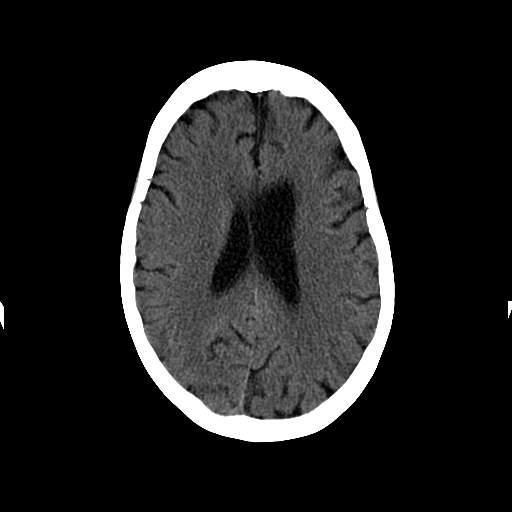
[im 17/30  bone]
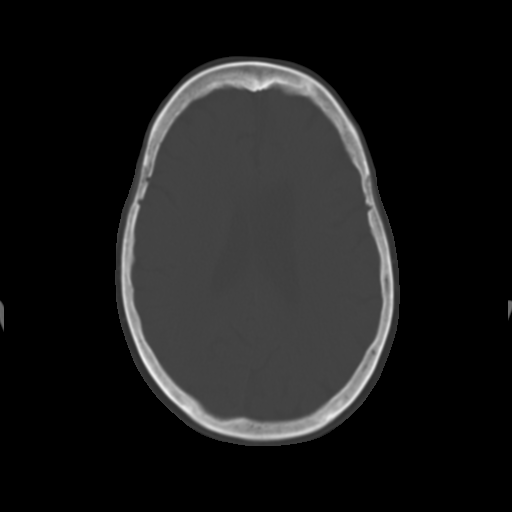
[im 20/30  brain]
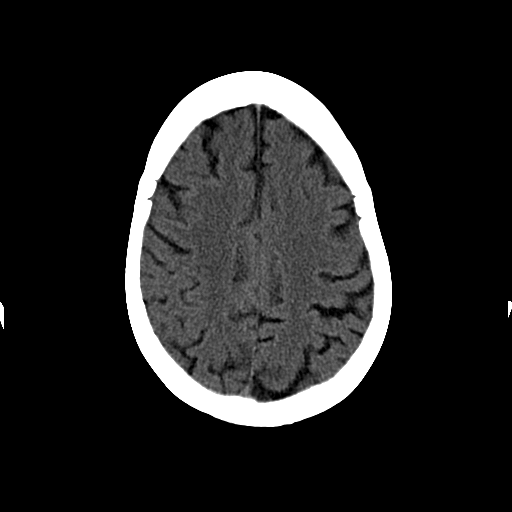
[im 23/30  brain]
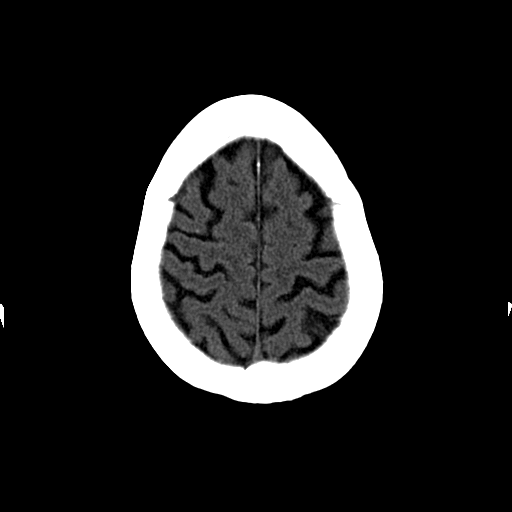
[im 26/30  brain]
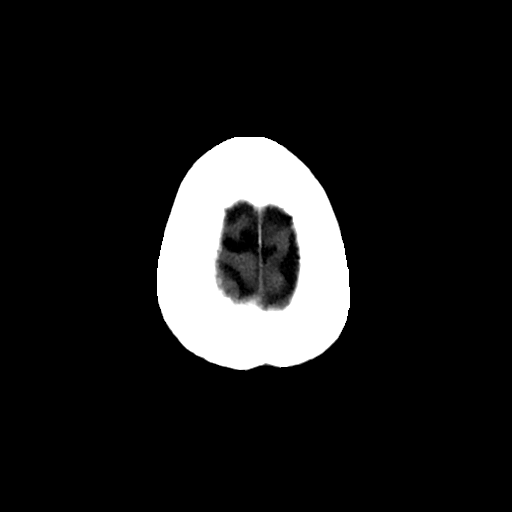

[Series 202: head w/o bone, idose (1) · axial · non-contrast · 0.49mm/px · z∈[+611,+726]mm · 8 of 60 slices shown]
[im 7/60  bone]
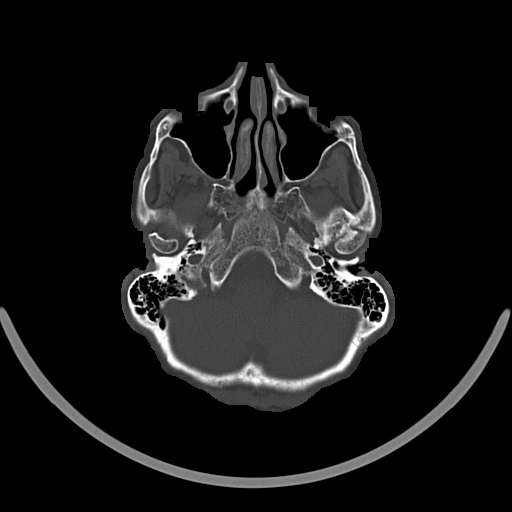
[im 13/60  bone]
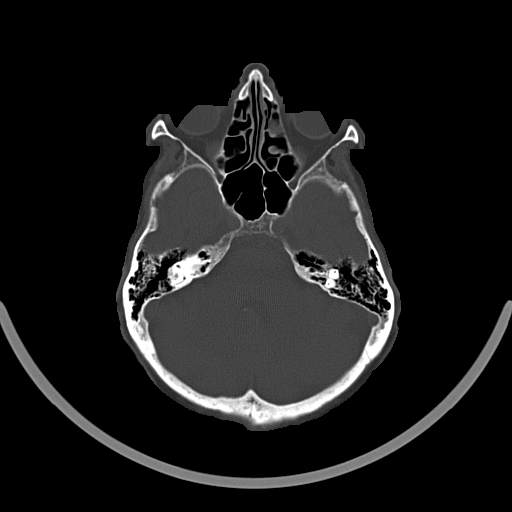
[im 19/60  bone]
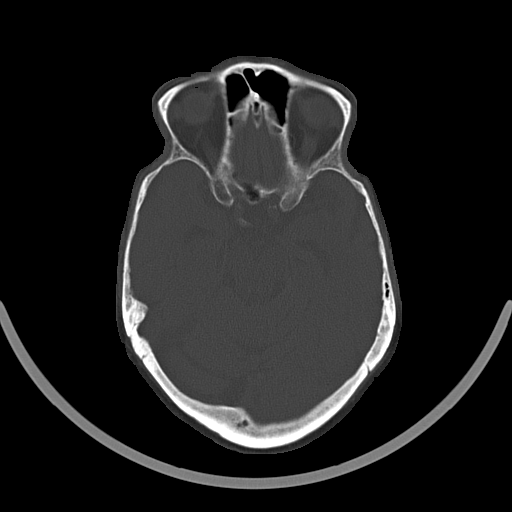
[im 25/60  bone]
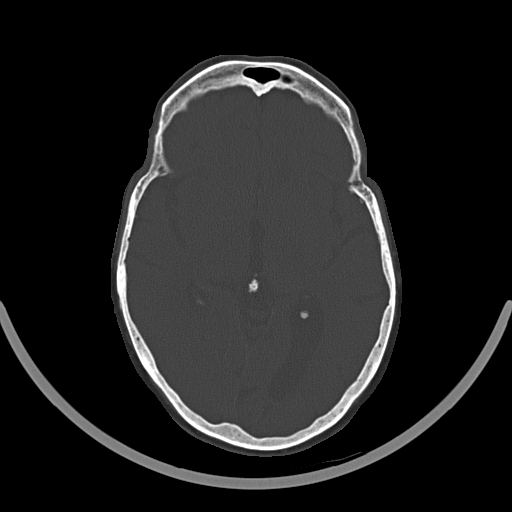
[im 35/60  bone]
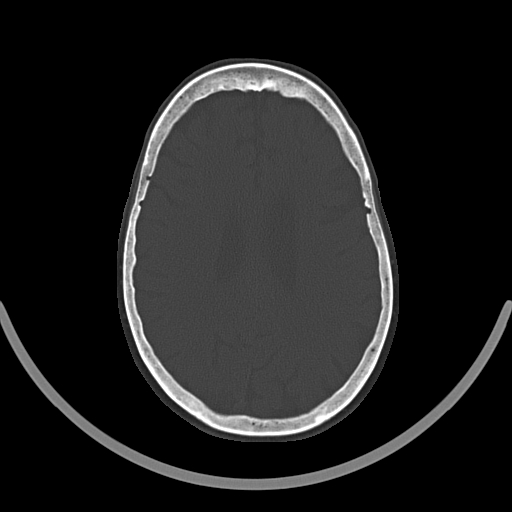
[im 41/60  bone]
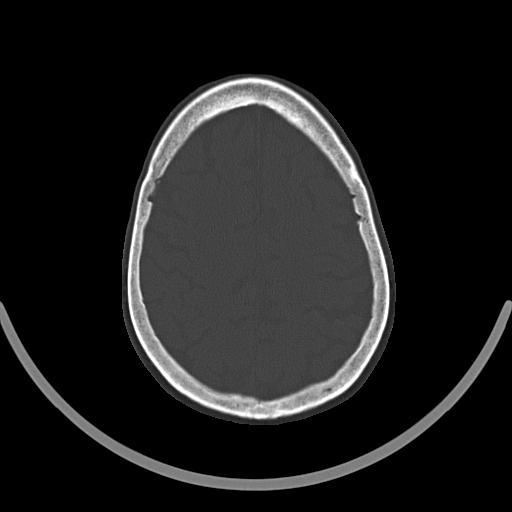
[im 47/60  bone]
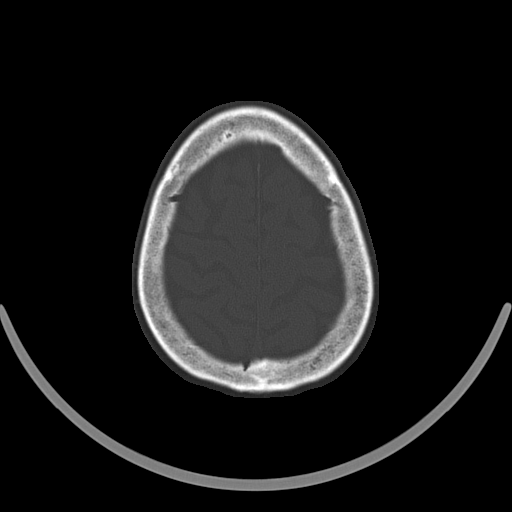
[im 53/60  bone]
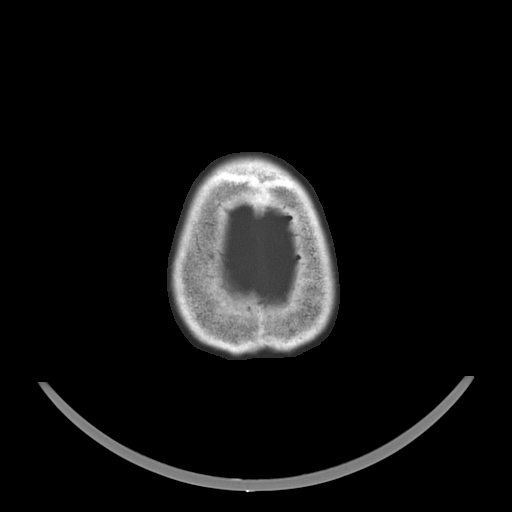

[16 of 30 positions shown; findings below may reference images not displayed]

FINDINGS: Effaced RIGHT frontal horn of the lateral ventricle, the septum
pellucidum is tented towards the RIGHT. Minimal subependymal
calcifications are unchanged. No hydrocephalus. No intraparenchymal
hemorrhage, mass effect nor midline shift. Patchy supratentorial
white matter hypodensities are within normal range for patient's age
and though non-specific suggest sequelae of chronic small vessel
ischemic disease. No acute large vascular territory infarcts.

No abnormal extra-axial fluid collections. Basal cisterns are
patent. Moderate calcific atherosclerosis of the carotid siphons.

No skull fracture. The included ocular globes and orbital contents
are non-suspicious. LEFT ocular lens implant. The mastoid aircells
and included paranasal sinuses are well-aerated.
IMPRESSION: No acute intracranial process.

Stable appearance of the head: Effaced RIGHT frontal horn of the
lateral ventricle with calcifications and suspected adhesions
suggests remote infectious or inflammatory process.

  By: Fatim Iv

## 2016-08-09 ENCOUNTER — Other Ambulatory Visit: Payer: Self-pay | Admitting: Internal Medicine

## 2016-08-09 DIAGNOSIS — K5900 Constipation, unspecified: Secondary | ICD-10-CM

## 2016-08-09 DIAGNOSIS — K589 Irritable bowel syndrome without diarrhea: Secondary | ICD-10-CM

## 2016-09-13 ENCOUNTER — Other Ambulatory Visit: Payer: Self-pay | Admitting: Internal Medicine

## 2016-09-13 DIAGNOSIS — F32A Depression, unspecified: Secondary | ICD-10-CM

## 2016-09-13 DIAGNOSIS — F450 Somatization disorder: Principal | ICD-10-CM

## 2016-09-13 DIAGNOSIS — F329 Major depressive disorder, single episode, unspecified: Secondary | ICD-10-CM

## 2016-11-18 ENCOUNTER — Other Ambulatory Visit (INDEPENDENT_AMBULATORY_CARE_PROVIDER_SITE_OTHER): Payer: Medicare Other

## 2016-11-18 ENCOUNTER — Encounter: Payer: Self-pay | Admitting: Internal Medicine

## 2016-11-18 ENCOUNTER — Ambulatory Visit (INDEPENDENT_AMBULATORY_CARE_PROVIDER_SITE_OTHER)
Admission: RE | Admit: 2016-11-18 | Discharge: 2016-11-18 | Disposition: A | Discharge status: Home / Self Care | Payer: Medicare Other | Source: Ambulatory Visit | Attending: Internal Medicine | Admitting: Internal Medicine

## 2016-11-18 ENCOUNTER — Ambulatory Visit (INDEPENDENT_AMBULATORY_CARE_PROVIDER_SITE_OTHER): Payer: Medicare Other | Admitting: Internal Medicine

## 2016-11-18 VITALS — BP 118/78 | HR 72 | Temp 97.7°F | Resp 16 | Ht 64.0 in | Wt 110.2 lb

## 2016-11-18 DIAGNOSIS — K5900 Constipation, unspecified: Secondary | ICD-10-CM

## 2016-11-18 DIAGNOSIS — N1831 Chronic kidney disease, stage 3a: Secondary | ICD-10-CM | POA: Insufficient documentation

## 2016-11-18 DIAGNOSIS — M545 Low back pain: Secondary | ICD-10-CM

## 2016-11-18 DIAGNOSIS — G8929 Other chronic pain: Secondary | ICD-10-CM | POA: Insufficient documentation

## 2016-11-18 DIAGNOSIS — N183 Chronic kidney disease, stage 3 unspecified: Secondary | ICD-10-CM

## 2016-11-18 DIAGNOSIS — F450 Somatization disorder: Secondary | ICD-10-CM | POA: Diagnosis not present

## 2016-11-18 DIAGNOSIS — R5383 Other fatigue: Secondary | ICD-10-CM | POA: Insufficient documentation

## 2016-11-18 DIAGNOSIS — F329 Major depressive disorder, single episode, unspecified: Secondary | ICD-10-CM | POA: Diagnosis not present

## 2016-11-18 DIAGNOSIS — M546 Pain in thoracic spine: Secondary | ICD-10-CM | POA: Diagnosis not present

## 2016-11-18 DIAGNOSIS — S3992XA Unspecified injury of lower back, initial encounter: Secondary | ICD-10-CM | POA: Diagnosis not present

## 2016-11-18 LAB — CBC WITH DIFFERENTIAL/PLATELET
BASOS PCT: 0.9 % (ref 0.0–3.0)
Basophils Absolute: 0.1 10*3/uL (ref 0.0–0.1)
EOS PCT: 1.3 % (ref 0.0–5.0)
Eosinophils Absolute: 0.1 10*3/uL (ref 0.0–0.7)
HCT: 37.8 % (ref 36.0–46.0)
Hemoglobin: 12.6 g/dL (ref 12.0–15.0)
Lymphocytes Relative: 16.4 % (ref 12.0–46.0)
Lymphs Abs: 1.1 10*3/uL (ref 0.7–4.0)
MCHC: 33.4 g/dL (ref 30.0–36.0)
MCV: 91.9 fl (ref 78.0–100.0)
MONO ABS: 0.7 10*3/uL (ref 0.1–1.0)
Monocytes Relative: 10.3 % (ref 3.0–12.0)
Neutro Abs: 4.6 10*3/uL (ref 1.4–7.7)
Neutrophils Relative %: 71.1 % (ref 43.0–77.0)
Platelets: 262 10*3/uL (ref 150.0–400.0)
RBC: 4.11 Mil/uL (ref 3.87–5.11)
RDW: 13.6 % (ref 11.5–15.5)
WBC: 6.5 10*3/uL (ref 4.0–10.5)

## 2016-11-18 LAB — COMPREHENSIVE METABOLIC PANEL
ALK PHOS: 79 U/L (ref 39–117)
ALT: 10 U/L (ref 0–35)
AST: 21 U/L (ref 0–37)
Albumin: 3.9 g/dL (ref 3.5–5.2)
BUN: 23 mg/dL (ref 6–23)
CO2: 24 mEq/L (ref 19–32)
Calcium: 9.6 mg/dL (ref 8.4–10.5)
Chloride: 107 mEq/L (ref 96–112)
Creatinine, Ser: 0.98 mg/dL (ref 0.40–1.20)
GFR: 56.66 mL/min — ABNORMAL LOW (ref >60.00)
GLUCOSE: 91 mg/dL (ref 70–99)
POTASSIUM: 4.1 meq/L (ref 3.5–5.1)
SODIUM: 138 meq/L (ref 135–145)
TOTAL PROTEIN: 7 g/dL (ref 6.0–8.3)
Total Bilirubin: 0.4 mg/dL (ref 0.2–1.2)

## 2016-11-18 LAB — URINALYSIS, ROUTINE W REFLEX MICROSCOPIC
BILIRUBIN URINE: NEGATIVE
KETONES UR: NEGATIVE
Leukocytes, UA: NEGATIVE
NITRITE: NEGATIVE
Specific Gravity, Urine: 1.01 (ref 1.000–1.030)
Total Protein, Urine: NEGATIVE
URINE GLUCOSE: NEGATIVE
Urobilinogen, UA: 0.2 (ref 0.0–1.0)
pH: 6 (ref 5.0–8.0)

## 2016-11-18 MED ORDER — BUPROPION HCL 75 MG PO TABS: 75.0000 mg | ORAL_TABLET | Freq: Two times a day (BID) | ORAL | 1 refills | Status: DC

## 2016-11-18 NOTE — Patient Instructions (Signed)

## 2016-11-18 NOTE — Progress Notes (Signed)
Subjective:  Patient ID: Evelyn [name], female    DOB: [birth year]  Age: 83 y.o. MRN: [id number]  CC: Fatigue and Constipation   HPI Evelyn [name] presents for f/up - She complains of intermittent mid and lower back pain for 2 months after a recent fall. She is getting adequate symptom relief with over-the-counter medications. She has a prior history of compression fracture in the T12 vertebrae. She also complains of chronic fatigue and constipation. She reports anhedonia, fatigue, sleeping too much, and wants to try adding bupropion to Prozac to see if it will help with the symptoms.  Outpatient Medications Prior to Visit  Medication Sig Dispense Refill  . FLUoxetine (PROZAC) 20 MG capsule TAKE 1 CAPSULE BY MOUTH DAILY 90 capsule 3  . LINZESS 290 MCG CAPS capsule TAKE 1 CAPSULE BY MOUTH DAILY 90 capsule 3  . zolpidem (AMBIEN) 10 MG tablet Take 0.5 tablets (5 mg total) by mouth at bedtime as needed for sleep. 30 tablet 3   No facility-administered medications prior to visit.     ROS Review of Systems  Constitutional: Positive for activity change and fatigue. Negative for appetite change and chills.  HENT: Negative.  Negative for trouble swallowing.   Eyes: Negative for visual disturbance.  Respiratory: Negative for cough, chest tightness, shortness of breath and wheezing.   Cardiovascular: Negative for chest pain, palpitations and leg swelling.  Gastrointestinal: Positive for constipation. Negative for abdominal pain, diarrhea, nausea and vomiting.  Endocrine: Negative.  Negative for cold intolerance and heat intolerance.  Genitourinary: Negative.  Negative for difficulty urinating.  Musculoskeletal: Positive for back pain. Negative for arthralgias, myalgias and neck pain.  Skin: Negative.   Allergic/Immunologic: Negative.   Neurological: Negative.  Negative for dizziness.  Hematological: Negative for adenopathy. Does not bruise/bleed easily.  Psychiatric/Behavioral: Positive for  dysphoric mood and sleep disturbance. Negative for behavioral problems, confusion, decreased concentration and suicidal ideas. The patient is not nervous/anxious.     Objective:  BP 118/78 (BP Location: Left Arm, Patient Position: Sitting, Cuff Size: Normal)   Pulse 72   Temp 97.7 F (36.5 C) (Oral)   Resp 16   Ht 5\' 4"  (1.626 m)   Wt 110 lb 4 oz (50 kg)   SpO2 98%   BMI 18.92 kg/m   BP Readings from Last 3 Encounters:  11/18/16 118/78  07/06/16 130/80  03/09/16 (!) 138/54    Wt Readings from Last 3 Encounters:  11/18/16 110 lb 4 oz (50 kg)  07/06/16 111 lb (50.3 kg)  03/09/16 110 lb (49.9 kg)    Physical Exam  Constitutional: She is oriented to person, place, and time. No distress.  HENT:  Mouth/Throat: Oropharynx is clear and moist. No oropharyngeal exudate.  Eyes: Conjunctivae are normal. No scleral icterus.  Neck: Normal range of motion. Neck supple. No thyromegaly present.  Cardiovascular: Normal rate and regular rhythm.   No murmur heard. Pulmonary/Chest: Effort normal and breath sounds normal. No respiratory distress. She has no wheezes. She has no rales. She exhibits no tenderness.  Abdominal: Soft. Bowel sounds are normal. She exhibits no distension and no mass. There is no tenderness. There is no rebound and no guarding.  Musculoskeletal: Normal range of motion. She exhibits no edema or deformity.       Thoracic back: She exhibits tenderness and bony tenderness. She exhibits no swelling, no edema and no deformity.       Lumbar back: Normal. She exhibits normal range of motion, no tenderness, no bony  tenderness, no swelling, no deformity, no pain and no spasm.  There is diffuse tenderness to palpation in the mid to lower thoracic spine  Lymphadenopathy:    She has no cervical adenopathy.  Neurological: She is alert and oriented to person, place, and time. She displays no atrophy and no tremor. No cranial nerve deficit or sensory deficit. She exhibits normal muscle  tone. She displays no seizure activity. Coordination and gait normal.  Neg SLR in BLE  Skin: Skin is warm and dry. No rash noted. She is not diaphoretic. No erythema. No pallor.  Vitals reviewed.   Lab Results  Component Value Date   WBC 6.5 11/18/2016   HGB 12.6 11/18/2016   HCT 37.8 11/18/2016   PLT 262.0 11/18/2016   GLUCOSE 91 11/18/2016   CHOL 193 12/11/2015   TRIG 95.0 12/11/2015   HDL 59.30 12/11/2015   LDLDIRECT 146.5 05/03/2013   LDLCALC 115 (H) 12/11/2015   ALT 10 11/18/2016   AST 21 11/18/2016   NA 138 11/18/2016   K 4.1 11/18/2016   CL 107 11/18/2016   CREATININE 0.98 11/18/2016   BUN 23 11/18/2016   CO2 24 11/18/2016   TSH 1.06 11/18/2016   INR 1.1 (H) 08/13/2010   HGBA1C 5.7 12/05/2014    No results found.  Assessment & Plan:   Evelyn [name] was seen today for fatigue and constipation.  Diagnoses and all orders for this visit:  Chronic midline low back pain, with sciatica presence unspecified- plain films confirm the prior T12 fracture and degenerative disc but there are no acute vertebral fractures. -     DG Lumbar Spine Complete; Future -     DG Thoracic Spine W/Swimmers; Future -     Urinalysis, Routine w reflex microscopic; Future  Constipation, unspecified constipation type- her labs are negative for any secondary causes, will continue Linzess at the current dose. -     Comprehensive metabolic panel; Future -     Thyroid Panel With TSH; Future  Fatigue, unspecified type- her labs are negative for any secondary organic causes. I think this is mostly related to the fact that she is 83 years of age and suffers from depression. -     Comprehensive metabolic panel; Future -     CBC with Differential/Platelet; Future -     Thyroid Panel With TSH; Future  Depression with somatization- will augment the Prozac with Wellbutrin to see if this improves her mood and energy level. -     buPROPion (WELLBUTRIN) 75 MG tablet; Take 1 tablet (75 mg total) by mouth 2 (two)  times daily.  Chronic renal disease, stage 3, moderately decreased glomerular filtration rate (GFR) between 30-59 mL/min/1.73 square meter- her creatinine clearance is down to 30, I've asked her to avoid nephrotoxic agents   I am having Evelyn [name] start on buPROPion. I am also having her maintain her zolpidem, LINZESS, and FLUoxetine.  Meds ordered this encounter  Medications  . buPROPion (WELLBUTRIN) 75 MG tablet    Sig: Take 1 tablet (75 mg total) by mouth 2 (two) times daily.    Dispense:  180 tablet    Refill:  1     Follow-up: Return in about 4 weeks (around 12/16/2016).  Sanda Lingerhomas Jones, MD

## 2016-11-19 ENCOUNTER — Telehealth: Payer: Self-pay | Admitting: Internal Medicine

## 2016-11-19 LAB — THYROID PANEL WITH TSH
Free Thyroxine Index: 2.7 (ref 1.4–3.8)
T3 Uptake: 32 % (ref 22–35)
T4 TOTAL: 8.3 ug/dL (ref 4.5–12.0)
TSH: 1.06 mIU/L

## 2016-11-19 NOTE — Telephone Encounter (Signed)
Patients son called in stating that patient is waiting by the phone today for results on x ray.  I will notify patient that results may be Monday.

## 2016-11-22 NOTE — Telephone Encounter (Signed)
Pt filled out a recent DPR. Son is not on the list. I will not contact. Pt and dtr was given the bone density results via printed copy at time of last visit.

## 2016-11-24 ENCOUNTER — Ambulatory Visit (INDEPENDENT_AMBULATORY_CARE_PROVIDER_SITE_OTHER): Payer: Medicare Other | Admitting: Internal Medicine

## 2016-11-24 ENCOUNTER — Encounter: Payer: Self-pay | Admitting: Internal Medicine

## 2016-11-24 ENCOUNTER — Telehealth: Payer: Self-pay | Admitting: Internal Medicine

## 2016-11-24 VITALS — BP 128/78 | HR 70 | Temp 97.7°F | Resp 16 | Ht 64.0 in | Wt 109.8 lb

## 2016-11-24 DIAGNOSIS — K5900 Constipation, unspecified: Secondary | ICD-10-CM | POA: Diagnosis not present

## 2016-11-24 DIAGNOSIS — S22080G Wedge compression fracture of T11-T12 vertebra, subsequent encounter for fracture with delayed healing: Secondary | ICD-10-CM

## 2016-11-24 DIAGNOSIS — N183 Chronic kidney disease, stage 3 unspecified: Secondary | ICD-10-CM

## 2016-11-24 DIAGNOSIS — S22080A Wedge compression fracture of T11-T12 vertebra, initial encounter for closed fracture: Secondary | ICD-10-CM | POA: Insufficient documentation

## 2016-11-24 NOTE — Telephone Encounter (Signed)
Patient has appt scheduled for today.  Dr. Yetta BarreJones will review results with her.

## 2016-11-24 NOTE — Progress Notes (Signed)
Subjective:  Patient ID: Evelyn [name], female    DOB: [birth year]  Age: 83 y.o. MRN: [id number]  CC: Back Pain and Constipation   HPI Evelyn [name] presents for f/up and to discuss her recent labs and xrays.She admits to intermittently taking Aleve and ibuprofen for pain. She is also suffering from chronic back pain and recent plain films showed an old, unchanged T12 vertebral fracture and degenerative disc disease. There were no acute findings. She continues to struggle with constipation but gets some symptom relief with linzess. She wants to know if it's okay to occasionally at a dose of MiraLAX.  Outpatient Medications Prior to Visit  Medication Sig Dispense Refill  . buPROPion (WELLBUTRIN) 75 MG tablet Take 1 tablet (75 mg total) by mouth 2 (two) times daily. 180 tablet 1  . FLUoxetine (PROZAC) 20 MG capsule TAKE 1 CAPSULE BY MOUTH DAILY 90 capsule 3  . LINZESS 290 MCG CAPS capsule TAKE 1 CAPSULE BY MOUTH DAILY 90 capsule 3  . zolpidem (AMBIEN) 10 MG tablet Take 0.5 tablets (5 mg total) by mouth at bedtime as needed for sleep. 30 tablet 3   No facility-administered medications prior to visit.     ROS Review of Systems  Constitutional: Negative.  Negative for appetite change, diaphoresis, fatigue and unexpected weight change.  HENT: Negative.   Eyes: Negative.  Negative for visual disturbance.  Respiratory: Negative.  Negative for cough, chest tightness, shortness of breath and wheezing.   Cardiovascular: Negative for chest pain, palpitations and leg swelling.  Gastrointestinal: Positive for constipation. Negative for abdominal pain, anal bleeding, diarrhea, nausea and vomiting.  Genitourinary: Negative.  Negative for decreased urine volume, difficulty urinating, dysuria, frequency and urgency.  Musculoskeletal: Positive for back pain. Negative for myalgias.  Allergic/Immunologic: Negative.   Neurological: Negative.  Negative for dizziness and weakness.  Hematological: Negative  for adenopathy. Does not bruise/bleed easily.  Psychiatric/Behavioral: Negative.     Objective:  BP 128/78 (BP Location: Left Arm, Patient Position: Sitting, Cuff Size: Normal)   Pulse 70   Temp 97.7 F (36.5 C) (Oral)   Resp 16   Ht 5\' 4"  (1.626 m)   Wt 109 lb 12 oz (49.8 kg)   SpO2 99%   BMI 18.84 kg/m   BP Readings from Last 3 Encounters:  11/24/16 128/78  11/18/16 118/78  07/06/16 130/80    Wt Readings from Last 3 Encounters:  11/24/16 109 lb 12 oz (49.8 kg)  11/18/16 110 lb 4 oz (50 kg)  07/06/16 111 lb (50.3 kg)    Physical Exam  Constitutional: She is oriented to person, place, and time. No distress.  HENT:  Mouth/Throat: Oropharynx is clear and moist. No oropharyngeal exudate.  Eyes: Conjunctivae are normal. Right eye exhibits no discharge. Left eye exhibits no discharge. No scleral icterus.  Neck: Normal range of motion. Neck supple. No JVD present. No tracheal deviation present. No thyromegaly present.  Cardiovascular: Normal rate and regular rhythm.  Exam reveals no gallop.   No murmur heard. Pulmonary/Chest: Effort normal and breath sounds normal. No respiratory distress. She has no wheezes. She has no rales.  Abdominal: Soft. Bowel sounds are normal. She exhibits no distension and no mass. There is no tenderness. There is no guarding.  Musculoskeletal: Normal range of motion. She exhibits no edema, tenderness or deformity.  Neurological: She is alert and oriented to person, place, and time.  Skin: Skin is warm and dry. No rash noted. She is not diaphoretic. No erythema. No pallor.  Vitals reviewed.   Lab Results  Component Value Date   WBC 6.5 11/18/2016   HGB 12.6 11/18/2016   HCT 37.8 11/18/2016   PLT 262.0 11/18/2016   GLUCOSE 91 11/18/2016   CHOL 193 12/11/2015   TRIG 95.0 12/11/2015   HDL 59.30 12/11/2015   LDLDIRECT 146.5 05/03/2013   LDLCALC 115 (H) 12/11/2015   ALT 10 11/18/2016   AST 21 11/18/2016   NA 138 11/18/2016   K 4.1 11/18/2016     CL 107 11/18/2016   CREATININE 0.98 11/18/2016   BUN 23 11/18/2016   CO2 24 11/18/2016   TSH 1.06 11/18/2016   INR 1.1 (H) 08/13/2010   HGBA1C 5.7 12/05/2014    Dg Thoracic Spine W/swimmers  Result Date: 11/19/2016 CLINICAL DATA:  Upper back pain after fall 2 months ago. EXAM: THORACIC SPINE - 3 VIEWS COMPARISON:  Radiographs of April 01, 2014. FINDINGS: Stable old compression fracture is seen involving lower thoracic vertebral body. No acute fracture or spondylolisthesis is noted. Hiatal hernia is noted. Atherosclerosis of thoracic aorta is noted. Disc spaces appear well maintained. IMPRESSION: Aortic atherosclerosis. Hiatal hernia. Stable old compression fracture of lower thoracic spine. No acute abnormality seen in the thoracic spine. Electronically Signed   By: Lupita RaiderJames  Green Jr, M.D.   On: 11/19/2016 08:08   Dg Lumbar Spine Complete  Result Date: 11/19/2016 CLINICAL DATA:  Chronic midline low back pain after fall 2 months ago. EXAM: LUMBAR SPINE - COMPLETE 4+ VIEW COMPARISON:  None. FINDINGS: No acute fracture or spondylolisthesis is noted. Old T12 fracture is again noted. Atherosclerosis of thoracic aorta is noted. Moderate degenerative disc disease is noted at L3-4, L4-5 and L5-S1. Degenerative changes seen involving the posterior facet joints of L4-5 and L5-S1. IMPRESSION: Aortic atherosclerosis. Stable T12 compression fracture. Multilevel degenerative disc disease. No acute abnormality seen in the lumbar spine. Electronically Signed   By: Lupita RaiderJames  Green Jr, M.D.   On: 11/19/2016 08:10    Assessment & Plan:   Evelyn [name] was seen today for back pain and constipation.  Diagnoses and all orders for this visit:  Constipation, unspecified constipation type- will continue Linzess and I have given her permission to take a dose of MiraLAX as needed.  Chronic renal disease, stage 3, moderately decreased glomerular filtration rate (GFR) between 30-59 mL/min/1.73 square meter- at the age of 83 I  don't think it would be beneficial for her to see nephrology as I don't think there is any identifiable or reversal causes for renal dysfunction other than her age and taking anti-inflammatories. She agrees to stop taking NSAIDs and to avoid nephrotoxic agents.  Closed wedge compression fracture of twelfth thoracic vertebra with delayed healing, subsequent encounter- she is comfortable taking Tylenol as needed for symptom relief.   I am having Ms. Rice maintain her zolpidem, LINZESS, FLUoxetine, and buPROPion.  No orders of the defined types were placed in this encounter.    Follow-up: Return in about 6 weeks (around 01/05/2017).  Sanda Lingerhomas Jarrah Seher, MD

## 2016-11-24 NOTE — Patient Instructions (Signed)

## 2016-11-24 NOTE — Telephone Encounter (Signed)
Pt came to visit today with her son.  Pt & son stopped by front desk on their way out & son requested a f/u visit in 6 weeks to see how meds are working.  I made an appt for July and made it for 30 minutes.

## 2016-11-25 ENCOUNTER — Telehealth: Payer: Self-pay | Admitting: Internal Medicine

## 2016-11-25 NOTE — Telephone Encounter (Signed)
Called pt dtr Misty StanleyLisa Hipp (on DPR) to confirm that she is aware of the appt.

## 2016-11-26 NOTE — Telephone Encounter (Signed)
Pt son called and Evelyn [name] offered his mother tramadol for her pain when she was seen a couplel days ago , she denied wanting it but she has changed her mind and and would like it sent in. Would like a low dosage. Please advise.

## 2016-11-29 ENCOUNTER — Other Ambulatory Visit: Payer: Self-pay | Admitting: Internal Medicine

## 2016-11-29 DIAGNOSIS — G8929 Other chronic pain: Secondary | ICD-10-CM

## 2016-11-29 DIAGNOSIS — M545 Low back pain, unspecified: Secondary | ICD-10-CM

## 2016-11-29 DIAGNOSIS — S22080G Wedge compression fracture of T11-T12 vertebra, subsequent encounter for fracture with delayed healing: Secondary | ICD-10-CM

## 2016-11-29 MED ORDER — TRAMADOL HCL 50 MG PO TABS: 50.0000 mg | ORAL_TABLET | Freq: Two times a day (BID) | ORAL | 5 refills | Status: DC | PRN

## 2016-11-29 NOTE — Telephone Encounter (Signed)
RX written 

## 2016-11-29 NOTE — Telephone Encounter (Signed)
Called pt inform MD rx tramadol needing to verify pharmacy. Pt want rx sent to [facility]. Inform pt faxing script to [facility]...Raechel Chute/lmb

## 2016-12-20 DIAGNOSIS — H6503 Acute serous otitis media, bilateral: Secondary | ICD-10-CM | POA: Diagnosis not present

## 2016-12-29 DIAGNOSIS — H903 Sensorineural hearing loss, bilateral: Secondary | ICD-10-CM | POA: Diagnosis not present

## 2016-12-29 DIAGNOSIS — R42 Dizziness and giddiness: Secondary | ICD-10-CM | POA: Diagnosis not present

## 2017-01-06 ENCOUNTER — Encounter: Payer: Self-pay | Admitting: Internal Medicine

## 2017-01-06 ENCOUNTER — Ambulatory Visit (INDEPENDENT_AMBULATORY_CARE_PROVIDER_SITE_OTHER): Payer: Medicare Other | Admitting: Internal Medicine

## 2017-01-06 VITALS — BP 116/80 | HR 77 | Temp 98.0°F | Ht 64.0 in | Wt 108.2 lb

## 2017-01-06 DIAGNOSIS — M5440 Lumbago with sciatica, unspecified side: Secondary | ICD-10-CM

## 2017-01-06 DIAGNOSIS — K5909 Other constipation: Secondary | ICD-10-CM

## 2017-01-06 DIAGNOSIS — F5101 Primary insomnia: Secondary | ICD-10-CM | POA: Diagnosis not present

## 2017-01-06 DIAGNOSIS — G8929 Other chronic pain: Secondary | ICD-10-CM

## 2017-01-06 MED ORDER — ZOLPIDEM TARTRATE 10 MG PO TABS: 5.0000 mg | ORAL_TABLET | Freq: Every evening | ORAL | 3 refills | Status: DC | PRN

## 2017-01-06 NOTE — Patient Instructions (Signed)

## 2017-01-06 NOTE — Progress Notes (Signed)
Subjective:  Patient ID: Evelyn [name], female    DOB: December 12, 1924  Age: 83 y.o. MRN: [id number]  CC: Back Pain   HPI Evelyn [name] presents for f/up - In general she feels better with less low back pain and better energy. Unfortunately she has lost another 2 pounds. She tells me that food is not interesting to her. She has mild constipation but no abdominal pain.  Outpatient Medications Prior to Visit  Medication Sig Dispense Refill  . buPROPion (WELLBUTRIN) 75 MG tablet Take 1 tablet (75 mg total) by mouth 2 (two) times daily. 180 tablet 1  . FLUoxetine (PROZAC) 20 MG capsule TAKE 1 CAPSULE BY MOUTH DAILY 90 capsule 3  . LINZESS 290 MCG CAPS capsule TAKE 1 CAPSULE BY MOUTH DAILY 90 capsule 3  . traMADol (ULTRAM) 50 MG tablet Take 1 tablet (50 mg total) by mouth every 12 (twelve) hours as needed for severe pain. 60 tablet 5  . zolpidem (AMBIEN) 10 MG tablet Take 0.5 tablets (5 mg total) by mouth at bedtime as needed for sleep. 30 tablet 3   No facility-administered medications prior to visit.     ROS Review of Systems  Constitutional: Positive for unexpected weight change. Negative for activity change, appetite change, diaphoresis and fatigue.  HENT: Negative.   Eyes: Negative.   Respiratory: Negative.  Negative for cough, chest tightness, shortness of breath and wheezing.   Cardiovascular: Negative for chest pain, palpitations and leg swelling.  Gastrointestinal: Positive for constipation. Negative for abdominal pain, diarrhea, nausea and vomiting.  Endocrine: Negative.   Genitourinary: Negative.  Negative for decreased urine volume, difficulty urinating, dysuria, enuresis and urgency.  Musculoskeletal: Positive for back pain. Negative for myalgias.  Skin: Negative.   Allergic/Immunologic: Negative.   Neurological: Negative.   Hematological: Negative for adenopathy. Does not bruise/bleed easily.  Psychiatric/Behavioral: Negative.     Objective:  BP 116/80 (BP Location:  Left Arm, Patient Position: Sitting, Cuff Size: Normal)   Pulse 77   Temp 98 F (36.7 C) (Oral)   Ht 5\' 4"  (1.626 m)   Wt 108 lb 4 oz (49.1 kg)   SpO2 98%   BMI 18.58 kg/m   BP Readings from Last 3 Encounters:  01/06/17 116/80  11/24/16 128/78  11/18/16 118/78    Wt Readings from Last 3 Encounters:  01/06/17 108 lb 4 oz (49.1 kg)  11/24/16 109 lb 12 oz (49.8 kg)  11/18/16 110 lb 4 oz (50 kg)    Physical Exam  Constitutional: She is oriented to person, place, and time. No distress.  HENT:  Mouth/Throat: Oropharynx is clear and moist. No oropharyngeal exudate.  Eyes: Conjunctivae are normal. Right eye exhibits no discharge. Left eye exhibits no discharge. No scleral icterus.  Neck: Normal range of motion. Neck supple. No JVD present. No thyromegaly present.  Cardiovascular: Normal rate, regular rhythm and intact distal pulses.  Exam reveals no gallop.   No murmur heard. Pulmonary/Chest: Effort normal and breath sounds normal. No respiratory distress. She has no wheezes. She has no rales. She exhibits no tenderness.  Abdominal: Soft. Bowel sounds are normal. She exhibits no distension and no mass. There is no tenderness. There is no rebound and no guarding.  Musculoskeletal: Normal range of motion. She exhibits no edema, tenderness or deformity.  Lymphadenopathy:    She has no cervical adenopathy.  Neurological: She is alert and oriented to person, place, and time.  Skin: Skin is warm and dry. No rash noted. She is not  diaphoretic. No erythema. No pallor.  Vitals reviewed.   Lab Results  Component Value Date   WBC 6.5 11/18/2016   HGB 12.6 11/18/2016   HCT 37.8 11/18/2016   PLT 262.0 11/18/2016   GLUCOSE 91 11/18/2016   CHOL 193 12/11/2015   TRIG 95.0 12/11/2015   HDL 59.30 12/11/2015   LDLDIRECT 146.5 05/03/2013   LDLCALC 115 (H) 12/11/2015   ALT 10 11/18/2016   AST 21 11/18/2016   NA 138 11/18/2016   K 4.1 11/18/2016   CL 107 11/18/2016   CREATININE 0.98  11/18/2016   BUN 23 11/18/2016   CO2 24 11/18/2016   TSH 1.06 11/18/2016   INR 1.1 (H) 08/13/2010   HGBA1C 5.7 12/05/2014    Dg Thoracic Spine W/swimmers  Result Date: 11/19/2016 CLINICAL DATA:  Upper back pain after fall 2 months ago. EXAM: THORACIC SPINE - 3 VIEWS COMPARISON:  Radiographs of April 01, 2014. FINDINGS: Stable old compression fracture is seen involving lower thoracic vertebral body. No acute fracture or spondylolisthesis is noted. Hiatal hernia is noted. Atherosclerosis of thoracic aorta is noted. Disc spaces appear well maintained. IMPRESSION: Aortic atherosclerosis. Hiatal hernia. Stable old compression fracture of lower thoracic spine. No acute abnormality seen in the thoracic spine. Electronically Signed   By: Lupita RaiderJames  Green Jr, M.D.   On: 11/19/2016 08:08   Dg Lumbar Spine Complete  Result Date: 11/19/2016 CLINICAL DATA:  Chronic midline low back pain after fall 2 months ago. EXAM: LUMBAR SPINE - COMPLETE 4+ VIEW COMPARISON:  None. FINDINGS: No acute fracture or spondylolisthesis is noted. Old T12 fracture is again noted. Atherosclerosis of thoracic aorta is noted. Moderate degenerative disc disease is noted at L3-4, L4-5 and L5-S1. Degenerative changes seen involving the posterior facet joints of L4-5 and L5-S1. IMPRESSION: Aortic atherosclerosis. Stable T12 compression fracture. Multilevel degenerative disc disease. No acute abnormality seen in the lumbar spine. Electronically Signed   By: Lupita RaiderJames  Green Jr, M.D.   On: 11/19/2016 08:10    Assessment & Plan:   Evelyn [name] was seen today for back pain.  Diagnoses and all orders for this visit:  Chronic midline low back pain with sciatica, sciatica laterality unspecified- will cont tramadol as needed  Primary insomnia- will cont ambien as needed -     zolpidem (AMBIEN) 10 MG tablet; Take 0.5 tablets (5 mg total) by mouth at bedtime as needed for sleep.  Other constipation- she has had a good response to Linzess, will  continue   I am having Evelyn [name] maintain her LINZESS, FLUoxetine, buPROPion, traMADol, and zolpidem.  Meds ordered this encounter  Medications  . zolpidem (AMBIEN) 10 MG tablet    Sig: Take 0.5 tablets (5 mg total) by mouth at bedtime as needed for sleep.    Dispense:  30 tablet    Refill:  3     Follow-up: Return in about 6 months (around 07/09/2017).  Sanda Lingerhomas Jones, MD

## 2017-01-28 DIAGNOSIS — H6502 Acute serous otitis media, left ear: Secondary | ICD-10-CM | POA: Diagnosis not present

## 2017-01-28 DIAGNOSIS — N390 Urinary tract infection, site not specified: Secondary | ICD-10-CM | POA: Diagnosis not present

## 2017-02-01 ENCOUNTER — Encounter: Payer: Self-pay | Admitting: Internal Medicine

## 2017-02-01 ENCOUNTER — Other Ambulatory Visit (INDEPENDENT_AMBULATORY_CARE_PROVIDER_SITE_OTHER): Payer: Medicare Other

## 2017-02-01 ENCOUNTER — Ambulatory Visit (INDEPENDENT_AMBULATORY_CARE_PROVIDER_SITE_OTHER): Payer: Medicare Other | Admitting: Internal Medicine

## 2017-02-01 ENCOUNTER — Telehealth: Payer: Self-pay | Admitting: Internal Medicine

## 2017-02-01 VITALS — BP 118/74 | HR 72 | Temp 97.6°F | Resp 16 | Ht 64.0 in | Wt 104.0 lb

## 2017-02-01 DIAGNOSIS — E785 Hyperlipidemia, unspecified: Secondary | ICD-10-CM | POA: Diagnosis not present

## 2017-02-01 DIAGNOSIS — N183 Chronic kidney disease, stage 3 unspecified: Secondary | ICD-10-CM

## 2017-02-01 DIAGNOSIS — R300 Dysuria: Secondary | ICD-10-CM

## 2017-02-01 DIAGNOSIS — K580 Irritable bowel syndrome with diarrhea: Secondary | ICD-10-CM

## 2017-02-01 DIAGNOSIS — H6983 Other specified disorders of Eustachian tube, bilateral: Secondary | ICD-10-CM | POA: Diagnosis not present

## 2017-02-01 DIAGNOSIS — R51 Headache: Secondary | ICD-10-CM

## 2017-02-01 DIAGNOSIS — R519 Headache, unspecified: Secondary | ICD-10-CM

## 2017-02-01 DIAGNOSIS — F329 Major depressive disorder, single episode, unspecified: Secondary | ICD-10-CM

## 2017-02-01 DIAGNOSIS — F32A Depression, unspecified: Secondary | ICD-10-CM

## 2017-02-01 DIAGNOSIS — F450 Somatization disorder: Secondary | ICD-10-CM | POA: Diagnosis not present

## 2017-02-01 LAB — CBC WITH DIFFERENTIAL/PLATELET
BASOS ABS: 0.1 10*3/uL (ref 0.0–0.1)
Basophils Relative: 0.9 % (ref 0.0–3.0)
EOS ABS: 0.1 10*3/uL (ref 0.0–0.7)
Eosinophils Relative: 1.3 % (ref 0.0–5.0)
HCT: 42.7 % (ref 36.0–46.0)
Hemoglobin: 13.8 g/dL (ref 12.0–15.0)
LYMPHS ABS: 1 10*3/uL (ref 0.7–4.0)
Lymphocytes Relative: 17.4 % (ref 12.0–46.0)
MCHC: 32.3 g/dL (ref 30.0–36.0)
MCV: 96.7 fl (ref 78.0–100.0)
Monocytes Absolute: 0.7 10*3/uL (ref 0.1–1.0)
Monocytes Relative: 11.5 % (ref 3.0–12.0)
NEUTROS ABS: 4.1 10*3/uL (ref 1.4–7.7)
NEUTROS PCT: 68.9 % (ref 43.0–77.0)
Platelets: 257 10*3/uL (ref 150.0–400.0)
RBC: 4.41 Mil/uL (ref 3.87–5.11)
RDW: 14.1 % (ref 11.5–15.5)
WBC: 6 10*3/uL (ref 4.0–10.5)

## 2017-02-01 LAB — COMPREHENSIVE METABOLIC PANEL
ALT: 10 U/L (ref 0–35)
AST: 23 U/L (ref 0–37)
Albumin: 4.5 g/dL (ref 3.5–5.2)
Alkaline Phosphatase: 70 U/L (ref 39–117)
BILIRUBIN TOTAL: 0.5 mg/dL (ref 0.2–1.2)
BUN: 26 mg/dL — ABNORMAL HIGH (ref 6–23)
CO2: 23 meq/L (ref 19–32)
CREATININE: 1.11 mg/dL (ref 0.40–1.20)
Calcium: 9.9 mg/dL (ref 8.4–10.5)
Chloride: 104 mEq/L (ref 96–112)
GFR: 49.05 mL/min — ABNORMAL LOW (ref >60.00)
GLUCOSE: 96 mg/dL (ref 70–99)
Potassium: 5.3 mEq/L — ABNORMAL HIGH (ref 3.5–5.1)
Sodium: 137 mEq/L (ref 135–145)
Total Protein: 7.2 g/dL (ref 6.0–8.3)

## 2017-02-01 LAB — URINALYSIS, ROUTINE W REFLEX MICROSCOPIC
BILIRUBIN URINE: NEGATIVE
HGB URINE DIPSTICK: NEGATIVE
Leukocytes, UA: NEGATIVE
Nitrite: NEGATIVE
RBC / HPF: NONE SEEN (ref 0–?)
Specific Gravity, Urine: 1.02 (ref 1.000–1.030)
Urine Glucose: NEGATIVE
Urobilinogen, UA: 1 (ref 0.0–1.0)
pH: 6 (ref 5.0–8.0)

## 2017-02-01 LAB — C-REACTIVE PROTEIN: CRP: 0.2 mg/dL — ABNORMAL LOW (ref 0.5–20.0)

## 2017-02-01 LAB — SEDIMENTATION RATE: Sed Rate: 18 mm/hr (ref 0–30)

## 2017-02-01 MED ORDER — DULOXETINE HCL 30 MG PO CPEP: 30.0000 mg | ORAL_CAPSULE | Freq: Every day | ORAL | 1 refills | Status: DC

## 2017-02-01 MED ORDER — LEVOCETIRIZINE DIHYDROCHLORIDE 5 MG PO TABS: 5.0000 mg | ORAL_TABLET | Freq: Every evening | ORAL | 3 refills | Status: DC

## 2017-02-01 MED ORDER — RIFAXIMIN 550 MG PO TABS: 550.0000 mg | ORAL_TABLET | Freq: Three times a day (TID) | ORAL | 0 refills | Status: AC

## 2017-02-01 MED ORDER — TRIAMCINOLONE ACETONIDE 55 MCG/ACT NA AERO: 4.0000 | INHALATION_SPRAY | Freq: Every day | NASAL | 5 refills | Status: DC

## 2017-02-01 NOTE — Patient Instructions (Signed)

## 2017-02-01 NOTE — Telephone Encounter (Signed)
Pt's son called stating that the prescription for rifaximin (XIFAXAN) 550 MG TABS tablet is requiring a prior authorization.

## 2017-02-01 NOTE — Progress Notes (Signed)
Subjective:  Patient ID: Evelyn [name], female    DOB: [birth year]  Age: 83 y.o. MRN: [id number]  CC: Headache; Urinary Tract Infection; and Allergic Rhinitis    HPI DANYELA POSAS presents for f/up - She is with her son today and he is concerned about her declining health. She now tells me that about 5 months ago she fell and sustained a head injury and has had a chronic, daily headache on the top of her head since then. She also complains of chronic abdominal pain that she describes as a diffuse tightness with nausea and mild weight loss. She has alternating constipation and diarrhea. Her symptoms are not well-controlled with lLinzess She's had no recent vomiting. She complains of chronic low back pain. She complains of nasal congestion, runny nose, and popping in her ears. She complains of weakness and fatigue. She wants to try new antidepressant.  Outpatient Medications Prior to Visit  Medication Sig Dispense Refill  . LINZESS 290 MCG CAPS capsule TAKE 1 CAPSULE BY MOUTH DAILY 90 capsule 3  . traMADol (ULTRAM) 50 MG tablet Take 1 tablet (50 mg total) by mouth every 12 (twelve) hours as needed for severe pain. 60 tablet 5  . zolpidem (AMBIEN) 10 MG tablet Take 0.5 tablets (5 mg total) by mouth at bedtime as needed for sleep. 30 tablet 3  . buPROPion (WELLBUTRIN) 75 MG tablet Take 1 tablet (75 mg total) by mouth 2 (two) times daily. 180 tablet 1  . FLUoxetine (PROZAC) 20 MG capsule TAKE 1 CAPSULE BY MOUTH DAILY 90 capsule 3   No facility-administered medications prior to visit.     ROS Review of Systems  Constitutional: Positive for appetite change, fatigue and unexpected weight change. Negative for activity change, diaphoresis and fever.  HENT: Positive for congestion, ear pain, postnasal drip and rhinorrhea. Negative for facial swelling, hearing loss, sinus pressure, sore throat, trouble swallowing and voice change.   Eyes: Negative.  Negative for visual disturbance.  Respiratory:  Negative.  Negative for cough, chest tightness, shortness of breath and wheezing.   Cardiovascular: Negative for chest pain, palpitations and leg swelling.  Gastrointestinal: Positive for abdominal pain, constipation and diarrhea. Negative for blood in stool, nausea and vomiting.  Endocrine: Negative.   Genitourinary: Negative.  Negative for decreased urine volume, difficulty urinating, dysuria, flank pain, hematuria and urgency.  Musculoskeletal: Positive for back pain. Negative for arthralgias, myalgias and neck pain.  Skin: Negative.  Negative for color change and rash.  Allergic/Immunologic: Negative.   Neurological: Positive for headaches. Negative for dizziness, syncope, weakness and numbness.  Hematological: Negative for adenopathy. Does not bruise/bleed easily.  Psychiatric/Behavioral: Positive for decreased concentration, dysphoric mood and sleep disturbance. Negative for confusion, self-injury and suicidal ideas. The patient is not nervous/anxious.     Objective:  BP 118/74 (BP Location: Left Arm, Patient Position: Sitting, Cuff Size: Normal)   Pulse 72   Temp 97.6 F (36.4 C) (Oral)   Resp 16   Ht 5\' 4"  (1.626 m)   Wt 104 lb (47.2 kg)   SpO2 98%   BMI 17.85 kg/m   BP Readings from Last 3 Encounters:  02/01/17 118/74  01/06/17 116/80  11/24/16 128/78    Wt Readings from Last 3 Encounters:  02/01/17 104 lb (47.2 kg)  01/06/17 108 lb 4 oz (49.1 kg)  11/24/16 109 lb 12 oz (49.8 kg)    Physical Exam  Constitutional: She is oriented to person, place, and time. No distress.  HENT:  Mouth/Throat:  Oropharynx is clear and moist. No oropharyngeal exudate.  Eyes: Conjunctivae are normal. Right eye exhibits no discharge. Left eye exhibits no discharge. No scleral icterus.  Neck: Normal range of motion. Neck supple. No JVD present. No thyromegaly present.  Cardiovascular: Normal rate, regular rhythm and intact distal pulses.  Exam reveals no gallop and no friction rub.   No  murmur heard. Pulmonary/Chest: Effort normal and breath sounds normal. No respiratory distress. She has no wheezes. She has no rales. She exhibits no tenderness.  Abdominal: Soft. Bowel sounds are normal. She exhibits no distension and no mass. There is no tenderness. There is no rebound and no guarding.  Musculoskeletal: Normal range of motion. She exhibits no edema, tenderness or deformity.  Lymphadenopathy:    She has no cervical adenopathy.  Neurological: She is alert and oriented to person, place, and time. She has normal reflexes. She displays normal reflexes. No cranial nerve deficit. She exhibits normal muscle tone. Coordination normal.  Skin: Skin is warm and dry. No rash noted. She is not diaphoretic. No erythema. No pallor.  Psychiatric: She has a normal mood and affect. Her behavior is normal. Judgment and thought content normal.  Vitals reviewed.   Lab Results  Component Value Date   WBC 6.5 11/18/2016   HGB 12.6 11/18/2016   HCT 37.8 11/18/2016   PLT 262.0 11/18/2016   GLUCOSE 91 11/18/2016   CHOL 193 12/11/2015   TRIG 95.0 12/11/2015   HDL 59.30 12/11/2015   LDLDIRECT 146.5 05/03/2013   LDLCALC 115 (H) 12/11/2015   ALT 10 11/18/2016   AST 21 11/18/2016   NA 138 11/18/2016   K 4.1 11/18/2016   CL 107 11/18/2016   CREATININE 0.98 11/18/2016   BUN 23 11/18/2016   CO2 24 11/18/2016   TSH 1.06 11/18/2016   INR 1.1 (H) 08/13/2010   HGBA1C 5.7 12/05/2014    Dg Thoracic Spine W/swimmers  Result Date: 11/19/2016 CLINICAL DATA:  Upper back pain after fall 2 months ago. EXAM: THORACIC SPINE - 3 VIEWS COMPARISON:  Radiographs of April 01, 2014. FINDINGS: Stable old compression fracture is seen involving lower thoracic vertebral body. No acute fracture or spondylolisthesis is noted. Hiatal hernia is noted. Atherosclerosis of thoracic aorta is noted. Disc spaces appear well maintained. IMPRESSION: Aortic atherosclerosis. Hiatal hernia. Stable old compression fracture of lower  thoracic spine. No acute abnormality seen in the thoracic spine. Electronically Signed   By: Lupita Raider, M.D.   On: 11/19/2016 08:08   Dg Lumbar Spine Complete  Result Date: 11/19/2016 CLINICAL DATA:  Chronic midline low back pain after fall 2 months ago. EXAM: LUMBAR SPINE - COMPLETE 4+ VIEW COMPARISON:  None. FINDINGS: No acute fracture or spondylolisthesis is noted. Old T12 fracture is again noted. Atherosclerosis of thoracic aorta is noted. Moderate degenerative disc disease is noted at L3-4, L4-5 and L5-S1. Degenerative changes seen involving the posterior facet joints of L4-5 and L5-S1. IMPRESSION: Aortic atherosclerosis. Stable T12 compression fracture. Multilevel degenerative disc disease. No acute abnormality seen in the lumbar spine. Electronically Signed   By: Lupita Raider, M.D.   On: 11/19/2016 08:10    Assessment & Plan:   Kassy was seen today for headache, urinary tract infection and allergic rhinitis .  Diagnoses and all orders for this visit:  Irritable bowel syndrome with diarrhea- will try course of Xifaxan to treat the symptoms. -     rifaximin (XIFAXAN) 550 MG TABS tablet; Take 1 tablet (550 mg total) by mouth 3 (  three) times daily.  Dysuria- her urine culture is negative, she does not have a UTI. -     Urinalysis, Routine w reflex microscopic; Future -     CULTURE, URINE COMPREHENSIVE; Future  Depression with somatization- will change her antidepressant to Cymbalta since she's tells me that Prozac and Wellbutrin is not helping. -     DULoxetine (CYMBALTA) 30 MG capsule; Take 1 capsule (30 mg total) by mouth daily.  Chronic Eustachian tube dysfunction, bilateral -     triamcinolone (NASACORT) 55 MCG/ACT AERO nasal inhaler; Place 4 sprays into the nose daily. -     levocetirizine (XYZAL) 5 MG tablet; Take 1 tablet (5 mg total) by mouth every evening.  Intractable episodic headache, unspecified headache type- will scan her brain to see if there is a mass, subdural  hematoma, ICH, or NPH. -     MR Brain Wo Contrast; Future -     CBC with Differential/Platelet; Future -     Sedimentation rate; Future -     C-reactive protein; Future  Chronic renal disease, stage 3, moderately decreased glomerular filtration rate (GFR) between 30-59-her renal function is stable. She will continue to avoid nephrotoxic agents. mL/min/1.73 square meter -     Comprehensive metabolic panel; Future  Hyperlipidemia with target LDL less than 130- statin therapy is not indicated. -     Thyroid Panel With TSH; Future   I have discontinued Ms. Scannell's FLUoxetine and buPROPion. I am also having her start on rifaximin, DULoxetine, triamcinolone, and levocetirizine. Additionally, I am having her maintain her LINZESS, traMADol, and zolpidem.  Meds ordered this encounter  Medications  . rifaximin (XIFAXAN) 550 MG TABS tablet    Sig: Take 1 tablet (550 mg total) by mouth 3 (three) times daily.    Dispense:  42 tablet    Refill:  0  . DULoxetine (CYMBALTA) 30 MG capsule    Sig: Take 1 capsule (30 mg total) by mouth daily.    Dispense:  90 capsule    Refill:  1  . triamcinolone (NASACORT) 55 MCG/ACT AERO nasal inhaler    Sig: Place 4 sprays into the nose daily.    Dispense:  32.4 mL    Refill:  5  . levocetirizine (XYZAL) 5 MG tablet    Sig: Take 1 tablet (5 mg total) by mouth every evening.    Dispense:  90 tablet    Refill:  3     Follow-up: No Follow-up on file.  Sanda Lingerhomas Neli Fofana, MD

## 2017-02-02 ENCOUNTER — Telehealth: Payer: Self-pay | Admitting: Internal Medicine

## 2017-02-02 LAB — THYROID PANEL WITH TSH
FREE THYROXINE INDEX: 2.5 (ref 1.4–3.8)
T3 UPTAKE: 28 % (ref 22–35)
T4, Total: 8.9 ug/dL (ref 5.1–11.9)
TSH: 1.4 mIU/L

## 2017-02-02 NOTE — Telephone Encounter (Signed)
Key: [id number]

## 2017-02-02 NOTE — Telephone Encounter (Addendum)
Pt given results from 8/14, expressed understanding, encouraged fluids.

## 2017-02-02 NOTE — Telephone Encounter (Signed)
LVM for pt to call back as soon as possible.   

## 2017-02-03 LAB — CULTURE, URINE COMPREHENSIVE: ORGANISM ID, BACTERIA: NO GROWTH

## 2017-02-07 ENCOUNTER — Encounter: Payer: Self-pay | Admitting: Gastroenterology

## 2017-02-07 ENCOUNTER — Ambulatory Visit
Admission: RE | Admit: 2017-02-07 | Discharge: 2017-02-07 | Disposition: A | Discharge status: Home / Self Care | Payer: Medicare Other | Source: Ambulatory Visit | Attending: Internal Medicine | Admitting: Internal Medicine

## 2017-02-07 DIAGNOSIS — R519 Headache, unspecified: Secondary | ICD-10-CM

## 2017-02-07 DIAGNOSIS — R51 Headache: Secondary | ICD-10-CM | POA: Diagnosis not present

## 2017-02-07 NOTE — Telephone Encounter (Signed)
Called [employer] and informed that the PA has been approved.

## 2017-02-17 ENCOUNTER — Ambulatory Visit: Payer: Self-pay | Admitting: Gastroenterology

## 2017-03-21 DIAGNOSIS — Z23 Encounter for immunization: Secondary | ICD-10-CM | POA: Diagnosis not present

## 2017-04-19 DIAGNOSIS — H524 Presbyopia: Secondary | ICD-10-CM | POA: Diagnosis not present

## 2017-04-19 DIAGNOSIS — H52202 Unspecified astigmatism, left eye: Secondary | ICD-10-CM | POA: Diagnosis not present

## 2017-04-19 DIAGNOSIS — H31001 Unspecified chorioretinal scars, right eye: Secondary | ICD-10-CM | POA: Diagnosis not present

## 2017-04-19 DIAGNOSIS — Z961 Presence of intraocular lens: Secondary | ICD-10-CM | POA: Diagnosis not present

## 2017-05-02 ENCOUNTER — Telehealth: Payer: Self-pay

## 2017-05-02 NOTE — Telephone Encounter (Signed)
PA for xifaxin received. This medication is not the patients medication list.   Okay to re- rx?

## 2017-05-02 NOTE — Telephone Encounter (Signed)
yes

## 2017-05-06 MED ORDER — RIFAXIMIN 550 MG PO TABS: 550.0000 mg | ORAL_TABLET | Freq: Three times a day (TID) | ORAL | 0 refills | Status: DC

## 2017-05-11 NOTE — Telephone Encounter (Signed)
I was not able to submitted any clinical documentation.  Information verified with pharmacy and pt rx coverage.   Left message for Misty StanleyLisa Hipp (pt dtr) to call back.

## 2017-05-11 NOTE — Telephone Encounter (Signed)
Spoke to pt dtr and informed that PA was previously approved. Pt dtr will call pharmacy and inform of same.

## 2017-07-16 ENCOUNTER — Emergency Department (HOSPITAL_COMMUNITY): Payer: Medicare Other

## 2017-07-16 ENCOUNTER — Emergency Department (HOSPITAL_COMMUNITY)
Admission: EM | Admit: 2017-07-16 | Discharge: 2017-07-17 | Disposition: A | Discharge status: Home / Self Care | Payer: Medicare Other | Attending: Emergency Medicine | Admitting: Emergency Medicine

## 2017-07-16 ENCOUNTER — Encounter (HOSPITAL_COMMUNITY): Payer: Self-pay | Admitting: Emergency Medicine

## 2017-07-16 DIAGNOSIS — R05 Cough: Secondary | ICD-10-CM | POA: Diagnosis not present

## 2017-07-16 DIAGNOSIS — W228XXA Striking against or struck by other objects, initial encounter: Secondary | ICD-10-CM | POA: Diagnosis not present

## 2017-07-16 DIAGNOSIS — Y999 Unspecified external cause status: Secondary | ICD-10-CM | POA: Diagnosis not present

## 2017-07-16 DIAGNOSIS — K222 Esophageal obstruction: Secondary | ICD-10-CM | POA: Diagnosis not present

## 2017-07-16 DIAGNOSIS — R1312 Dysphagia, oropharyngeal phase: Secondary | ICD-10-CM | POA: Diagnosis not present

## 2017-07-16 DIAGNOSIS — Y9389 Activity, other specified: Secondary | ICD-10-CM | POA: Diagnosis not present

## 2017-07-16 DIAGNOSIS — R030 Elevated blood-pressure reading, without diagnosis of hypertension: Secondary | ICD-10-CM | POA: Diagnosis not present

## 2017-07-16 DIAGNOSIS — R1110 Vomiting, unspecified: Secondary | ICD-10-CM | POA: Diagnosis not present

## 2017-07-16 DIAGNOSIS — Z79899 Other long term (current) drug therapy: Secondary | ICD-10-CM | POA: Diagnosis not present

## 2017-07-16 DIAGNOSIS — T17300A Unspecified foreign body in larynx causing asphyxiation, initial encounter: Secondary | ICD-10-CM | POA: Diagnosis not present

## 2017-07-16 DIAGNOSIS — N183 Chronic kidney disease, stage 3 (moderate): Secondary | ICD-10-CM | POA: Insufficient documentation

## 2017-07-16 DIAGNOSIS — T18128A Food in esophagus causing other injury, initial encounter: Secondary | ICD-10-CM

## 2017-07-16 DIAGNOSIS — Y929 Unspecified place or not applicable: Secondary | ICD-10-CM | POA: Insufficient documentation

## 2017-07-16 MED ORDER — ONDANSETRON HCL 4 MG/2ML IJ SOLN
4.0000 mg | Freq: Once | INTRAMUSCULAR | Status: AC
  Administered 2017-07-16: 4 mg via INTRAVENOUS
  Filled 2017-07-16: qty 2

## 2017-07-16 NOTE — ED Notes (Signed)
Patient transported to X-ray 

## 2017-07-16 NOTE — ED Notes (Signed)
Bed: WA20 Expected date:  Expected time:  Means of arrival:  Comments: 84 yr old choking sensation

## 2017-07-16 NOTE — ED Triage Notes (Signed)
Pt comes from home via EMS with complaints of a choking sensation. States she was eating roast beef 4.5 hours ago. Feels like a piece got stuck in her throat.  Now endorses chest pain, light headedness, and dizziness. Lung sounds clear. Productive cough. Vitals stable in route.

## 2017-07-16 NOTE — ED Provider Notes (Signed)
[facility]-EMERGENCY DEPT Provider Note   CSN: [id number] Arrival date & time: 07/16/17  2212     History   Chief Complaint Chief Complaint  Patient presents with  . Choking Sensation  . Chest Pain    HPI Evelyn [name] is a 84 y.o. female.  Patient presents to the emergency department for evaluation of a sensation of food being stuck in her esophagus.  Patient reports that after she ate her breast before dinner tonight she started to get hiccups and feel like there was something caught.  She vomited and some pieces of meat came up but she continued to have the sensation.  She reports that since that time she has not been able to drink anything because it comes back up.  She reports that she intermittently gets hiccups and that her mouth.  With phlegm, then she vomits.      Past Medical History:  Diagnosis Date  . Chronic cystitis   . Congenital blindness    right eye  . H/O: hematuria   . Hepatitis, unspecified    statin related, resolved quickly  . Hyperlipidemia   . Legally blind in right eye, as defined in [place]   . Syncope and collapse 04/01/2014  . UTI (lower urinary tract infection)    HISTORY OF UTI    Patient Active Problem List   Diagnosis Date Noted  . Dysuria 02/01/2017  . Chronic Eustachian tube dysfunction, bilateral 02/01/2017  . Intractable episodic headache 02/01/2017  . Closed wedge compression fracture of T12 vertebra (HCC) 11/24/2016  . Chronic midline low back pain 11/18/2016  . Chronic renal disease, stage 3, moderately decreased glomerular filtration rate (GFR) between 30-59 mL/min/1.73 square meter (HCC) 11/18/2016  . IBS (irritable bowel syndrome)   . Routine general medical examination at a health care facility 05/03/2013  . Constipation 03/31/2012  . Other screening mammogram 03/24/2011  . Depression with somatization 03/06/2010  . Insomnia 03/08/2008  . Hyperlipidemia with target LDL less than 130 02/21/2007     Past Surgical History:  Procedure Laterality Date  . ABDOMINAL HYSTERECTOMY     vaginal  . BLADDER SUSPENSION     A-P  . CATARACT EXTRACTION W/ INTRAOCULAR LENS IMPLANT Left   . OPEN REDUCTION INTERNAL FIXATION (ORIF) DISTAL RADIAL FRACTURE Right 03/09/2016   Procedure: OPEN REDUCTION INTERNAL FIXATION (ORIF) DISTAL RADIAL FRACTURE;  Surgeon: Betha Loa, MD;  Location: [facility];  Service: Orthopedics;  Laterality: Right;    OB History    No data available       Home Medications    Prior to Admission medications   Medication Sig Start Date End Date Taking? Authorizing Provider  DULoxetine (CYMBALTA) 30 MG capsule Take 1 capsule (30 mg total) by mouth daily. 02/01/17   Etta Grandchild, MD  levocetirizine (XYZAL) 5 MG tablet Take 1 tablet (5 mg total) by mouth every evening. 02/01/17   Etta Grandchild, MD  LINZESS 290 MCG CAPS capsule TAKE 1 CAPSULE BY MOUTH DAILY 08/09/16   Etta Grandchild, MD  rifaximin (XIFAXAN) 550 MG TABS tablet Take 1 tablet (550 mg total) 3 (three) times daily by mouth. 05/06/17   Etta Grandchild, MD  traMADol (ULTRAM) 50 MG tablet Take 1 tablet (50 mg total) by mouth every 12 (twelve) hours as needed for severe pain. 11/29/16   Etta Grandchild, MD  triamcinolone (NASACORT) 55 MCG/ACT AERO nasal inhaler Place 4 sprays into the nose daily. 02/01/17   Yetta Barre,  Bernadene Bell, MD  zolpidem (AMBIEN) 10 MG tablet Take 0.5 tablets (5 mg total) by mouth at bedtime as needed for sleep. 01/06/17   Etta Grandchild, MD    Family History Family History  Problem Relation Age of Onset  . Cancer Neg Hx        Colon cancer  . Stroke Neg Hx   . Heart disease Neg Hx   . Hyperlipidemia Neg Hx   . Hypertension Neg Hx   . Kidney disease Neg Hx     Social History Social History   Tobacco Use  . Smoking status: Never Smoker  . Smokeless tobacco: Never Used  Substance Use Topics  . Alcohol use: No  . Drug use: No     Allergies   Patient has no known  allergies.   Review of Systems Review of Systems  HENT: Positive for trouble swallowing.   Gastrointestinal: Positive for vomiting.  All other systems reviewed and are negative.    Physical Exam Updated Vital Signs BP (!) 152/75   Pulse 79   Temp (!) 97.4 F (36.3 C) (Oral)   Resp 19   Ht 5\' 4"  (1.626 m)   Wt 49.9 kg (110 lb)   SpO2 99%   BMI 18.88 kg/m   Physical Exam  Constitutional: She is oriented to person, place, and time. She appears well-developed and well-nourished. No distress.  HENT:  Head: Normocephalic and atraumatic.  Right Ear: Hearing normal.  Left Ear: Hearing normal.  Nose: Nose normal.  Mouth/Throat: Oropharynx is clear and moist and mucous membranes are normal.  Eyes: Conjunctivae and EOM are normal. Pupils are equal, round, and reactive to light.  Neck: Normal range of motion. Neck supple.  Cardiovascular: Regular rhythm, S1 normal and S2 normal. Exam reveals no gallop and no friction rub.  No murmur heard. Pulmonary/Chest: Effort normal and breath sounds normal. No respiratory distress. She exhibits no tenderness.  Abdominal: Soft. Normal appearance and bowel sounds are normal. There is no hepatosplenomegaly. There is no tenderness. There is no rebound, no guarding, no tenderness at McBurney's point and negative Murphy's sign. No hernia.  Musculoskeletal: Normal range of motion.  Neurological: She is alert and oriented to person, place, and time. She has normal strength. No cranial nerve deficit or sensory deficit. Coordination normal. GCS eye subscore is 4. GCS verbal subscore is 5. GCS motor subscore is 6.  Skin: Skin is warm, dry and intact. No rash noted. No cyanosis.  Psychiatric: She has a normal mood and affect. Her speech is normal and behavior is normal. Thought content normal.  Nursing note and vitals reviewed.    ED Treatments / Results  Labs (all labs ordered are listed, but only abnormal results are displayed) Labs Reviewed  BASIC  METABOLIC PANEL - Abnormal; Notable for the following components:      Result Value   CO2 21 (*)    Glucose, Bld 122 (*)    BUN 31 (*)    Creatinine, Ser 1.15 (*)    GFR calc non Af Amer 41 (*)    GFR calc Af Amer 47 (*)    All other components within normal limits  CBC  I-STAT TROPONIN, ED    EKG  EKG Interpretation  Date/Time:  Saturday July 16 2017 22:21:39 EST Ventricular Rate:  72 PR Interval:    QRS Duration: 86 QT Interval:  412 QTC Calculation: 451 R Axis:   76 Text Interpretation:  Sinus rhythm Abnormal R-wave progression, early transition  Otherwise within normal limits Confirmed by Gilda Creaseollina, Christopher J [phone]([id number]) on 07/16/2017 11:02:41 PM       Radiology Dg Chest 2 View  Result Date: 07/16/2017 CLINICAL DATA:  Epigastric pain with cough EXAM: CHEST  2 VIEW COMPARISON:  01/30/2015 FINDINGS: Hyperinflation. No focal pulmonary infiltrate or effusion. Moderate hiatal hernia. Normal heart size. Aortic atherosclerosis. No pneumothorax. Stable compression deformity of lower thoracic vertebra IMPRESSION: Hyperinflation without focal infiltrate.  Moderate hiatal hernia Electronically Signed   By: Jasmine PangKim  Fujinaga M.D.   On: 07/16/2017 23:08    Procedures Procedures (including critical care time)  Medications Ordered in ED Medications  ondansetron (ZOFRAN) injection 4 mg (4 mg Intravenous Given 07/16/17 2326)     Initial Impression / Assessment and Plan / ED Course  I have reviewed the triage vital signs and the nursing notes.  Pertinent labs & imaging results that were available during my care of the patient were reviewed by me and considered in my medical decision making (see chart for details).     Patient presents to the emergency department for evaluation of esophageal impaction.  Patient was eating roast beef earlier today when it felt like it got caught upon swallowing.  She initially was unable to swallow any liquids or her own secretions.  While awaiting  treatment here in the ER, however, it appears that the impaction has cleared.  Her symptoms have resolved and she has now able to drink water without difficulty.  Patient instructed to cut her foods small and she was thoroughly before swallowing, will refer to GI.  Final Clinical Impressions(s) / ED Diagnoses   Final diagnoses:  Esophageal obstruction due to food impaction    ED Discharge Orders    None       Gilda CreasePollina, Christopher J, MD 07/17/17 0110

## 2017-07-17 DIAGNOSIS — T17320A Food in larynx causing asphyxiation, initial encounter: Secondary | ICD-10-CM | POA: Diagnosis not present

## 2017-07-17 DIAGNOSIS — R0789 Other chest pain: Secondary | ICD-10-CM | POA: Diagnosis not present

## 2017-07-17 LAB — CBC
HCT: 39.1 % (ref 36.0–46.0)
HEMOGLOBIN: 12.9 g/dL (ref 12.0–15.0)
MCH: 31 pg (ref 26.0–34.0)
MCHC: 33 g/dL (ref 30.0–36.0)
MCV: 94 fL (ref 78.0–100.0)
PLATELETS: 243 10*3/uL (ref 150–400)
RBC: 4.16 MIL/uL (ref 3.87–5.11)
RDW: 12.7 % (ref 11.5–15.5)
WBC: 9.1 10*3/uL (ref 4.0–10.5)

## 2017-07-17 LAB — BASIC METABOLIC PANEL
ANION GAP: 11 (ref 5–15)
BUN: 31 mg/dL — ABNORMAL HIGH (ref 6–20)
CALCIUM: 9.5 mg/dL (ref 8.9–10.3)
CO2: 21 mmol/L — AB (ref 22–32)
Chloride: 111 mmol/L (ref 101–111)
Creatinine, Ser: 1.15 mg/dL — ABNORMAL HIGH (ref 0.44–1.00)
GFR calc non Af Amer: 41 mL/min — ABNORMAL LOW (ref >60)
GFR, EST AFRICAN AMERICAN: 47 mL/min — AB (ref >60)
Glucose, Bld: 122 mg/dL — ABNORMAL HIGH (ref 65–99)
Potassium: 4.3 mmol/L (ref 3.5–5.1)
SODIUM: 143 mmol/L (ref 135–145)

## 2017-07-17 NOTE — Discharge Instructions (Signed)
Cut your food into small pieces and chew it very thoroughly before swallowing.  If symptoms recur, come back to the ER.  Call your gastroenterologist for follow-up.

## 2017-07-17 NOTE — ED Notes (Signed)
[facility] called for transport.  

## 2017-07-18 ENCOUNTER — Encounter: Payer: Self-pay | Admitting: Gastroenterology

## 2017-07-26 ENCOUNTER — Other Ambulatory Visit: Payer: Self-pay | Admitting: Internal Medicine

## 2017-07-26 DIAGNOSIS — F5101 Primary insomnia: Secondary | ICD-10-CM

## 2017-08-30 ENCOUNTER — Encounter: Payer: Self-pay | Admitting: Gastroenterology

## 2017-08-30 ENCOUNTER — Encounter (INDEPENDENT_AMBULATORY_CARE_PROVIDER_SITE_OTHER): Payer: Self-pay

## 2017-08-30 ENCOUNTER — Ambulatory Visit (INDEPENDENT_AMBULATORY_CARE_PROVIDER_SITE_OTHER): Payer: Medicare Other | Admitting: Gastroenterology

## 2017-08-30 VITALS — BP 110/58 | HR 98 | Ht 64.0 in | Wt 109.0 lb

## 2017-08-30 DIAGNOSIS — R1310 Dysphagia, unspecified: Secondary | ICD-10-CM | POA: Diagnosis not present

## 2017-08-30 NOTE — Patient Instructions (Addendum)
You will be set up for an upper endoscopy for dysphagia, swallowing trouble.  Please start taking citrucel (orange flavored) powder fiber supplement.  This may cause some bloating at first but that usually goes away. Begin with a small spoonful and work your way up to a large, heaping spoonful daily over a week.  Stay hydrated for your bowels.  Chew your food well, eat slowly and take small bites  Normal BMI (Body Mass Index- based on height and weight) is between 23 and 30. Your BMI today is Body mass index is 18.71 kg/m. Marland Kitchen. Please consider follow up  regarding your BMI with your Primary Care Provider..Marland Kitchen

## 2017-08-30 NOTE — Progress Notes (Signed)
HPI: This is a very pleasant 84 year old woman who was referred to me by Etta GrandchildJones, Thomas L, MD  to evaluate recent esophageal food impaction.    Chief complaint is recent esophageal food impaction  She is here with her daughter today.  About 6 weeks ago she had a stringy beef entre and felt that the food home, lodged in her upper esophagus.  She had some coughing but mostly the sensation of inability to swallow any further.  She needed a spit cup at her side for 1-2 hours at least.  She went to the emergency room and after some anxiolytics the food impaction passed without need for other intervention.  She does not have any GERD symptoms.  She does have very mild intermittent solid food dysphagia.  Her daughter thinks she has been losing weight recently.  She is admittedly the thinnest she has ever been but she thinks in the past year her weight has been overall stable.  EGD 2009 She had a slightly snug GE junction which was dilated to 2 cm with a balloon. Biopsies of her small intestine were normal and so she does not have celiac sprue.    Old Data Reviewed:     Review of systems: Pertinent positive and negative review of systems were noted in the above HPI section. All other review negative.   Past Medical History:  Diagnosis Date  . Chronic cystitis   . Congenital blindness    right eye  . H/O: hematuria   . Hepatitis, unspecified    statin related, resolved quickly  . Hyperlipidemia   . Legally blind in right eye, as [place] in [place]   . Syncope and collapse 04/01/2014  . UTI (lower urinary tract infection)    HISTORY OF UTI    Past Surgical History:  Procedure Laterality Date  . ABDOMINAL HYSTERECTOMY     vaginal  . BLADDER SUSPENSION     A-P  . CATARACT EXTRACTION W/ INTRAOCULAR LENS IMPLANT Left   . OPEN REDUCTION INTERNAL FIXATION (ORIF) DISTAL RADIAL FRACTURE Right 03/09/2016   Procedure: OPEN REDUCTION INTERNAL FIXATION (ORIF) DISTAL RADIAL FRACTURE;  Surgeon:  Betha LoaKevin Kuzma, MD;  Location: [facility];  Service: Orthopedics;  Laterality: Right;    Current Outpatient Medications  Medication Sig Dispense Refill  . DULoxetine (CYMBALTA) 30 MG capsule Take 1 capsule (30 mg total) by mouth daily. 90 capsule 1  . levocetirizine (XYZAL) 5 MG tablet Take 1 tablet (5 mg total) by mouth every evening. 90 tablet 3  . LINZESS 290 MCG CAPS capsule TAKE 1 CAPSULE BY MOUTH DAILY 90 capsule 3  . traMADol (ULTRAM) 50 MG tablet Take 1 tablet (50 mg total) by mouth every 12 (twelve) hours as needed for severe pain. 60 tablet 5  . triamcinolone (NASACORT) 55 MCG/ACT AERO nasal inhaler Place 4 sprays into the nose daily. 32.4 mL 5  . zolpidem (AMBIEN) 10 MG tablet TAKE 0.5 TABLET BY MOUTH EVERY NIGHT AT BEDTIME AS NEEDED FOR SLEEP 30 tablet 5   No current facility-administered medications for this visit.     Allergies as of 08/30/2017  . (No Known Allergies)    Family History  Problem Relation Age of Onset  . Cancer Neg Hx        Colon cancer  . Stroke Neg Hx   . Heart disease Neg Hx   . Hyperlipidemia Neg Hx   . Hypertension Neg Hx   . Kidney disease Neg Hx     Social  History   Socioeconomic History  . Marital status: Widowed    Spouse name: Not on file  . Number of children: Not on file  . Years of education: Not on file  . Highest education level: Not on file  Social Needs  . Financial resource strain: Not on file  . Food insecurity - worry: Not on file  . Food insecurity - inability: Not on file  . Transportation needs - medical: Not on file  . Transportation needs - non-medical: Not on file  Occupational History  . Occupation: Retired  Tobacco Use  . Smoking status: Never Smoker  . Smokeless tobacco: Never Used  Substance and Sexual Activity  . Alcohol use: No  . Drug use: No  . Sexual activity: Not Currently  Other Topics Concern  . Not on file  Social History Narrative  . Not on file     Physical Exam: Ht 5\' 4"   (1.626 m)   Wt 109 lb (49.4 kg)   BMI 18.71 kg/m  Constitutional: generally well-appearing Psychiatric: alert and oriented x3 Eyes: extraocular movements intact Mouth: oral pharynx moist, no lesions Neck: supple no lymphadenopathy Cardiovascular: heart regular rate and rhythm Lungs: clear to auscultation bilaterally Abdomen: soft, nontender, nondistended, no obvious ascites, no peritoneal signs, normal bowel sounds Extremities: no lower extremity edema bilaterally Skin: no lesions on visible extremities   Assessment and plan: 84 y.o. female with intermittent solid food dysphasia, possible recent weight loss, recent food impaction  I recommended we proceed with EGD at her soonest convenience to exclude neoplasm and dilate if appropriate.  She is 89 and certainly a bit frail but she lives alone and takes care of her house and is in remarkably good shape overall for 89.   Please see the "Patient Instructions" section for addition details about the plan.   Rob Bunting, MD  Gastroenterology 08/30/2017, 2:37 PM  Cc: Etta Grandchild, MD

## 2017-10-18 ENCOUNTER — Other Ambulatory Visit: Payer: Self-pay

## 2017-10-18 ENCOUNTER — Encounter: Payer: Self-pay | Admitting: Gastroenterology

## 2017-10-18 ENCOUNTER — Ambulatory Visit (AMBULATORY_SURGERY_CENTER): Payer: Medicare Other | Admitting: Gastroenterology

## 2017-10-18 VITALS — BP 117/49 | HR 70 | Temp 97.8°F | Resp 17 | Ht 64.0 in | Wt 109.0 lb

## 2017-10-18 DIAGNOSIS — K3189 Other diseases of stomach and duodenum: Secondary | ICD-10-CM | POA: Diagnosis not present

## 2017-10-18 DIAGNOSIS — K222 Esophageal obstruction: Secondary | ICD-10-CM | POA: Diagnosis not present

## 2017-10-18 DIAGNOSIS — K253 Acute gastric ulcer without hemorrhage or perforation: Secondary | ICD-10-CM | POA: Diagnosis not present

## 2017-10-18 DIAGNOSIS — R1310 Dysphagia, unspecified: Secondary | ICD-10-CM

## 2017-10-18 MED ORDER — SODIUM CHLORIDE 0.9 % IV SOLN: 500.0000 mL | Freq: Once | INTRAVENOUS | Status: DC

## 2017-10-18 MED ORDER — OMEPRAZOLE 40 MG PO CPDR: 40.0000 mg | DELAYED_RELEASE_CAPSULE | Freq: Every day | ORAL | 3 refills | Status: DC

## 2017-10-18 NOTE — Progress Notes (Signed)
Report given to PACU, vss 

## 2017-10-18 NOTE — Progress Notes (Signed)
Pt's states no medical or surgical changes since previsit or office visit. 

## 2017-10-18 NOTE — Op Note (Addendum)
[facility] Patient Name: Evelyn [name] Procedure Date: 10/18/2017 9:44 AM MRN: [id number] Endoscopist: Rachael Fee , MD Age: 84 Referring MD:  Date of Birth: [birth year] Gender: Female Account #: [id number] Procedure:                Upper GI endoscopy Indications:              Dysphagia, recent food impaction (cleared without                            EGD), likely weight loss Medicines:                Monitored Anesthesia Care Procedure:                Pre-Anesthesia Assessment:                           - Prior to the procedure, a History and Physical                            was performed, and patient medications and                            allergies were reviewed. The patient's tolerance of                            previous anesthesia was also reviewed. The risks                            and benefits of the procedure and the sedation                            options and risks were discussed with the patient.                            All questions were answered, and informed consent                            was obtained. Prior Anticoagulants: The patient has                            taken no previous anticoagulant or antiplatelet                            agents. ASA Grade Assessment: II - A patient with                            mild systemic disease. After reviewing the risks                            and benefits, the patient was deemed in                            satisfactory condition to undergo the procedure.  After obtaining informed consent, the endoscope was                            passed under direct vision. Throughout the                            procedure, the patient's blood pressure, pulse, and                            oxygen saturations were monitored continuously. The                            Endoscope was introduced through the mouth, and                            advanced to the second part of  duodenum. The upper                            GI endoscopy was accomplished without difficulty.                            The patient tolerated the procedure well. Scope In: Scope Out: Findings:                 Medium sized hiatal hernia with typical                            foreshortened, tortuous esophagus.                           One benign-appearing, intrinsic mild stenosis                            (mucosal, schatzki's type ring) was found at the                            gastroesophageal junction. A TTS dilator was passed                            through the scope. Dilation with an 18-19-20 mm                            balloon dilator was performed to 19 mm.                           Mild distal gastritis with a cratered, clean based,                            5mm ulcer in the antrum. The mucosa surrounding the                            ulcer was edematous but not overtly neoplastic and  was biopsied extensively.                           The exam was otherwise without abnormality. Complications:            No immediate complications. Estimated blood loss:                            None. Estimated Blood Loss:     Estimated blood loss: none. Impression:               - Hiatal hernia with typical tortuous,                            foreshortented esophagus. This is probably                            contributing to her dysphagia. There was a minor                            stenosis at the GE junction that was clearly                            benign, dilated to 19mm.                           - Distal gastritis with a clean based antral ulcer.                            Biopsies taken. Recommendation:           - Patient has a contact number available for                            emergencies. The signs and symptoms of potential                            delayed complications were discussed with the                            patient.  Return to normal activities tomorrow.                            Written discharge instructions were provided to the                            patient.                           - Resume previous diet.                           - Continue present medications. Avoid NSAIDs for                            now. Will start once daily omeprazole for now (new  script called in).                           - Await pathology results. If H. pylori is noted,                            will start on appropriate antibiotics. Rachael Fee, MD 10/18/2017 10:05:59 AM This report has been signed electronically.

## 2017-10-18 NOTE — Progress Notes (Signed)
Called to room to assist during endoscopic procedure.  Patient ID and intended procedure confirmed with present staff. Received instructions for my participation in the procedure from the performing physician.  

## 2017-10-18 NOTE — Patient Instructions (Signed)
YOU HAD AN ENDOSCOPIC PROCEDURE TODAY AT THE [facility]:   Refer to the procedure report that was given to you for any specific questions about what was found during the examination.  If the procedure report does not answer your questions, please call your gastroenterologist to clarify.  If you requested that your care partner not be given the details of your procedure findings, then the procedure report has been included in a sealed envelope for you to review at your convenience later.  YOU SHOULD EXPECT: Some feelings of bloating in the abdomen. Passage of more gas than usual.  Walking can help get rid of the air that was put into your GI tract during the procedure and reduce the bloating. If you had a lower endoscopy (such as a colonoscopy or flexible sigmoidoscopy) you may notice spotting of blood in your stool or on the toilet paper. If you underwent a bowel prep for your procedure, you may not have a normal bowel movement for a few days.  Please Note:  You might notice some irritation and congestion in your nose or some drainage.  This is from the oxygen used during your procedure.  There is no need for concern and it should clear up in a day or so.  SYMPTOMS TO REPORT IMMEDIATELY:    Following upper endoscopy (EGD)  Vomiting of blood or coffee ground material  New chest pain or pain under the shoulder blades  Painful or persistently difficult swallowing  New shortness of breath  Fever of 100F or higher  Black, tarry-looking stools  Start Omeprazole 40 mg one tablet daily.  Avoid NSAIDS for now.  Please see handouts given to you on Post dilation diet, Hiatal Hernia and Stricture.  For urgent or emergent issues, a gastroenterologist can be reached at any hour by calling [phone].   DIET:  Please see instructions on Post Dilation diet. Nothing by mouth until 11:00 am. Then clear liquids for an hour and start Soft diet at 12:00 for the rest of the day. Tomorrow  you  may proceed to your regular diet.  Drink plenty of fluids but you should avoid alcoholic beverages for 24 hours.  ACTIVITY:  You should plan to take it easy for the rest of today and you should NOT DRIVE or use heavy machinery until tomorrow (because of the sedation medicines used during the test).    FOLLOW UP: Our staff will call the number listed on your records the next business day following your procedure to check on you and address any questions or concerns that you may have regarding the information given to you following your procedure. If we do not reach you, we will leave a message.  However, if you are feeling well and you are not experiencing any problems, there is no need to return our call.  We will assume that you have returned to your regular daily activities without incident.  If any biopsies were taken you will be contacted by phone or by letter within the next 1-3 weeks.  Please call us at [phone] if you have not heard about the biopsies in 3 weeks.    SIGNATURES/CONFIDENTIALITY: You and/or your care partner have signed paperwork which will be entered into your electronic medical record.  These signatures attest to the fact that that the information above on your After Visit Summary has been reviewed and is understood.  Full responsibility of the confidentiality of this discharge information lies with you and/or your care-partner.  Thank you for letting us take care of your healthcare needs today.

## 2017-10-19 ENCOUNTER — Telehealth: Payer: Self-pay

## 2017-10-19 NOTE — Telephone Encounter (Signed)
  Follow up Call-  Call back number 10/18/2017  Post procedure Call Back phone  # [phone]  Permission to leave phone message Yes  Some recent data might be hidden     Patient questions:  Do you have a fever, pain , or abdominal swelling? No. Pain Score  0 *  Have you tolerated food without any problems? Yes.    Have you been able to return to your normal activities? Yes.    Do you have any questions about your discharge instructions: Diet   No. Medications  No. Follow up visit  No.  Do you have questions or concerns about your Care? No.  Actions: * If pain score is 4 or above: No action needed, pain <4.

## 2017-10-24 ENCOUNTER — Telehealth: Payer: Self-pay | Admitting: Gastroenterology

## 2017-10-24 NOTE — Telephone Encounter (Signed)
The pt's daughter was advised that the path is not back yet and we will call as soon as available. She thanked me for calling.

## 2017-10-25 ENCOUNTER — Other Ambulatory Visit: Payer: Self-pay | Admitting: Gastroenterology

## 2017-10-25 MED ORDER — OMEPRAZOLE 40 MG PO CPDR: 40.0000 mg | DELAYED_RELEASE_CAPSULE | Freq: Two times a day (BID) | ORAL | 6 refills | Status: DC

## 2017-11-16 ENCOUNTER — Ambulatory Visit (INDEPENDENT_AMBULATORY_CARE_PROVIDER_SITE_OTHER): Payer: Medicare Other | Admitting: Internal Medicine

## 2017-11-16 ENCOUNTER — Encounter: Payer: Self-pay | Admitting: Internal Medicine

## 2017-11-16 VITALS — BP 140/80 | HR 78 | Temp 97.6°F | Resp 16 | Ht 64.0 in | Wt 110.2 lb

## 2017-11-16 DIAGNOSIS — K5901 Slow transit constipation: Secondary | ICD-10-CM | POA: Diagnosis not present

## 2017-11-16 DIAGNOSIS — G8929 Other chronic pain: Secondary | ICD-10-CM

## 2017-11-16 DIAGNOSIS — S22080G Wedge compression fracture of T11-T12 vertebra, subsequent encounter for fracture with delayed healing: Secondary | ICD-10-CM

## 2017-11-16 DIAGNOSIS — M5440 Lumbago with sciatica, unspecified side: Secondary | ICD-10-CM

## 2017-11-16 DIAGNOSIS — N183 Chronic kidney disease, stage 3 unspecified: Secondary | ICD-10-CM

## 2017-11-16 DIAGNOSIS — F450 Somatization disorder: Secondary | ICD-10-CM

## 2017-11-16 DIAGNOSIS — F329 Major depressive disorder, single episode, unspecified: Secondary | ICD-10-CM | POA: Diagnosis not present

## 2017-11-16 DIAGNOSIS — F32A Depression, unspecified: Secondary | ICD-10-CM

## 2017-11-16 DIAGNOSIS — M545 Low back pain: Secondary | ICD-10-CM | POA: Diagnosis not present

## 2017-11-16 MED ORDER — FLUOXETINE HCL 20 MG PO CAPS: ORAL_CAPSULE | ORAL | 1 refills | Status: DC

## 2017-11-16 MED ORDER — PRUCALOPRIDE SUCCINATE 2 MG PO TABS: 1.0000 | ORAL_TABLET | Freq: Every day | ORAL | 1 refills | Status: DC

## 2017-11-16 MED ORDER — TRAMADOL HCL 50 MG PO TABS: 50.0000 mg | ORAL_TABLET | Freq: Four times a day (QID) | ORAL | 5 refills | Status: DC | PRN

## 2017-11-16 NOTE — Progress Notes (Signed)
Subjective:  Patient ID: Evelyn [name], female    DOB: June 29, 1924  Age: 84 y.o. MRN: [id number]  CC: Depression and Back Pain   HPI Evelyn [name] presents for f/up -she complains of persistent and somewhat worsening pain in her thoracic and lumbar spine.  The pain radiates into the lower extremities and she has mild, bilateral weakness.  She denies numbness or tingling.  She has had no recent falls or injury.  She cannot take NSAIDs because she has peptic ulcer disease.  She takes tramadol to control the pain.    She also complains of chronic, severe constipation.  She has tried Linzess but has not achieved any benefit with this.  Outpatient Medications Prior to Visit  Medication Sig Dispense Refill  . levocetirizine (XYZAL) 5 MG tablet Take 1 tablet (5 mg total) by mouth every evening. 90 tablet 3  . omeprazole (PRILOSEC) 40 MG capsule Take 1 capsule (40 mg total) by mouth 2 (two) times daily. 60 capsule 6  . triamcinolone (NASACORT) 55 MCG/ACT AERO nasal inhaler Place 4 sprays into the nose daily. 32.4 mL 5  . zolpidem (AMBIEN) 10 MG tablet TAKE 0.5 TABLET BY MOUTH EVERY NIGHT AT BEDTIME AS NEEDED FOR SLEEP 30 tablet 5  . DULoxetine (CYMBALTA) 30 MG capsule Take 1 capsule (30 mg total) by mouth daily. 90 capsule 1  . FLUoxetine (PROZAC) 20 MG capsule TK 1 C PO DAILY  1  . LINZESS 290 MCG CAPS capsule TAKE 1 CAPSULE BY MOUTH DAILY 90 capsule 3  . traMADol (ULTRAM) 50 MG tablet Take 1 tablet (50 mg total) by mouth every 12 (twelve) hours as needed for severe pain. 60 tablet 5  . 0.9 %  sodium chloride infusion      No facility-administered medications prior to visit.     ROS Review of Systems  Constitutional: Negative.  Negative for chills, fatigue and fever.  HENT: Negative.  Negative for trouble swallowing.   Eyes: Negative.  Negative for visual disturbance.  Respiratory: Negative for cough, chest tightness, shortness of breath and wheezing.   Cardiovascular: Negative for chest  pain, palpitations and leg swelling.  Gastrointestinal: Positive for constipation. Negative for abdominal pain, blood in stool, diarrhea, nausea and vomiting.  Endocrine: Negative.   Genitourinary: Negative.  Negative for difficulty urinating.  Musculoskeletal: Positive for back pain. Negative for myalgias and neck pain.  Skin: Negative.  Negative for rash.  Neurological: Positive for weakness. Negative for dizziness, light-headedness and numbness.  Hematological: Negative for adenopathy. Does not bruise/bleed easily.  Psychiatric/Behavioral: Positive for dysphoric mood. Negative for decreased concentration, self-injury, sleep disturbance and suicidal ideas. The patient is not nervous/anxious.     Objective:  BP 140/80 (BP Location: Left Arm, Patient Position: Sitting, Cuff Size: Normal)   Pulse 78   Temp 97.6 F (36.4 C) (Oral)   Resp 16   Ht  (1.626 m)   Wt 110 lb 4 oz (50 kg)   SpO2 96%   BMI 18.92 kg/m   BP Readings from Last 3 Encounters:  11/16/17 140/80  10/18/17 (!) 117/49  08/30/17 (!) 110/58    Wt Readings from Last 3 Encounters:  11/16/17 110 lb 4 oz (50 kg)  10/18/17 109 lb (49.4 kg)  08/30/17 109 lb (49.4 kg)    Physical Exam  Constitutional: She is oriented to person, place, and time. No distress.  HENT:  Mouth/Throat: Oropharynx is clear and moist. No oropharyngeal exudate.  Eyes: Conjunctivae are normal. No scleral  icterus.  Neck: Normal range of motion. Neck supple. No JVD present. No thyromegaly present.  Cardiovascular: Normal rate, regular rhythm and normal heart sounds.  No murmur heard. Pulmonary/Chest: Effort normal. No stridor. She has no wheezes. She has no rales.  Abdominal: Soft. Normal appearance and bowel sounds are normal. She exhibits no abdominal bruit and no mass. There is no hepatosplenomegaly. There is no tenderness. No hernia.  Musculoskeletal: Normal range of motion. She exhibits no edema, tenderness or deformity.       Thoracic  back: She exhibits pain. She exhibits normal range of motion, no bony tenderness, no swelling, no edema and no deformity.       Lumbar back: She exhibits pain. She exhibits no bony tenderness, no edema, no deformity and no spasm.  Lymphadenopathy:    She has no cervical adenopathy.  Neurological: She is alert and oriented to person, place, and time. She has normal reflexes. She displays atrophy. She displays no tremor. No cranial nerve deficit or sensory deficit. She exhibits abnormal muscle tone. She displays a negative Romberg sign. She displays no seizure activity. Coordination and gait normal.  Neg SLR in BLE  ++ mild bilateral LE weakness and poor tone  Skin: Skin is warm and dry. She is not diaphoretic.  Vitals reviewed.   Lab Results  Component Value Date   WBC 9.1 07/16/2017   HGB 12.9 07/16/2017   HCT 39.1 07/16/2017   PLT 243 07/16/2017   GLUCOSE 122 (H) 07/16/2017   CHOL 193 12/11/2015   TRIG 95.0 12/11/2015   HDL 59.30 12/11/2015   LDLDIRECT 146.5 05/03/2013   LDLCALC 115 (H) 12/11/2015   ALT 10 02/01/2017   AST 23 02/01/2017   NA 143 07/16/2017   K 4.3 07/16/2017   CL 111 07/16/2017   CREATININE 1.15 (H) 07/16/2017   BUN 31 (H) 07/16/2017   CO2 21 (L) 07/16/2017   TSH 1.40 02/01/2017   INR 1.1 (H) 08/13/2010   HGBA1C 5.7 12/05/2014    Dg Chest 2 View  Result Date: 07/16/2017 CLINICAL DATA:  Epigastric pain with cough EXAM: CHEST  2 VIEW COMPARISON:  01/30/2015 FINDINGS: Hyperinflation. No focal pulmonary infiltrate or effusion. Moderate hiatal hernia. Normal heart size. Aortic atherosclerosis. No pneumothorax. Stable compression deformity of lower thoracic vertebra IMPRESSION: Hyperinflation without focal infiltrate.  Moderate hiatal hernia Electronically Signed   By: Jasmine Pang M.D.   On: 07/16/2017 23:08    Assessment & Plan:   Evelyn [name] was seen today for depression and back pain.  Diagnoses and all orders for this visit:  Slow transit constipation- since  Linzess was not effective I have asked her to try a constipation agent that will work on motility. -     Prucalopride Succinate (MOTEGRITY) 2 MG TABS; Take 1 tablet by mouth daily.  Chronic renal disease, stage 3, moderately decreased glomerular filtration rate (GFR) between 30-59 mL/min/1.73 square meter (HCC) -     Discontinue: traMADol (ULTRAM) 50 MG tablet; Take 1 tablet (50 mg total) by mouth every 6 (six) hours as needed for severe pain. -     traMADol (ULTRAM) 50 MG tablet; Take 1 tablet (50 mg total) by mouth every 6 (six) hours as needed for severe pain.  Chronic midline low back pain with sciatica, sciatica laterality unspecified-she wants to undergo another MRI to see if she has a new vertebral fracture or complications from an old vertebral fracture. -     Discontinue: traMADol (ULTRAM) 50 MG tablet; Take 1 tablet (  50 mg total) by mouth every 6 (six) hours as needed for severe pain. -     traMADol (ULTRAM) 50 MG tablet; Take 1 tablet (50 mg total) by mouth every 6 (six) hours as needed for severe pain. -     MR Lumbar Spine Wo Contrast; Future -     MR Thoracic Spine Wo Contrast; Future  Depression with somatization -     FLUoxetine (PROZAC) 20 MG capsule; TK 1 C PO DAILY  Closed wedge compression fracture of twelfth thoracic vertebra with delayed healing, subsequent encounter -     Discontinue: traMADol (ULTRAM) 50 MG tablet; Take 1 tablet (50 mg total) by mouth every 6 (six) hours as needed for severe pain. -     traMADol (ULTRAM) 50 MG tablet; Take 1 tablet (50 mg total) by mouth every 6 (six) hours as needed for severe pain. -     MR Lumbar Spine Wo Contrast; Future -     MR Thoracic Spine Wo Contrast; Future  Chronic midline low back pain without sciatica -     Discontinue: traMADol (ULTRAM) 50 MG tablet; Take 1 tablet (50 mg total) by mouth every 6 (six) hours as needed for severe pain. -     traMADol (ULTRAM) 50 MG tablet; Take 1 tablet (50 mg total) by mouth every 6 (six)  hours as needed for severe pain. -     MR Lumbar Spine Wo Contrast; Future -     MR Thoracic Spine Wo Contrast; Future   I have discontinued Leo Rod and DULoxetine. I am also having her start on Prucalopride Succinate. Additionally, I am having her maintain her triamcinolone, levocetirizine, zolpidem, omeprazole, FLUoxetine, and traMADol. We will stop administering sodium chloride.  Meds ordered this encounter  Medications  . FLUoxetine (PROZAC) 20 MG capsule    Sig: TK 1 C PO DAILY    Dispense:  90 capsule    Refill:  1  . DISCONTD: traMADol (ULTRAM) 50 MG tablet    Sig: Take 1 tablet (50 mg total) by mouth every 6 (six) hours as needed for severe pain.    Dispense:  90 tablet    Refill:  5  . traMADol (ULTRAM) 50 MG tablet    Sig: Take 1 tablet (50 mg total) by mouth every 6 (six) hours as needed for severe pain.    Dispense:  90 tablet    Refill:  5  . Prucalopride Succinate (MOTEGRITY) 2 MG TABS    Sig: Take 1 tablet by mouth daily.    Dispense:  90 tablet    Refill:  1     Follow-up: Return in about 3 months (around 02/16/2018).  Sanda Linger, MD

## 2017-11-16 NOTE — Patient Instructions (Signed)

## 2017-12-03 ENCOUNTER — Ambulatory Visit
Admission: RE | Admit: 2017-12-03 | Discharge: 2017-12-03 | Disposition: A | Discharge status: Home / Self Care | Payer: Medicare Other | Source: Ambulatory Visit | Attending: Internal Medicine | Admitting: Internal Medicine

## 2017-12-03 DIAGNOSIS — S22080G Wedge compression fracture of T11-T12 vertebra, subsequent encounter for fracture with delayed healing: Secondary | ICD-10-CM

## 2017-12-03 DIAGNOSIS — G8929 Other chronic pain: Secondary | ICD-10-CM

## 2017-12-03 DIAGNOSIS — M5440 Lumbago with sciatica, unspecified side: Principal | ICD-10-CM

## 2017-12-03 DIAGNOSIS — M545 Low back pain: Secondary | ICD-10-CM

## 2017-12-03 DIAGNOSIS — M5114 Intervertebral disc disorders with radiculopathy, thoracic region: Secondary | ICD-10-CM | POA: Diagnosis not present

## 2017-12-03 DIAGNOSIS — M48061 Spinal stenosis, lumbar region without neurogenic claudication: Secondary | ICD-10-CM | POA: Diagnosis not present

## 2017-12-19 ENCOUNTER — Telehealth: Payer: Self-pay | Admitting: Internal Medicine

## 2017-12-19 NOTE — Telephone Encounter (Signed)
Copied from CRM [id number]. Topic: Quick Communication - Rx Refill/Question >> Dec 19, 2017  3:06 PM Burchel, Abbi R wrote: Medication: Prucalopride Succinate (MOTEGRITY) 2 MG TABS    [id number] - , [place] - [address] [facility] [address] [facility] [address] [address]  [phone] (Phone) [phone] (Fax)  Pt states samples of this medication worked well and would like a Rx for it.      Agent: Please be advised that RX refills may take up to 3 business days. We ask that you follow-up with your pharmacy.

## 2017-12-20 ENCOUNTER — Other Ambulatory Visit: Payer: Self-pay | Admitting: Internal Medicine

## 2017-12-20 DIAGNOSIS — K5901 Slow transit constipation: Secondary | ICD-10-CM

## 2017-12-20 MED ORDER — PRUCALOPRIDE SUCCINATE 2 MG PO TABS: 1.0000 | ORAL_TABLET | Freq: Every day | ORAL | 1 refills | Status: DC

## 2017-12-20 NOTE — Telephone Encounter (Signed)
Rx request for new medication: Motegrity 2 mg   ( patient was given samples)  LOV: 11/16/17  PCP: Yetta BarreJones  Pharmacy: verified

## 2017-12-27 ENCOUNTER — Telehealth: Payer: Self-pay | Admitting: *Deleted

## 2017-12-27 NOTE — Telephone Encounter (Signed)
PA initiated via CoverMyMeds for Motegrity 2 mg.  Key: [id number]

## 2017-12-27 NOTE — Telephone Encounter (Addendum)
Motegrity PA denied. Please advise.

## 2017-12-28 ENCOUNTER — Encounter: Payer: Self-pay | Admitting: Gastroenterology

## 2017-12-28 ENCOUNTER — Ambulatory Visit (AMBULATORY_SURGERY_CENTER): Payer: Medicare Other | Admitting: Gastroenterology

## 2017-12-28 VITALS — BP 165/73 | HR 64 | Temp 98.7°F | Resp 20 | Ht 64.0 in | Wt 110.0 lb

## 2017-12-28 DIAGNOSIS — K259 Gastric ulcer, unspecified as acute or chronic, without hemorrhage or perforation: Secondary | ICD-10-CM

## 2017-12-28 DIAGNOSIS — R55 Syncope and collapse: Secondary | ICD-10-CM | POA: Diagnosis not present

## 2017-12-28 DIAGNOSIS — K449 Diaphragmatic hernia without obstruction or gangrene: Secondary | ICD-10-CM

## 2017-12-28 DIAGNOSIS — I272 Pulmonary hypertension, unspecified: Secondary | ICD-10-CM | POA: Diagnosis not present

## 2017-12-28 DIAGNOSIS — K219 Gastro-esophageal reflux disease without esophagitis: Secondary | ICD-10-CM | POA: Diagnosis not present

## 2017-12-28 MED ORDER — SODIUM CHLORIDE 0.9 % IV SOLN: 500.0000 mL | Freq: Once | INTRAVENOUS | Status: DC

## 2017-12-28 NOTE — Op Note (Signed)
[facility] Patient Name: Evelyn [name] Procedure Date: 12/28/2017 9:58 AM MRN: [id number] Endoscopist: Rachael Fee , MD Age: 84 Referring MD:  Date of Birth: [birth year] Gender: Female Account #: [id number] Procedure:                Upper GI endoscopy Indications:              Follow-up of gastric ulcer in antrum noted by EGD                            2-3 months ago, gastritis path neg for H. pylori Medicines:                Monitored Anesthesia Care Procedure:                Pre-Anesthesia Assessment:                           - Prior to the procedure, a History and Physical                            was performed, and patient medications and                            allergies were reviewed. The patient's tolerance of                            previous anesthesia was also reviewed. The risks                            and benefits of the procedure and the sedation                            options and risks were discussed with the patient.                            All questions were answered, and informed consent                            was obtained. Prior Anticoagulants: The patient has                            taken no previous anticoagulant or antiplatelet                            agents. ASA Grade Assessment: II - A patient with                            mild systemic disease. After reviewing the risks                            and benefits, the patient was deemed in                            satisfactory condition to undergo the procedure.  After obtaining informed consent, the endoscope was                            passed under direct vision. Throughout the                            procedure, the patient's blood pressure, pulse, and                            oxygen saturations were monitored continuously. The                            Endoscope was introduced through the mouth, and                            advanced  to the second part of duodenum. The upper                            GI endoscopy was accomplished without difficulty.                            The patient tolerated the procedure well. Scope In: Scope Out: Findings:                 A medium-sized hiatal hernia was present.                           The previously noted antral ulcer has healed                            completely. Complications:            No immediate complications. Estimated blood loss:                            None. Estimated Blood Loss:     Estimated blood loss: none. Impression:               - Medium-sized hiatal hernia.                           - The previously noted antral ulcer has healed                            completely.                           - The examination was otherwise normal. Recommendation:           - Patient has a contact number available for                            emergencies. The signs and symptoms of potential                            delayed complications were discussed with the  patient. Return to normal activities tomorrow.                            Written discharge instructions were provided to the                            patient.                           - Resume previous diet.                           - Continue present medications. Twice daily                            ompeprazole. Rachael Fee, MD 12/28/2017 10:10:58 AM This report has been signed electronically.

## 2017-12-28 NOTE — Patient Instructions (Signed)
**  Handout given on hiatal hernia**   YOU HAD AN ENDOSCOPIC PROCEDURE TODAY AT THE [facility]:   Refer to the procedure report that was given to you for any specific questions about what was found during the examination.  If the procedure report does not answer your questions, please call your gastroenterologist to clarify.  If you requested that your care partner not be given the details of your procedure findings, then the procedure report has been included in a sealed envelope for you to review at your convenience later.  YOU SHOULD EXPECT: Some feelings of bloating in the abdomen. Passage of more gas than usual.  Walking can help get rid of the air that was put into your GI tract during the procedure and reduce the bloating. If you had a lower endoscopy (such as a colonoscopy or flexible sigmoidoscopy) you may notice spotting of blood in your stool or on the toilet paper. If you underwent a bowel prep for your procedure, you may not have a normal bowel movement for a few days.  Please Note:  You might notice some irritation and congestion in your nose or some drainage.  This is from the oxygen used during your procedure.  There is no need for concern and it should clear up in a day or so.  SYMPTOMS TO REPORT IMMEDIATELY:    Following upper endoscopy (EGD)  Vomiting of blood or coffee ground material  New chest pain or pain under the shoulder blades  Painful or persistently difficult swallowing  New shortness of breath  Fever of 100F or higher  Black, tarry-looking stools  For urgent or emergent issues, a gastroenterologist can be reached at any hour by calling [phone].   DIET:  We do recommend a small meal at first, but then you may proceed to your regular diet.  Drink plenty of fluids but you should avoid alcoholic beverages for 24 hours.  ACTIVITY:  You should plan to take it easy for the rest of today and you should NOT DRIVE or use heavy machinery until tomorrow  (because of the sedation medicines used during the test).    FOLLOW UP: Our staff will call the number listed on your records the next business day following your procedure to check on you and address any questions or concerns that you may have regarding the information given to you following your procedure. If we do not reach you, we will leave a message.  However, if you are feeling well and you are not experiencing any problems, there is no need to return our call.  We will assume that you have returned to your regular daily activities without incident.  If any biopsies were taken you will be contacted by phone or by letter within the next 1-3 weeks.  Please call us at [phone] if you have not heard about the biopsies in 3 weeks.    SIGNATURES/CONFIDENTIALITY: You and/or your care partner have signed paperwork which will be entered into your electronic medical record.  These signatures attest to the fact that that the information above on your After Visit Summary has been reviewed and is understood.  Full responsibility of the confidentiality of this discharge information lies with you and/or your care-partner. 

## 2017-12-28 NOTE — Progress Notes (Signed)
Report to PACU, RN, vss, BBS= Clear.  

## 2017-12-28 NOTE — Progress Notes (Signed)
Patient stating she is here to check healing of her ulcer, denies trouble with swallowing. Patient will need a refill on omeprazole. Patient continues to have problems with constipation, discussed with Dr. Christella HartiganJacobs.

## 2017-12-28 NOTE — Progress Notes (Signed)
Patient took ambien at 0400 this am.

## 2017-12-29 ENCOUNTER — Telehealth: Payer: Self-pay | Admitting: *Deleted

## 2017-12-29 NOTE — Telephone Encounter (Signed)
  Follow up Call-  Call back number 12/28/2017 10/18/2017  Post procedure Call Back phone  # [phone]  Permission to leave phone message Yes Yes  Some recent data might be hidden     Patient questions:  Do you have a fever, pain , or abdominal swelling? No. Pain Score  0 *  Have you tolerated food without any problems? Yes.    Have you been able to return to your normal activities? Yes.    Do you have any questions about your discharge instructions: Diet   No. Medications  No. Follow up visit  No.  Do you have questions or concerns about your Care? No.  Actions: * If pain score is 4 or above: No action needed, pain <4.

## 2018-01-09 ENCOUNTER — Ambulatory Visit (HOSPITAL_COMMUNITY)
Admission: EM | Admit: 2018-01-09 | Discharge: 2018-01-09 | Disposition: A | Discharge status: Home / Self Care | Payer: Medicare Other | Attending: Family Medicine | Admitting: Family Medicine

## 2018-01-09 ENCOUNTER — Encounter (HOSPITAL_COMMUNITY): Payer: Self-pay | Admitting: Emergency Medicine

## 2018-01-09 DIAGNOSIS — Z79899 Other long term (current) drug therapy: Secondary | ICD-10-CM | POA: Insufficient documentation

## 2018-01-09 DIAGNOSIS — G8929 Other chronic pain: Secondary | ICD-10-CM | POA: Insufficient documentation

## 2018-01-09 DIAGNOSIS — K589 Irritable bowel syndrome without diarrhea: Secondary | ICD-10-CM | POA: Insufficient documentation

## 2018-01-09 DIAGNOSIS — Z87440 Personal history of urinary (tract) infections: Secondary | ICD-10-CM | POA: Insufficient documentation

## 2018-01-09 DIAGNOSIS — E785 Hyperlipidemia, unspecified: Secondary | ICD-10-CM | POA: Diagnosis not present

## 2018-01-09 DIAGNOSIS — G4700 Insomnia, unspecified: Secondary | ICD-10-CM | POA: Insufficient documentation

## 2018-01-09 DIAGNOSIS — F329 Major depressive disorder, single episode, unspecified: Secondary | ICD-10-CM | POA: Diagnosis not present

## 2018-01-09 DIAGNOSIS — R21 Rash and other nonspecific skin eruption: Secondary | ICD-10-CM

## 2018-01-09 DIAGNOSIS — N183 Chronic kidney disease, stage 3 (moderate): Secondary | ICD-10-CM | POA: Insufficient documentation

## 2018-01-09 DIAGNOSIS — M545 Low back pain: Secondary | ICD-10-CM | POA: Diagnosis not present

## 2018-01-09 LAB — COMPREHENSIVE METABOLIC PANEL
ALT: 11 U/L (ref 0–44)
AST: 27 U/L (ref 15–41)
Albumin: 3.8 g/dL (ref 3.5–5.0)
Alkaline Phosphatase: 94 U/L (ref 38–126)
Anion gap: 11 (ref 5–15)
BILIRUBIN TOTAL: 0.5 mg/dL (ref 0.3–1.2)
BUN: 24 mg/dL — AB (ref 8–23)
CHLORIDE: 108 mmol/L (ref 98–111)
CO2: 21 mmol/L — ABNORMAL LOW (ref 22–32)
Calcium: 9.6 mg/dL (ref 8.9–10.3)
Creatinine, Ser: 1.04 mg/dL — ABNORMAL HIGH (ref 0.44–1.00)
GFR, EST AFRICAN AMERICAN: 53 mL/min — AB (ref >60)
GFR, EST NON AFRICAN AMERICAN: 46 mL/min — AB (ref >60)
Glucose, Bld: 85 mg/dL (ref 70–99)
POTASSIUM: 4.2 mmol/L (ref 3.5–5.1)
Sodium: 140 mmol/L (ref 135–145)
TOTAL PROTEIN: 7.7 g/dL (ref 6.5–8.1)

## 2018-01-09 LAB — CBC WITH DIFFERENTIAL/PLATELET
Abs Immature Granulocytes: 0.1 10*3/uL (ref 0.0–0.1)
BASOS PCT: 0 %
Basophils Absolute: 0 10*3/uL (ref 0.0–0.1)
EOS ABS: 0.1 10*3/uL (ref 0.0–0.7)
EOS PCT: 1 %
HEMATOCRIT: 41.6 % (ref 36.0–46.0)
Hemoglobin: 12.8 g/dL (ref 12.0–15.0)
Immature Granulocytes: 1 %
LYMPHS ABS: 1.7 10*3/uL (ref 0.7–4.0)
Lymphocytes Relative: 19 %
MCH: 29 pg (ref 26.0–34.0)
MCHC: 30.8 g/dL (ref 30.0–36.0)
MCV: 94.1 fL (ref 78.0–100.0)
MONO ABS: 0.9 10*3/uL (ref 0.1–1.0)
MONOS PCT: 10 %
Neutro Abs: 6.3 10*3/uL (ref 1.7–7.7)
Neutrophils Relative %: 69 %
PLATELETS: 287 10*3/uL (ref 150–400)
RBC: 4.42 MIL/uL (ref 3.87–5.11)
RDW: 13.7 % (ref 11.5–15.5)
WBC: 9.1 10*3/uL (ref 4.0–10.5)

## 2018-01-09 LAB — PROTIME-INR
INR: 1.03
PROTHROMBIN TIME: 13.4 s (ref 11.4–15.2)

## 2018-01-09 NOTE — ED Provider Notes (Signed)
[facility]    CSN: [id number] Arrival date & time: 01/09/18  4098     History   Chief Complaint Chief Complaint  Patient presents with  . Rash    HPI Evelyn [name] is a 84 y.o. female.   Patient is a pleasant 84 year old female who presents with brown spots to left hand/wrist and forehead that appeared overnight.  She states they look very similar to her age spots but worried because they appeared so fast.  She denies any itching, fevers, chills, fatigue, body aches.  She denies being bitten by any insects or using any new soaps or lotions.  She denies being outside around any plants or any recent traveling out of the country.  She is here with her son and he is concerned and would like some blood work drawn to check kidney, liver function and other things.   ROS per HPI      Past Medical History:  Diagnosis Date  . Allergy   . Arthritis   . Chronic cystitis   . Congenital blindness    right eye  . Depression   . H/O: hematuria   . Hepatitis, unspecified    statin related, resolved quickly  . Hyperlipidemia   . Legally blind in right eye, as defined in [place]   . Syncope and collapse 04/01/2014  . UTI (lower urinary tract infection)    HISTORY OF UTI    Patient Active Problem List   Diagnosis Date Noted  . Closed wedge compression fracture of T12 vertebra (HCC) 11/24/2016  . Chronic midline low back pain 11/18/2016  . Chronic renal disease, stage 3, moderately decreased glomerular filtration rate (GFR) between 30-59 mL/min/1.73 square meter (HCC) 11/18/2016  . IBS (irritable bowel syndrome)   . Routine general medical examination at a health care facility 05/03/2013  . Constipation 03/31/2012  . Other screening mammogram 03/24/2011  . Depression with somatization 03/06/2010  . Insomnia 03/08/2008  . Hyperlipidemia with target LDL less than 130 02/21/2007    Past Surgical History:  Procedure Laterality Date  . ABDOMINAL HYSTERECTOMY     vaginal    . BLADDER SUSPENSION     A-P  . CATARACT EXTRACTION W/ INTRAOCULAR LENS IMPLANT Left   . OPEN REDUCTION INTERNAL FIXATION (ORIF) DISTAL RADIAL FRACTURE Right 03/09/2016   Procedure: OPEN REDUCTION INTERNAL FIXATION (ORIF) DISTAL RADIAL FRACTURE;  Surgeon: Betha Loa, MD;  Location: [facility];  Service: Orthopedics;  Laterality: Right;    OB History   None      Home Medications    Prior to Admission medications   Medication Sig Start Date End Date Taking? Authorizing Provider  FLUoxetine (PROZAC) 20 MG capsule TK 1 C PO DAILY 11/16/17   Etta Grandchild, MD  levocetirizine (XYZAL) 5 MG tablet Take 1 tablet (5 mg total) by mouth every evening. Patient not taking: Reported on 12/28/2017 02/01/17   Etta Grandchild, MD  omeprazole (PRILOSEC) 40 MG capsule Take 1 capsule (40 mg total) by mouth 2 (two) times daily. 10/25/17   Rachael Fee, MD  Prucalopride Succinate (MOTEGRITY) 2 MG TABS Take 1 tablet by mouth daily. Patient not taking: Reported on 12/28/2017 12/20/17   Etta Grandchild, MD  traMADol (ULTRAM) 50 MG tablet Take 1 tablet (50 mg total) by mouth every 6 (six) hours as needed for severe pain. 11/16/17   Etta Grandchild, MD  triamcinolone (NASACORT) 55 MCG/ACT AERO nasal inhaler Place 4 sprays into the nose daily.  Patient not taking: Reported on 12/28/2017 02/01/17   Etta GrandchildJones, Thomas L, MD  zolpidem (AMBIEN) 10 MG tablet TAKE 0.5 TABLET BY MOUTH EVERY NIGHT AT BEDTIME AS NEEDED FOR SLEEP 07/26/17   Etta GrandchildJones, Thomas L, MD    Family History Family History  Problem Relation Age of Onset  . Cancer Neg Hx        Colon cancer  . Stroke Neg Hx   . Heart disease Neg Hx   . Hyperlipidemia Neg Hx   . Hypertension Neg Hx   . Kidney disease Neg Hx   . Colon polyps Neg Hx   . Colon cancer Neg Hx     Social History Social History   Tobacco Use  . Smoking status: Never Smoker  . Smokeless tobacco: Never Used  Substance Use Topics  . Alcohol use: No  . Drug use: No      Allergies   Patient has no known allergies.   Review of Systems Review of Systems   Physical Exam Triage Vital Signs ED Triage Vitals  Enc Vitals Group     BP 01/09/18 0948 (!) 144/75     Pulse Rate 01/09/18 0948 79     Resp 01/09/18 0948 18     Temp 01/09/18 0948 97.9 F (36.6 C)     Temp Source 01/09/18 0948 Oral     SpO2 01/09/18 0948 99 %     Weight 01/09/18 0950 110 lb (49.9 kg)     Height --      Head Circumference --      Peak Flow --      Pain Score 01/09/18 0950 0     Pain Loc --      Pain Edu? --      Excl. in GC? --    No data found.  Updated Vital Signs BP (!) 144/75 (BP Location: Left Arm)   Pulse 79   Temp 97.9 F (36.6 C) (Oral)   Resp 18   Wt 110 lb (49.9 kg)   SpO2 99%   BMI 18.88 kg/m   Visual Acuity Right Eye Distance:   Left Eye Distance:   Bilateral Distance:    Right Eye Near:   Left Eye Near:    Bilateral Near:     Physical Exam  Constitutional: She is oriented to person, place, and time. She appears well-developed and well-nourished.  HENT:  Head: Normocephalic and atraumatic.  Neck: Normal range of motion.  Pulmonary/Chest: Effort normal.  Lymphadenopathy:    She has no cervical adenopathy.  Neurological: She is alert and oriented to person, place, and time.  Skin: Skin is warm and dry. Capillary refill takes less than 2 seconds.  Flat brown spots noticed throughout entire body. More prominent on arms and hands. No erythema or pustules. No edema, ecchymosis, purpura or petechiae.  Psychiatric: She has a normal mood and affect.  Nursing note reviewed.          UC Treatments / Results  Labs (all labs ordered are listed, but only abnormal results are displayed) Labs Reviewed  COMPREHENSIVE METABOLIC PANEL - Abnormal; Notable for the following components:      Result Value   CO2 21 (*)    BUN 24 (*)    Creatinine, Ser 1.04 (*)    GFR calc non Af Amer 46 (*)    GFR calc Af Amer 53 (*)    All other  components within normal limits  CBC WITH DIFFERENTIAL/PLATELET  PROTIME-INR  EKG None  Radiology No results found.  Procedures Procedures (including critical care time)  Medications Ordered in UC Medications - No data to display  Initial Impression / Assessment and Plan / UC Course  I have reviewed the triage vital signs and the nursing notes.  Pertinent labs & imaging results that were available during my care of the patient were reviewed by me and considered in my medical decision making (see chart for details).     All lab work was completely normal.  Call patient at home to give results.  Reassured her that this is unlikely anything that is worrisome or life-threatening.  Most likely age spots.  If she starts seeing more increasing spots or develops any worsening symptoms like fever, chills, body aches, itching or irritation she will need to follow-up with her doctor.  Patient very happy and agreeable to plan. Final Clinical Impressions(s) / UC Diagnoses   Final diagnoses:  Rash and nonspecific skin eruption     Discharge Instructions     It was nice meeting you!!  We are getting some blood work to check a few things.  We will call you with the results.  Follow up with your doctor if this progresses.     ED Prescriptions    None     Controlled Substance Prescriptions  Controlled Substance Registry consulted? Not Applicable   Janace Aris, NP 01/09/18 1722

## 2018-01-09 NOTE — Discharge Instructions (Signed)
It was nice meeting you!!  We are getting some blood work to check a few things.  We will call you with the results.  Follow up with your doctor if this progresses.

## 2018-01-09 NOTE — ED Triage Notes (Signed)
PT reports brown spots on arms and face that came on overnight 3-4 days ago.

## 2018-01-09 NOTE — Telephone Encounter (Signed)
The appeal for [employer] has been denied.  We can submit for an independent contractor for CMS to review if you approve.   Please advise.

## 2018-01-09 NOTE — Telephone Encounter (Signed)
Yes, please.

## 2018-02-09 DIAGNOSIS — L820 Inflamed seborrheic keratosis: Secondary | ICD-10-CM | POA: Diagnosis not present

## 2018-02-09 DIAGNOSIS — Z85828 Personal history of other malignant neoplasm of skin: Secondary | ICD-10-CM | POA: Diagnosis not present

## 2018-02-09 DIAGNOSIS — L821 Other seborrheic keratosis: Secondary | ICD-10-CM | POA: Diagnosis not present

## 2018-02-09 DIAGNOSIS — L814 Other melanin hyperpigmentation: Secondary | ICD-10-CM | POA: Diagnosis not present

## 2018-03-18 ENCOUNTER — Other Ambulatory Visit: Payer: Self-pay | Admitting: Internal Medicine

## 2018-03-18 DIAGNOSIS — F5101 Primary insomnia: Secondary | ICD-10-CM

## 2018-04-11 DIAGNOSIS — Z23 Encounter for immunization: Secondary | ICD-10-CM | POA: Diagnosis not present

## 2018-04-26 DIAGNOSIS — H31001 Unspecified chorioretinal scars, right eye: Secondary | ICD-10-CM | POA: Diagnosis not present

## 2018-04-26 DIAGNOSIS — H52202 Unspecified astigmatism, left eye: Secondary | ICD-10-CM | POA: Diagnosis not present

## 2018-05-10 ENCOUNTER — Other Ambulatory Visit: Payer: Self-pay

## 2018-07-13 ENCOUNTER — Other Ambulatory Visit: Payer: Self-pay | Admitting: Internal Medicine

## 2018-07-13 DIAGNOSIS — F450 Somatization disorder: Principal | ICD-10-CM

## 2018-07-13 DIAGNOSIS — F32A Depression, unspecified: Secondary | ICD-10-CM

## 2018-07-13 DIAGNOSIS — F329 Major depressive disorder, single episode, unspecified: Secondary | ICD-10-CM

## 2018-08-24 NOTE — Progress Notes (Addendum)
Subjective:   Evelyn [name] is a 85 y.o. female who presents for Medicare Annual (Subsequent) preventive examination.  Review of Systems:  No ROS.  Medicare Wellness Visit. Additional risk factors are reflected in the social history.  Cardiac Risk Factors include: advanced age (>66men, >80 women);dyslipidemia Sleep patterns: feels rested on waking, gets up 1-2 times nightly to void and sleeps 7 hours nightly.    Home Safety/Smoke Alarms: Feels safe in home. Smoke alarms in place.  Living environment; residence and Firearm Safety: 1-story house/ trailer. Lives alone, no needs for DME, good support system Seat Belt Safety/Bike Helmet: Wears seat belt.      Objective:     Vitals: BP 132/70   Pulse 64   Resp 17   Ht 5\' 4"  (1.626 m)   Wt 105 lb (47.6 kg)   SpO2 99%   BMI 18.02 kg/m   Body mass index is 18.02 kg/m.  Advanced Directives 08/28/2018 12/28/2017 07/16/2017 03/09/2016 03/08/2016 01/30/2015 01/30/2015  Does Patient Have a Medical Advance Directive? Yes Yes No Yes Yes Yes No  Type of Estate agent of [place];Living will Healthcare Power of [place];Living will - Healthcare Power of [place];Living will Healthcare Power of [place];Living will - -  Does patient want to make changes to medical advance directive? - - - No - Patient declined - No - Patient declined -  Copy of Healthcare Power of Attorney in Chart? No - copy requested - - No - copy requested - No - copy requested -  Would patient like information on creating a medical advance directive? - - - - - - -    Tobacco Social History   Tobacco Use  Smoking Status Never Smoker  Smokeless Tobacco Never Used     Counseling given: Not Answered  Past Medical History:  Diagnosis Date  . Allergy   . Arthritis   . Chronic cystitis   . Congenital blindness    right eye  . Depression   . H/O: hematuria   . Hepatitis, unspecified    statin related, resolved quickly  . Hyperlipidemia   . Legally  blind in right eye, as defined in [place]   . Syncope and collapse 04/01/2014  . UTI (lower urinary tract infection)    HISTORY OF UTI   Past Surgical History:  Procedure Laterality Date  . ABDOMINAL HYSTERECTOMY     vaginal  . BLADDER SUSPENSION     A-P  . CATARACT EXTRACTION W/ INTRAOCULAR LENS IMPLANT Left   . OPEN REDUCTION INTERNAL FIXATION (ORIF) DISTAL RADIAL FRACTURE Right 03/09/2016   Procedure: OPEN REDUCTION INTERNAL FIXATION (ORIF) DISTAL RADIAL FRACTURE;  Surgeon: Betha Loa, MD;  Location: [facility];  Service: Orthopedics;  Laterality: Right;   Family History  Problem Relation Age of Onset  . Cancer Neg Hx        Colon cancer  . Stroke Neg Hx   . Heart disease Neg Hx   . Hyperlipidemia Neg Hx   . Hypertension Neg Hx   . Kidney disease Neg Hx   . Colon polyps Neg Hx   . Colon cancer Neg Hx    Social History   Socioeconomic History  . Marital status: Widowed    Spouse name: Not on file  . Number of children: Not on file  . Years of education: Not on file  . Highest education level: Not on file  Occupational History  . Occupation: Retired  Engineer, production  . Financial resource strain:  Not hard at all  . Food insecurity:    Worry: Never true    Inability: Never true  . Transportation needs:    Medical: No    Non-medical: No  Tobacco Use  . Smoking status: Never Smoker  . Smokeless tobacco: Never Used  Substance and Sexual Activity  . Alcohol use: No  . Drug use: No  . Sexual activity: Not Currently  Lifestyle  . Physical activity:    Days per week: 0 days    Minutes per session: 0 min  . Stress: Not at all  Relationships  . Social connections:    Talks on phone: More than three times a week    Gets together: More than three times a week    Attends religious service: 1 to 4 times per year    Active member of club or organization: No    Attends meetings of clubs or organizations: Never    Relationship status: Widowed  Other Topics  Concern  . Not on file  Social History Narrative  . Not on file    Outpatient Encounter Medications as of 08/28/2018  Medication Sig  . FLUoxetine (PROZAC) 20 MG capsule TAKE 1 CAPSULE BY MOUTH DAILY  . traMADol (ULTRAM) 50 MG tablet Take 1 tablet (50 mg total) by mouth every 6 (six) hours as needed for severe pain.  Marland Kitchen zolpidem (AMBIEN) 10 MG tablet TAKE 1/2 TABLET BY MOUTH EVERY NIGHT AT BEDTIME AS NEEDED FOR SLEEP  . [DISCONTINUED] levocetirizine (XYZAL) 5 MG tablet Take 1 tablet (5 mg total) by mouth every evening. (Patient not taking: Reported on 12/28/2017)  . [DISCONTINUED] omeprazole (PRILOSEC) 40 MG capsule Take 1 capsule (40 mg total) by mouth 2 (two) times daily. (Patient not taking: Reported on 08/28/2018)  . [DISCONTINUED] Prucalopride Succinate (MOTEGRITY) 2 MG TABS Take 1 tablet by mouth daily. (Patient not taking: Reported on 12/28/2017)  . [DISCONTINUED] triamcinolone (NASACORT) 55 MCG/ACT AERO nasal inhaler Place 4 sprays into the nose daily. (Patient not taking: Reported on 12/28/2017)   Facility-Administered Encounter Medications as of 08/28/2018  Medication  . 0.9 %  sodium chloride infusion    Activities of Daily Living In your present state of health, do you have any difficulty performing the following activities: 08/28/2018  Hearing? N  Vision? Y  Comment blind right eye  Difficulty concentrating or making decisions? N  Walking or climbing stairs? N  Dressing or bathing? N  Doing errands, shopping? N  Preparing Food and eating ? N  Using the Toilet? N  In the past six months, have you accidently leaked urine? N  Do you have problems with loss of bowel control? N  Managing your Medications? N  Managing your Finances? N  Housekeeping or managing your Housekeeping? N  Some recent data might be hidden    Patient Care Team: Etta Grandchild, MD as PCP - General Rachael Fee, MD as Attending Physician (Gastroenterology)    Assessment:   This is a routine wellness  examination for [name]. Physical assessment deferred to PCP.  Exercise Activities and Dietary recommendations Current Exercise Habits: The patient does not participate in regular exercise at present Diet (meal preparation, eat out, water intake, caffeinated beverages, dairy products, fruits and vegetables): in general, a "healthy" diet   Reports poor appetite at times.   Reviewed heart healthy diet.  Discussed supplementing with Ensure, samples and coupons provided. Encouraged patient to increase daily water and healthy fluid intake.  Goals    . Patient  Stated     Maintain current health status and independence.       Fall Risk Fall Risk  08/28/2018 05/10/2018 05/28/2016 12/09/2014 05/03/2013  Falls in the past year? 0 0 Yes No No  Comment - Emmi Telephone Survey: data to providers prior to load Emmi Telephone Survey: data to providers prior to load - -  Number falls in past yr: - - 1 - -  Comment - - Emmi Telephone Survey Actual Response = 1 - -  Injury with Fall? - - Yes - -  Risk for fall due to : Impaired balance/gait - - - -  Follow up Falls prevention discussed - - - -    Depression Screen PHQ 2/9 Scores 08/28/2018 12/09/2014 05/03/2013 05/03/2013  PHQ - 2 Score 0  PHQ- 9 Score -     Cognitive Function MMSE - Mini Mental State Exam 08/28/2018  Orientation to time 5  Orientation to Place 5  Registration 3  Attention/ Calculation 5  Recall 1  Language- name 2 objects 2  Language- repeat 1  Language- follow 3 step command 3  Language- read & follow direction 1  Write a sentence 1  Copy design 1  Total score 28        Immunization History  Administered Date(s) Administered  . Influenza Whole 03/22/2012  . Influenza, High Dose Seasonal PF 05/03/2013, 03/22/2016  . Influenza-Unspecified 03/26/2014, 04/22/2015, 04/12/2018  . Pneumococcal Conjugate-13 05/03/2013  . Pneumococcal Polysaccharide-23 07/06/2016  . Td 08/13/2010  . Zoster Recombinat (Shingrix)  05/11/2018, 07/18/2018   Screening Tests Health Maintenance  Topic Date Due  . TETANUS/TDAP  08/13/2020  . INFLUENZA VACCINE  Completed  . DEXA SCAN  Completed  . PNA vac Low Risk Adult  Completed      Plan:    Reviewed health maintenance screenings with patient today and relevant education, vaccines, and/or referrals were provided.   Continue to eat heart healthy diet (full of fruits, vegetables, whole grains, lean protein, water--limit salt, fat, and sugar intake) and increase physical activity as tolerated.  Continue doing brain stimulating activities (puzzles, reading, adult coloring books, staying active) to keep memory sharp.   I have personally reviewed and noted the following in the patient's chart:   . Medical and social history . Use of alcohol, tobacco or illicit drugs  . Current medications and supplements . Functional ability and status . Nutritional status . Physical activity . Advanced directives . List of other physicians . Vitals . Screenings to include cognitive, depression, and falls . Referrals and appointments  In addition, I have reviewed and discussed with patient certain preventive protocols, quality metrics, and best practice recommendations. A written personalized care plan for preventive services as well as general preventive health recommendations were provided to patient.     Wanda Plump, RN  08/28/2018  Medical screening examination/treatment/procedure(s) were performed by non-physician practitioner and as supervising physician I was immediately available for consultation/collaboration. I agree with above. Sanda Linger, MD

## 2018-08-28 ENCOUNTER — Ambulatory Visit (INDEPENDENT_AMBULATORY_CARE_PROVIDER_SITE_OTHER): Payer: Medicare Other | Admitting: *Deleted

## 2018-08-28 VITALS — BP 132/70 | HR 64 | Resp 17 | Ht 64.0 in | Wt 105.0 lb

## 2018-08-28 DIAGNOSIS — Z0000 Encounter for general adult medical examination without abnormal findings: Secondary | ICD-10-CM

## 2018-08-28 NOTE — Patient Instructions (Addendum)
Continue doing brain stimulating activities (puzzles, reading, adult coloring books, staying active) to keep memory sharp.   Just1Navigator - A Free Guide To Your Care Strategy As people age and find they need some extra help and care, they - or their families - often don't know what the best approach to care is for their situation. Is transportation needed? Or would it be helpful to have someone assist with errand-running? What about challenges with bathing or cooking? Is an adult day program ideal, or would home care be the best solution?  This decision-making process can often prove confusing and very emotional. That's why [facility] created the Just1Navigator program, a free service to help individuals and families through the journey of determining care for older adults.  The "Navigator" is a Education officer, museum, who will meet with a prospective client and/or loved ones to provide an assessment of the situation and a set of recommendations for a personalized care plan - all free of charge, and whether [facility] offers the needed service or not. If the need is not a service we provide, we are well-connected with reputable programs in town that we can refer you to.  To speak with the Navigator - Arnell Asal - call [phone].  Continue to eat heart healthy diet (full of fruits, vegetables, whole grains, lean protein, water--limit salt, fat, and sugar intake) and increase physical activity as tolerated.   Evelyn [name] , Thank you for taking time to come for your Medicare Wellness Visit. I appreciate your ongoing commitment to your health goals. Please review the following plan we discussed and let me know if I can assist you in the future.   These are the goals we discussed: Goals    . Patient Stated     Maintain current health status and independence.       This is a list of the screening recommended for you and due dates:  Health Maintenance  Topic Date Due  .  Tetanus Vaccine  08/13/2020  . Flu Shot  Completed  . DEXA scan (bone density measurement)  Completed  . Pneumonia vaccines  Completed   Health Maintenance, Female Adopting a healthy lifestyle and getting preventive care can go a long way to promote health and wellness. Talk with your health care provider about what schedule of regular examinations is right for you. This is a good chance for you to check in with your provider about disease prevention and staying healthy. In between checkups, there are plenty of things you can do on your own. Experts have done a lot of research about which lifestyle changes and preventive measures are most likely to keep you healthy. Ask your health care provider for more information. Weight and diet Eat a healthy diet  Be sure to include plenty of vegetables, fruits, low-fat dairy products, and lean protein.  Do not eat a lot of foods high in solid fats, added sugars, or salt.  Get regular exercise. This is one of the most important things you can do for your health. ? Most adults should exercise for at least 150 minutes each week. The exercise should increase your heart rate and make you sweat (moderate-intensity exercise). ? Most adults should also do strengthening exercises at least twice a week. This is in addition to the moderate-intensity exercise. Maintain a healthy weight  Body mass index (BMI) is a measurement that can be used to identify possible weight problems. It estimates body fat based on height and weight. Your health care  provider can help determine your BMI and help you achieve or maintain a healthy weight.  For females 12 years of age and older: ? A BMI below 18.5 is considered underweight. ? A BMI of 18.5 to 24.9 is normal. ? A BMI of 25 to 29.9 is considered overweight. ? A BMI of 30 and above is considered obese. Watch levels of cholesterol and blood lipids  You should start having your blood tested for lipids and cholesterol at 83  years of age, then have this test every 5 years.  You may need to have your cholesterol levels checked more often if: ? Your lipid or cholesterol levels are high. ? You are older than 83 years of age. ? You are at high risk for heart disease. Cancer screening Lung Cancer  Lung cancer screening is recommended for adults 80-36 years old who are at high risk for lung cancer because of a history of smoking.  A yearly low-dose CT scan of the lungs is recommended for people who: ? Currently smoke. ? Have quit within the past 15 years. ? Have at least a 30-pack-year history of smoking. A pack year is smoking an average of one pack of cigarettes a day for 1 year.  Yearly screening should continue until it has been 15 years since you quit.  Yearly screening should stop if you develop a health problem that would prevent you from having lung cancer treatment. Breast Cancer  Practice breast self-awareness. This means understanding how your breasts normally appear and feel.  It also means doing regular breast self-exams. Let your health care provider know about any changes, no matter how small.  If you are in your 20s or 30s, you should have a clinical breast exam (CBE) by a health care provider every 1-3 years as part of a regular health exam.  If you are 62 or older, have a CBE every year. Also consider having a breast X-ray (mammogram) every year.  If you have a family history of breast cancer, talk to your health care provider about genetic screening.  If you are at high risk for breast cancer, talk to your health care provider about having an MRI and a mammogram every year.  Breast cancer gene (BRCA) assessment is recommended for women who have family members with BRCA-related cancers. BRCA-related cancers include: ? Breast. ? Ovarian. ? Tubal. ? Peritoneal cancers.  Results of the assessment will determine the need for genetic counseling and BRCA1 and BRCA2 testing. Cervical  Cancer Your health care provider may recommend that you be screened regularly for cancer of the pelvic organs (ovaries, uterus, and vagina). This screening involves a pelvic examination, including checking for microscopic changes to the surface of your cervix (Pap test). You may be encouraged to have this screening done every 3 years, beginning at age 1.  For women ages 40-65, health care providers may recommend pelvic exams and Pap testing every 3 years, or they may recommend the Pap and pelvic exam, combined with testing for human papilloma virus (HPV), every 5 years. Some types of HPV increase your risk of cervical cancer. Testing for HPV may also be done on women of any age with unclear Pap test results.  Other health care providers may not recommend any screening for nonpregnant women who are considered low risk for pelvic cancer and who do not have symptoms. Ask your health care provider if a screening pelvic exam is right for you.  If you have had past treatment for cervical  cancer or a condition that could lead to cancer, you need Pap tests and screening for cancer for at least 20 years after your treatment. If Pap tests have been discontinued, your risk factors (such as having a new sexual partner) need to be reassessed to determine if screening should resume. Some women have medical problems that increase the chance of getting cervical cancer. In these cases, your health care provider may recommend more frequent screening and Pap tests. Colorectal Cancer  This type of cancer can be detected and often prevented.  Routine colorectal cancer screening usually begins at 83 years of age and continues through 83 years of age.  Your health care provider may recommend screening at an earlier age if you have risk factors for colon cancer.  Your health care provider may also recommend using home test kits to check for hidden blood in the stool.  A small camera at the end of a tube can be used to  examine your colon directly (sigmoidoscopy or colonoscopy). This is done to check for the earliest forms of colorectal cancer.  Routine screening usually begins at age 68.  Direct examination of the colon should be repeated every 5-10 years through 83 years of age. However, you may need to be screened more often if early forms of precancerous polyps or small growths are found. Skin Cancer  Check your skin from head to toe regularly.  Tell your health care provider about any new moles or changes in moles, especially if there is a change in a mole's shape or color.  Also tell your health care provider if you have a mole that is larger than the size of a pencil eraser.  Always use sunscreen. Apply sunscreen liberally and repeatedly throughout the day.  Protect yourself by wearing long sleeves, pants, a wide-brimmed hat, and sunglasses whenever you are outside. Heart disease, diabetes, and high blood pressure  High blood pressure causes heart disease and increases the risk of stroke. High blood pressure is more likely to develop in: ? People who have blood pressure in the high end of the normal range (130-139/85-89 mm Hg). ? People who are overweight or obese. ? People who are African American.  If you are 32-71 years of age, have your blood pressure checked every 3-5 years. If you are 58 years of age or older, have your blood pressure checked every year. You should have your blood pressure measured twice-once when you are at a hospital or clinic, and once when you are not at a hospital or clinic. Record the average of the two measurements. To check your blood pressure when you are not at a hospital or clinic, you can use: ? An automated blood pressure machine at a pharmacy. ? A home blood pressure monitor.  If you are between 42 years and 76 years old, ask your health care provider if you should take aspirin to prevent strokes.  Have regular diabetes screenings. This involves taking a blood  sample to check your fasting blood sugar level. ? If you are at a normal weight and have a low risk for diabetes, have this test once every three years after 83 years of age. ? If you are overweight and have a high risk for diabetes, consider being tested at a younger age or more often. Preventing infection Hepatitis B  If you have a higher risk for hepatitis B, you should be screened for this virus. You are considered at high risk for hepatitis B if: ? You  were born in a country where hepatitis B is common. Ask your health care provider which countries are considered high risk. ? Your parents were born in a high-risk country, and you have not been immunized against hepatitis B (hepatitis B vaccine). ? You have HIV or AIDS. ? You use needles to inject street drugs. ? You live with someone who has hepatitis B. ? You have had sex with someone who has hepatitis B. ? You get hemodialysis treatment. ? You take certain medicines for conditions, including cancer, organ transplantation, and autoimmune conditions. Hepatitis C  Blood testing is recommended for: ? Everyone born from [birth year] through 1963. ? Anyone with known risk factors for hepatitis C. Sexually transmitted infections (STIs)  You should be screened for sexually transmitted infections (STIs) including gonorrhea and chlamydia if: ? You are sexually active and are younger than 83 years of age. ? You are older than 83 years of age and your health care provider tells you that you are at risk for this type of infection. ? Your sexual activity has changed since you were last screened and you are at an increased risk for chlamydia or gonorrhea. Ask your health care provider if you are at risk.  If you do not have HIV, but are at risk, it may be recommended that you take a prescription medicine daily to prevent HIV infection. This is called pre-exposure prophylaxis (PrEP). You are considered at risk if: ? You are sexually active and do not  regularly use condoms or know the HIV status of your partner(s). ? You take drugs by injection. ? You are sexually active with a partner who has HIV. Talk with your health care provider about whether you are at high risk of being infected with HIV. If you choose to begin PrEP, you should first be tested for HIV. You should then be tested every 3 months for as long as you are taking PrEP. Pregnancy  If you are premenopausal and you may become pregnant, ask your health care provider about preconception counseling.  If you may become pregnant, take 400 to 800 micrograms (mcg) of folic acid every day.  If you want to prevent pregnancy, talk to your health care provider about birth control (contraception). Osteoporosis and menopause  Osteoporosis is a disease in which the bones lose minerals and strength with aging. This can result in serious bone fractures. Your risk for osteoporosis can be identified using a bone density scan.  If you are 34 years of age or older, or if you are at risk for osteoporosis and fractures, ask your health care provider if you should be screened.  Ask your health care provider whether you should take a calcium or vitamin D supplement to lower your risk for osteoporosis.  Menopause may have certain physical symptoms and risks.  Hormone replacement therapy may reduce some of these symptoms and risks. Talk to your health care provider about whether hormone replacement therapy is right for you. Follow these instructions at home:  Schedule regular health, dental, and eye exams.  Stay current with your immunizations.  Do not use any tobacco products including cigarettes, chewing tobacco, or electronic cigarettes.  If you are pregnant, do not drink alcohol.  If you are breastfeeding, limit how much and how often you drink alcohol.  Limit alcohol intake to no more than 1 drink per day for nonpregnant women. One drink equals 12 ounces of beer, 5 ounces of wine, or 1  ounces of hard liquor.  Do  not use street drugs.  Do not share needles.  Ask your health care provider for help if you need support or information about quitting drugs.  Tell your health care provider if you often feel depressed.  Tell your health care provider if you have ever been abused or do not feel safe at home. This information is not intended to replace advice given to you by your health care provider. Make sure you discuss any questions you have with your health care provider. Document Released: 12/21/2010 Document Revised: 11/13/2015 Document Reviewed: 03/11/2015 Elsevier Interactive Patient Education  2019 [employer].

## 2018-11-03 ENCOUNTER — Other Ambulatory Visit: Payer: Self-pay | Admitting: Internal Medicine

## 2018-11-03 DIAGNOSIS — F5101 Primary insomnia: Secondary | ICD-10-CM

## 2019-03-14 DIAGNOSIS — Z23 Encounter for immunization: Secondary | ICD-10-CM | POA: Diagnosis not present

## 2019-04-20 ENCOUNTER — Telehealth: Payer: Self-pay

## 2019-04-20 ENCOUNTER — Other Ambulatory Visit: Payer: Self-pay | Admitting: Internal Medicine

## 2019-04-20 DIAGNOSIS — F5101 Primary insomnia: Secondary | ICD-10-CM

## 2019-04-20 MED ORDER — ZOLPIDEM TARTRATE 10 MG PO TABS: ORAL_TABLET | ORAL | 0 refills | Status: DC

## 2019-04-20 NOTE — Telephone Encounter (Signed)
rf rq for zolpidem. Pt contacted and informed she would need an appointment. Pt has scheduled for 04/26/2019 at 1pm.  Can a few be sent in to get her to the appointment. Pt states that she is out.

## 2019-04-26 ENCOUNTER — Ambulatory Visit (INDEPENDENT_AMBULATORY_CARE_PROVIDER_SITE_OTHER): Payer: Medicare Other | Admitting: Internal Medicine

## 2019-04-26 ENCOUNTER — Other Ambulatory Visit: Payer: Self-pay

## 2019-04-26 ENCOUNTER — Encounter: Payer: Self-pay | Admitting: Internal Medicine

## 2019-04-26 VITALS — BP 148/80 | HR 67 | Temp 97.7°F | Ht 64.0 in | Wt 106.0 lb

## 2019-04-26 DIAGNOSIS — F329 Major depressive disorder, single episode, unspecified: Secondary | ICD-10-CM | POA: Diagnosis not present

## 2019-04-26 DIAGNOSIS — N183 Chronic kidney disease, stage 3 unspecified: Secondary | ICD-10-CM | POA: Diagnosis not present

## 2019-04-26 DIAGNOSIS — M5440 Lumbago with sciatica, unspecified side: Secondary | ICD-10-CM

## 2019-04-26 DIAGNOSIS — M545 Low back pain, unspecified: Secondary | ICD-10-CM

## 2019-04-26 DIAGNOSIS — E785 Hyperlipidemia, unspecified: Secondary | ICD-10-CM | POA: Diagnosis not present

## 2019-04-26 DIAGNOSIS — N1831 Chronic kidney disease, stage 3a: Secondary | ICD-10-CM

## 2019-04-26 DIAGNOSIS — F450 Somatization disorder: Secondary | ICD-10-CM | POA: Diagnosis not present

## 2019-04-26 DIAGNOSIS — F32A Depression, unspecified: Secondary | ICD-10-CM

## 2019-04-26 DIAGNOSIS — G8929 Other chronic pain: Secondary | ICD-10-CM

## 2019-04-26 DIAGNOSIS — Z0000 Encounter for general adult medical examination without abnormal findings: Secondary | ICD-10-CM

## 2019-04-26 DIAGNOSIS — S22080G Wedge compression fracture of T11-T12 vertebra, subsequent encounter for fracture with delayed healing: Secondary | ICD-10-CM

## 2019-04-26 DIAGNOSIS — F5101 Primary insomnia: Secondary | ICD-10-CM | POA: Diagnosis not present

## 2019-04-26 MED ORDER — ZOLPIDEM TARTRATE 10 MG PO TABS: ORAL_TABLET | ORAL | 1 refills | Status: DC

## 2019-04-26 MED ORDER — FLUOXETINE HCL 20 MG PO CAPS: ORAL_CAPSULE | ORAL | 1 refills | Status: DC

## 2019-04-26 MED ORDER — TRAMADOL HCL 50 MG PO TABS
50.0000 mg | ORAL_TABLET | Freq: Four times a day (QID) | ORAL | 5 refills | Status: DC | PRN
Start: 2019-04-26 — End: 2019-12-06

## 2019-04-26 NOTE — Progress Notes (Signed)
Subjective:  Patient ID: Evelyn [name], female    DOB: [birth year]  Age: 85 y.o. MRN: [id number]  CC: Annual Exam, Back Pain, and Depression   HPI Evelyn EdenJoan S Name presents for a CPX.  She has her usual complaints of insomnia, depression, and chronic low back pain.  She requests refills.  Outpatient Medications Prior to Visit  Medication Sig Dispense Refill  . FLUoxetine (PROZAC) 20 MG capsule TAKE 1 CAPSULE BY MOUTH DAILY 90 capsule 1  . traMADol (ULTRAM) 50 MG tablet Take 1 tablet (50 mg total) by mouth every 6 (six) hours as needed for severe pain. 90 tablet 5  . zolpidem (AMBIEN) 10 MG tablet TAKE 1/2 TABLET BY MOUTH EVERY NIGHT AT BEDTIME AS NEEDED FOR SLEEP 90 tablet 0   No facility-administered medications prior to visit.     ROS Review of Systems  Constitutional: Negative for appetite change, diaphoresis, fatigue and fever.  HENT: Negative.   Eyes: Negative for visual disturbance.  Respiratory: Negative for cough, shortness of breath and wheezing.   Cardiovascular: Negative for chest pain, palpitations and leg swelling.  Gastrointestinal: Negative for abdominal pain, constipation, diarrhea, nausea and vomiting.  Endocrine: Negative.   Genitourinary: Negative.  Negative for difficulty urinating and hematuria.  Musculoskeletal: Positive for back pain. Negative for myalgias and neck pain.  Skin: Negative.  Negative for color change and pallor.  Neurological: Negative.  Negative for dizziness, weakness, light-headedness, numbness and headaches.  Hematological: Negative for adenopathy. Does not bruise/bleed easily.  Psychiatric/Behavioral: Positive for dysphoric mood and sleep disturbance. Negative for behavioral problems, confusion, self-injury and suicidal ideas. The patient is not nervous/anxious.     Objective:  BP (!) 148/80 (BP Location: Left Arm, Patient Position: Sitting, Cuff Size: Normal)   Pulse 67   Temp 97.7 F (36.5 C) (Oral)   Ht 5\' 4"  (1.626 m)   Wt 106  lb (48.1 kg)   SpO2 99%   BMI 18.19 kg/m   BP Readings from Last 3 Encounters:  04/26/19 (!) 148/80  08/28/18 132/70  01/09/18 (!) 144/75    Wt Readings from Last 3 Encounters:  04/26/19 106 lb (48.1 kg)  08/28/18 105 lb (47.6 kg)  01/09/18 110 lb (49.9 kg)    Physical Exam Vitals signs reviewed.  Constitutional:      Appearance: Normal appearance.  HENT:     Nose: Nose normal.     Mouth/Throat:     Mouth: Mucous membranes are moist.  Eyes:     General: No scleral icterus.    Conjunctiva/sclera: Conjunctivae normal.  Neck:     Musculoskeletal: Neck supple.  Cardiovascular:     Rate and Rhythm: Normal rate and regular rhythm.     Heart sounds: No murmur.  Pulmonary:     Effort: Pulmonary effort is normal.     Breath sounds: No stridor. No wheezing, rhonchi or rales.  Abdominal:     General: Abdomen is flat. Bowel sounds are normal. There is no distension.     Palpations: Abdomen is soft. There is no hepatomegaly or splenomegaly.     Tenderness: There is no abdominal tenderness.  Musculoskeletal: Normal range of motion.     Right lower leg: No edema.     Left lower leg: No edema.  Lymphadenopathy:     Cervical: No cervical adenopathy.  Skin:    General: Skin is warm and dry.     Coloration: Skin is not pale.  Neurological:     General: No focal deficit  present.     Mental Status: She is alert.  Psychiatric:        Attention and Perception: Attention and perception normal.        Mood and Affect: Mood is anxious and depressed. Affect is flat.        Speech: Speech normal.        Behavior: Behavior normal.        Thought Content: Thought content normal.     Lab Results  Component Value Date   WBC 9.1 01/09/2018   HGB 12.8 01/09/2018   HCT 41.6 01/09/2018   PLT 287 01/09/2018   GLUCOSE 85 01/09/2018   CHOL 193 12/11/2015   TRIG 95.0 12/11/2015   HDL 59.30 12/11/2015   LDLDIRECT 146.5 05/03/2013   LDLCALC 115 (H) 12/11/2015   ALT 11 01/09/2018   AST  27 01/09/2018   NA 140 01/09/2018   K 4.2 01/09/2018   CL 108 01/09/2018   CREATININE 1.04 (H) 01/09/2018   BUN 24 (H) 01/09/2018   CO2 21 (L) 01/09/2018   TSH 1.40 02/01/2017   INR 1.03 01/09/2018   HGBA1C 5.7 12/05/2014    No results found.  Assessment & Plan:   Jahnay was seen today for annual exam, back pain and depression.  Diagnoses and all orders for this visit:  Stage 3a chronic kidney disease- I will monitor her renal function and screen for complications. -     CBC with Differential; Future -     Basic metabolic panel; Future -     Urinalysis, Routine w reflex microscopic; Future  Hyperlipidemia with target LDL less than 130- She is due for a fasting lipid panel. -     Lipid panel; Future -     TSH; Future -     Hepatic function panel; Future  Routine general medical examination at a health care facility- Exam completed, labs ordered, vaccines reviewed, there are no cancer screenings indicated, patient education was given.  Depression with somatization- She is doing well on the current dose of fluoxetine.  Will continue at this dose. -     FLUoxetine (PROZAC) 20 MG capsule; TAKE 1 CAPSULE BY MOUTH DAILY  Closed wedge compression fracture of T12 vertebra with delayed healing, subsequent encounter -     traMADol (ULTRAM) 50 MG tablet; Take 1 tablet (50 mg total) by mouth every 6 (six) hours as needed for severe pain.  Chronic midline low back pain without sciatica -     traMADol (ULTRAM) 50 MG tablet; Take 1 tablet (50 mg total) by mouth every 6 (six) hours as needed for severe pain.  Chronic renal disease, stage 3, moderately decreased glomerular filtration rate (GFR) between 30-59 mL/min/1.73 square meter -     traMADol (ULTRAM) 50 MG tablet; Take 1 tablet (50 mg total) by mouth every 6 (six) hours as needed for severe pain.  Chronic midline low back pain with sciatica, sciatica laterality unspecified -     traMADol (ULTRAM) 50 MG tablet; Take 1 tablet (50 mg  total) by mouth every 6 (six) hours as needed for severe pain.  Primary insomnia -     zolpidem (AMBIEN) 10 MG tablet; TAKE 1/2 TABLET BY MOUTH EVERY NIGHT AT BEDTIME AS NEEDED FOR SLEEP   I am having Ledon Snare maintain her FLUoxetine, traMADol, and zolpidem.  Meds ordered this encounter  Medications  . FLUoxetine (PROZAC) 20 MG capsule    Sig: TAKE 1 CAPSULE BY MOUTH DAILY    Dispense:  90 capsule    Refill:  1  . traMADol (ULTRAM) 50 MG tablet    Sig: Take 1 tablet (50 mg total) by mouth every 6 (six) hours as needed for severe pain.    Dispense:  90 tablet    Refill:  5  . zolpidem (AMBIEN) 10 MG tablet    Sig: TAKE 1/2 TABLET BY MOUTH EVERY NIGHT AT BEDTIME AS NEEDED FOR SLEEP    Dispense:  90 tablet    Refill:  1     Follow-up: No follow-ups on file.  Sanda Linger, MD

## 2019-05-01 DIAGNOSIS — Z961 Presence of intraocular lens: Secondary | ICD-10-CM | POA: Diagnosis not present

## 2019-05-01 DIAGNOSIS — H52203 Unspecified astigmatism, bilateral: Secondary | ICD-10-CM | POA: Diagnosis not present

## 2019-05-01 DIAGNOSIS — H353121 Nonexudative age-related macular degeneration, left eye, early dry stage: Secondary | ICD-10-CM | POA: Diagnosis not present

## 2019-08-28 ENCOUNTER — Ambulatory Visit: Payer: Self-pay

## 2019-11-18 ENCOUNTER — Other Ambulatory Visit: Payer: Self-pay | Admitting: Internal Medicine

## 2019-11-18 DIAGNOSIS — F450 Somatization disorder: Secondary | ICD-10-CM

## 2019-12-06 ENCOUNTER — Ambulatory Visit (INDEPENDENT_AMBULATORY_CARE_PROVIDER_SITE_OTHER): Payer: Medicare Other | Admitting: Internal Medicine

## 2019-12-06 ENCOUNTER — Encounter: Payer: Self-pay | Admitting: Internal Medicine

## 2019-12-06 ENCOUNTER — Other Ambulatory Visit: Payer: Self-pay

## 2019-12-06 VITALS — BP 132/80 | HR 66 | Temp 98.2°F | Ht 64.0 in | Wt 107.0 lb

## 2019-12-06 DIAGNOSIS — N1831 Chronic kidney disease, stage 3a: Secondary | ICD-10-CM | POA: Diagnosis not present

## 2019-12-06 DIAGNOSIS — K5904 Chronic idiopathic constipation: Secondary | ICD-10-CM

## 2019-12-06 DIAGNOSIS — N183 Chronic kidney disease, stage 3 unspecified: Secondary | ICD-10-CM

## 2019-12-06 DIAGNOSIS — K581 Irritable bowel syndrome with constipation: Secondary | ICD-10-CM

## 2019-12-06 DIAGNOSIS — G8929 Other chronic pain: Secondary | ICD-10-CM

## 2019-12-06 DIAGNOSIS — M545 Low back pain, unspecified: Secondary | ICD-10-CM

## 2019-12-06 DIAGNOSIS — M5440 Lumbago with sciatica, unspecified side: Secondary | ICD-10-CM

## 2019-12-06 DIAGNOSIS — F5101 Primary insomnia: Secondary | ICD-10-CM

## 2019-12-06 DIAGNOSIS — S22080G Wedge compression fracture of T11-T12 vertebra, subsequent encounter for fracture with delayed healing: Secondary | ICD-10-CM

## 2019-12-06 MED ORDER — TRAMADOL HCL 50 MG PO TABS: 50.0000 mg | ORAL_TABLET | Freq: Four times a day (QID) | ORAL | 5 refills | Status: DC | PRN

## 2019-12-06 MED ORDER — TRULANCE 3 MG PO TABS: 1.0000 | ORAL_TABLET | Freq: Every day | ORAL | 0 refills | Status: DC

## 2019-12-06 MED ORDER — ZOLPIDEM TARTRATE 10 MG PO TABS: 5.0000 mg | ORAL_TABLET | Freq: Every evening | ORAL | 1 refills | Status: DC | PRN

## 2019-12-06 NOTE — Patient Instructions (Signed)

## 2019-12-06 NOTE — Progress Notes (Signed)
Subjective:  Patient ID: Evelyn [name], female    DOB: 04-14-1925  Age: 86 y.o. MRN: [id number]  CC: Follow-up (Follow up and med refills)  This visit occurred during the SARS-CoV-2 public health emergency.  Safety protocols were in place, including screening questions prior to the visit, additional usage of staff PPE, and extensive cleaning of exam room while observing appropriate contact time as indicated for disinfecting solutions.    HPI MAYTHE DERAMO presents for f/up - She continues to complain of constipation.  She said she has about 1 bowel movement a week.  She has occasional bloating but she denies abdominal pain, nausea, vomiting, diarrhea, loss of appetite, or weight loss.  She is not getting much symptom relief with Metamucil and MiraLAX.  She tells me her mood is stable on the current dose of fluoxetine.  She is controlling her musculoskeletal pain with the occasional dose of tramadol.  She continues to complain of insomnia and wants a refill of Ambien.  Outpatient Medications Prior to Visit  Medication Sig Dispense Refill  . FLUoxetine (PROZAC) 20 MG capsule TAKE 1 CAPSULE BY MOUTH DAILY 90 capsule 1  . traMADol (ULTRAM) 50 MG tablet Take 1 tablet (50 mg total) by mouth every 6 (six) hours as needed for severe pain. 90 tablet 5  . zolpidem (AMBIEN) 10 MG tablet TAKE 1/2 TABLET BY MOUTH EVERY NIGHT AT BEDTIME AS NEEDED FOR SLEEP 90 tablet 1   No facility-administered medications prior to visit.    ROS Review of Systems  Constitutional: Negative.  Negative for appetite change, diaphoresis, fatigue and unexpected weight change.  HENT: Negative.   Eyes: Negative for visual disturbance.  Respiratory: Negative for cough, chest tightness, shortness of breath and wheezing.   Cardiovascular: Negative for chest pain, palpitations and leg swelling.  Gastrointestinal: Positive for constipation. Negative for abdominal distention, abdominal pain, blood in stool, diarrhea, nausea and  vomiting.  Endocrine: Negative.   Genitourinary: Negative.  Negative for difficulty urinating, dysuria and hematuria.  Musculoskeletal: Positive for arthralgias and back pain. Negative for gait problem and myalgias.  Skin: Negative.  Negative for color change, pallor and rash.  Neurological: Negative.  Negative for dizziness, weakness, light-headedness, numbness and headaches.  Hematological: Negative for adenopathy. Does not bruise/bleed easily.  Psychiatric/Behavioral: Positive for dysphoric mood and sleep disturbance. Negative for behavioral problems, confusion, decreased concentration and suicidal ideas. The patient is not nervous/anxious and is not hyperactive.     Objective:  BP 132/80 (BP Location: Left Arm, Patient Position: Sitting, Cuff Size: Normal)   Pulse 66   Temp 98.2 F (36.8 C) (Oral)   Ht 5\' 4"  (1.626 m)   Wt 107 lb (48.5 kg)   SpO2 99%   BMI 18.37 kg/m   BP Readings from Last 3 Encounters:  12/06/19 132/80  04/26/19 (!) 148/80  08/28/18 132/70    Wt Readings from Last 3 Encounters:  12/06/19 107 lb (48.5 kg)  04/26/19 106 lb (48.1 kg)  08/28/18 105 lb (47.6 kg)    Physical Exam Vitals reviewed.  Constitutional:      Appearance: Normal appearance.  HENT:     Nose: Nose normal.     Mouth/Throat:     Mouth: Mucous membranes are moist.  Eyes:     General: No scleral icterus.    Conjunctiva/sclera: Conjunctivae normal.  Cardiovascular:     Rate and Rhythm: Normal rate and regular rhythm.     Heart sounds: No murmur heard.   Pulmonary:  Effort: Pulmonary effort is normal.     Breath sounds: No stridor. No wheezing, rhonchi or rales.  Abdominal:     General: Abdomen is flat. Bowel sounds are normal. There is no distension.     Palpations: Abdomen is soft. There is no hepatomegaly, splenomegaly or mass.     Tenderness: There is no abdominal tenderness.  Musculoskeletal:        General: Normal range of motion.     Cervical back: Neck supple.      Right lower leg: No edema.     Left lower leg: No edema.  Lymphadenopathy:     Cervical: No cervical adenopathy.  Skin:    General: Skin is warm and dry.  Neurological:     General: No focal deficit present.     Mental Status: She is alert.  Psychiatric:        Mood and Affect: Mood normal.        Behavior: Behavior normal.     Lab Results  Component Value Date   WBC 6.1 12/06/2019   HGB 12.8 12/06/2019   HCT 38.3 12/06/2019   PLT 200.0 12/06/2019   GLUCOSE 85 12/06/2019   CHOL 193 12/11/2015   TRIG 95.0 12/11/2015   HDL 59.30 12/11/2015   LDLDIRECT 146.5 05/03/2013   LDLCALC 115 (H) 12/11/2015   ALT 15 12/06/2019   AST 25 12/06/2019   NA 137 12/06/2019   K 4.7 12/06/2019   CL 107 12/06/2019   CREATININE 0.93 12/06/2019   BUN 37 (H) 12/06/2019   CO2 25 12/06/2019   TSH 1.31 12/06/2019   INR 1.03 01/09/2018   HGBA1C 5.7 12/05/2014    No results found.  Assessment & Plan:   Berlynn was seen today for follow-up.  Diagnoses and all orders for this visit:  Irritable bowel syndrome with constipation- See below. -     Plecanatide (TRULANCE) 3 MG TABS; Take 1 tablet by mouth daily.  Chronic idiopathic constipation- Her labs are negative for secondary causes.  I recommended that she try plecanatide for this. -     Plecanatide (TRULANCE) 3 MG TABS; Take 1 tablet by mouth daily. -     CBC with Differential/Platelet; Future -     Basic metabolic panel; Future -     TSH; Future -     Hepatic function panel; Future -     Magnesium; Future -     Magnesium -     Hepatic function panel -     TSH -     Basic metabolic panel -     CBC with Differential/Platelet  Stage 3a chronic kidney disease -     CBC with Differential/Platelet; Future -     Basic metabolic panel; Future -     Basic metabolic panel -     CBC with Differential/Platelet  Primary insomnia -     zolpidem (AMBIEN) 10 MG tablet; Take 0.5 tablets (5 mg total) by mouth at bedtime as needed for sleep. TAKE  1/2 TABLET BY MOUTH EVERY NIGHT AT BEDTIME AS NEEDED FOR SLEEP  Closed wedge compression fracture of T12 vertebra with delayed healing, subsequent encounter -     traMADol (ULTRAM) 50 MG tablet; Take 1 tablet (50 mg total) by mouth every 6 (six) hours as needed for severe pain.  Chronic midline low back pain without sciatica -     traMADol (ULTRAM) 50 MG tablet; Take 1 tablet (50 mg total) by mouth every 6 (six) hours as needed  for severe pain.  Chronic renal disease, stage 3, moderately decreased glomerular filtration rate (GFR) between 30-59 mL/min/1.73 square meter- Her renal function is stable.  She agrees to avoid nephrotoxic agents. -     traMADol (ULTRAM) 50 MG tablet; Take 1 tablet (50 mg total) by mouth every 6 (six) hours as needed for severe pain.  Chronic midline low back pain with sciatica, sciatica laterality unspecified -     traMADol (ULTRAM) 50 MG tablet; Take 1 tablet (50 mg total) by mouth every 6 (six) hours as needed for severe pain.   I have changed Virgina Evener. [facility]'s zolpidem. I am also having her start on Trulance. Additionally, I am having her maintain her FLUoxetine and traMADol.  Meds ordered this encounter  Medications  . Plecanatide (TRULANCE) 3 MG TABS    Sig: Take 1 tablet by mouth daily.    Dispense:  18 tablet    Refill:  0  . zolpidem (AMBIEN) 10 MG tablet    Sig: Take 0.5 tablets (5 mg total) by mouth at bedtime as needed for sleep. TAKE 1/2 TABLET BY MOUTH EVERY NIGHT AT BEDTIME AS NEEDED FOR SLEEP    Dispense:  45 tablet    Refill:  1  . traMADol (ULTRAM) 50 MG tablet    Sig: Take 1 tablet (50 mg total) by mouth every 6 (six) hours as needed for severe pain.    Dispense:  90 tablet    Refill:  5   I spent 50 minutes in preparing to see the patient by review of recent labs, imaging and procedures, obtaining and reviewing separately obtained history, communicating with the patient and family or caregiver, ordering medications, tests or procedures, and  documenting clinical information in the EHR including the differential Dx, treatment, and any further evaluation and other management of 1. Irritable bowel syndrome with constipation 2. Chronic idiopathic constipation 3. Stage 3a chronic kidney disease 4. Primary insomnia 5. Closed wedge compression fracture of T12 vertebra with delayed healing, subsequent encounter 6. Chronic midline low back pain without sciatica 7. Chronic renal disease, stage 3, moderately decreased glomerular filtration rate (GFR) between 30-59 mL/min/1.73 square meter 8. Chronic midline low back pain with sciatica, sciatica laterality unspecified     Follow-up: Return in about 6 months (around 06/06/2020).  Sanda Linger, MD

## 2019-12-07 LAB — TSH: TSH: 1.31 u[IU]/mL (ref 0.35–4.50)

## 2019-12-07 LAB — CBC WITH DIFFERENTIAL/PLATELET
Basophils Absolute: 0 10*3/uL (ref 0.0–0.1)
Basophils Relative: 0.7 % (ref 0.0–3.0)
Eosinophils Absolute: 0.1 10*3/uL (ref 0.0–0.7)
Eosinophils Relative: 2.1 % (ref 0.0–5.0)
HCT: 38.3 % (ref 36.0–46.0)
Hemoglobin: 12.8 g/dL (ref 12.0–15.0)
Lymphocytes Relative: 19.5 % (ref 12.0–46.0)
Lymphs Abs: 1.2 10*3/uL (ref 0.7–4.0)
MCHC: 33.4 g/dL (ref 30.0–36.0)
MCV: 92.3 fl (ref 78.0–100.0)
Monocytes Absolute: 0.7 10*3/uL (ref 0.1–1.0)
Monocytes Relative: 12.2 % — ABNORMAL HIGH (ref 3.0–12.0)
Neutro Abs: 4 10*3/uL (ref 1.4–7.7)
Neutrophils Relative %: 65.5 % (ref 43.0–77.0)
Platelets: 200 10*3/uL (ref 150.0–400.0)
RBC: 4.15 Mil/uL (ref 3.87–5.11)
RDW: 13.7 % (ref 11.5–15.5)
WBC: 6.1 10*3/uL (ref 4.0–10.5)

## 2019-12-07 LAB — BASIC METABOLIC PANEL
BUN: 37 mg/dL — ABNORMAL HIGH (ref 6–23)
CO2: 25 mEq/L (ref 19–32)
Calcium: 9.8 mg/dL (ref 8.4–10.5)
Chloride: 107 mEq/L (ref 96–112)
Creatinine, Ser: 0.93 mg/dL (ref 0.40–1.20)
GFR: 56.25 mL/min — ABNORMAL LOW (ref >60.00)
Glucose, Bld: 85 mg/dL (ref 70–99)
Potassium: 4.7 mEq/L (ref 3.5–5.1)
Sodium: 137 mEq/L (ref 135–145)

## 2019-12-07 LAB — HEPATIC FUNCTION PANEL
ALT: 15 U/L (ref 0–35)
AST: 25 U/L (ref 0–37)
Albumin: 4 g/dL (ref 3.5–5.2)
Alkaline Phosphatase: 99 U/L (ref 39–117)
Bilirubin, Direct: 0.1 mg/dL (ref 0.0–0.3)
Total Bilirubin: 0.4 mg/dL (ref 0.2–1.2)
Total Protein: 7.1 g/dL (ref 6.0–8.3)

## 2019-12-07 LAB — MAGNESIUM: Magnesium: 2.3 mg/dL (ref 1.5–2.5)

## 2019-12-13 ENCOUNTER — Telehealth: Payer: Self-pay

## 2019-12-13 NOTE — Telephone Encounter (Signed)
Key: [id number]

## 2019-12-18 NOTE — Telephone Encounter (Signed)
Checked the database and the last time tramadol was filled was 04/26/2019.   LVM for pt to call back. I wanted to check to see if she had heard anything from her insurance.

## 2020-03-13 DIAGNOSIS — Z23 Encounter for immunization: Secondary | ICD-10-CM | POA: Diagnosis not present

## 2020-03-24 DIAGNOSIS — Z23 Encounter for immunization: Secondary | ICD-10-CM | POA: Diagnosis not present

## 2020-04-16 ENCOUNTER — Telehealth: Payer: Self-pay | Admitting: Internal Medicine

## 2020-04-16 NOTE — Telephone Encounter (Signed)
    Patient's daughter calling to request refill and prior auth for traMADol (ULTRAM) 50 MG tablet Without prior auth medication is $100 Pharmacy:[id number] - , [place] - [address]

## 2020-04-18 NOTE — Telephone Encounter (Addendum)
Per [facility] a PA is not required due to the medication being on the plan's list of covered drugs.   Determination sent to scan

## 2020-04-18 NOTE — Telephone Encounter (Signed)
Key: [id number]

## 2020-05-02 DIAGNOSIS — H2513 Age-related nuclear cataract, bilateral: Secondary | ICD-10-CM | POA: Diagnosis not present

## 2020-05-02 DIAGNOSIS — H52203 Unspecified astigmatism, bilateral: Secondary | ICD-10-CM | POA: Diagnosis not present

## 2020-05-02 DIAGNOSIS — H31003 Unspecified chorioretinal scars, bilateral: Secondary | ICD-10-CM | POA: Diagnosis not present

## 2020-05-02 DIAGNOSIS — H353121 Nonexudative age-related macular degeneration, left eye, early dry stage: Secondary | ICD-10-CM | POA: Diagnosis not present

## 2020-07-04 ENCOUNTER — Other Ambulatory Visit: Payer: Self-pay | Admitting: Internal Medicine

## 2020-07-04 DIAGNOSIS — F329 Major depressive disorder, single episode, unspecified: Secondary | ICD-10-CM

## 2020-07-04 DIAGNOSIS — F32A Depression, unspecified: Secondary | ICD-10-CM

## 2020-07-08 ENCOUNTER — Other Ambulatory Visit: Payer: Self-pay | Admitting: Internal Medicine

## 2020-07-08 DIAGNOSIS — F5101 Primary insomnia: Secondary | ICD-10-CM

## 2020-08-18 ENCOUNTER — Telehealth: Payer: Self-pay | Admitting: Internal Medicine

## 2020-08-18 NOTE — Telephone Encounter (Signed)
LVM for pt to rtn my call to schedule AWV with [facility]. Please schedule appt if pt calls the office.  

## 2020-08-27 ENCOUNTER — Ambulatory Visit: Payer: Medicare Other | Admitting: Internal Medicine

## 2020-09-09 ENCOUNTER — Other Ambulatory Visit: Payer: Self-pay

## 2020-09-09 ENCOUNTER — Ambulatory Visit (INDEPENDENT_AMBULATORY_CARE_PROVIDER_SITE_OTHER): Payer: Medicare Other

## 2020-09-09 ENCOUNTER — Ambulatory Visit (INDEPENDENT_AMBULATORY_CARE_PROVIDER_SITE_OTHER): Payer: Medicare Other | Admitting: Internal Medicine

## 2020-09-09 ENCOUNTER — Encounter: Payer: Self-pay | Admitting: Internal Medicine

## 2020-09-09 VITALS — BP 122/70 | HR 68 | Temp 97.6°F | Ht 64.0 in | Wt 99.0 lb

## 2020-09-09 DIAGNOSIS — K5904 Chronic idiopathic constipation: Secondary | ICD-10-CM | POA: Diagnosis not present

## 2020-09-09 DIAGNOSIS — Z0000 Encounter for general adult medical examination without abnormal findings: Secondary | ICD-10-CM | POA: Diagnosis not present

## 2020-09-09 MED ORDER — LUBIPROSTONE 24 MCG PO CAPS: 24.0000 ug | ORAL_CAPSULE | Freq: Two times a day (BID) | ORAL | 1 refills | Status: DC

## 2020-09-09 NOTE — Patient Instructions (Signed)

## 2020-09-09 NOTE — Patient Instructions (Signed)
Evelyn [name] , Thank you for taking time to come for your Medicare Wellness Visit. I appreciate your ongoing commitment to your health goals. Please review the following plan we discussed and let me know if I can assist you in the future.   Screening recommendations/referrals: Colonoscopy: last done 03/24/2011; no repeat due to age Mammogram: last done 03/31/2012 Bone Density: last done 05/03/2013 Recommended yearly ophthalmology/optometry visit for glaucoma screening and checkup Recommended yearly dental visit for hygiene and checkup  Vaccinations: Influenza vaccine: 03/24/2020 Pneumococcal vaccine: 05/03/2013, 07/06/2016 Tdap vaccine: 08/13/2010 Shingles vaccine: 05/10/2018, 07/17/2018   Covid-19: 07/11/2019, 08/01/2019, 03/13/2020  Advanced directives: Please bring a copy of your health care power of attorney and living will to the office at your convenience.  Conditions/risks identified: Yes. Reviewed health maintenance screenings with patient today and relevant education, vaccines, and/or referrals were provided. Continue doing brain stimulating activities (puzzles, reading, adult coloring books, staying active) to keep memory sharp. Continue to eat heart healthy diet (full of fruits, vegetables, whole grains, lean protein, water--limit salt, fat, and sugar intake) and increase physical activity as tolerated.  Next appointment:  Please schedule your next Medicare Wellness Visit with your Nurse Health Advisor in 1 year by calling [phone].  Preventive Care 19 Years and Older, Female Preventive care refers to lifestyle choices and visits with your health care provider that can promote health and wellness. What does preventive care include?  A yearly physical exam. This is also called an annual well check.  Dental exams once or twice a year.  Routine eye exams. Ask your health care provider how often you should have your eyes checked.  Personal lifestyle choices, including:  Daily  care of your teeth and gums.  Regular physical activity.  Eating a healthy diet.  Avoiding tobacco and drug use.  Limiting alcohol use.  Practicing safe sex.  Taking low-dose aspirin every day.  Taking vitamin and mineral supplements as recommended by your health care provider. What happens during an annual well check? The services and screenings done by your health care provider during your annual well check will depend on your age, overall health, lifestyle risk factors, and family history of disease. Counseling  Your health care provider may ask you questions about your:  Alcohol use.  Tobacco use.  Drug use.  Emotional well-being.  Home and relationship well-being.  Sexual activity.  Eating habits.  History of falls.  Memory and ability to understand (cognition).  Work and work Astronomer.  Reproductive health. Screening  You may have the following tests or measurements:  Height, weight, and BMI.  Blood pressure.  Lipid and cholesterol levels. These may be checked every 5 years, or more frequently if you are over 34 years old.  Skin check.  Lung cancer screening. You may have this screening every year starting at age 70 if you have a 30-pack-year history of smoking and currently smoke or have quit within the past 15 years.  Fecal occult blood test (FOBT) of the stool. You may have this test every year starting at age 71.  Flexible sigmoidoscopy or colonoscopy. You may have a sigmoidoscopy every 5 years or a colonoscopy every 10 years starting at age 18.  Hepatitis C blood test.  Hepatitis B blood test.  Sexually transmitted disease (STD) testing.  Diabetes screening. This is done by checking your blood sugar (glucose) after you have not eaten for a while (fasting). You may have this done every 1-3 years.  Bone density scan. This is done to  screen for osteoporosis. You may have this done starting at age 9.  Mammogram. This may be done every 1-2  years. Talk to your health care provider about how often you should have regular mammograms. Talk with your health care provider about your test results, treatment options, and if necessary, the need for more tests. Vaccines  Your health care provider may recommend certain vaccines, such as:  Influenza vaccine. This is recommended every year.  Tetanus, diphtheria, and acellular pertussis (Tdap, Td) vaccine. You may need a Td booster every 10 years.  Zoster vaccine. You may need this after age 67.  Pneumococcal 13-valent conjugate (PCV13) vaccine. One dose is recommended after age 43.  Pneumococcal polysaccharide (PPSV23) vaccine. One dose is recommended after age 28. Talk to your health care provider about which screenings and vaccines you need and how often you need them. This information is not intended to replace advice given to you by your health care provider. Make sure you discuss any questions you have with your health care provider. Document Released: 07/04/2015 Document Revised: 02/25/2016 Document Reviewed: 04/08/2015 Elsevier Interactive Patient Education  2017 [employer].  Fall Prevention in the Home Falls can cause injuries. They can happen to people of all ages. There are many things you can do to make your home safe and to help prevent falls. What can I do on the outside of my home?  Regularly fix the edges of walkways and driveways and fix any cracks.  Remove anything that might make you trip as you walk through a door, such as a raised step or threshold.  Trim any bushes or trees on the path to your home.  Use bright outdoor lighting.  Clear any walking paths of anything that might make someone trip, such as rocks or tools.  Regularly check to see if handrails are loose or broken. Make sure that both sides of any steps have handrails.  Any raised decks and porches should have guardrails on the edges.  Have any leaves, snow, or ice cleared regularly.  Use  sand or salt on walking paths during winter.  Clean up any spills in your garage right away. This includes oil or grease spills. What can I do in the bathroom?  Use night lights.  Install grab bars by the toilet and in the tub and shower. Do not use towel bars as grab bars.  Use non-skid mats or decals in the tub or shower.  If you need to sit down in the shower, use a plastic, non-slip stool.  Keep the floor dry. Clean up any water that spills on the floor as soon as it happens.  Remove soap buildup in the tub or shower regularly.  Attach bath mats securely with double-sided non-slip rug tape.  Do not have throw rugs and other things on the floor that can make you trip. What can I do in the bedroom?  Use night lights.  Make sure that you have a light by your bed that is easy to reach.  Do not use any sheets or blankets that are too big for your bed. They should not hang down onto the floor.  Have a firm chair that has side arms. You can use this for support while you get dressed.  Do not have throw rugs and other things on the floor that can make you trip. What can I do in the kitchen?  Clean up any spills right away.  Avoid walking on wet floors.  Keep items that  you use a lot in easy-to-reach places.  If you need to reach something above you, use a strong step stool that has a grab bar.  Keep electrical cords out of the way.  Do not use floor polish or wax that makes floors slippery. If you must use wax, use non-skid floor wax.  Do not have throw rugs and other things on the floor that can make you trip. What can I do with my stairs?  Do not leave any items on the stairs.  Make sure that there are handrails on both sides of the stairs and use them. Fix handrails that are broken or loose. Make sure that handrails are as long as the stairways.  Check any carpeting to make sure that it is firmly attached to the stairs. Fix any carpet that is loose or worn.  Avoid  having throw rugs at the top or bottom of the stairs. If you do have throw rugs, attach them to the floor with carpet tape.  Make sure that you have a light switch at the top of the stairs and the bottom of the stairs. If you do not have them, ask someone to add them for you. What else can I do to help prevent falls?  Wear shoes that:  Do not have high heels.  Have rubber bottoms.  Are comfortable and fit you well.  Are closed at the toe. Do not wear sandals.  If you use a stepladder:  Make sure that it is fully opened. Do not climb a closed stepladder.  Make sure that both sides of the stepladder are locked into place.  Ask someone to hold it for you, if possible.  Clearly mark and make sure that you can see:  Any grab bars or handrails.  First and last steps.  Where the edge of each step is.  Use tools that help you move around (mobility aids) if they are needed. These include:  Canes.  Walkers.  Scooters.  Crutches.  Turn on the lights when you go into a dark area. Replace any light bulbs as soon as they burn out.  Set up your furniture so you have a clear path. Avoid moving your furniture around.  If any of your floors are uneven, fix them.  If there are any pets around you, be aware of where they are.  Review your medicines with your doctor. Some medicines can make you feel dizzy. This can increase your chance of falling. Ask your doctor what other things that you can do to help prevent falls. This information is not intended to replace advice given to you by your health care provider. Make sure you discuss any questions you have with your health care provider. Document Released: 04/03/2009 Document Revised: 11/13/2015 Document Reviewed: 07/12/2014 Elsevier Interactive Patient Education  2017 [employer].

## 2020-09-09 NOTE — Progress Notes (Signed)
Subjective:   Evelyn [name] is a 87 y.o. female who presents for Medicare Annual (Subsequent) preventive examination.  Review of Systems    No ROS. Medicare Wellness Visit. Additional risk factors are reflected in social history. Cardiac Risk Factors include: advanced age (>42men, >65 women);dyslipidemia     Objective:    Today's Vitals   09/09/20 1320  BP: 122/70  Pulse: 68  Temp: 97.6 F (36.4 C)  SpO2: 98%  Weight: 99 lb (44.9 kg)  Height: 5\' 4"  (1.626 m)   Body mass index is 16.99 kg/m.  Advanced Directives 09/09/2020 08/28/2018 12/28/2017 07/16/2017 03/09/2016 03/08/2016 01/30/2015  Does Patient Have a Medical Advance Directive? Yes Yes Yes No Yes Yes Yes  Type of 04/01/2015 of [place];Living will Healthcare Power of [place];Living will Healthcare Power of [place];Living will - Healthcare Power of [place];Living will Healthcare Power of [place];Living will -  Does patient want to make changes to medical advance directive? No - Patient declined - - - No - Patient declined - No - Patient declined  Copy of Healthcare Power of Attorney in Chart? No - copy requested No - copy requested - - No - copy requested - No - copy requested  Would patient like information on creating a medical advance directive? - - - - - - -    Current Medications (verified) Outpatient Encounter Medications as of 09/09/2020  Medication Sig  . FLUoxetine (PROZAC) 20 MG capsule TAKE 1 CAPSULE BY MOUTH DAILY  . traMADol (ULTRAM) 50 MG tablet Take 1 tablet (50 mg total) by mouth every 6 (six) hours as needed for severe pain.  09/11/2020 zolpidem (AMBIEN) 10 MG tablet TAKE 1/2 TABLET(5 MG) BY MOUTH EVERY NIGHT AT BEDTIME AS NEEDED FOR SLEEP  . Plecanatide (TRULANCE) 3 MG TABS Take 1 tablet by mouth daily.   No facility-administered encounter medications on file as of 09/09/2020.    Allergies (verified) Patient has no known allergies.   History: Past Medical History:  Diagnosis Date   . Allergy   . Arthritis   . Chronic cystitis   . Congenital blindness    right eye  . Depression   . H/O: hematuria   . Hepatitis, unspecified    statin related, resolved quickly  . Hyperlipidemia   . Legally blind in right eye, as defined in 09/11/2020   . Syncope and collapse 04/01/2014  . UTI (lower urinary tract infection)    HISTORY OF UTI   Past Surgical History:  Procedure Laterality Date  . ABDOMINAL HYSTERECTOMY     vaginal  . BLADDER SUSPENSION     A-P  . CATARACT EXTRACTION W/ INTRAOCULAR LENS IMPLANT Left   . OPEN REDUCTION INTERNAL FIXATION (ORIF) DISTAL RADIAL FRACTURE Right 03/09/2016   Procedure: OPEN REDUCTION INTERNAL FIXATION (ORIF) DISTAL RADIAL FRACTURE;  Surgeon: 03/11/2016, MD;  Location: [facility];  Service: Orthopedics;  Laterality: Right;   Family History  Problem Relation Age of Onset  . Cancer Neg Hx        Colon cancer  . Stroke Neg Hx   . Heart disease Neg Hx   . Hyperlipidemia Neg Hx   . Hypertension Neg Hx   . Kidney disease Neg Hx   . Colon polyps Neg Hx   . Colon cancer Neg Hx    Social History   Socioeconomic History  . Marital status: Widowed    Spouse name: Not on file  . Number of children: Not on file  .  Years of education: Not on file  . Highest education level: Not on file  Occupational History  . Occupation: Retired  Tobacco Use  . Smoking status: Never Smoker  . Smokeless tobacco: Never Used  Vaping Use  . Vaping Use: Never used  Substance and Sexual Activity  . Alcohol use: No  . Drug use: No  . Sexual activity: Not Currently  Other Topics Concern  . Not on file  Social History Narrative  . Not on file   Social Determinants of Health   Financial Resource Strain: Low Risk   . Difficulty of Paying Living Expenses: Not hard at all  Food Insecurity: No Food Insecurity  . Worried About Programme researcher, broadcasting/film/video in the Last Year: Never true  . Ran Out of Food in the Last Year: Never true  Transportation  Needs: No Transportation Needs  . Lack of Transportation (Medical): No  . Lack of Transportation (Non-Medical): No  Physical Activity: Sufficiently Active  . Days of Exercise per Week: 5 days  . Minutes of Exercise per Session: 30 min  Stress: No Stress Concern Present  . Feeling of Stress : Not at all  Social Connections: Moderately Integrated  . Frequency of Communication with Friends and Family: More than three times a week  . Frequency of Social Gatherings with Friends and Family: More than three times a week  . Attends Religious Services: 1 to 4 times per year  . Active Member of Clubs or Organizations: No  . Attends Banker Meetings: 1 to 4 times per year  . Marital Status: Widowed    Tobacco Counseling Counseling given: Not Answered   Clinical Intake:  Pre-visit preparation completed: Yes  Pain : No/denies pain     BMI - recorded: 16.99 Nutritional Status: BMI <19  Underweight Nutritional Risks: None Diabetes: No  How often do you need to have someone help you when you read instructions, pamphlets, or other written materials from your doctor or pharmacy?: 1 - Never What is the last grade level you completed in school?: Bachelor's Degree  Diabetic? no  Interpreter Needed?: No  Information entered by :: Susie Cassette, LPN   Activities of Daily Living In your present state of health, do you have any difficulty performing the following activities: 09/09/2020 09/09/2020  Hearing? N N  Vision? Y N  Comment right eye -  Difficulty concentrating or making decisions? Y N  Walking or climbing stairs? N N  Dressing or bathing? N N  Doing errands, shopping? N N  Preparing Food and eating ? N -  Using the Toilet? N -  In the past six months, have you accidently leaked urine? Y -  Comment wears protection -  Do you have problems with loss of bowel control? N -  Managing your Medications? N -  Managing your Finances? N -  Housekeeping or managing your  Housekeeping? N -  Some recent data might be hidden    Patient Care Team: Etta Grandchild, MD as PCP - General Rachael Fee, MD as Attending Physician (Gastroenterology)  Indicate any recent Medical Services you may have received from other than [facility] providers in the past year (date may be approximate).     Assessment:   This is a routine wellness examination for Evelyn [name].  Hearing/Vision screen No exam data present  Dietary issues and exercise activities discussed: Current Exercise Habits: The patient does not participate in regular exercise at present;Home exercise routine, Type of exercise: Other -  see comments (leg exercises), Time (Minutes): 30, Frequency (Times/Week): 5, Weekly Exercise (Minutes/Week): 150, Intensity: Moderate, Exercise limited by: psychological condition(s)  Goals    . Patient Stated     Maintain current health status and independence.      Depression Screen PHQ 2/9 Scores 09/09/2020 04/26/2019 08/28/2018 12/09/2014 05/03/2013 05/03/2013  PHQ - 2 Score 2 2 2 2 2  0  PHQ- 9 Score - 6 6 7 5  -    Fall Risk Fall Risk  09/09/2020 09/09/2020 08/28/2018 05/10/2018 05/28/2016  Falls in the past year? 0 0 0 0 Yes  Comment - - - Emmi Telephone Survey: data to providers prior to load [employer] Telephone Survey: data to providers prior to load  Number falls in past yr: 0 - - - 1  Comment - - - - Emmi Telephone Survey Actual Response = 1  Injury with Fall? 0 - - - Yes  Risk for fall due to : No Fall Risks - Impaired balance/gait - -  Follow up Falls evaluation completed - Falls prevention discussed - -    FALL RISK PREVENTION PERTAINING TO THE HOME:  Any stairs in or around the home? Yes  If so, are there any without handrails? No  Home free of loose throw rugs in walkways, pet beds, electrical cords, etc? Yes  Adequate lighting in your home to reduce risk of falls? Yes   ASSISTIVE DEVICES UTILIZED TO PREVENT FALLS:  Life alert? No  Use of a cane, walker or w/c? No   Grab bars in the bathroom? No  Shower chair or bench in shower? No  Elevated toilet seat or a handicapped toilet? No   TIMED UP AND GO:  Was the test performed? No .  Length of time to ambulate 10 feet: 0 sec.   Gait steady and fast without use of assistive device  Cognitive Function: MMSE - Mini Mental State Exam 08/28/2018  Orientation to time 5  Orientation to Place 5  Registration 3  Attention/ Calculation 5  Recall 1  Language- name 2 objects 2  Language- repeat 1  Language- follow 3 step command 3  Language- read & follow direction 1  Write a sentence 1  Copy design 1  Total score 28        Immunizations Immunization History  Administered Date(s) Administered  . Influenza Whole 03/22/2012  . Influenza, High Dose Seasonal PF 05/03/2013, 03/22/2016  . Influenza-Unspecified 04/22/2015, 04/12/2018, 03/15/2019  . PFIZER(Purple Top)SARS-COV-2 Vaccination 07/11/2019, 08/01/2019, 03/13/2020  . Pneumococcal Conjugate-13 05/03/2013  . Pneumococcal Polysaccharide-23 07/06/2016  . Td 08/13/2010  . Zoster Recombinat (Shingrix) 05/10/2018, 07/17/2018    TDAP status: Due, Education has been provided regarding the importance of this vaccine. Advised may receive this vaccine at local pharmacy or Health Dept. Aware to provide a copy of the vaccination record if obtained from local pharmacy or Health Dept. Verbalized acceptance and understanding.  Flu Vaccine status: Up to date  Pneumococcal vaccine status: Up to date  Covid-19 vaccine status: Completed vaccines  Qualifies for Shingles Vaccine? Yes   Zostavax completed No   Shingrix Completed?: Yes  Screening Tests Health Maintenance  Topic Date Due  . TETANUS/TDAP  08/13/2020  . INFLUENZA VACCINE  Completed  . DEXA SCAN  Completed  . COVID-19 Vaccine  Completed  . PNA vac Low Risk Adult  Completed  . HPV VACCINES  Aged Out    Health Maintenance  Health Maintenance Due  Topic Date Due  . Janet BerlinETANUS/TDAP   08/13/2020  Colorectal cancer screening: No longer required.   Mammogram status: No longer required due to age.  Bone Density status: Completed 05/03/2013. Results reflect: Bone density results: NORMAL. Repeat every 0 years.  Lung Cancer Screening: (Low Dose CT Chest recommended if Age 61-80 years, 30 pack-year currently smoking OR have quit w/in 15years.) does not qualify.   Lung Cancer Screening Referral: no  Additional Screening:  Hepatitis C Screening: does not qualify; Completed no  Vision Screening: Recommended annual ophthalmology exams for early detection of glaucoma and other disorders of the eye. Is the patient up to date with their annual eye exam?  Yes  Who is the provider or what is the name of the office in which the patient attends annual eye exams? Maris Berger, MD. If pt is not established with a provider, would they like to be referred to a provider to establish care? No .   Dental Screening: Recommended annual dental exams for proper oral hygiene  Community Resource Referral / Chronic Care Management: CRR required this visit?  No   CCM required this visit?  No      Plan:     I have personally reviewed and noted the following in the patient's chart:   . Medical and social history . Use of alcohol, tobacco or illicit drugs  . Current medications and supplements . Functional ability and status . Nutritional status . Physical activity . Advanced directives . List of other physicians . Hospitalizations, surgeries, and ER visits in previous 12 months . Vitals . Screenings to include cognitive, depression, and falls . Referrals and appointments  In addition, I have reviewed and discussed with patient certain preventive protocols, quality metrics, and best practice recommendations. A written personalized care plan for preventive services as well as general preventive health recommendations were provided to patient.     Mickeal Needy,  LPN   09/03/1759   Nurse Notes:  Medications reviewed with patient; no opioid use noted. Patient is cogitatively intact. Normal cognitive status assessed by direct observation by this Nurse Health Advisor. No abnormalities found.

## 2020-09-09 NOTE — Progress Notes (Signed)
Subjective:  Patient ID: Evelyn [name], female    DOB: 22-Nov-1924  Age: 87 y.o. MRN: [id number]  CC: Follow-up  This visit occurred during the SARS-CoV-2 public health emergency.  Safety protocols were in place, including screening questions prior to the visit, additional usage of staff PPE, and extensive cleaning of exam room while observing appropriate contact time as indicated for disinfecting solutions.    HPI Evelyn [name] presents for f/up -  She continues to struggle with constipation.  When I last saw her I gave her samples of Trulance but she did not take it for long because she did not think it was effective.  She has been getting some symptom relief recently with dates and prunes but continues to complain of constipation.  She has lost a few more pounds but feels like her appetite is good and she denies abdominal pain.  Outpatient Medications Prior to Visit  Medication Sig Dispense Refill  . FLUoxetine (PROZAC) 20 MG capsule TAKE 1 CAPSULE BY MOUTH DAILY 90 capsule 1  . traMADol (ULTRAM) 50 MG tablet Take 1 tablet (50 mg total) by mouth every 6 (six) hours as needed for severe pain. 90 tablet 5  . zolpidem (AMBIEN) 10 MG tablet TAKE 1/2 TABLET(5 MG) BY MOUTH EVERY NIGHT AT BEDTIME AS NEEDED FOR SLEEP 45 tablet 1  . Plecanatide (TRULANCE) 3 MG TABS Take 1 tablet by mouth daily. 18 tablet 0   No facility-administered medications prior to visit.    ROS Review of Systems  Constitutional: Positive for fatigue and unexpected weight change. Negative for diaphoresis and fever.  HENT: Negative.   Eyes: Negative.   Respiratory: Negative.  Negative for choking, shortness of breath and wheezing.   Cardiovascular: Negative for chest pain, palpitations and leg swelling.  Gastrointestinal: Positive for constipation. Negative for abdominal pain, nausea and vomiting.  Endocrine: Negative.  Negative for cold intolerance and heat intolerance.  Genitourinary: Negative.   Musculoskeletal:  Negative.  Negative for arthralgias.  Skin: Negative.   Neurological: Negative.  Negative for dizziness, weakness and light-headedness.  Hematological: Negative for adenopathy. Does not bruise/bleed easily.  Psychiatric/Behavioral: Negative.     Objective:  BP 122/70   Pulse 68   Temp 97.6 F (36.4 C) (Oral)   Ht 5\' 4"  (1.626 m)   Wt 99 lb (44.9 kg)   SpO2 98%   BMI 16.99 kg/m   BP Readings from Last 3 Encounters:  09/09/20 122/70  09/09/20 122/70  12/06/19 132/80    Wt Readings from Last 3 Encounters:  09/09/20 99 lb (44.9 kg)  09/09/20 99 lb (44.9 kg)  12/06/19 107 lb (48.5 kg)    Physical Exam Vitals reviewed.  HENT:     Nose: Nose normal.  Eyes:     General: No scleral icterus.    Conjunctiva/sclera: Conjunctivae normal.  Cardiovascular:     Rate and Rhythm: Normal rate and regular rhythm.     Heart sounds: No murmur heard.   Pulmonary:     Effort: Pulmonary effort is normal.     Breath sounds: No stridor. No wheezing, rhonchi or rales.  Abdominal:     General: Abdomen is flat.     Palpations: There is no mass.     Tenderness: There is no abdominal tenderness. There is no guarding.  Musculoskeletal:        General: Normal range of motion.     Cervical back: Neck supple.     Right lower leg: No edema.  Left lower leg: No edema.  Lymphadenopathy:     Cervical: No cervical adenopathy.  Skin:    General: Skin is warm and dry.  Neurological:     General: No focal deficit present.     Mental Status: She is alert.  Psychiatric:        Mood and Affect: Mood normal.        Behavior: Behavior normal.        Thought Content: Thought content normal.        Judgment: Judgment normal.     Lab Results  Component Value Date   WBC 6.1 12/06/2019   HGB 12.8 12/06/2019   HCT 38.3 12/06/2019   PLT 200.0 12/06/2019   GLUCOSE 85 12/06/2019   CHOL 193 12/11/2015   TRIG 95.0 12/11/2015   HDL 59.30 12/11/2015   LDLDIRECT 146.5 05/03/2013   LDLCALC 115 (H)  12/11/2015   ALT 15 12/06/2019   AST 25 12/06/2019   NA 137 12/06/2019   K 4.7 12/06/2019   CL 107 12/06/2019   CREATININE 0.93 12/06/2019   BUN 37 (H) 12/06/2019   CO2 25 12/06/2019   TSH 1.31 12/06/2019   INR 1.03 01/09/2018   HGBA1C 5.7 12/05/2014    No results found.  Assessment & Plan:   Evelyn [name] was seen today for follow-up.  Diagnoses and all orders for this visit:  Chronic idiopathic constipation- Will try amitiza. -     lubiprostone (AMITIZA) 24 MCG capsule; Take 1 capsule (24 mcg total) by mouth 2 (two) times daily with a meal.   I have discontinued Harriette Bouillon Trulance. I am also having her start on lubiprostone. Additionally, I am having her maintain her traMADol, FLUoxetine, and zolpidem.  Meds ordered this encounter  Medications  . lubiprostone (AMITIZA) 24 MCG capsule    Sig: Take 1 capsule (24 mcg total) by mouth 2 (two) times daily with a meal.    Dispense:  180 capsule    Refill:  1     Follow-up: Return in about 6 months (around 03/12/2021).  Sanda Linger, MD

## 2020-09-18 ENCOUNTER — Telehealth: Payer: Self-pay | Admitting: Internal Medicine

## 2020-09-18 NOTE — Telephone Encounter (Signed)
  lubiprostone (AMITIZA) 24 MCG capsule Patients daughter calling, states the patient needs a PA for this medication because insurance does not cover it

## 2020-09-19 ENCOUNTER — Telehealth: Payer: Self-pay

## 2020-09-19 NOTE — Telephone Encounter (Signed)
Key: [id number] 

## 2020-09-19 NOTE — Telephone Encounter (Signed)
Key: [id number]

## 2020-09-22 NOTE — Telephone Encounter (Signed)
PA denied.   See 09/19/2020 phone note.

## 2020-09-22 NOTE — Telephone Encounter (Signed)
Patients daughter called and was wondering if there was an alternative medication that could be called into [id number] - [place], [place] - [address]. Please advise    Patients daughter is requesting a call back at [phone]

## 2020-09-22 NOTE — Telephone Encounter (Signed)
PA was denied

## 2020-09-23 ENCOUNTER — Other Ambulatory Visit: Payer: Self-pay | Admitting: Internal Medicine

## 2020-09-23 ENCOUNTER — Telehealth: Payer: Self-pay | Admitting: Internal Medicine

## 2020-09-23 DIAGNOSIS — K5904 Chronic idiopathic constipation: Secondary | ICD-10-CM

## 2020-09-23 MED ORDER — TRULANCE 3 MG PO TABS: 1.0000 | ORAL_TABLET | Freq: Every day | ORAL | 1 refills | Status: DC

## 2020-09-23 NOTE — Telephone Encounter (Signed)
RX sent

## 2020-09-23 NOTE — Telephone Encounter (Signed)
Patients daughter called. She said that she had a question but did not specify. She can be reached at [phone].

## 2020-09-24 ENCOUNTER — Other Ambulatory Visit: Payer: Self-pay | Admitting: Internal Medicine

## 2020-09-24 DIAGNOSIS — K5904 Chronic idiopathic constipation: Secondary | ICD-10-CM

## 2020-09-24 MED ORDER — LUBIPROSTONE 24 MCG PO CAPS: 24.0000 ug | ORAL_CAPSULE | Freq: Two times a day (BID) | ORAL | 1 refills | Status: DC

## 2020-09-24 NOTE — Telephone Encounter (Signed)
I think trulance will work if she takes it consistently

## 2020-09-24 NOTE — Telephone Encounter (Signed)
Pt's daughter, Misty Stanley, stated that the Trulance did not work for the pt in the past and if they will have to pay for something out of pocket they would prefer to try the Amitiza. They would like that Rx sent back in.   I also suggested to Misty Stanley that they contact her insurance/drug coverage to see what similar medications that are covered and in the pt's price point. She voiced that should be our job to do but I had to explain to her that we do not have access to drug formularies to know what is or isn't covered. She expressed understanding and stated that she would give [place] a call once she has recommendations from insurance.

## 2020-09-24 NOTE — Telephone Encounter (Signed)
Patients daughter called and said that the Plecanatide (TRULANCE) 3 MG TABS did not work for her last time. She said that she would pick it up from the pharmacy but wanted to let Dr. Yetta Barre know. She can be reached at [phone].

## 2020-09-24 NOTE — Telephone Encounter (Signed)
Patients daughter called again. She can be reached at [phone].

## 2020-09-24 NOTE — Telephone Encounter (Signed)
    Daughter calling to report Plecanatide (TRULANCE) 3 MG TABS Is $800 out of  Pocket cost. Requesting alternative

## 2020-09-24 NOTE — Telephone Encounter (Signed)
Called pt, LVM.   

## 2020-10-08 ENCOUNTER — Other Ambulatory Visit: Payer: Self-pay

## 2020-10-08 DIAGNOSIS — K5904 Chronic idiopathic constipation: Secondary | ICD-10-CM

## 2020-10-08 MED ORDER — LUBIPROSTONE 24 MCG PO CAPS: 24.0000 ug | ORAL_CAPSULE | Freq: Two times a day (BID) | ORAL | 1 refills | Status: DC

## 2020-10-08 NOTE — Telephone Encounter (Signed)
Done

## 2020-10-08 NOTE — Telephone Encounter (Signed)
Patients daughter wondering if we can send in the lubiprostone (AMITIZA) 24 MCG capsule again for the patient, she is willing to pay the out of pocket cost

## 2020-11-04 ENCOUNTER — Telehealth: Payer: Self-pay | Admitting: Internal Medicine

## 2020-11-04 NOTE — Telephone Encounter (Signed)
Patients son called and said that the patient has been taking FLUoxetine (PROZAC) 20 MG capsule twice daily. He said that she was not seeing any changes with 20 mg and was wondering if a new prescription for 40 mg could be sent to [facility] #[id number] - [place], [place] - [address]. Please advise. Patients son is requesting a call back if there are any questions or issues. He can be reached at [phone]

## 2020-11-07 NOTE — Telephone Encounter (Signed)
I don't think a higher dose would be helpful

## 2020-11-07 NOTE — Telephone Encounter (Signed)
Follow up    Patient's son Lu Duffel calling to report patient has already  been taking 40mg  of Prozac for the past 3 weeks, and she feels the medication increase has beenhelping

## 2020-11-10 ENCOUNTER — Other Ambulatory Visit: Payer: Self-pay | Admitting: Internal Medicine

## 2020-11-10 DIAGNOSIS — F32A Depression, unspecified: Secondary | ICD-10-CM

## 2020-11-10 DIAGNOSIS — F329 Major depressive disorder, single episode, unspecified: Secondary | ICD-10-CM

## 2020-11-10 MED ORDER — FLUOXETINE HCL 40 MG PO CAPS: 40.0000 mg | ORAL_CAPSULE | Freq: Two times a day (BID) | ORAL | 1 refills | Status: DC

## 2020-12-09 ENCOUNTER — Encounter: Payer: Self-pay | Admitting: Family Medicine

## 2020-12-09 ENCOUNTER — Ambulatory Visit (INDEPENDENT_AMBULATORY_CARE_PROVIDER_SITE_OTHER): Payer: Medicare Other | Admitting: Family Medicine

## 2020-12-09 ENCOUNTER — Other Ambulatory Visit: Payer: Self-pay

## 2020-12-09 VITALS — BP 112/78 | HR 61 | Temp 97.9°F | Ht 61.5 in | Wt 100.0 lb

## 2020-12-09 DIAGNOSIS — F5101 Primary insomnia: Secondary | ICD-10-CM | POA: Diagnosis not present

## 2020-12-09 DIAGNOSIS — G8929 Other chronic pain: Secondary | ICD-10-CM

## 2020-12-09 DIAGNOSIS — E785 Hyperlipidemia, unspecified: Secondary | ICD-10-CM

## 2020-12-09 DIAGNOSIS — K5904 Chronic idiopathic constipation: Secondary | ICD-10-CM

## 2020-12-09 DIAGNOSIS — F329 Major depressive disorder, single episode, unspecified: Secondary | ICD-10-CM

## 2020-12-09 DIAGNOSIS — F450 Somatization disorder: Secondary | ICD-10-CM

## 2020-12-09 DIAGNOSIS — M5440 Lumbago with sciatica, unspecified side: Secondary | ICD-10-CM

## 2020-12-09 DIAGNOSIS — F32A Depression, unspecified: Secondary | ICD-10-CM

## 2020-12-09 MED ORDER — ZOLPIDEM TARTRATE 10 MG PO TABS: ORAL_TABLET | ORAL | 1 refills | Status: DC

## 2020-12-09 NOTE — Patient Instructions (Signed)
Senna-S start with 2 tabs/day and increase by 2 tabs every 2 days if not getting results up to 8/day For bones take Vit D 1000 units/day and calcium 1000 mg/day

## 2020-12-09 NOTE — Progress Notes (Addendum)
Provider:  Jacalyn Lefevre, MD  Careteam: Patient Care Team: Etta Grandchild, MD as PCP - General Rachael Fee, MD as Attending Physician (Gastroenterology) Maris Berger, MD as Consulting Physician (Ophthalmology)  PLACE OF SERVICE:  [facility]  Advanced Directive information    No Known Allergies  Chief Complaint  Patient presents with   Establish Care    New patient establish care. Here with daughter, Misty Stanley. Patient c/o constipation off/on. Patient has assistive devices (walker and cane) but does not use them. Moderate fall risk. Sign treatment agreement for Ambien and Tramadol.      HPI: Patient is a 87 y.o. female .  New patient here, transferred from [facility] practice.  87 year old female who lives alone.  She does not drive anymore.  She has a daughter who checks on her.  Basically she is in good health.  She takes tramadol for intermittent back pain Ambien for insomnia and Prozac for depression.  She is chronically constipated. She has no specific complaints today.  Review of Systems:  Review of Systems  Respiratory: Negative.    Cardiovascular: Negative.   Gastrointestinal:  Positive for constipation.  Musculoskeletal:  Positive for back pain.  Psychiatric/Behavioral:  Positive for depression.   All other systems reviewed and are negative.  Past Medical History:  Diagnosis Date   Allergy    Arthritis    Chronic cystitis    Congenital blindness    right eye   Depression    H/O: hematuria    Hepatitis, unspecified    statin related, resolved quickly   Hyperlipidemia    Legally blind in right eye, as defined in [place]    Syncope and collapse 04/01/2014   UTI (lower urinary tract infection)    HISTORY OF UTI   Past Surgical History:  Procedure Laterality Date   ABDOMINAL HYSTERECTOMY     vaginal   BLADDER SUSPENSION     A-P   CATARACT EXTRACTION W/ INTRAOCULAR LENS IMPLANT Left    OPEN REDUCTION INTERNAL FIXATION (ORIF) DISTAL RADIAL FRACTURE  Right 03/09/2016   Procedure: OPEN REDUCTION INTERNAL FIXATION (ORIF) DISTAL RADIAL FRACTURE;  Surgeon: Betha Loa, MD;  Location: [facility];  Service: Orthopedics;  Laterality: Right;   Social History:   reports that she has never smoked. She has never used smokeless tobacco. She reports that she does not drink alcohol and does not use drugs.  Family History  Problem Relation Age of Onset   Cancer Neg Hx        Colon cancer   Stroke Neg Hx    Heart disease Neg Hx    Hyperlipidemia Neg Hx    Hypertension Neg Hx    Kidney disease Neg Hx    Colon polyps Neg Hx    Colon cancer Neg Hx     Medications: Patient's Medications  New Prescriptions   No medications on file  Previous Medications   FLUOXETINE (PROZAC) 20 MG CAPSULE    Take 40 mg by mouth daily.   POLYETHYLENE GLYCOL (MIRALAX / GLYCOLAX) 17 G PACKET    Take 17 g by mouth as needed.   TRAMADOL (ULTRAM) 50 MG TABLET    Take 50 mg by mouth every 6 (six) hours as needed for moderate pain (For Back Pain).  Modified Medications   Modified Medication Previous Medication   ZOLPIDEM (AMBIEN) 10 MG TABLET zolpidem (AMBIEN) 10 MG tablet      TAKE 1/2 TABLET(5 MG) BY MOUTH EVERY NIGHT AT BEDTIME AS  NEEDED FOR SLEEP    TAKE 1/2 TABLET(5 MG) BY MOUTH EVERY NIGHT AT BEDTIME AS NEEDED FOR SLEEP  Discontinued Medications   No medications on file    Physical Exam:  Vitals:   12/09/20 1447  BP: 112/78  Pulse: 61  Temp: 97.9 F (36.6 C)  TempSrc: Temporal  SpO2: 99%  Weight: 100 lb (45.4 kg)  Height: 5' 1.5" (1.562 m)   Body mass index is 18.59 kg/m. Wt Readings from Last 3 Encounters:  12/09/20 100 lb (45.4 kg)  09/09/20 99 lb (44.9 kg)  09/09/20 99 lb (44.9 kg)    Physical Exam Vitals and nursing note reviewed.  Constitutional:      Appearance: Normal appearance.  HENT:     Head: Normocephalic.     Right Ear: Tympanic membrane normal.     Left Ear: Tympanic membrane normal.     Nose: Nose normal.   Eyes:     Extraocular Movements: Extraocular movements intact.     Pupils: Pupils are equal, round, and reactive to light.  Cardiovascular:     Rate and Rhythm: Normal rate.  Pulmonary:     Effort: Pulmonary effort is normal.     Breath sounds: Normal breath sounds.  Abdominal:     General: Abdomen is flat. Bowel sounds are normal.  Musculoskeletal:        General: Normal range of motion.     Cervical back: Normal range of motion.  Skin:    General: Skin is warm and dry.  Neurological:     General: No focal deficit present.     Mental Status: She is alert and oriented to person, place, and time.  Psychiatric:        Mood and Affect: Mood normal.        Behavior: Behavior normal.        Thought Content: Thought content normal.        Judgment: Judgment normal.     Comments: I did brief memory check: She remembers what she ate for supper last night She remembers 2 of 3 words at 5 minutes She did fair on clock drawing She cannot spell world backwards She refused to try counting backwards by sevens from 100.    Labs reviewed: Basic Metabolic Panel: No results for input(s): NA, K, CL, CO2, GLUCOSE, BUN, CREATININE, CALCIUM, MG, PHOS, TSH in the last 8760 hours. Liver Function Tests: No results for input(s): AST, ALT, ALKPHOS, BILITOT, PROT, ALBUMIN in the last 8760 hours. No results for input(s): LIPASE, AMYLASE in the last 8760 hours. No results for input(s): AMMONIA in the last 8760 hours. CBC: No results for input(s): WBC, NEUTROABS, HGB, HCT, MCV, PLT in the last 8760 hours. Lipid Panel: No results for input(s): CHOL, HDL, LDLCALC, TRIG, CHOLHDL, LDLDIRECT in the last 8760 hours. TSH: No results for input(s): TSH in the last 8760 hours. A1C: Lab Results  Component Value Date   HGBA1C 5.7 12/05/2014     Assessment/Plan  1. Primary insomnia Patient takes 5 mg Ambien every night and sleeps well.  Gets up and frequently at night to use bathroom - zolpidem (AMBIEN)  10 MG tablet; TAKE 1/2 TABLET(5 MG) BY MOUTH EVERY NIGHT AT BEDTIME AS NEEDED FOR SLEEP  Dispense: 45 tablet; Refill: 1  2. Hyperlipidemia with target LDL less than 130 Lipids were assessed within the last year.  Would not be inclined to treat lipids at her age  100. Depression with somatization Seems to be doing well on 20  mg of Prozac.  Has been taking 40 for a while but it did not seem to be more effective  4. Chronic idiopathic constipation We will try senna as beginning with 2 tablets and titrating upwards to a maximum of 8/day depending on response.  Also will try to get some samples of Amitiza  5. Chronic midline low back pain with sciatica, sciatica laterality unspecified Takes tramadol infrequently for midline back pain   Jacalyn Lefevre, MD [facility] [phone]

## 2020-12-31 ENCOUNTER — Other Ambulatory Visit: Payer: Self-pay | Admitting: Internal Medicine

## 2021-03-04 DIAGNOSIS — Z23 Encounter for immunization: Secondary | ICD-10-CM | POA: Diagnosis not present

## 2021-03-23 DIAGNOSIS — Z23 Encounter for immunization: Secondary | ICD-10-CM | POA: Diagnosis not present

## 2021-05-06 DIAGNOSIS — H2511 Age-related nuclear cataract, right eye: Secondary | ICD-10-CM | POA: Diagnosis not present

## 2021-05-06 DIAGNOSIS — H353121 Nonexudative age-related macular degeneration, left eye, early dry stage: Secondary | ICD-10-CM | POA: Diagnosis not present

## 2021-05-06 DIAGNOSIS — H52203 Unspecified astigmatism, bilateral: Secondary | ICD-10-CM | POA: Diagnosis not present

## 2021-05-06 DIAGNOSIS — H31001 Unspecified chorioretinal scars, right eye: Secondary | ICD-10-CM | POA: Diagnosis not present

## 2021-06-16 ENCOUNTER — Ambulatory Visit (INDEPENDENT_AMBULATORY_CARE_PROVIDER_SITE_OTHER): Payer: Medicare Other | Admitting: Family Medicine

## 2021-06-16 ENCOUNTER — Other Ambulatory Visit: Payer: Self-pay

## 2021-06-16 ENCOUNTER — Encounter: Payer: Self-pay | Admitting: Family Medicine

## 2021-06-16 VITALS — BP 116/76 | HR 60 | Temp 97.3°F | Ht 61.5 in | Wt 96.6 lb

## 2021-06-16 DIAGNOSIS — S22080G Wedge compression fracture of T11-T12 vertebra, subsequent encounter for fracture with delayed healing: Secondary | ICD-10-CM

## 2021-06-16 DIAGNOSIS — N1831 Chronic kidney disease, stage 3a: Secondary | ICD-10-CM

## 2021-06-16 DIAGNOSIS — F5101 Primary insomnia: Secondary | ICD-10-CM | POA: Diagnosis not present

## 2021-06-16 DIAGNOSIS — K5904 Chronic idiopathic constipation: Secondary | ICD-10-CM

## 2021-06-16 NOTE — Patient Instructions (Signed)
Keep moving and exercising your brain (reading, cross-word puzzles)

## 2021-06-16 NOTE — Progress Notes (Signed)
Provider:  Jacalyn Lefevre, MD  Careteam: Patient Care Team: Frederica Kuster, MD as PCP - General (Family Medicine) Rachael Fee, MD as Attending Physician (Gastroenterology) Maris Berger, MD as Consulting Physician (Ophthalmology)  PLACE OF SERVICE:  [facility]  Advanced Directive information    No Known Allergies  Chief Complaint  Patient presents with   Medical Management of Chronic Issues    Patient presents today for a 6 month follow-up.   Quality Metric Gaps    TDAP, Flu, COVID booster     HPI: Patient is a 87 y.o. female 17-month follow-up visit to assess chronic problems including depression back pain insomnia.  Patient continues to live alone with help of her daughter who lives close by but we spent most over the visit today talking about moving to probable assisted living.  Patient has had some falls and feels like she might need walker.  In talking with patient and daughter today I think assisted living would be appropriate.  I think she is moving around less with resulting weakness and risk of fall. Sleeping okay as long as she takes 2.5 mg of Ambien.  Continues to take tramadol occasionally for back pain but relies mostly on Tylenol.  Continues with MiraLAX for constipation as well as 20 mg Prozac for depression  Review of Systems:  Review of Systems  Constitutional:  Positive for malaise/fatigue and weight loss.  HENT: Negative.    Respiratory: Negative.    Cardiovascular: Negative.   Gastrointestinal:  Positive for constipation.  Genitourinary: Negative.   Neurological: Negative.   Psychiatric/Behavioral:  Positive for depression.    Past Medical History:  Diagnosis Date   Allergy    Arthritis    Chronic cystitis    Congenital blindness    right eye   Depression    H/O: hematuria    Hepatitis, unspecified    statin related, resolved quickly   Hyperlipidemia    Legally blind in right eye, as defined in [place]    Syncope and collapse  04/01/2014   UTI (lower urinary tract infection)    HISTORY OF UTI   Past Surgical History:  Procedure Laterality Date   ABDOMINAL HYSTERECTOMY     vaginal   BLADDER SUSPENSION     A-P   CATARACT EXTRACTION W/ INTRAOCULAR LENS IMPLANT Left    OPEN REDUCTION INTERNAL FIXATION (ORIF) DISTAL RADIAL FRACTURE Right 03/09/2016   Procedure: OPEN REDUCTION INTERNAL FIXATION (ORIF) DISTAL RADIAL FRACTURE;  Surgeon: Betha Loa, MD;  Location: [facility];  Service: Orthopedics;  Laterality: Right;   Social History:   reports that she has never smoked. She has never used smokeless tobacco. She reports that she does not drink alcohol and does not use drugs.  Family History  Problem Relation Age of Onset   Cancer Neg Hx        Colon cancer   Stroke Neg Hx    Heart disease Neg Hx    Hyperlipidemia Neg Hx    Hypertension Neg Hx    Kidney disease Neg Hx    Colon polyps Neg Hx    Colon cancer Neg Hx     Medications: Patient's Medications  New Prescriptions   No medications on file  Previous Medications   FLUOXETINE (PROZAC) 20 MG CAPSULE    Take 40 mg by mouth daily.   POLYETHYLENE GLYCOL (MIRALAX / GLYCOLAX) 17 G PACKET    Take 17 g by mouth as needed.   TRAMADOL (ULTRAM) 50  MG TABLET    Take 50 mg by mouth every 6 (six) hours as needed for moderate pain (For Back Pain).   ZOLPIDEM (AMBIEN) 10 MG TABLET    TAKE 1/2 TABLET(5 MG) BY MOUTH EVERY NIGHT AT BEDTIME AS NEEDED FOR SLEEP  Modified Medications   No medications on file  Discontinued Medications   No medications on file    Physical Exam:  Vitals:   06/16/21 1428  Weight: 96 lb 9.6 oz (43.8 kg)  Height: 5' 1.5" (1.562 m)   Body mass index is 17.96 kg/m. Wt Readings from Last 3 Encounters:  06/16/21 96 lb 9.6 oz (43.8 kg)  12/09/20 100 lb (45.4 kg)  09/09/20 99 lb (44.9 kg)    Physical Exam Vitals and nursing note reviewed.  Constitutional:      Appearance: Normal appearance.  Cardiovascular:     Rate  and Rhythm: Normal rate and regular rhythm.  Pulmonary:     Effort: Pulmonary effort is normal.     Breath sounds: Normal breath sounds.  Musculoskeletal:     Comments: Gait is a little unsteady.  I think patient would benefit from walker or at least a cane  Skin:    General: Skin is warm.  Neurological:     General: No focal deficit present.     Mental Status: She is alert and oriented to person, place, and time.    Labs reviewed: Basic Metabolic Panel: No results for input(s): NA, K, CL, CO2, GLUCOSE, BUN, CREATININE, CALCIUM, MG, PHOS, TSH in the last 8760 hours. Liver Function Tests: No results for input(s): AST, ALT, ALKPHOS, BILITOT, PROT, ALBUMIN in the last 8760 hours. No results for input(s): LIPASE, AMYLASE in the last 8760 hours. No results for input(s): AMMONIA in the last 8760 hours. CBC: No results for input(s): WBC, NEUTROABS, HGB, HCT, MCV, PLT in the last 8760 hours. Lipid Panel: No results for input(s): CHOL, HDL, LDLCALC, TRIG, CHOLHDL, LDLDIRECT in the last 8760 hours. TSH: No results for input(s): TSH in the last 8760 hours. A1C: Lab Results  Component Value Date   HGBA1C 5.7 12/05/2014     Assessment/Plan  1. Stage 3a chronic kidney disease (HCC) Will reassess renal function today.  Last checked 6 months ago and was stable at that time  2. Chronic idiopathic constipation Managed with MiraLAX.  Continue same  3. Closed wedge compression fracture of T12 vertebra with delayed healing, subsequent encounter Again the since her back pain has improved still takes occasional tramadol but per daughter that has really declined  4. Primary insomnia Depends on zolpidem for sleep no problems with next-day hangover by history   Jacalyn Lefevre, MD [facility] [phone]

## 2021-06-17 LAB — BASIC METABOLIC PANEL WITH GFR
BUN/Creatinine Ratio: 26 (calc) — ABNORMAL HIGH (ref 6–22)
BUN: 26 mg/dL — ABNORMAL HIGH (ref 7–25)
CO2: 26 mmol/L (ref 20–32)
Calcium: 9.4 mg/dL (ref 8.6–10.4)
Chloride: 104 mmol/L (ref 98–110)
Creat: 0.99 mg/dL — ABNORMAL HIGH (ref 0.60–0.95)
Glucose, Bld: 94 mg/dL (ref 65–99)
Potassium: 4.5 mmol/L (ref 3.5–5.3)
Sodium: 139 mmol/L (ref 135–146)
eGFR: 53 mL/min/{1.73_m2} — ABNORMAL LOW (ref >=60)

## 2021-06-17 LAB — CBC
HCT: 40.7 % (ref 35.0–45.0)
Hemoglobin: 13.5 g/dL (ref 11.7–15.5)
MCH: 30.5 pg (ref 27.0–33.0)
MCHC: 33.2 g/dL (ref 32.0–36.0)
MCV: 91.9 fL (ref 80.0–100.0)
MPV: 10.6 fL (ref 7.5–12.5)
Platelets: 237 10*3/uL (ref 140–400)
RBC: 4.43 10*6/uL (ref 3.80–5.10)
RDW: 12.1 % (ref 11.0–15.0)
WBC: 7 10*3/uL (ref 3.8–10.8)

## 2021-06-30 ENCOUNTER — Other Ambulatory Visit: Payer: Self-pay | Admitting: *Deleted

## 2021-06-30 DIAGNOSIS — F5101 Primary insomnia: Secondary | ICD-10-CM

## 2021-06-30 NOTE — Telephone Encounter (Signed)
Evelyn [name], daughter called and requested refill.  Pended Rx and sent to Dr. Hyacinth Meeker for approval.

## 2021-07-01 MED ORDER — ZOLPIDEM TARTRATE 10 MG PO TABS: ORAL_TABLET | ORAL | 1 refills | Status: DC

## 2021-11-05 ENCOUNTER — Emergency Department (HOSPITAL_COMMUNITY): Payer: Medicare Other

## 2021-11-05 ENCOUNTER — Inpatient Hospital Stay (HOSPITAL_COMMUNITY)
Admission: EM | Admit: 2021-11-05 | Discharge: 2021-11-10 | DRG: 689 | Disposition: A | Discharge status: Skilled Nursing Facility | Payer: Medicare Other | Attending: Internal Medicine | Admitting: Internal Medicine

## 2021-11-05 ENCOUNTER — Other Ambulatory Visit: Payer: Self-pay

## 2021-11-05 ENCOUNTER — Inpatient Hospital Stay (HOSPITAL_COMMUNITY): Payer: Medicare Other

## 2021-11-05 ENCOUNTER — Encounter (HOSPITAL_COMMUNITY): Payer: Self-pay | Admitting: Oncology

## 2021-11-05 DIAGNOSIS — K274 Chronic or unspecified peptic ulcer, site unspecified, with hemorrhage: Secondary | ICD-10-CM | POA: Diagnosis present

## 2021-11-05 DIAGNOSIS — M8468XA Pathological fracture in other disease, other site, initial encounter for fracture: Secondary | ICD-10-CM | POA: Diagnosis present

## 2021-11-05 DIAGNOSIS — M549 Dorsalgia, unspecified: Secondary | ICD-10-CM | POA: Diagnosis present

## 2021-11-05 DIAGNOSIS — Z8711 Personal history of peptic ulcer disease: Secondary | ICD-10-CM | POA: Diagnosis not present

## 2021-11-05 DIAGNOSIS — Z9181 History of falling: Secondary | ICD-10-CM | POA: Diagnosis not present

## 2021-11-05 DIAGNOSIS — R531 Weakness: Secondary | ICD-10-CM | POA: Diagnosis not present

## 2021-11-05 DIAGNOSIS — R1084 Generalized abdominal pain: Principal | ICD-10-CM

## 2021-11-05 DIAGNOSIS — Z9842 Cataract extraction status, left eye: Secondary | ICD-10-CM

## 2021-11-05 DIAGNOSIS — R4189 Other symptoms and signs involving cognitive functions and awareness: Secondary | ICD-10-CM | POA: Diagnosis not present

## 2021-11-05 DIAGNOSIS — R410 Disorientation, unspecified: Secondary | ICD-10-CM | POA: Diagnosis present

## 2021-11-05 DIAGNOSIS — I129 Hypertensive chronic kidney disease with stage 1 through stage 4 chronic kidney disease, or unspecified chronic kidney disease: Secondary | ICD-10-CM | POA: Diagnosis present

## 2021-11-05 DIAGNOSIS — E872 Acidosis, unspecified: Secondary | ICD-10-CM | POA: Diagnosis present

## 2021-11-05 DIAGNOSIS — N3000 Acute cystitis without hematuria: Secondary | ICD-10-CM | POA: Diagnosis not present

## 2021-11-05 DIAGNOSIS — K449 Diaphragmatic hernia without obstruction or gangrene: Secondary | ICD-10-CM | POA: Diagnosis not present

## 2021-11-05 DIAGNOSIS — D631 Anemia in chronic kidney disease: Secondary | ICD-10-CM | POA: Diagnosis present

## 2021-11-05 DIAGNOSIS — M4802 Spinal stenosis, cervical region: Secondary | ICD-10-CM | POA: Diagnosis not present

## 2021-11-05 DIAGNOSIS — R9082 White matter disease, unspecified: Secondary | ICD-10-CM | POA: Diagnosis not present

## 2021-11-05 DIAGNOSIS — S22000A Wedge compression fracture of unspecified thoracic vertebra, initial encounter for closed fracture: Secondary | ICD-10-CM | POA: Diagnosis not present

## 2021-11-05 DIAGNOSIS — W19XXXA Unspecified fall, initial encounter: Secondary | ICD-10-CM | POA: Diagnosis present

## 2021-11-05 DIAGNOSIS — R4182 Altered mental status, unspecified: Secondary | ICD-10-CM | POA: Diagnosis not present

## 2021-11-05 DIAGNOSIS — Z79899 Other long term (current) drug therapy: Secondary | ICD-10-CM

## 2021-11-05 DIAGNOSIS — R195 Other fecal abnormalities: Secondary | ICD-10-CM

## 2021-11-05 DIAGNOSIS — D649 Anemia, unspecified: Secondary | ICD-10-CM

## 2021-11-05 DIAGNOSIS — I672 Cerebral atherosclerosis: Secondary | ICD-10-CM | POA: Diagnosis not present

## 2021-11-05 DIAGNOSIS — Z741 Need for assistance with personal care: Secondary | ICD-10-CM | POA: Diagnosis not present

## 2021-11-05 DIAGNOSIS — G9341 Metabolic encephalopathy: Secondary | ICD-10-CM | POA: Insufficient documentation

## 2021-11-05 DIAGNOSIS — R9431 Abnormal electrocardiogram [ECG] [EKG]: Secondary | ICD-10-CM | POA: Diagnosis not present

## 2021-11-05 DIAGNOSIS — E785 Hyperlipidemia, unspecified: Secondary | ICD-10-CM | POA: Diagnosis present

## 2021-11-05 DIAGNOSIS — N390 Urinary tract infection, site not specified: Secondary | ICD-10-CM | POA: Diagnosis not present

## 2021-11-05 DIAGNOSIS — K648 Other hemorrhoids: Secondary | ICD-10-CM | POA: Diagnosis present

## 2021-11-05 DIAGNOSIS — I10 Essential (primary) hypertension: Secondary | ICD-10-CM

## 2021-11-05 DIAGNOSIS — K279 Peptic ulcer, site unspecified, unspecified as acute or chronic, without hemorrhage or perforation: Secondary | ICD-10-CM | POA: Diagnosis not present

## 2021-11-05 DIAGNOSIS — H5461 Unqualified visual loss, right eye, normal vision left eye: Secondary | ICD-10-CM | POA: Diagnosis not present

## 2021-11-05 DIAGNOSIS — S32030D Wedge compression fracture of third lumbar vertebra, subsequent encounter for fracture with routine healing: Secondary | ICD-10-CM | POA: Diagnosis not present

## 2021-11-05 DIAGNOSIS — R41841 Cognitive communication deficit: Secondary | ICD-10-CM | POA: Diagnosis not present

## 2021-11-05 DIAGNOSIS — Z66 Do not resuscitate: Secondary | ICD-10-CM | POA: Diagnosis present

## 2021-11-05 DIAGNOSIS — E876 Hypokalemia: Secondary | ICD-10-CM

## 2021-11-05 DIAGNOSIS — I959 Hypotension, unspecified: Secondary | ICD-10-CM | POA: Diagnosis not present

## 2021-11-05 DIAGNOSIS — K5909 Other constipation: Secondary | ICD-10-CM | POA: Diagnosis present

## 2021-11-05 DIAGNOSIS — G8929 Other chronic pain: Secondary | ICD-10-CM | POA: Diagnosis present

## 2021-11-05 DIAGNOSIS — Z961 Presence of intraocular lens: Secondary | ICD-10-CM | POA: Diagnosis not present

## 2021-11-05 DIAGNOSIS — B9620 Unspecified Escherichia coli [E. coli] as the cause of diseases classified elsewhere: Secondary | ICD-10-CM | POA: Diagnosis present

## 2021-11-05 DIAGNOSIS — M1990 Unspecified osteoarthritis, unspecified site: Secondary | ICD-10-CM | POA: Diagnosis not present

## 2021-11-05 DIAGNOSIS — I6529 Occlusion and stenosis of unspecified carotid artery: Secondary | ICD-10-CM | POA: Diagnosis not present

## 2021-11-05 DIAGNOSIS — R2681 Unsteadiness on feet: Secondary | ICD-10-CM | POA: Diagnosis not present

## 2021-11-05 DIAGNOSIS — M2578 Osteophyte, vertebrae: Secondary | ICD-10-CM | POA: Diagnosis not present

## 2021-11-05 DIAGNOSIS — F32A Depression, unspecified: Secondary | ICD-10-CM | POA: Diagnosis not present

## 2021-11-05 DIAGNOSIS — R197 Diarrhea, unspecified: Secondary | ICD-10-CM | POA: Diagnosis not present

## 2021-11-05 DIAGNOSIS — N1831 Chronic kidney disease, stage 3a: Secondary | ICD-10-CM | POA: Diagnosis present

## 2021-11-05 DIAGNOSIS — M6281 Muscle weakness (generalized): Secondary | ICD-10-CM | POA: Diagnosis not present

## 2021-11-05 DIAGNOSIS — S22010D Wedge compression fracture of first thoracic vertebra, subsequent encounter for fracture with routine healing: Secondary | ICD-10-CM | POA: Diagnosis not present

## 2021-11-05 DIAGNOSIS — K7689 Other specified diseases of liver: Secondary | ICD-10-CM | POA: Diagnosis not present

## 2021-11-05 DIAGNOSIS — K921 Melena: Secondary | ICD-10-CM

## 2021-11-05 DIAGNOSIS — Z90710 Acquired absence of both cervix and uterus: Secondary | ICD-10-CM

## 2021-11-05 DIAGNOSIS — H5440 Blindness, one eye, unspecified eye: Secondary | ICD-10-CM | POA: Diagnosis not present

## 2021-11-05 DIAGNOSIS — R110 Nausea: Secondary | ICD-10-CM | POA: Diagnosis not present

## 2021-11-05 DIAGNOSIS — S199XXA Unspecified injury of neck, initial encounter: Secondary | ICD-10-CM | POA: Diagnosis not present

## 2021-11-05 DIAGNOSIS — R42 Dizziness and giddiness: Secondary | ICD-10-CM | POA: Diagnosis not present

## 2021-11-05 DIAGNOSIS — N281 Cyst of kidney, acquired: Secondary | ICD-10-CM | POA: Diagnosis not present

## 2021-11-05 DIAGNOSIS — Y92009 Unspecified place in unspecified non-institutional (private) residence as the place of occurrence of the external cause: Secondary | ICD-10-CM

## 2021-11-05 DIAGNOSIS — K5904 Chronic idiopathic constipation: Secondary | ICD-10-CM | POA: Diagnosis not present

## 2021-11-05 DIAGNOSIS — M545 Low back pain, unspecified: Secondary | ICD-10-CM | POA: Diagnosis not present

## 2021-11-05 DIAGNOSIS — G4700 Insomnia, unspecified: Secondary | ICD-10-CM | POA: Diagnosis not present

## 2021-11-05 DIAGNOSIS — Z7401 Bed confinement status: Secondary | ICD-10-CM | POA: Diagnosis not present

## 2021-11-05 DIAGNOSIS — K589 Irritable bowel syndrome without diarrhea: Secondary | ICD-10-CM | POA: Diagnosis not present

## 2021-11-05 HISTORY — DX: Gastric ulcer, unspecified as acute or chronic, without hemorrhage or perforation: K25.9

## 2021-11-05 LAB — URINALYSIS, ROUTINE W REFLEX MICROSCOPIC
Bilirubin Urine: NEGATIVE
Glucose, UA: NEGATIVE mg/dL
Hgb urine dipstick: NEGATIVE
Ketones, ur: NEGATIVE mg/dL
Nitrite: POSITIVE — AB
Protein, ur: NEGATIVE mg/dL
Specific Gravity, Urine: 1.012 (ref 1.005–1.030)
pH: 6 (ref 5.0–8.0)

## 2021-11-05 LAB — COMPREHENSIVE METABOLIC PANEL
ALT: 28 U/L (ref 0–44)
AST: 42 U/L — ABNORMAL HIGH (ref 15–41)
Albumin: 3.5 g/dL (ref 3.5–5.0)
Alkaline Phosphatase: 87 U/L (ref 38–126)
Anion gap: 8 (ref 5–15)
BUN: 41 mg/dL — ABNORMAL HIGH (ref 8–23)
CO2: 19 mmol/L — ABNORMAL LOW (ref 22–32)
Calcium: 9.4 mg/dL (ref 8.9–10.3)
Chloride: 112 mmol/L — ABNORMAL HIGH (ref 98–111)
Creatinine, Ser: 1.03 mg/dL — ABNORMAL HIGH (ref 0.44–1.00)
GFR, Estimated: 50 mL/min — ABNORMAL LOW (ref >60)
Glucose, Bld: 104 mg/dL — ABNORMAL HIGH (ref 70–99)
Potassium: 4.2 mmol/L (ref 3.5–5.1)
Sodium: 139 mmol/L (ref 135–145)
Total Bilirubin: 0.6 mg/dL (ref 0.3–1.2)
Total Protein: 6.8 g/dL (ref 6.5–8.1)

## 2021-11-05 LAB — CBC
HCT: 30 % — ABNORMAL LOW (ref 36.0–46.0)
Hemoglobin: 9.8 g/dL — ABNORMAL LOW (ref 12.0–15.0)
MCH: 31.3 pg (ref 26.0–34.0)
MCHC: 32.7 g/dL (ref 30.0–36.0)
MCV: 95.8 fL (ref 80.0–100.0)
Platelets: 230 10*3/uL (ref 150–400)
RBC: 3.13 MIL/uL — ABNORMAL LOW (ref 3.87–5.11)
RDW: 14.2 % (ref 11.5–15.5)
WBC: 7.9 10*3/uL (ref 4.0–10.5)
nRBC: 0 % (ref 0.0–0.2)

## 2021-11-05 LAB — LACTIC ACID, PLASMA: Lactic Acid, Venous: 2.9 mmol/L (ref 0.5–1.9)

## 2021-11-05 LAB — TROPONIN I (HIGH SENSITIVITY)
Troponin I (High Sensitivity): 7 ng/L (ref <18)
Troponin I (High Sensitivity): 10 ng/L (ref <18)

## 2021-11-05 LAB — POC OCCULT BLOOD, ED: Fecal Occult Bld: POSITIVE — AB

## 2021-11-05 LAB — LIPASE, BLOOD: Lipase: 77 U/L — ABNORMAL HIGH (ref 11–51)

## 2021-11-05 MED ORDER — IOHEXOL 300 MG/ML  SOLN
75.0000 mL | Freq: Once | INTRAMUSCULAR | Status: AC | PRN
  Administered 2021-11-05: 75 mL via INTRAVENOUS

## 2021-11-05 MED ORDER — SODIUM CHLORIDE 0.9 % IV BOLUS
1000.0000 mL | Freq: Once | INTRAVENOUS | Status: AC
  Administered 2021-11-05: 1000 mL via INTRAVENOUS

## 2021-11-05 MED ORDER — SODIUM CHLORIDE 0.9 % IV SOLN
1.0000 g | Freq: Once | INTRAVENOUS | Status: AC
  Administered 2021-11-05: 1 g via INTRAVENOUS
  Filled 2021-11-05: qty 10

## 2021-11-05 MED ORDER — FENTANYL CITRATE PF 50 MCG/ML IJ SOSY
25.0000 ug | PREFILLED_SYRINGE | Freq: Once | INTRAMUSCULAR | Status: AC
  Administered 2021-11-05: 25 ug via INTRAVENOUS
  Filled 2021-11-05: qty 1

## 2021-11-05 MED ORDER — SODIUM CHLORIDE 0.9 % IV SOLN: INTRAVENOUS | Status: DC

## 2021-11-05 MED ORDER — IOHEXOL 300 MG/ML  SOLN
80.0000 mL | Freq: Once | INTRAMUSCULAR | Status: AC | PRN
  Administered 2021-11-05: 80 mL via INTRAVENOUS

## 2021-11-05 NOTE — Progress Notes (Signed)
Daughter at bedside- Misty Stanley Hipp

## 2021-11-05 NOTE — ED Provider Notes (Signed)
[facility]-EMERGENCY DEPT Provider Note   CSN: [id number] Arrival date & time: 11/05/21  1513     History  Chief Complaint  Patient presents with   Abdominal Pain    Evelyn [name] is a 88 y.o. female.  Patient with history of arthritis, hyperlipidemia, UTI presenting with generalized weakness, fatigue and diarrhea.  States she had diarrhea several days ago earlier in the week which has resolved after taking Imodium.  Since then however she been feeling weak, fatigued with diffuse abdominal pain.  She apparently had a fall versus syncopal episode last night when she woke up on the ground does not know how she got there with her walker knocked over.  Denies any injury from the fall other than her chronic back pain.  Family reports poor p.o. intake today.  Has had no vomiting or fever.  No pain with urination or blood in the urine.  No chest pain or shortness of breath  The history is provided by the patient.  Abdominal Pain Associated symptoms: diarrhea, fatigue and nausea   Associated symptoms: no cough, no dysuria, no hematuria, no shortness of breath and no vomiting       Home Medications Prior to Admission medications   Medication Sig Start Date End Date Taking? Authorizing Provider  FLUoxetine (PROZAC) 20 MG capsule Take 40 mg by mouth daily.    [provider]  polyethylene glycol (MIRALAX / GLYCOLAX) 17 g packet Take 17 g by mouth as needed.    [provider]  traMADol (ULTRAM) 50 MG tablet Take 50 mg by mouth every 6 (six) hours as needed for moderate pain (For Back Pain).    [provider]  zolpidem (AMBIEN) 10 MG tablet Take 1/2 tablet by mouth once daily at bedtime as needed for sleep. 07/01/21   Frederica KusterMiller, Oswin Griffith M, MD      Allergies    Patient has no known allergies.    Review of Systems   Review of Systems  Constitutional:  Positive for activity change, appetite change and fatigue.  HENT:  Negative for congestion.    Respiratory:  Negative for cough, chest tightness and shortness of breath.   Gastrointestinal:  Positive for abdominal pain, diarrhea and nausea. Negative for vomiting.  Genitourinary:  Negative for dysuria and hematuria.  Musculoskeletal:  Positive for arthralgias, back pain and myalgias.  Skin:  Negative for rash.  Neurological:  Positive for weakness. Negative for dizziness and headaches.   all other systems are negative except as noted in the HPI and PMH.   Physical Exam Updated Vital Signs BP (!) 160/72   Pulse 65   Temp 98.1 F (36.7 C) (Oral)   Resp 18   SpO2 100%  Physical Exam Vitals and nursing note reviewed.  Constitutional:      General: She is not in acute distress.    Appearance: She is well-developed.  HENT:     Head: Normocephalic and atraumatic.     Mouth/Throat:     Pharynx: No oropharyngeal exudate.  Eyes:     Conjunctiva/sclera: Conjunctivae normal.     Pupils: Pupils are equal, round, and reactive to light.  Neck:     Comments: No meningismus. Cardiovascular:     Rate and Rhythm: Normal rate and regular rhythm.     Heart sounds: Normal heart sounds. No murmur heard. Pulmonary:     Effort: Pulmonary effort is normal. No respiratory distress.     Breath sounds: Normal breath sounds.  Chest:  Chest wall: No tenderness.  Abdominal:     Palpations: Abdomen is soft.     Tenderness: There is abdominal tenderness. There is guarding. There is no rebound.     Comments: Diffuse lower tenderness with voluntary guarding.  Genitourinary:    Comments: No hemorrhoids or fissures, no gross blood Musculoskeletal:        General: No tenderness. Normal range of motion.     Cervical back: Normal range of motion and neck supple.  Skin:    General: Skin is warm.     Capillary Refill: Capillary refill takes less than 2 seconds.  Neurological:     General: No focal deficit present.     Mental Status: She is alert and oriented to person, place, and time. Mental  status is at baseline.     Cranial Nerves: No cranial nerve deficit.     Motor: No abnormal muscle tone.     Coordination: Coordination normal.     Comments:  5/5 strength throughout. CN 2-12 intact.Equal grip strength.   Psychiatric:        Behavior: Behavior normal.    ED Results / Procedures / Treatments   Labs (all labs ordered are listed, but only abnormal results are displayed) Labs Reviewed  LIPASE, BLOOD - Abnormal; Notable for the following components:      Result Value   Lipase 77 (*)    All other components within normal limits  COMPREHENSIVE METABOLIC PANEL - Abnormal; Notable for the following components:   Chloride 112 (*)    CO2 19 (*)    Glucose, Bld 104 (*)    BUN 41 (*)    Creatinine, Ser 1.03 (*)    AST 42 (*)    GFR, Estimated 50 (*)    All other components within normal limits  CBC - Abnormal; Notable for the following components:   RBC 3.13 (*)    Hemoglobin 9.8 (*)    HCT 30.0 (*)    All other components within normal limits  URINALYSIS, ROUTINE W REFLEX MICROSCOPIC - Abnormal; Notable for the following components:   APPearance HAZY (*)    Nitrite POSITIVE (*)    Leukocytes,Ua LARGE (*)    Bacteria, UA MANY (*)    Non Squamous Epithelial 0-5 (*)    All other components within normal limits  LACTIC ACID, PLASMA - Abnormal; Notable for the following components:   Lactic Acid, Venous 2.9 (*)    All other components within normal limits  POC OCCULT BLOOD, ED - Abnormal; Notable for the following components:   Fecal Occult Bld POSITIVE (*)    All other components within normal limits  URINE CULTURE  CULTURE, BLOOD (ROUTINE X 2)  CULTURE, BLOOD (ROUTINE X 2)  LACTIC ACID, PLASMA  CBC  BASIC METABOLIC PANEL  TROPONIN I (HIGH SENSITIVITY)  TROPONIN I (HIGH SENSITIVITY)    EKG EKG Interpretation  Date/Time:  Thursday Nov 05 2021 15:25:25 EDT Ventricular Rate:  69 PR Interval:  116 QRS Duration: 92 QT Interval:  400 QTC Calculation: 429 R  Axis:   61 Text Interpretation: Sinus rhythm Borderline short PR interval Low voltage, precordial leads No significant change was found Confirmed by Glynn Octave [phone]) on 11/05/2021 7:15:13 PM  Radiology CT Head Wo Contrast  Result Date: 11/05/2021 CLINICAL DATA:  Unwitnessed fall. EXAM: CT HEAD WITHOUT CONTRAST TECHNIQUE: Contiguous axial images were obtained from the base of the skull through the vertex without intravenous contrast. RADIATION DOSE REDUCTION: This exam was performed according  to the departmental dose-optimization program which includes automated exposure control, adjustment of the mA and/or kV according to patient size and/or use of iterative reconstruction technique. COMPARISON:  MRI February 07, 2017 FINDINGS: Brain: No evidence of acute infarction, hemorrhage, hydrocephalus, extra-axial collection or mass lesion/mass effect. Mild age-related global parenchymal volume loss. Similar asymmetry of the lateral ventricles. Mild burden of chronic ischemic white matter disease. Vascular: No hyperdense vessel. Atherosclerosis of the internal carotid and vertebral arteries. Skull: Normal. Negative for fracture or focal lesion. Sinuses/Orbits: Visualized portions of the paranasal sinuses are predominantly clear. Prior lens surgery. Other: Mastoid air cells are predominantly clear. Probable dense cerumen in the bilateral external auditory canals. IMPRESSION: No acute intracranial abnormality. Electronically Signed   By: Maudry Mayhew M.D.   On: 11/05/2021 16:30   CT Cervical Spine Wo Contrast  Result Date: 11/05/2021 CLINICAL DATA:  Neck trauma. Fell. Contemporaneous head CT negative for acute abnormality. EXAM: CT CERVICAL SPINE WITHOUT CONTRAST TECHNIQUE: Multidetector CT imaging of the cervical spine was performed without intravenous contrast. Multiplanar CT image reconstructions were also generated. RADIATION DOSE REDUCTION: This exam was performed according to the departmental  dose-optimization program which includes automated exposure control, adjustment of the mA and/or kV according to patient size and/or use of iterative reconstruction technique. COMPARISON:  None Available. FINDINGS: Alignment: There is a minimal 1-2 mm anterolisthesis at C2-3, C3-4, C4-5 and C7-T1, 2 mm anterolisthesis T1-2, all most likely degenerative etiology. No traumatic listhesis is suspected. There is narrowing and osteophyte formation at the anterior atlantodental joint. Skull base and vertebrae: Osteopenia. The skull base, mastoid air cells and middle ears are unremarkable. There is mild acute upper plate anterior wedge compression fracture of the T1 vertebral body with compaction of trabecular elements in the superior vertebral body, slight posterosuperior cortical retropulsion, and nondisplaced fragmentation along the anterior superior vertebral body. Loss of anterior height at T1 is no more than 20%, posterior height loss no more than 10%. No cervical fracture is evident and no primary bone lesion is suspected. Soft tissues and spinal canal: There is mild soft tissue reaction in the anterior prevertebral plane alongside T1 but no space-occupying hematoma. There is no further prevertebral swelling or fluid. No visible canal hematoma. There are moderate calcifications in both carotid bulbs. Disc levels: No herniated discs or cord compromise are noted at any level including T1. There is multilevel facet joint and uncinate spurring. There is partial disc space loss C3-4, C5-6 and C6-7 and slight disc narrowing C4-5. The other discs are normal in heights. There are small posterior osteophytes at C3-4 without stenosis, small bidirectional osteophytes at C5-6 and C6-7 also without stenosis. No cervical levels demonstrate critical foraminal stenosis. There is mild foraminal stenosis on the right at C5-6. Upper chest: Lung apices demonstrate interstitial reticulation which is most likely due to chronic  interstitial disease, less likely could indicate edema in the proper clinical setting. Other: There is asymmetric joint space loss and spurring of the left TMJ. Incidental note is made of bilateral mandibular tori. There is advanced carious decay of the right maxillary 6 year molar. Follow-up with a dentist is recommended. IMPRESSION: 1. Acute mild upper plate anterior wedge compression fracture T1 vertebral body. 2. Slight posterosuperior cortical retropulsion with 20% anterior and 10% posterior height loss, with only mild prevertebral soft tissue reaction and no intracanalicular or paraspinal hematoma. 3. Osteopenia and degenerative change without evidence of cervical fractures. 4. Interstitial reticulation in the lung apices which is most likely chronic, less likely  could be interstitial edema. 5. Carotid atherosclerosis. 6. Severe decay of the right maxillary 6 year molar. Follow-up with a dentist is recommended. 7. Asymmetric left TMJ DJD. 8. Discussed over the phone with Dr. Manus Gunning at 9:04 p.m., 11/05/2021 with acknowledgement of findings. Electronically Signed   By: Almira Bar M.D.   On: 11/05/2021 21:05   CT ABDOMEN PELVIS W CONTRAST  Result Date: 11/05/2021 CLINICAL DATA:  Acute abdominal pain EXAM: CT ABDOMEN AND PELVIS WITH CONTRAST TECHNIQUE: Multidetector CT imaging of the abdomen and pelvis was performed using the standard protocol following bolus administration of intravenous contrast. RADIATION DOSE REDUCTION: This exam was performed according to the departmental dose-optimization program which includes automated exposure control, adjustment of the mA and/or kV according to patient size and/or use of iterative reconstruction technique. CONTRAST:  29mL OMNIPAQUE IOHEXOL 300 MG/ML  SOLN COMPARISON:  12/08/2007 FINDINGS: Motion degraded images. Lower chest: Lung bases are clear. Hepatobiliary: 11 mm cyst in the central right liver (series 11/image 5). Calcified hepatic granuloma. Liver is  otherwise within normal limits. Gallbladder is unremarkable. No intrahepatic or extrahepatic ductal dilatation. Pancreas: Within normal limits. Spleen: Calcified splenic granuloma. Adrenals/Urinary Tract: Adrenal glands are within normal limits. Bilateral renal cysts, measuring up to 4.7 cm in the left upper kidney (series 11/image 11). No hydronephrosis. Bladder is within normal limits. Stomach/Bowel: Stomach is notable for a moderate hiatal hernia, incompletely visualized. No evidence of bowel obstruction. Appendix is poorly visualized but grossly unremarkable (series 2/image 52). No colonic wall thickening or inflammatory changes. Vascular/Lymphatic: No evidence of abdominal aortic aneurysm. Atherosclerotic calcifications of the abdominal aorta and branch vessels. No suspicious abdominopelvic lymphadenopathy. Reproductive: Status post hysterectomy. No adnexal masses. Other: No abdominopelvic ascites. Musculoskeletal: Mild superior endplate compression fracture deformity at T12 and L3, age indeterminate but likely chronic. No retropulsion. IMPRESSION: No CT findings to account for the patient's acute abdominal pain. Additional ancillary findings as above. Electronically Signed   By: Charline Bills M.D.   On: 11/05/2021 20:53    Procedures .Critical Care Performed by: Glynn Octave, MD Authorized by: Glynn Octave, MD   Critical care provider statement:    Critical care time (minutes):  40   Critical care was necessary to treat or prevent imminent or life-threatening deterioration of the following conditions: GI bleed.   Critical care was time spent personally by me on the following activities:  Development of treatment plan with patient or surrogate, discussions with consultants, evaluation of patient's response to treatment, examination of patient, ordering and review of laboratory studies, ordering and review of radiographic studies, ordering and performing treatments and interventions, pulse  oximetry, re-evaluation of patient's condition and review of old charts   I assumed direction of critical care for this patient from another provider in my specialty: no     Care discussed with: admitting provider      Medications Ordered in ED Medications  0.9 %  sodium chloride infusion ( Intravenous New Bag/Given 11/05/21 1932)  sodium chloride 0.9 % bolus 1,000 mL (1,000 mLs Intravenous New Bag/Given 11/05/21 1929)    ED Course/ Medical Decision Making/ A&P                           Fatigue, generalized weakness, diarrhea with poor p.o. intake.  Syncope versus fall last night.  Nonfocal neurological exam  Patient initially was feeling well but then developed pretty significant abdominal pain throughout her ED stay.  Urinalysis is positive for infection. CT  abdomen does not show any acute pathology but does show chronic compression fractures with no explanation for her abdominal pain.  Given her unwitnessed fall, CT head and C-spine were obtained.  This shows a thoracic compression fracture approximately 20% with some retropulsion.  Discussed with NP [facility] neurosurgery who agrees no brace or C-spine collar needed.  There is some concern for patient's unwitnessed fall versus syncope.  Unclear etiology of her abdominal pain.  May have related to her UTI.  Also has had dark stools that are Hemoccult positive with declining hemoglobin. Vitals remain stable. 3 g drop in hemoglobin since Dec. No acute findings on CT abdomen and pelvis, results reviewed and interpreted by me. Initiate PPI.  However, abdominal pain is getting worse, elevated lactate, CTA to be completed to evaluate for mesenteric ischemia.  Admit for UTI, worsening abdominal pain with anemia and possible GI bleed. D/w Dr. Cyndia Bent.        Final Clinical Impression(s) / ED Diagnoses Final diagnoses:  None    Rx / DC Orders ED Discharge Orders     None         Nas Wafer, Jeannett Senior, MD 11/06/21 [phone]

## 2021-11-05 NOTE — ED Provider Triage Note (Signed)
Emergency Medicine Provider Triage Evaluation Note  Evelyn [name] , a 88 y.o. female  was evaluated in triage.  Pt complains of 3 days of diarrhea that is gradually improved, but is still present.  She also woke up on the floor in the middle of the night, got back up and went back to bed.  Does not know how she ended up there.  Denies any bodily pains, head pain, shortness of breath, recent infection, dizziness, lightheadedness.  Believes she is dehydrated.  Hx of HLD, chronic cystitis, depression, insomnia, chronic constipation, IBS.  Not on anticoagulation.  Review of Systems  Positive:  Negative: As above  Physical Exam  BP 136/79 (BP Location: Right Arm)   Pulse 72   Temp 98.5 F (36.9 C) (Oral)   Resp 15   SpO2 100%  Gen:   Awake, no distress, presents in wheelchair Resp:  Normal effort, CTAB MSK:   Moves extremities without difficulty  Other:  RRR without M/R/G.  Mild generalized abdominal tenderness.  Appears clinically dehydrated.  No evidence of trauma such as hemorrhage, wounds, bruising, bony tenderness.  Medical Decision Making  Medically screening exam initiated at 3:32 PM.  Appropriate orders placed.  Evelyn [name] was informed that the remainder of the evaluation will be completed by another provider, this initial triage assessment does not replace that evaluation, and the importance of remaining in the ED until their evaluation is complete.  Labs, imaging, EKG ordered   Evelyn [name], Evelyn [name] 11/05/21 1549

## 2021-11-05 NOTE — ED Triage Notes (Signed)
Pt c/o diarrhea that began on Monday. Diarrhea has tapered off however still present. Pt endorsing weakness, fatigue and abdominal pain. Pt reports to EMS that last night she woke up on the floor w/ a lamp knocked over and walker knocked over. Unknown time on the floor. Daughter reports poor PO intake.

## 2021-11-05 NOTE — Progress Notes (Signed)
With daughter

## 2021-11-06 ENCOUNTER — Encounter (HOSPITAL_COMMUNITY): Payer: Self-pay | Admitting: Family Medicine

## 2021-11-06 DIAGNOSIS — R195 Other fecal abnormalities: Secondary | ICD-10-CM | POA: Diagnosis not present

## 2021-11-06 DIAGNOSIS — K921 Melena: Secondary | ICD-10-CM

## 2021-11-06 DIAGNOSIS — E872 Acidosis, unspecified: Secondary | ICD-10-CM

## 2021-11-06 DIAGNOSIS — N3000 Acute cystitis without hematuria: Secondary | ICD-10-CM | POA: Diagnosis not present

## 2021-11-06 DIAGNOSIS — K279 Peptic ulcer, site unspecified, unspecified as acute or chronic, without hemorrhage or perforation: Secondary | ICD-10-CM

## 2021-11-06 DIAGNOSIS — R4189 Other symptoms and signs involving cognitive functions and awareness: Secondary | ICD-10-CM

## 2021-11-06 DIAGNOSIS — D649 Anemia, unspecified: Secondary | ICD-10-CM

## 2021-11-06 DIAGNOSIS — S22000A Wedge compression fracture of unspecified thoracic vertebra, initial encounter for closed fracture: Secondary | ICD-10-CM

## 2021-11-06 LAB — CBC
HCT: 25.2 % — ABNORMAL LOW (ref 36.0–46.0)
Hemoglobin: 8.1 g/dL — ABNORMAL LOW (ref 12.0–15.0)
MCH: 31.2 pg (ref 26.0–34.0)
MCHC: 32.1 g/dL (ref 30.0–36.0)
MCV: 96.9 fL (ref 80.0–100.0)
Platelets: 159 10*3/uL (ref 150–400)
RBC: 2.6 MIL/uL — ABNORMAL LOW (ref 3.87–5.11)
RDW: 14.3 % (ref 11.5–15.5)
WBC: 6.3 10*3/uL (ref 4.0–10.5)
nRBC: 0 % (ref 0.0–0.2)

## 2021-11-06 LAB — BASIC METABOLIC PANEL
Anion gap: 5 (ref 5–15)
BUN: 33 mg/dL — ABNORMAL HIGH (ref 8–23)
CO2: 20 mmol/L — ABNORMAL LOW (ref 22–32)
Calcium: 8.3 mg/dL — ABNORMAL LOW (ref 8.9–10.3)
Chloride: 117 mmol/L — ABNORMAL HIGH (ref 98–111)
Creatinine, Ser: 0.94 mg/dL (ref 0.44–1.00)
GFR, Estimated: 56 mL/min — ABNORMAL LOW (ref >60)
Glucose, Bld: 95 mg/dL (ref 70–99)
Potassium: 3.7 mmol/L (ref 3.5–5.1)
Sodium: 142 mmol/L (ref 135–145)

## 2021-11-06 LAB — LACTIC ACID, PLASMA: Lactic Acid, Venous: 1.8 mmol/L (ref 0.5–1.9)

## 2021-11-06 MED ORDER — SODIUM CHLORIDE 0.9 % IV SOLN: INTRAVENOUS | Status: AC

## 2021-11-06 MED ORDER — MORPHINE SULFATE (PF) 2 MG/ML IV SOLN
1.0000 mg | INTRAVENOUS | Status: DC | PRN
  Administered 2021-11-06: 1 mg via INTRAVENOUS
  Filled 2021-11-06: qty 1

## 2021-11-06 MED ORDER — PANTOPRAZOLE SODIUM 40 MG IV SOLR
40.0000 mg | Freq: Two times a day (BID) | INTRAVENOUS | Status: DC
  Administered 2021-11-06 – 2021-11-07 (×4): 40 mg via INTRAVENOUS
  Filled 2021-11-06 (×4): qty 10

## 2021-11-06 MED ORDER — FLUOXETINE HCL 20 MG PO CAPS
20.0000 mg | ORAL_CAPSULE | Freq: Every day | ORAL | Status: DC
Start: 2021-11-06 — End: 2021-11-10
  Administered 2021-11-06 – 2021-11-09 (×4): 20 mg via ORAL
  Filled 2021-11-06 (×4): qty 1

## 2021-11-06 MED ORDER — ZOLPIDEM TARTRATE 5 MG PO TABS
5.0000 mg | ORAL_TABLET | Freq: Every day | ORAL | Status: DC
  Administered 2021-11-06 – 2021-11-09 (×5): 5 mg via ORAL
  Filled 2021-11-06 (×5): qty 1

## 2021-11-06 MED ORDER — PHYTONADIONE 5 MG PO TABS
10.0000 mg | ORAL_TABLET | Freq: Every day | ORAL | Status: AC
  Administered 2021-11-06 – 2021-11-08 (×3): 10 mg via ORAL
  Filled 2021-11-06 (×4): qty 2

## 2021-11-06 MED ORDER — ACETAMINOPHEN 325 MG PO TABS
650.0000 mg | ORAL_TABLET | Freq: Four times a day (QID) | ORAL | Status: DC | PRN
  Administered 2021-11-06 – 2021-11-08 (×2): 650 mg via ORAL
  Filled 2021-11-06 (×2): qty 2

## 2021-11-06 MED ORDER — SODIUM CHLORIDE 0.9 % IV SOLN
1.0000 g | INTRAVENOUS | Status: DC
  Administered 2021-11-06 – 2021-11-07 (×2): 1 g via INTRAVENOUS
  Filled 2021-11-06 (×2): qty 10

## 2021-11-06 NOTE — Assessment & Plan Note (Signed)
UA is grossly positive.  Continue IV Rocephin pending urine culture.

## 2021-11-06 NOTE — Assessment & Plan Note (Signed)
Noted to have downward trending hemoglobin from 13.5 a few months ago to 9.8 on presentation.  Has had a few days of diarrhea but denies any hematochezia or melena.  However she has history of gastric ulcer with last EGD in 12/2017 with resolution of her ulcer with Dr. Ardis Hughs.  Last routine colonoscopy in 2006 with nonbleeding internal hemorrhoids and otherwise normal colon.  Denies any NSAID use. - CTA abdomen and pelvis for any acute findings.  Abdominal pain is in bilateral lower quadrants.  No epigastric pain.  Possibly could be a small bowel bleed.  Needs GI consultation in the morning and will keep n.p.o.

## 2021-11-06 NOTE — TOC Initial Note (Signed)
Transition of Care [facility]) - Initial/Assessment Note    Patient Details  Name: Evelyn [name] MRN: [id number] Date of Birth: 1924-10-09  Transition of Care [facility]) CM/SW Contact:    Leeroy Cha, RN Phone Number: 11/06/2021, 8:35 AM  Clinical Narrative:                  Transition of Care [facility]) Screening Note   Patient Details  Name: Evelyn [name] Date of Birth: 09/22/1924   Transition of Care [facility]) CM/SW Contact:    Leeroy Cha, RN Phone Number: 11/06/2021, 8:35 AM    Transition of Care Department [facility]) has reviewed patient and no TOC needs have been identified at this time. We will continue to monitor patient advancement through interdisciplinary progression rounds. If new patient transition needs arise, please place a TOC consult.    Expected Discharge Plan: Home/Self Care Barriers to Discharge: No Barriers Identified   Patient Goals and CMS Choice Patient states their goals for this hospitalization and ongoing recovery are:: i would like to go back home CMS Medicare.gov Compare Post Acute Care list provided to:: Patient    Expected Discharge Plan and Services Expected Discharge Plan: Home/Self Care   Discharge Planning Services: CM Consult   Living arrangements for the past 2 months: Single Family Home                                      Prior Living Arrangements/Services Living arrangements for the past 2 months: Single Family Home Lives with:: Self Patient language and need for interpreter reviewed:: Yes Do you feel safe going back to the place where you live?: Yes            Criminal Activity/Legal Involvement Pertinent to Current Situation/Hospitalization: No - Comment as needed  Activities of Daily Living Home Assistive Devices/Equipment: None ADL Screening (condition at time of admission) Patient's cognitive ability adequate to safely complete daily activities?: Yes Is the patient deaf or have difficulty hearing?: No Does the  patient have difficulty seeing, even when wearing glasses/contacts?: No Does the patient have difficulty concentrating, remembering, or making decisions?: No Patient able to express need for assistance with ADLs?: Yes Does the patient have difficulty dressing or bathing?: Yes Independently performs ADLs?: No Communication: Needs assistance Is this a change from baseline?: Change from baseline, expected to last <3 days Dressing (OT): Needs assistance Is this a change from baseline?: Change from baseline, expected to last <3days Grooming: Needs assistance Is this a change from baseline?: Change from baseline, expected to last <3 days Feeding: Independent Bathing: Needs assistance Is this a change from baseline?: Change from baseline, expected to last <3 days Toileting: Needs assistance Is this a change from baseline?: Change from baseline, expected to last <3 days In/Out Bed: Needs assistance Is this a change from baseline?: Change from baseline, expected to last <3 days Walks in Home: Independent Does the patient have difficulty walking or climbing stairs?: Yes Weakness of Legs: Both Weakness of Arms/Hands: Both  Permission Sought/Granted                  Emotional Assessment Appearance:: Appears stated age     Orientation: : Oriented to Self, Oriented to Place, Oriented to  Time, Oriented to Situation Alcohol / Substance Use: Not Applicable Psych Involvement: No (comment)  Admission diagnosis:  Acute metabolic encephalopathy [id number] Patient Active Problem List   Diagnosis  Date Noted   Compression fracture of body of thoracic vertebra ([place]) 11/06/2021   Acute anemia 11/06/2021   Cognitive impairment [id number]   Acute metabolic encephalopathy [id number]   Closed wedge compression fracture of T12 vertebra ([place]) 11/24/2016   Chronic midline low back pain 11/18/2016   Chronic renal disease, stage 3, moderately decreased glomerular filtration rate (GFR) between 30-59  mL/min/1.73 square meter (HCC) 11/18/2016   IBS (irritable bowel syndrome)    UTI (urinary tract infection)    Routine general medical examination at a health care facility 05/03/2013   Chronic idiopathic constipation 03/31/2012   Other screening mammogram 03/24/2011   Depression with somatization 03/06/2010   Insomnia 03/08/2008   Hyperlipidemia with target LDL less than 130 02/21/2007   PCP:  Wardell Honour, MD Pharmacy:   [facility] [place] [place] - [address] [place] [address] Phone: [phone] Fax: [phone]     Social Determinants of Health (SDOH) Interventions    Readmission Risk Interventions     View : No data to display.

## 2021-11-06 NOTE — Assessment & Plan Note (Signed)
Acute T1 compression fracture Subacute L3 mild compression fracture Likely from baseline osteopenia -Neurosurgery was consulted by ED physician and recommends conservative management.  No intervention or bracing at this time.  Patient reports chronic back pain that is no worse than baseline at this time.

## 2021-11-06 NOTE — Assessment & Plan Note (Signed)
Alert and oriented to self, place and current event but not time

## 2021-11-06 NOTE — Progress Notes (Signed)
PROGRESS NOTE    Evelyn [name]  [id number] DOB: 01-Nov-1924 DOA: 11/05/2021 PCP: Wardell Honour, MD   Brief Narrative:  88 y.o. female with medical history significant of cognitive impairment, CKD stage IIIa, hyperlipidemia presented with abdominal pain with diarrhea.  On presentation, UA was suggestive of UTI.  Hemoglobin was 9.8 from 13.5 just 4 months ago.  CT of abdomen/pelvis showed no acute abdominal or pelvic findings.  CT of the head was negative for acute findings.  CT cervical spine showed acute compression fracture of the T1 vertebral body.  ED physician discussed with neurosurgery and no intervention or brace was advised. Recommend conservative treatment.  She was started on IV antibiotics.  Assessment & Plan:   UTI: Present on admission -Follow cultures.  Continue Rocephin  Acute anemia -Hemoglobin 9.8 from 13.54 months ago.  No obvious hematochezia or melena.  History of gastric ulcer in 2019 -CTA abdomen and pelvis without any acute findings -Continue IV Protonix.  Consulted GI.  Follow recommendations.  Keep n.p.o. for now -Hemoglobin 8.1 this morning.  Monitor H&H.  Cognitive impairment -Monitor mental status.  Patient is not oriented to time.  Fall precautions.  PT eval  Acute T1 compression fracture Subacute L3 mild compression fracture -Likely from baseline osteopenia. -Neurosurgery was consulted by ED physician and recommends conservative management.  No intervention or bracing at this time.  Patient reports chronic back pain that is no worse than baseline at this time.  Lactic acidosis -Questionable cause.  Resolved.   DVT prophylaxis: SCDs Code Status: DNR Family Communication: None at bedside Disposition Plan: Status is: Inpatient Remains inpatient appropriate because: Of severity of illness    Consultants: GI  Procedures: None  Antimicrobials: Rocephin from 11/05/2021 onwards   Subjective: Patient seen and examined at bedside.  Abdominal  pain is improving.  Feels weak.  No overnight fever, vomiting, worsening shortness of breath reported.  Objective: Vitals:   11/05/21 2204 11/05/21 2301 11/06/21 0300 11/06/21 0733  BP:  (!) 148/87 (!) 116/54 (!) 137/55  Pulse:  78 72 71  Resp:  18  16  Temp: 98 F (36.7 C) 97.6 F (36.4 C) 98 F (36.7 C) 98.2 F (36.8 C)  TempSrc: Oral Oral Oral   SpO2:  100% 99% 100%  Weight:  47.5 kg    Height:  5\' 5"  (1.651 m)      Intake/Output Summary (Last 24 hours) at 11/06/2021 1044 Last data filed at 11/06/2021 0900 Gross per 24 hour  Intake 937.3 ml  Output --  Net 937.3 ml   Filed Weights   11/05/21 2301  Weight: 47.5 kg    Examination:  General exam: Appears calm and comfortable.  On room air.  Elderly female lying in bed. Respiratory system: Bilateral decreased breath sounds at bases Cardiovascular system: S1 & S2 heard, Rate controlled Gastrointestinal system: Abdomen is nondistended, soft and mildly tender in the lower quadrant.  Normal bowel sounds heard. Extremities: No cyanosis, clubbing, edema  Central nervous system: Alert and oriented. No focal neurological deficits. Moving extremities Skin: No rashes, lesions or ulcers Psychiatry: Judgement and insight appear normal. Mood & affect appropriate.     Data Reviewed: I have personally reviewed following labs and imaging studies  CBC: Recent Labs  Lab 11/05/21 1629 11/06/21 0452  WBC 7.9 6.3  HGB 9.8* 8.1*  HCT 30.0* 25.2*  MCV 95.8 96.9  PLT 230 [id number]   Basic Metabolic Panel: Recent Labs  Lab 11/05/21 1629 11/06/21 0452  NA 139  142  K 4.2 3.7  CL 112* 117*  CO2 19* 20*  GLUCOSE 104* 95  BUN 41* 33*  CREATININE 1.03* 0.94  CALCIUM 9.4 8.3*   GFR: Estimated Creatinine Clearance: 26.8 mL/min (by C-G formula based on SCr of 0.94 mg/dL). Liver Function Tests: Recent Labs  Lab 11/05/21 1629  AST 42*  ALT 28  ALKPHOS 87  BILITOT 0.6  PROT 6.8  ALBUMIN 3.5   Recent Labs  Lab 11/05/21 1629   LIPASE 77*   No results for input(s): AMMONIA in the last 168 hours. Coagulation Profile: No results for input(s): INR, PROTIME in the last 168 hours. Cardiac Enzymes: No results for input(s): CKTOTAL, CKMB, CKMBINDEX, TROPONINI in the last 168 hours. BNP (last 3 results) No results for input(s): PROBNP in the last 8760 hours. HbA1C: No results for input(s): HGBA1C in the last 72 hours. CBG: No results for input(s): GLUCAP in the last 168 hours. Lipid Profile: No results for input(s): CHOL, HDL, LDLCALC, TRIG, CHOLHDL, LDLDIRECT in the last 72 hours. Thyroid Function Tests: No results for input(s): TSH, T4TOTAL, FREET4, T3FREE, THYROIDAB in the last 72 hours. Anemia Panel: No results for input(s): VITAMINB12, FOLATE, FERRITIN, TIBC, IRON, RETICCTPCT in the last 72 hours. Sepsis Labs: Recent Labs  Lab 11/05/21 2146 11/05/21 2350  LATICACIDVEN 2.9* 1.8    Recent Results (from the past 240 hour(s))  Blood culture (routine x 2)     Status: None (Preliminary result)   Collection Time: 11/05/21  9:51 PM   Specimen: BLOOD  Result Value Ref Range Status   Specimen Description   Final    BLOOD BLOOD RIGHT FOREARM Performed at [facility], [place] [address], [place], [place] [address]    Special Requests   Final    BOTTLES DRAWN AEROBIC AND ANAEROBIC Blood Culture results may not be optimal due to an inadequate volume of blood received in culture bottles Performed at [facility] [address] [place], [place] [address]    Culture   Final    NO GROWTH < 12 HOURS Performed at [facility] [address], [place], [place] [address]    Report Status PENDING  Incomplete  Blood culture (routine x 2)     Status: None (Preliminary result)   Collection Time: 11/05/21 11:50 PM   Specimen: BLOOD  Result Value Ref Range Status   Specimen Description   Final    BLOOD RIGHT ANTECUBITAL Performed at [facility]  [address] [place], [place] [address]    Special Requests   Final    BOTTLES DRAWN AEROBIC ONLY Blood Culture adequate volume Performed at [facility] [address], [place], [place] [address]    Culture   Final    NO GROWTH < 12 HOURS Performed at [facility] [address], [place], [place] [address]    Report Status PENDING  Incomplete         Radiology Studies: CT Head Wo Contrast  Result Date: 11/05/2021 CLINICAL DATA:  Unwitnessed fall. EXAM: CT HEAD WITHOUT CONTRAST TECHNIQUE: Contiguous axial images were obtained from the base of the skull through the vertex without intravenous contrast. RADIATION DOSE REDUCTION: This exam was performed according to the departmental dose-optimization program which includes automated exposure control, adjustment of the mA and/or kV according to patient size and/or use of iterative reconstruction technique. COMPARISON:  MRI February 07, 2017 FINDINGS: Brain: No evidence of acute infarction, hemorrhage, hydrocephalus, extra-axial  collection or mass lesion/mass effect. Mild age-related global parenchymal volume loss. Similar asymmetry of the lateral ventricles. Mild burden of chronic ischemic white matter disease. Vascular: No hyperdense vessel. Atherosclerosis of the internal carotid and vertebral arteries. Skull: Normal. Negative for fracture or focal lesion. Sinuses/Orbits: Visualized portions of the paranasal sinuses are predominantly clear. Prior lens surgery. Other: Mastoid air cells are predominantly clear. Probable dense cerumen in the bilateral external auditory canals. IMPRESSION: No acute intracranial abnormality. Electronically Signed   By: Dahlia Bailiff M.D.   On: 11/05/2021 16:30   CT Cervical Spine Wo Contrast  Result Date: 11/05/2021 CLINICAL DATA:  Neck trauma. Fell. Contemporaneous head CT negative for acute abnormality. EXAM: CT CERVICAL SPINE WITHOUT CONTRAST TECHNIQUE: Multidetector CT imaging of the  cervical spine was performed without intravenous contrast. Multiplanar CT image reconstructions were also generated. RADIATION DOSE REDUCTION: This exam was performed according to the departmental dose-optimization program which includes automated exposure control, adjustment of the mA and/or kV according to patient size and/or use of iterative reconstruction technique. COMPARISON:  None Available. FINDINGS: Alignment: There is a minimal 1-2 mm anterolisthesis at C2-3, C3-4, C4-5 and C7-T1, 2 mm anterolisthesis T1-2, all most likely degenerative etiology. No traumatic listhesis is suspected. There is narrowing and osteophyte formation at the anterior atlantodental joint. Skull base and vertebrae: Osteopenia. The skull base, mastoid air cells and middle ears are unremarkable. There is mild acute upper plate anterior wedge compression fracture of the T1 vertebral body with compaction of trabecular elements in the superior vertebral body, slight posterosuperior cortical retropulsion, and nondisplaced fragmentation along the anterior superior vertebral body. Loss of anterior height at T1 is no more than 20%, posterior height loss no more than 10%. No cervical fracture is evident and no primary bone lesion is suspected. Soft tissues and spinal canal: There is mild soft tissue reaction in the anterior prevertebral plane alongside T1 but no space-occupying hematoma. There is no further prevertebral swelling or fluid. No visible canal hematoma. There are moderate calcifications in both carotid bulbs. Disc levels: No herniated discs or cord compromise are noted at any level including T1. There is multilevel facet joint and uncinate spurring. There is partial disc space loss C3-4, C5-6 and C6-7 and slight disc narrowing C4-5. The other discs are normal in heights. There are small posterior osteophytes at C3-4 without stenosis, small bidirectional osteophytes at C5-6 and C6-7 also without stenosis. No cervical levels  demonstrate critical foraminal stenosis. There is mild foraminal stenosis on the right at C5-6. Upper chest: Lung apices demonstrate interstitial reticulation which is most likely due to chronic interstitial disease, less likely could indicate edema in the proper clinical setting. Other: There is asymmetric joint space loss and spurring of the left TMJ. Incidental note is made of bilateral mandibular tori. There is advanced carious decay of the right maxillary 6 year molar. Follow-up with a dentist is recommended. IMPRESSION: 1. Acute mild upper plate anterior wedge compression fracture T1 vertebral body. 2. Slight posterosuperior cortical retropulsion with 20% anterior and 10% posterior height loss, with only mild prevertebral soft tissue reaction and no intracanalicular or paraspinal hematoma. 3. Osteopenia and degenerative change without evidence of cervical fractures. 4. Interstitial reticulation in the lung apices which is most likely chronic, less likely could be interstitial edema. 5. Carotid atherosclerosis. 6. Severe decay of the right maxillary 6 year molar. Follow-up with a dentist is recommended. 7. Asymmetric left TMJ DJD. 8. Discussed over the phone with Dr. Wyvonnia Dusky at 9:04 p.m., 11/05/2021 with acknowledgement  of findings. Electronically Signed   By: Telford Nab M.D.   On: 11/05/2021 21:05   CT ABDOMEN PELVIS W CONTRAST  Result Date: 11/05/2021 CLINICAL DATA:  Acute abdominal pain EXAM: CT ABDOMEN AND PELVIS WITH CONTRAST TECHNIQUE: Multidetector CT imaging of the abdomen and pelvis was performed using the standard protocol following bolus administration of intravenous contrast. RADIATION DOSE REDUCTION: This exam was performed according to the departmental dose-optimization program which includes automated exposure control, adjustment of the mA and/or kV according to patient size and/or use of iterative reconstruction technique. CONTRAST:  68mL OMNIPAQUE IOHEXOL 300 MG/ML  SOLN COMPARISON:   12/08/2007 FINDINGS: Motion degraded images. Lower chest: Lung bases are clear. Hepatobiliary: 11 mm cyst in the central right liver (series 11/image 5). Calcified hepatic granuloma. Liver is otherwise within normal limits. Gallbladder is unremarkable. No intrahepatic or extrahepatic ductal dilatation. Pancreas: Within normal limits. Spleen: Calcified splenic granuloma. Adrenals/Urinary Tract: Adrenal glands are within normal limits. Bilateral renal cysts, measuring up to 4.7 cm in the left upper kidney (series 11/image 11). No hydronephrosis. Bladder is within normal limits. Stomach/Bowel: Stomach is notable for a moderate hiatal hernia, incompletely visualized. No evidence of bowel obstruction. Appendix is poorly visualized but grossly unremarkable (series 2/image 52). No colonic wall thickening or inflammatory changes. Vascular/Lymphatic: No evidence of abdominal aortic aneurysm. Atherosclerotic calcifications of the abdominal aorta and branch vessels. No suspicious abdominopelvic lymphadenopathy. Reproductive: Status post hysterectomy. No adnexal masses. Other: No abdominopelvic ascites. Musculoskeletal: Mild superior endplate compression fracture deformity at T12 and L3, age indeterminate but likely chronic. No retropulsion. IMPRESSION: No CT findings to account for the patient's acute abdominal pain. Additional ancillary findings as above. Electronically Signed   By: Julian Hy M.D.   On: 11/05/2021 20:53   CT Angio Abd/Pel W and/or Wo Contrast  Result Date: 11/05/2021 CLINICAL DATA:  Suspected mesenteric ischemia. EXAM: CTA ABDOMEN AND PELVIS WITHOUT AND WITH CONTRAST TECHNIQUE: Multidetector CT imaging of the abdomen and pelvis was performed using the standard protocol during bolus administration of intravenous contrast. Multiplanar reconstructed images and MIPs were obtained and reviewed to evaluate the vascular anatomy. RADIATION DOSE REDUCTION: This exam was performed according to the  departmental dose-optimization program which includes automated exposure control, adjustment of the mA and/or kV according to patient size and/or use of iterative reconstruction technique. CONTRAST:  73mL OMNIPAQUE IOHEXOL 300 MG/ML  SOLN COMPARISON:  CT abdomen and pelvis with IV contrast earlier today, CT abdomen and pelvis with IV and oral contrast 12/08/2007. FINDINGS: VASCULAR Aorta: Abdominal aorta is tortuous with heavy mixed plaque. Some of the soft plaque shows mild ulcerative features but there is no penetrating ulcer, aneurysm, dissection or critical stenosis. Celiac: There are ostial calcifications causing a 50% calcific origin stenosis of this vessel. The remainder of the vessel and its branch arteries are patent. SMA: There are minimal scattered mixed plaques but no flow-limiting stenosis, aneurysm or dissection. Renals: There are bilateral single renal arteries. There are calcifications in the proximal right renal artery causing up to 60% stenosis in the proximal 1 cm of the vessel. There are nonstenosing ostial calcifications in the left renal artery origin. Both renal arteries are otherwise clear without aneurysm or dissection. IMA: There is a high-grade mixed plaque origin stenosis of this vessel. The IMA otherwise shows normal opacification. Inflow: There patchy moderate calcifications in the common iliac and internal iliac arteries. There is no more than 30% stenosis in the left common iliac artery proximally, no significant narrowing of the right common  iliac. There is no flow-limiting stenosis despite patchy calcifications in either internal iliac artery. There is mild scattered nonstenosing plaque in the internal iliac arteries. Proximal Outflow: There nonstenosing calcifications in both common iliac arteries. The visualized deep and circumflex femoral arteries are patent. Proximal left superficial femoral artery is patent. There is a focal stenosis in the proximal right superficial femoral  artery up to 50% due to vessel thrombus, otherwise the visualized right SFA is patent. Veins: Patent portal vein, IVC and deep pelvic veins. Review of the MIP images confirms the above findings. NON-VASCULAR Lower chest: There are COPD and scarring changes in the lung bases, elevated right hemidiaphragm large hiatal hernia stomach majority intrathoracic. There are coronary artery calcifications mild cardiomegaly. Hepatobiliary: 1.3 cm cyst in the dome of the right lobe of the liver is noted, previously 1 cm in 2009. There is a calcified granuloma in the anterior segment of the right lobe. The liver slightly steatotic and otherwise unremarkable. Pancreas: No focal abnormality. Spleen: Single calcified granuloma. No mass enhancement. No splenomegaly. Adrenals/Urinary Tract: No adrenal mass. No renal mass enhancement with multiple bilateral cysts described previously. There is no hydronephrosis. No bladder thickening. Dense contrast opacifies the bladder from today's earlier abdomen pelvis CT. Stomach/Bowel: No wall thickening or dilatation including the appendix. There are colonic diverticula without inflammatory change. Lymphatic: No adenopathy.  Pelvic phleboliths. Reproductive: Status post hysterectomy.  No adnexal mass. Other: Small umbilical fat hernia.  No free air, hemorrhage or fluid Musculoskeletal: Osteopenia. Mild-to-moderate chronic compression fracture T12 vertebral body is unchanged since 2009. Mild generalized upper plate compression fracture of L3 was not seen in 2009 or the lumbar MRI from 12/03/2017, age indeterminate but no adjacent hematoma. There is S shaped lower thoracic/lumbar scoliosis, degenerative changes. IMPRESSION: VASCULAR 1. Aortic and branch vessel atherosclerosis described above. No dissection or aneurysm. Some of the aortic soft plaque shows mild ulcerative features but there is no penetrating ulcer. 2. There is a high-grade IMA origin stenosis, but there is no flow-limiting stenosis  in the SMA and the remainder of the IMA opacifies normally. 3. There is a 50% calcific origin stenosis in the celiac artery, which is otherwise clear, and a 60% calcific stenosis in the proximal right renal artery, with the renal arteries otherwise without stenosis. 4. 50% focal soft plaque stenosis in the proximal right superficial femoral artery. No other appreciable significant inflow or proximal outflow stenosis with calcific changes described above. 5. Coronary artery atherosclerosis.  Mild cardiomegaly. NON-VASCULAR 1. Age indeterminate mild upper plate generalized compression fracture deformity of L3 vertebral body, is probably subacute given the lack of adjacent soft tissue reaction, most recent lumbar imaging is MRI 12/03/2017 and this was not present at that time. 2. No acute abdominal or pelvic findings. 3. Large hiatal hernia and additional chronic findings discussed above. Electronically Signed   By: Telford Nab M.D.   On: 11/05/2021 23:06        Scheduled Meds:  FLUoxetine  20 mg Oral Daily   pantoprazole (PROTONIX) IV  40 mg Intravenous Q12H   zolpidem  5 mg Oral QHS   Continuous Infusions:  sodium chloride 75 mL/hr at 11/06/21 1030   cefTRIAXone (ROCEPHIN)  IV            Aline August, MD [facility] 11/06/2021, 10:44 AM

## 2021-11-06 NOTE — H&P (Addendum)
History and Physical    Patient: Evelyn [name] [id number] DOB: 1924-09-11 DOA: 11/05/2021 DOS: the patient was seen and examined on 11/06/2021 PCP: Wardell Honour, MD  Patient coming from: Home  Chief Complaint:  Chief Complaint  Patient presents with   Abdominal Pain   HPI: CHAILLE [name] is a 88 y.o. female with medical history significant of cognitive impairment, CKD stage IIIa, hyperlipidemia who presents for concerns of a fall, diarrhea and general malaise.  Patient lives alone and normally is very independent.  She normally can ambulate without assistance but yesterday she she believes she was getting out of bed and walking into the living room and found herself on the floor.  She was unclear as to how she fell but she was able to get up and go back to bed.  This morning she awoke and had general malaise and severe bilateral lower abdominal pain prompting her to present to the ED.  She also has been having diarrhea for the past 4 days.  No new medication changes or antibiotics.  Denies any hematochezia or melena.  No nausea or vomiting.  No fever.  In the ED, she was afebrile mildly hypertensive with SBP of 170.  UA was positive for large leukocyte and nitrite with many bacteria. No leukocytosis but hemoglobin has notably downward trended to 9.8 from 13.5 just 4 months ago.  She denies any NSAID use.  However she does have history of gastritis with ulcer in the past.  CT abdomen/pelvis was initially obtained with no acute findings.  Due to severe pain while in the ED she further underwent a CTA angio of the abdomen and pelvis which showed no acute abdominal or pelvic findings.  CT head was negative for acute findings.  CT cervical spine showed acute compression fracture of the T1 vertebral body.  ED physician discussed with neurosurgery and no intervention or brace was advised. Recommend conservative treatment.  Review of Systems: As mentioned in the history of present illness. All  other systems reviewed and are negative. Past Medical History:  Diagnosis Date   Allergy    Arthritis    Chronic cystitis    Congenital blindness    right eye   Depression    H/O: hematuria    Hepatitis, unspecified    statin related, resolved quickly   Hyperlipidemia    Legally blind in right eye, as defined in [place]    Syncope and collapse 04/01/2014   UTI (lower urinary tract infection)    HISTORY OF UTI   Past Surgical History:  Procedure Laterality Date   ABDOMINAL HYSTERECTOMY     vaginal   BLADDER SUSPENSION     A-P   CATARACT EXTRACTION W/ INTRAOCULAR LENS IMPLANT Left    OPEN REDUCTION INTERNAL FIXATION (ORIF) DISTAL RADIAL FRACTURE Right 03/09/2016   Procedure: OPEN REDUCTION INTERNAL FIXATION (ORIF) DISTAL RADIAL FRACTURE;  Surgeon: Leanora Cover, MD;  Location: [facility];  Service: Orthopedics;  Laterality: Right;   Social History:  reports that she has never smoked. She has never used smokeless tobacco. She reports that she does not drink alcohol and does not use drugs.  No Known Allergies  Family History  Problem Relation Age of Onset   Cancer Neg Hx        Colon cancer   Stroke Neg Hx    Heart disease Neg Hx    Hyperlipidemia Neg Hx    Hypertension Neg Hx    Kidney disease Neg Hx  Colon polyps Neg Hx    Colon cancer Neg Hx     Prior to Admission medications   Medication Sig Start Date End Date Taking? Authorizing Provider  cholecalciferol (VITAMIN D3) 25 MCG (1000 UNIT) tablet Take 1,000 Units by mouth daily.   Yes [provider]  FLUoxetine (PROZAC) 20 MG capsule Take 20 mg by mouth daily.   Yes [provider]  polyethylene glycol (MIRALAX / GLYCOLAX) 17 g packet Take 17 g by mouth as needed for moderate constipation.   Yes [provider]  zolpidem (AMBIEN) 10 MG tablet Take 1/2 tablet by mouth once daily at bedtime as needed for sleep. Patient taking differently: Take 5 mg by mouth at bedtime. 07/01/21  Yes  Wardell Honour, MD    Physical Exam: Vitals:   11/05/21 2100 11/05/21 2200 11/05/21 2204 11/05/21 2301  BP: (!) 145/52 (!) 157/60  (!) 148/87  Pulse: 99 80  78  Resp: 18   18  Temp:   98 F (36.7 C) 97.6 F (36.4 C)  TempSrc:   Oral Oral  SpO2: 100% 100%  100%  Weight:    47.5 kg  Height:    5\' 5"  (1.651 m)   Constitutional: NAD, calm, comfortable, well-appearing elderly female appearing younger than stated age laying flat in bed Eyes:  lids and conjunctivae normal ENMT: Mucous membranes are moist.  Neck: normal, supple Respiratory: clear to auscultation bilaterally, no wheezing, no crackles. Normal respiratory effort. No accessory muscle use.  Cardiovascular: Regular rate and rhythm, no murmurs / rubs / gallops. No extremity edema.  Abdomen: Mild abdominal tenderness to left and right lower lateral abdomen with no rebound tenderness, guarding or rigidity.  Bowel sounds positive.  Musculoskeletal: no clubbing / cyanosis. No joint deformity upper and lower extremities. Good ROM, no contractures. Normal muscle tone.  Skin: no rashes, lesions, ulcers. No induration Neurologic: CN 2-12 grossly intact. Strength 5/5 in all 4.  Psychiatric: Normal judgment and insight. Alert and oriented x 3 to self, place and current event but not time, normal mood. Data Reviewed:  See HPI  Assessment and Plan: UTI (urinary tract infection) UA is grossly positive.  Continue IV Rocephin pending urine culture.  Acute anemia Noted to have downward trending hemoglobin from 13.5 a few months ago to 9.8 on presentation.  Has had a few days of diarrhea but denies any hematochezia or melena.  However she has history of gastric ulcer with last EGD in 12/2017 with resolution of her ulcer with Dr. Ardis Hughs.  Last routine colonoscopy in 2006 with nonbleeding internal hemorrhoids and otherwise normal colon.  Denies any NSAID use. - CTA abdomen and pelvis for any acute findings.  Abdominal pain is in bilateral  lower quadrants.  No epigastric pain.  Possibly could be a small bowel bleed.  Needs GI consultation in the morning and will keep n.p.o.  Cognitive impairment Alert and oriented to self, place and current event but not time  Compression fracture of body of thoracic vertebra (HCC) Acute T1 compression fracture Subacute L3 mild compression fracture Likely from baseline osteopenia -Neurosurgery was consulted by ED physician and recommends conservative management.  No intervention or bracing at this time.  Patient reports chronic back pain that is no worse than baseline at this time.      Advance Care Planning:   Code Status: DNR reports this being stated in her living will at home  Consults: needs GI consult in the morning  Family Communication: No family at  bedside. Wants daughter updated in the morning rather than overnight.  Severity of Illness: The appropriate patient status for this patient is INPATIENT. Inpatient status is judged to be reasonable and necessary in order to provide the required intensity of service to ensure the patient's safety. The patient's presenting symptoms, physical exam findings, and initial radiographic and laboratory data in the context of their chronic comorbidities is felt to place them at high risk for further clinical deterioration. Furthermore, it is not anticipated that the patient will be medically stable for discharge from the hospital within 2 midnights of admission.   * I certify that at the point of admission it is my clinical judgment that the patient will require inpatient hospital care spanning beyond 2 midnights from the point of admission due to high intensity of service, high risk for further deterioration and high frequency of surveillance required.*  Author: Orene Desanctis, DO 11/06/2021 12:57 AM  For on call review [email or web address].

## 2021-11-06 NOTE — Consult Note (Addendum)
Referring Provider: Dr. Hanley BenAlekh, [facility] Primary Care Physician:  Frederica KusterMiller, Stephen M, MD Primary Gastroenterologist:  Dr. Christella HartiganJacobs  Reason for Consultation:  GI bleed  HPI: Evelyn EdenJoan S [name] is a 88 y.o. female with medical history significant of cognitive impairment, CKD stage IIIa, hyperlipidemia who presents for concerns of a fall, diarrhea and general malaise.   Patient lives alone and normally is very independent.  She normally can ambulate without assistance but on Wednesday she she believes she was getting out of bed and walking into the living room and found herself on the floor.  She was unclear as to how she fell but she was able to get up and go back to bed.  Thursday morning she awoke and had general malaise and severe bilateral lower abdominal pain prompting her to present to the ED.  She reports that she had had some constipation very early in the week.  This was then followed by some very dark black stools.  So far no reports of stools concerning for ongoing GI bleeding since her hospitalization.  Her hemoglobin had notably downward trended to 9.8 grams from 13.5 grams just 4 months ago.  Is heme positive.  BUN up slightly.  Hgb down slight more at 8.1 grams this AM.  She has not yet been started on PPI.  Was not taking her PPI at home as was previously recommended.  EGD in 2019 showed a medium size hiatal hernia and a clean-based antral ulcer.  Repeat EGD a few months later in 2019 showed that the ulcer had resolved.  Last routine colonoscopy in 2006 with nonbleeding internal hemorrhoids and otherwise normal colon.     Past Medical History:  Diagnosis Date   Allergy    Arthritis    Chronic cystitis    Congenital blindness    right eye   Depression    H/O: hematuria    Hepatitis, unspecified    statin related, resolved quickly   Hyperlipidemia    Legally blind in right eye, as [place] in [place]    Syncope and collapse 04/01/2014   UTI (lower urinary tract infection)    HISTORY  OF UTI    Past Surgical History:  Procedure Laterality Date   ABDOMINAL HYSTERECTOMY     vaginal   BLADDER SUSPENSION     A-P   CATARACT EXTRACTION W/ INTRAOCULAR LENS IMPLANT Left    OPEN REDUCTION INTERNAL FIXATION (ORIF) DISTAL RADIAL FRACTURE Right 03/09/2016   Procedure: OPEN REDUCTION INTERNAL FIXATION (ORIF) DISTAL RADIAL FRACTURE;  Surgeon: Betha LoaKevin Kuzma, MD;  Location: [facility];  Service: Orthopedics;  Laterality: Right;    Prior to Admission medications   Medication Sig Start Date End Date Taking? Authorizing Provider  cholecalciferol (VITAMIN D3) 25 MCG (1000 UNIT) tablet Take 1,000 Units by mouth daily.   Yes [provider]  FLUoxetine (PROZAC) 20 MG capsule Take 20 mg by mouth daily.   Yes [provider]  polyethylene glycol (MIRALAX / GLYCOLAX) 17 g packet Take 17 g by mouth as needed for moderate constipation.   Yes [provider]  zolpidem (AMBIEN) 10 MG tablet Take 1/2 tablet by mouth once daily at bedtime as needed for sleep. Patient taking differently: Take 5 mg by mouth at bedtime. 07/01/21  Yes Frederica KusterMiller, Stephen M, MD    Current Facility-Administered Medications  Medication Dose Route Frequency Provider Last Rate Last Admin   acetaminophen (TYLENOL) tablet 650 mg  650 mg Oral Q6H PRN Tu, Ching T, DO  cefTRIAXone (ROCEPHIN) 1 g in sodium chloride 0.9 % 100 mL IVPB  1 g Intravenous Q24H Tu, Ching T, DO       FLUoxetine (PROZAC) capsule 20 mg  20 mg Oral Daily Tu, Ching T, DO       morphine (PF) 2 MG/ML injection 1 mg  1 mg Intravenous Q3H PRN Tu, Ching T, DO       zolpidem (AMBIEN) tablet 5 mg  5 mg Oral QHS Tu, Ching T, DO   5 mg at 11/06/21 0100    Allergies as of 11/05/2021   (No Known Allergies)    Family History  Problem Relation Age of Onset   Cancer Neg Hx        Colon cancer   Stroke Neg Hx    Heart disease Neg Hx    Hyperlipidemia Neg Hx    Hypertension Neg Hx    Kidney disease Neg Hx    Colon  polyps Neg Hx    Colon cancer Neg Hx     Social History   Socioeconomic History   Marital status: Widowed    Spouse name: Not on file   Number of children: Not on file   Years of education: Not on file   Highest education level: Not on file  Occupational History   Occupation: Retired  Tobacco Use   Smoking status: Never   Smokeless tobacco: Never  Vaping Use   Vaping Use: Never used  Substance and Sexual Activity   Alcohol use: No   Drug use: No   Sexual activity: Not Currently  Other Topics Concern   Not on file  Social History Narrative   Per [place] Patient Packet Abstracted on 12/09/2020:      Diet: Normal       Caffeine: Tea      Married, if yes what year: Widowed, married in [place] you live in a house, apartment, assisted living, condo, trailer, ect: House      Is it one or more stories: More       How many persons live in your home? 1      Pets: No      Highest level or education completed: BA      Current/Past profession: Teacher      Exercise:       Left blank            Type and how often: Left Blank          Living Will: Yes   DNR: Yes   POA/HPOA: Yes      Functional Status:   Do you have difficulty bathing or dressing yourself? No   Do you have difficulty preparing food or eating? No   Do you have difficulty managing your medications? No   Do you have difficulty managing your finances? No   Do you have difficulty affording your medications? No   Social Determinants of Radio broadcast assistant Strain: Not on file  Food Insecurity: Not on file  Transportation Needs: Not on file  Physical Activity: Not on file  Stress: Not on file  Social Connections: Not on file  Intimate Partner Violence: Not on file    Review of Systems: ROS is O/W negative except as mentioned in HPI.  Physical Exam: Vital signs in last 24 hours: Temp:  [97.6 F (36.4 C)-98.5 F (36.9 C)] 98.2 F (36.8 C) (05/19 0733) Pulse Rate:  [65-99] 71 (05/19  0733)  Resp:  [15-18] 16 (05/19 0733) BP: (116-160)/(52-87) 137/55 (05/19 0733) SpO2:  [99 %-100 %] 100 % (05/19 0733) Weight:  [47.5 kg] 47.5 kg (05/18 2301) Last BM Date : 11/05/21 General:  Alert, Well-developed, well-nourished, pleasant and cooperative in NAD Head:  Normocephalic and atraumatic. Eyes:  Sclera clear, no icterus.  Conjunctiva pink. Ears:  Normal auditory acuity. Mouth:  No deformity or lesions.   Lungs:  Clear throughout to auscultation.  No wheezes, crackles, or rhonchi.  Heart:  Regular rate and rhythm; no murmurs, clicks, rubs, or gallops. Abdomen:  Soft, non-distended.  BS present.  Minimal lower abdominal/pelvis TTP.   Rectal:  Deferred.  Heme positive in the ED.  Msk:  Symmetrical without gross deformities. Pulses:  Normal pulses noted. Extremities:  Without clubbing or edema. Neurologic:  Alert and oriented x 4;  grossly normal neurologically. Skin:  Intact without significant lesions or rashes. Psych:  Alert and cooperative. Normal mood and affect.  Intake/Output from previous day: 05/18 0701 - 05/19 0700 In: 937.3 [I.V.:937.3] Out: -   Lab Results: Recent Labs    11/05/21 1629 11/06/21 0452  WBC 7.9 6.3  HGB 9.8* 8.1*  HCT 30.0* 25.2*  PLT 230 159   BMET Recent Labs    11/05/21 1629 11/06/21 0452  NA 139 142  K 4.2 3.7  CL 112* 117*  CO2 19* 20*  GLUCOSE 104* 95  BUN 41* 33*  CREATININE 1.03* 0.94  CALCIUM 9.4 8.3*   LFT Recent Labs    11/05/21 1629  PROT 6.8  ALBUMIN 3.5  AST 42*  ALT 28  ALKPHOS 87  BILITOT 0.6   Studies/Results: CT Head Wo Contrast  Result Date: 11/05/2021 CLINICAL DATA:  Unwitnessed fall. EXAM: CT HEAD WITHOUT CONTRAST TECHNIQUE: Contiguous axial images were obtained from the base of the skull through the vertex without intravenous contrast. RADIATION DOSE REDUCTION: This exam was performed according to the departmental dose-optimization program which includes automated exposure control, adjustment of the  mA and/or kV according to patient size and/or use of iterative reconstruction technique. COMPARISON:  MRI February 07, 2017 FINDINGS: Brain: No evidence of acute infarction, hemorrhage, hydrocephalus, extra-axial collection or mass lesion/mass effect. Mild age-related global parenchymal volume loss. Similar asymmetry of the lateral ventricles. Mild burden of chronic ischemic white matter disease. Vascular: No hyperdense vessel. Atherosclerosis of the internal carotid and vertebral arteries. Skull: Normal. Negative for fracture or focal lesion. Sinuses/Orbits: Visualized portions of the paranasal sinuses are predominantly clear. Prior lens surgery. Other: Mastoid air cells are predominantly clear. Probable dense cerumen in the bilateral external auditory canals. IMPRESSION: No acute intracranial abnormality. Electronically Signed   By: Dahlia Bailiff M.D.   On: 11/05/2021 16:30   CT Cervical Spine Wo Contrast  Result Date: 11/05/2021 CLINICAL DATA:  Neck trauma. Fell. Contemporaneous head CT negative for acute abnormality. EXAM: CT CERVICAL SPINE WITHOUT CONTRAST TECHNIQUE: Multidetector CT imaging of the cervical spine was performed without intravenous contrast. Multiplanar CT image reconstructions were also generated. RADIATION DOSE REDUCTION: This exam was performed according to the departmental dose-optimization program which includes automated exposure control, adjustment of the mA and/or kV according to patient size and/or use of iterative reconstruction technique. COMPARISON:  None Available. FINDINGS: Alignment: There is a minimal 1-2 mm anterolisthesis at C2-3, C3-4, C4-5 and C7-T1, 2 mm anterolisthesis T1-2, all most likely degenerative etiology. No traumatic listhesis is suspected. There is narrowing and osteophyte formation at the anterior atlantodental joint. Skull base and vertebrae: Osteopenia. The skull base, mastoid  air cells and middle ears are unremarkable. There is mild acute upper plate  anterior wedge compression fracture of the T1 vertebral body with compaction of trabecular elements in the superior vertebral body, slight posterosuperior cortical retropulsion, and nondisplaced fragmentation along the anterior superior vertebral body. Loss of anterior height at T1 is no more than 20%, posterior height loss no more than 10%. No cervical fracture is evident and no primary bone lesion is suspected. Soft tissues and spinal canal: There is mild soft tissue reaction in the anterior prevertebral plane alongside T1 but no space-occupying hematoma. There is no further prevertebral swelling or fluid. No visible canal hematoma. There are moderate calcifications in both carotid bulbs. Disc levels: No herniated discs or cord compromise are noted at any level including T1. There is multilevel facet joint and uncinate spurring. There is partial disc space loss C3-4, C5-6 and C6-7 and slight disc narrowing C4-5. The other discs are normal in heights. There are small posterior osteophytes at C3-4 without stenosis, small bidirectional osteophytes at C5-6 and C6-7 also without stenosis. No cervical levels demonstrate critical foraminal stenosis. There is mild foraminal stenosis on the right at C5-6. Upper chest: Lung apices demonstrate interstitial reticulation which is most likely due to chronic interstitial disease, less likely could indicate edema in the proper clinical setting. Other: There is asymmetric joint space loss and spurring of the left TMJ. Incidental note is made of bilateral mandibular tori. There is advanced carious decay of the right maxillary 6 year molar. Follow-up with a dentist is recommended. IMPRESSION: 1. Acute mild upper plate anterior wedge compression fracture T1 vertebral body. 2. Slight posterosuperior cortical retropulsion with 20% anterior and 10% posterior height loss, with only mild prevertebral soft tissue reaction and no intracanalicular or paraspinal hematoma. 3. Osteopenia and  degenerative change without evidence of cervical fractures. 4. Interstitial reticulation in the lung apices which is most likely chronic, less likely could be interstitial edema. 5. Carotid atherosclerosis. 6. Severe decay of the right maxillary 6 year molar. Follow-up with a dentist is recommended. 7. Asymmetric left TMJ DJD. 8. Discussed over the phone with Dr. Wyvonnia Dusky at 9:04 p.m., 11/05/2021 with acknowledgement of findings. Electronically Signed   By: Telford Nab M.D.   On: 11/05/2021 21:05   CT ABDOMEN PELVIS W CONTRAST  Result Date: 11/05/2021 CLINICAL DATA:  Acute abdominal pain EXAM: CT ABDOMEN AND PELVIS WITH CONTRAST TECHNIQUE: Multidetector CT imaging of the abdomen and pelvis was performed using the standard protocol following bolus administration of intravenous contrast. RADIATION DOSE REDUCTION: This exam was performed according to the departmental dose-optimization program which includes automated exposure control, adjustment of the mA and/or kV according to patient size and/or use of iterative reconstruction technique. CONTRAST:  3mL OMNIPAQUE IOHEXOL 300 MG/ML  SOLN COMPARISON:  12/08/2007 FINDINGS: Motion degraded images. Lower chest: Lung bases are clear. Hepatobiliary: 11 mm cyst in the central right liver (series 11/image 5). Calcified hepatic granuloma. Liver is otherwise within normal limits. Gallbladder is unremarkable. No intrahepatic or extrahepatic ductal dilatation. Pancreas: Within normal limits. Spleen: Calcified splenic granuloma. Adrenals/Urinary Tract: Adrenal glands are within normal limits. Bilateral renal cysts, measuring up to 4.7 cm in the left upper kidney (series 11/image 11). No hydronephrosis. Bladder is within normal limits. Stomach/Bowel: Stomach is notable for a moderate hiatal hernia, incompletely visualized. No evidence of bowel obstruction. Appendix is poorly visualized but grossly unremarkable (series 2/image 52). No colonic wall thickening or inflammatory  changes. Vascular/Lymphatic: No evidence of abdominal aortic aneurysm. Atherosclerotic calcifications of  the abdominal aorta and branch vessels. No suspicious abdominopelvic lymphadenopathy. Reproductive: Status post hysterectomy. No adnexal masses. Other: No abdominopelvic ascites. Musculoskeletal: Mild superior endplate compression fracture deformity at T12 and L3, age indeterminate but likely chronic. No retropulsion. IMPRESSION: No CT findings to account for the patient's acute abdominal pain. Additional ancillary findings as above. Electronically Signed   By: Julian Hy M.D.   On: 11/05/2021 20:53   CT Angio Abd/Pel W and/or Wo Contrast  Result Date: 11/05/2021 CLINICAL DATA:  Suspected mesenteric ischemia. EXAM: CTA ABDOMEN AND PELVIS WITHOUT AND WITH CONTRAST TECHNIQUE: Multidetector CT imaging of the abdomen and pelvis was performed using the standard protocol during bolus administration of intravenous contrast. Multiplanar reconstructed images and MIPs were obtained and reviewed to evaluate the vascular anatomy. RADIATION DOSE REDUCTION: This exam was performed according to the departmental dose-optimization program which includes automated exposure control, adjustment of the mA and/or kV according to patient size and/or use of iterative reconstruction technique. CONTRAST:  25mL OMNIPAQUE IOHEXOL 300 MG/ML  SOLN COMPARISON:  CT abdomen and pelvis with IV contrast earlier today, CT abdomen and pelvis with IV and oral contrast 12/08/2007. FINDINGS: VASCULAR Aorta: Abdominal aorta is tortuous with heavy mixed plaque. Some of the soft plaque shows mild ulcerative features but there is no penetrating ulcer, aneurysm, dissection or critical stenosis. Celiac: There are ostial calcifications causing a 50% calcific origin stenosis of this vessel. The remainder of the vessel and its branch arteries are patent. SMA: There are minimal scattered mixed plaques but no flow-limiting stenosis, aneurysm or  dissection. Renals: There are bilateral single renal arteries. There are calcifications in the proximal right renal artery causing up to 60% stenosis in the proximal 1 cm of the vessel. There are nonstenosing ostial calcifications in the left renal artery origin. Both renal arteries are otherwise clear without aneurysm or dissection. IMA: There is a high-grade mixed plaque origin stenosis of this vessel. The IMA otherwise shows normal opacification. Inflow: There patchy moderate calcifications in the common iliac and internal iliac arteries. There is no more than 30% stenosis in the left common iliac artery proximally, no significant narrowing of the right common iliac. There is no flow-limiting stenosis despite patchy calcifications in either internal iliac artery. There is mild scattered nonstenosing plaque in the internal iliac arteries. Proximal Outflow: There nonstenosing calcifications in both common iliac arteries. The visualized deep and circumflex femoral arteries are patent. Proximal left superficial femoral artery is patent. There is a focal stenosis in the proximal right superficial femoral artery up to 50% due to vessel thrombus, otherwise the visualized right SFA is patent. Veins: Patent portal vein, IVC and deep pelvic veins. Review of the MIP images confirms the above findings. NON-VASCULAR Lower chest: There are COPD and scarring changes in the lung bases, elevated right hemidiaphragm large hiatal hernia stomach majority intrathoracic. There are coronary artery calcifications mild cardiomegaly. Hepatobiliary: 1.3 cm cyst in the dome of the right lobe of the liver is noted, previously 1 cm in 2009. There is a calcified granuloma in the anterior segment of the right lobe. The liver slightly steatotic and otherwise unremarkable. Pancreas: No focal abnormality. Spleen: Single calcified granuloma. No mass enhancement. No splenomegaly. Adrenals/Urinary Tract: No adrenal mass. No renal mass enhancement  with multiple bilateral cysts described previously. There is no hydronephrosis. No bladder thickening. Dense contrast opacifies the bladder from today's earlier abdomen pelvis CT. Stomach/Bowel: No wall thickening or dilatation including the appendix. There are colonic diverticula without inflammatory change. Lymphatic: No  adenopathy.  Pelvic phleboliths. Reproductive: Status post hysterectomy.  No adnexal mass. Other: Small umbilical fat hernia.  No free air, hemorrhage or fluid Musculoskeletal: Osteopenia. Mild-to-moderate chronic compression fracture T12 vertebral body is unchanged since 2009. Mild generalized upper plate compression fracture of L3 was not seen in 2009 or the lumbar MRI from 12/03/2017, age indeterminate but no adjacent hematoma. There is S shaped lower thoracic/lumbar scoliosis, degenerative changes. IMPRESSION: VASCULAR 1. Aortic and branch vessel atherosclerosis described above. No dissection or aneurysm. Some of the aortic soft plaque shows mild ulcerative features but there is no penetrating ulcer. 2. There is a high-grade IMA origin stenosis, but there is no flow-limiting stenosis in the SMA and the remainder of the IMA opacifies normally. 3. There is a 50% calcific origin stenosis in the celiac artery, which is otherwise clear, and a 60% calcific stenosis in the proximal right renal artery, with the renal arteries otherwise without stenosis. 4. 50% focal soft plaque stenosis in the proximal right superficial femoral artery. No other appreciable significant inflow or proximal outflow stenosis with calcific changes described above. 5. Coronary artery atherosclerosis.  Mild cardiomegaly. NON-VASCULAR 1. Age indeterminate mild upper plate generalized compression fracture deformity of L3 vertebral body, is probably subacute given the lack of adjacent soft tissue reaction, most recent lumbar imaging is MRI 12/03/2017 and this was not present at that time. 2. No acute abdominal or pelvic  findings. 3. Large hiatal hernia and additional chronic findings discussed above. Electronically Signed   By: Telford Nab M.D.   On: 11/05/2021 23:06    IMPRESSION:  *88 year old female presenting after a fall at home.  Had dark, black stools earlier this week, drop in hemoglobin of about 3.5 g, 9.8 grams from 13.5 grams just 4 months ago.  Heme positive, but no further overt GI bleeding noted so far while she is here.  BUN was slightly elevated.  Possibly had a subacute bleed.  Hemoglobin down another 1.5 g this morning, but that may be somewhat dilutional.  She does have a medium size hiatal hernia so could have Cameron's ulcers/lesions/erosions.  Has a history of antral ulcer as well, denies NSAID use.  Was not on PPI at home that was previously recommended and has not been started on any PPI therapy here. *UTI: Started on ceftriaxone.  UTI could account for her complaints of lower abdominal/pelvic pain as there were no other findings on CT scan to account for this.  PLAN: -We will start pantoprazole 40 mg IV twice daily for now.  Will need to continue PO BID upon discharge. -Trend CBC, observe for now.  Patient declined EGD currently, which I think is fine.  Certainly if she seems to begin active bleeding again would need to reconsider. -We will vitamin K 10 mg p.o. x3 days at the request of family members.  **Had a long discussion via phone with the patient's son, Nicole Kindred, and [place] father-in-law who is a Copywriter, advertising in [place].  He had requested vitamin K.  They asked about a follow-up upper GI study, but that could certainly be done in the near future as outpatient when she follows up with Dr. Ardis Hughs if he thinks it is necessary.  Office visit follow-up already scheduled with him on July 12 and put in her discharge information.   Laban Emperor. Zehr  11/06/2021, 9:36 AM       [facility] Attending   I have taken an interval history, reviewed the chart and examined the patient. I  agree  with the Advanced Practitioner's note, impression and recommendations.  Majority the medical decision-making in the formulation of the assessment and plan were performed by me.  Will also order B12, ferritin, INR and folate.  Hold endoscopy in reserve for therapy if needed - it is not at this time.  I also spoke to son and his father-in-law.  Will folllow.  Gatha Mayer, MD, [facility] See Shea Evans on call - gastroenterology for best contact person I am on call tonight and will round Sat and Sun AM [phone] 11/06/2021 3:19 PM

## 2021-11-07 DIAGNOSIS — K921 Melena: Secondary | ICD-10-CM | POA: Diagnosis not present

## 2021-11-07 DIAGNOSIS — R4189 Other symptoms and signs involving cognitive functions and awareness: Secondary | ICD-10-CM | POA: Diagnosis not present

## 2021-11-07 DIAGNOSIS — E872 Acidosis, unspecified: Secondary | ICD-10-CM | POA: Diagnosis not present

## 2021-11-07 DIAGNOSIS — D649 Anemia, unspecified: Secondary | ICD-10-CM | POA: Diagnosis not present

## 2021-11-07 DIAGNOSIS — S22000A Wedge compression fracture of unspecified thoracic vertebra, initial encounter for closed fracture: Secondary | ICD-10-CM | POA: Diagnosis not present

## 2021-11-07 LAB — CBC WITH DIFFERENTIAL/PLATELET
Abs Immature Granulocytes: 0.03 10*3/uL (ref 0.00–0.07)
Basophils Absolute: 0 10*3/uL (ref 0.0–0.1)
Basophils Relative: 1 %
Eosinophils Absolute: 0.2 10*3/uL (ref 0.0–0.5)
Eosinophils Relative: 4 %
HCT: 24.2 % — ABNORMAL LOW (ref 36.0–46.0)
Hemoglobin: 7.9 g/dL — ABNORMAL LOW (ref 12.0–15.0)
Immature Granulocytes: 1 %
Lymphocytes Relative: 20 %
Lymphs Abs: 1.1 10*3/uL (ref 0.7–4.0)
MCH: 31.5 pg (ref 26.0–34.0)
MCHC: 32.6 g/dL (ref 30.0–36.0)
MCV: 96.4 fL (ref 80.0–100.0)
Monocytes Absolute: 0.6 10*3/uL (ref 0.1–1.0)
Monocytes Relative: 10 %
Neutro Abs: 3.8 10*3/uL (ref 1.7–7.7)
Neutrophils Relative %: 64 %
Platelets: 163 10*3/uL (ref 150–400)
RBC: 2.51 MIL/uL — ABNORMAL LOW (ref 3.87–5.11)
RDW: 14.4 % (ref 11.5–15.5)
WBC: 5.7 10*3/uL (ref 4.0–10.5)
nRBC: 0 % (ref 0.0–0.2)

## 2021-11-07 LAB — MAGNESIUM: Magnesium: 2 mg/dL (ref 1.7–2.4)

## 2021-11-07 LAB — COMPREHENSIVE METABOLIC PANEL
ALT: 23 U/L (ref 0–44)
AST: 47 U/L — ABNORMAL HIGH (ref 15–41)
Albumin: 2.7 g/dL — ABNORMAL LOW (ref 3.5–5.0)
Alkaline Phosphatase: 70 U/L (ref 38–126)
Anion gap: 4 — ABNORMAL LOW (ref 5–15)
BUN: 19 mg/dL (ref 8–23)
CO2: 21 mmol/L — ABNORMAL LOW (ref 22–32)
Calcium: 8.7 mg/dL — ABNORMAL LOW (ref 8.9–10.3)
Chloride: 118 mmol/L — ABNORMAL HIGH (ref 98–111)
Creatinine, Ser: 1 mg/dL (ref 0.44–1.00)
GFR, Estimated: 52 mL/min — ABNORMAL LOW (ref >60)
Glucose, Bld: 103 mg/dL — ABNORMAL HIGH (ref 70–99)
Potassium: 4.1 mmol/L (ref 3.5–5.1)
Sodium: 143 mmol/L (ref 135–145)
Total Bilirubin: 0.6 mg/dL (ref 0.3–1.2)
Total Protein: 5.5 g/dL — ABNORMAL LOW (ref 6.5–8.1)

## 2021-11-07 LAB — PROTIME-INR
INR: 1.1 (ref 0.8–1.2)
Prothrombin Time: 13.9 seconds (ref 11.4–15.2)

## 2021-11-07 LAB — VITAMIN B12: Vitamin B-12: 3498 pg/mL — ABNORMAL HIGH (ref 180–914)

## 2021-11-07 LAB — FOLATE: Folate: 19.9 ng/mL (ref >5.9)

## 2021-11-07 LAB — FERRITIN: Ferritin: 28 ng/mL (ref 11–307)

## 2021-11-07 MED ORDER — FOLIC ACID 1 MG PO TABS
1.0000 mg | ORAL_TABLET | Freq: Every day | ORAL | Status: DC
  Administered 2021-11-07 – 2021-11-09 (×3): 1 mg via ORAL
  Filled 2021-11-07 (×3): qty 1

## 2021-11-07 MED ORDER — SODIUM CHLORIDE 0.9 % IV SOLN
1000.0000 mg | Freq: Once | INTRAVENOUS | Status: AC
  Administered 2021-11-07: 1000 mg via INTRAVENOUS
  Filled 2021-11-07: qty 20

## 2021-11-07 MED ORDER — SODIUM CHLORIDE 0.9 % IV SOLN
25.0000 mg | Freq: Once | INTRAVENOUS | Status: AC
  Administered 2021-11-07: 25 mg via INTRAVENOUS
  Filled 2021-11-07: qty 0.5

## 2021-11-07 NOTE — NC FL2 (Signed)
[place] LEVEL OF CARE SCREENING TOOL     IDENTIFICATION  Patient Name: Evelyn [name] Birthdate: 10/08/1924 Sex: female Admission Date (Current Location): 11/05/2021  [facility] and [place] Number:  Herbalist and Address:  [facility],  [address], [place]      Provider Number: [id number]  Attending Physician Name and Address:  Aline August, MD  Relative Name and Phone Number:  Hipp,Lisa Daughter [phone]  [phone]  Rakia, Lehew   [phone]    Current Level of Care: Hospital Recommended Level of Care: [place] Prior Approval Number:    Date Approved/Denied:   PASRR Number: Pending  Discharge Plan: SNF    Current Diagnoses: Patient Active Problem List   Diagnosis Date Noted   Compression fracture of body of thoracic vertebra ([place]) 11/06/2021   Acute anemia 11/06/2021   Cognitive impairment 11/06/2021   Lactic acidosis 11/06/2021   Black stools    Heme + stool    Peptic ulcer disease    Acute metabolic encephalopathy [id number]   Closed wedge compression fracture of T12 vertebra ([place]) 11/24/2016   Chronic midline low back pain 11/18/2016   Chronic renal disease, stage 3, moderately decreased glomerular filtration rate (GFR) between 30-59 mL/min/1.73 square meter ([place]) 11/18/2016   IBS (irritable bowel syndrome)    UTI (urinary tract infection)    Routine general medical examination at a health care facility 05/03/2013   Chronic idiopathic constipation 03/31/2012   Other screening mammogram 03/24/2011   Depression with somatization 03/06/2010   Insomnia 03/08/2008   Hyperlipidemia with target LDL less than 130 02/21/2007    Orientation RESPIRATION BLADDER Height & Weight     Self, Time, Situation, Place  Normal Incontinent Weight: 104 lb 11.5 oz (47.5 kg) Height:  5\' 5"  (165.1 cm)  BEHAVIORAL SYMPTOMS/MOOD NEUROLOGICAL BOWEL NUTRITION STATUS      Continent Diet (Regular)   AMBULATORY STATUS COMMUNICATION OF NEEDS Skin   Limited Assist Verbally Normal                       Personal Care Assistance Level of Assistance  Bathing, Dressing Bathing Assistance: Limited assistance   Dressing Assistance: Independent     Functional Limitations Info  Sight, Hearing, Speech Sight Info: Adequate Hearing Info: Adequate Speech Info: Adequate    SPECIAL CARE FACTORS FREQUENCY  PT (By licensed PT), OT (By licensed OT)     PT Frequency: Minimum 5x a week OT Frequency: Minimum 5x a week            Contractures Contractures Info: Not present    Additional Factors Info  Code Status, Psychotropic, Allergies Code Status Info: DNR Allergies Info: NKA Psychotropic Info: FLUoxetine (PROZAC) capsule 20 mg         Current Medications (11/07/2021):  This is the current hospital active medication list Current Facility-Administered Medications  Medication Dose Route Frequency Provider Last Rate Last Admin   acetaminophen (TYLENOL) tablet 650 mg  650 mg Oral Q6H PRN Tu, Ching T, DO   650 mg at 11/06/21 1202   cefTRIAXone (ROCEPHIN) 1 g in sodium chloride 0.9 % 100 mL IVPB  1 g Intravenous Q24H Tu, Ching T, DO 200 mL/hr at 11/06/21 2113 1 g at 11/06/21 2113   FLUoxetine (PROZAC) capsule 20 mg  20 mg Oral Daily Tu, Ching T, DO   20 mg at [id number] [id number]   folic acid (FOLVITE) tablet 1 mg  1 mg Oral  Daily Aline August, MD   1 mg at 11/07/21 1407   iron dextran complex (INFED) 1,000 mg in sodium chloride 0.9 % 500 mL IVPB  1,000 mg Intravenous Once Gatha Mayer, MD 104 mL/hr at 11/07/21 1559 1,000 mg at 11/07/21 1559   morphine (PF) 2 MG/ML injection 1 mg  1 mg Intravenous Q3H PRN Tu, Ching T, DO   1 mg at 11/06/21 1602   pantoprazole (PROTONIX) injection 40 mg  40 mg Intravenous Q12H Zehr, Janett Billow D, PA-C   40 mg at 11/07/21 0920   phytonadione (VITAMIN K) tablet 10 mg  10 mg Oral Daily Zehr, Laban Emperor, PA-C   10 mg at 11/07/21 0920   zolpidem (AMBIEN) tablet 5  mg  5 mg Oral QHS Tu, Ching T, DO   5 mg at 11/06/21 2116     Discharge Medications: Please see discharge summary for a list of discharge medications.  Relevant Imaging Results:  Relevant Lab Results:   Additional Information SSN [id number]  Ross Ludwig, LCSW

## 2021-11-07 NOTE — Evaluation (Signed)
Physical Therapy Evaluation Patient Details Name: TOMOKO SANDRA MRN: [id number] DOB: Jan 24, 1925 Today's Date: 11/07/2021  History of Present Illness  Pt admitted from home s/p falls with c/o abd pain and dx with acute anemia (hgb 7.9 this session) and T-1 compression fx (to be treated conservatively at this time and no brace required).  Clinical Impression  Pt admitted as above and presenting with functional mobility limitations 2* generalized weakness, balance deficits and questionable safety awareness.  Pt would benefit from follow up rehab at SNF level to maximize IND and safety prior to return home with limited assist.     Recommendations for follow up therapy are one component of a multi-disciplinary discharge planning process, led by the attending physician.  Recommendations may be updated based on patient status, additional functional criteria and insurance authorization.  Follow Up Recommendations Skilled nursing-short term rehab (<3 hours/day)    Assistance Recommended at Discharge Frequent or constant Supervision/Assistance  Patient can return home with the following  A little help with walking and/or transfers;A little help with bathing/dressing/bathroom;Assistance with cooking/housework;Assist for transportation;Help with stairs or ramp for entrance    Equipment Recommendations Rolling walker (2 wheels) ([employer] RW if pt were to return home at [employer])  Recommendations for Other Services       Functional Status Assessment Patient has had a recent decline in their functional status and demonstrates the ability to make significant improvements in function in a reasonable and predictable amount of time.     Precautions / Restrictions Precautions Precautions: Fall Restrictions Weight Bearing Restrictions: No      Mobility  Bed Mobility               General bed mobility comments: NT - pt up in recliner and requests back to same    Transfers Overall transfer level:  Needs assistance Equipment used: None Transfers: Sit to/from Stand Sit to Stand: Min assist           General transfer comment: cues for use of UEs to self assist.  Physical assist to bring wt up and fwd and to balance in standing    Ambulation/Gait Ambulation/Gait assistance: Min assist Gait Distance (Feet): 85 Feet (twice) Assistive device: Rolling walker (2 wheels), 1 person hand held assist Gait Pattern/deviations: Step-to pattern, Step-through pattern, Decreased step length - right, Decreased step length - left, Steppage, Trunk flexed       General Gait Details: Pt ambulated 30' with HHA x1 and with noted instability and risk of falling.  Pt ambulated 30' and 14' with RW, min assist for RW management and balance and with noted improvement in stability  Stairs            Wheelchair Mobility    Modified Rankin (Stroke Patients Only)       Balance Overall balance assessment: Needs assistance Sitting-balance support: No upper extremity supported, Feet supported Sitting balance-Leahy Scale: Good     Standing balance support: No upper extremity supported Standing balance-Leahy Scale: Poor                               Pertinent Vitals/Pain Pain Assessment Pain Assessment: Faces Faces Pain Scale: Hurts little more Pain Location: back pain Pain Descriptors / Indicators: Aching, Sore Pain Intervention(s): Limited activity within patient's tolerance, Monitored during session    Home Living Family/patient expects to be discharged to:: Skilled nursing facility Living Arrangements: Alone  Prior Function Prior Level of Function : Independent/Modified Independent             Mobility Comments: Pt lived alone, had RW but did not use and reports multiple falls       Hand Dominance   Dominant Hand: Right    Extremity/Trunk Assessment   Upper Extremity Assessment Upper Extremity Assessment: Generalized weakness     Lower Extremity Assessment Lower Extremity Assessment: Generalized weakness    Cervical / Trunk Assessment Cervical / Trunk Assessment: Kyphotic  Communication   Communication: No difficulties  Cognition Arousal/Alertness: Awake/alert Behavior During Therapy: WFL for tasks assessed/performed, Impulsive Overall Cognitive Status: Within Functional Limits for tasks assessed                                          General Comments      Exercises     Assessment/Plan    PT Assessment Patient needs continued PT services  PT Problem List         PT Treatment Interventions DME instruction;Gait training;Stair training;Functional mobility training;Therapeutic activities;Therapeutic exercise;Balance training;Patient/family education    PT Goals (Current goals can be found in the Care Plan section)  Acute Rehab PT Goals Patient Stated Goal: REgain IND PT Goal Formulation: With patient Time For Goal Achievement: 11/20/21 Potential to Achieve Goals: Fair    Frequency Min 3X/week     Co-evaluation               AM-PAC PT "6 Clicks" Mobility  Outcome Measure Help needed turning from your back to your side while in a flat bed without using bedrails?: A Little Help needed moving from lying on your back to sitting on the side of a flat bed without using bedrails?: A Lot Help needed moving to and from a bed to a chair (including a wheelchair)?: A Little Help needed standing up from a chair using your arms (e.g., wheelchair or bedside chair)?: A Little Help needed to walk in hospital room?: A Little Help needed climbing 3-5 steps with a railing? : A Lot 6 Click Score: 16    End of Session Equipment Utilized During Treatment: Gait belt Activity Tolerance: Patient tolerated treatment well Patient left: in chair;with call bell/phone within reach;with chair alarm set;with family/visitor present Nurse Communication: Mobility status PT Visit Diagnosis:  Unsteadiness on feet (R26.81);Difficulty in walking, not elsewhere classified (R26.2);History of falling (Z91.81);Other abnormalities of gait and mobility (R26.89);Pain Pain - part of body:  (back)    Time: 3474-2595 PT Time Calculation (min) (ACUTE ONLY): 18 min   Charges:   PT Evaluation $PT Eval Low Complexity: 1 Low          Mauro Kaufmann PT Acute Rehabilitation Services Pager [phone] Office [phone]   Saige Busby 11/07/2021, 3:54 PM

## 2021-11-07 NOTE — Progress Notes (Signed)
PROGRESS NOTE    Evelyn [name]  [id number] DOB: [birth year] DOA: 11/05/2021 PCP: Wardell Honour, MD   Brief Narrative:  88 y.o. female with medical history significant of cognitive impairment, CKD stage IIIa, hyperlipidemia presented with abdominal pain with diarrhea.  On presentation, UA was suggestive of UTI.  Hemoglobin was 9.8 from 13.5 just 4 months ago.  CT of abdomen/pelvis showed no acute abdominal or pelvic findings.  CT of the head was negative for acute findings.  CT cervical spine showed acute compression fracture of the T1 vertebral body.  ED physician discussed with neurosurgery and no intervention or brace was advised. Recommend conservative treatment.  She was started on IV antibiotics. GI was consulted.  Assessment & Plan:   UTI: Present on admission -Follow cultures.  Continue Rocephin  Acute anemia -Hemoglobin 9.8 from 13.54 months ago.  No obvious hematochezia or melena.  History of gastric ulcer in 2019 -CTA abdomen and pelvis without any acute findings -Continue IV Protonix.  GI following. Continue conservative management for now -Hemoglobin 7.9 this morning.  Monitor H&H.  Cognitive impairment -Monitor mental status.  Patient is not oriented to time.  Fall precautions.  PT eval  Acute T1 compression fracture Subacute L3 mild compression fracture -Likely from baseline osteopenia. -Neurosurgery was consulted by ED physician and recommends conservative management.  No intervention or bracing at this time.  Patient reports chronic back pain that is no worse than baseline at this time.  Lactic acidosis -Questionable cause.  Resolved.   DVT prophylaxis: SCDs Code Status: DNR Family Communication: None at bedside Disposition Plan: Status is: Inpatient Remains inpatient appropriate because: Of severity of illness    Consultants: GI  Procedures: None  Antimicrobials: Rocephin from 11/05/2021 onwards   Subjective: Patient seen and examined at  bedside.  Poor historian.  No overnight fever, vomiting, agitation, seizures reported.  Abdominal pain is improving.  Objective: Vitals:   11/06/21 0733 11/06/21 1157 11/06/21 2101 11/07/21 0602  BP: (!) 137/55 (!) 128/47 (!) 127/58 (!) 132/55  Pulse: 71 63 70 67  Resp: 16 18 16 16   Temp: 98.2 F (36.8 C) 98.5 F (36.9 C) 98.1 F (36.7 C) 98.8 F (37.1 C)  TempSrc:   Oral Oral  SpO2: 100% 100% 100% 100%  Weight:      Height:        Intake/Output Summary (Last 24 hours) at 11/07/2021 0740 Last data filed at 11/07/2021 0200 Gross per 24 hour  Intake 560.55 ml  Output 550 ml  Net 10.55 ml    Filed Weights   11/05/21 2301  Weight: 47.5 kg    Examination:  General: On room air.  No distress.  Elderly female lying in bed. ENT/neck: No thyromegaly.  JVD is not elevated  respiratory: Decreased breath sounds at bases bilaterally with some crackles; no wheezing  CVS: Currently rate controlled; S1 and S2 are heard  abdominal: Soft, mild suprapubic tenderness present, slightly distended; no organomegaly, normal bowel sounds are heard Extremities: Trace lower extremity edema; no cyanosis  CNS: Awake, slow to respond.  No focal neurologic deficit.  Moves extremities Lymph: No obvious lymphadenopathy Skin: No obvious ecchymosis/lesions  psych: Affect is mostly flat.  No signs of agitation.   Musculoskeletal: No obvious joint swelling/deformity     Data Reviewed: I have personally reviewed following labs and imaging studies  CBC: Recent Labs  Lab 11/05/21 1629 11/06/21 0452 11/07/21 0515  WBC 7.9 6.3 5.7  NEUTROABS  --   --  3.8  HGB 9.8* 8.1* 7.9*  HCT 30.0* 25.2* 24.2*  MCV 95.8 96.9 96.4  PLT 230 159 [id number]    Basic Metabolic Panel: Recent Labs  Lab 11/05/21 1629 11/06/21 0452 11/07/21 0515  NA 139 142 143  K 4.2 3.7 4.1  CL 112* 117* 118*  CO2 19* 20* 21*  GLUCOSE 104* 95 103*  BUN 41* 33* 19  CREATININE 1.03* 0.94 1.00  CALCIUM 9.4 8.3* 8.7*  MG  --   --   2.0    GFR: Estimated Creatinine Clearance: 25.2 mL/min (by C-G formula based on SCr of 1 mg/dL). Liver Function Tests: Recent Labs  Lab 11/05/21 1629 11/07/21 0515  AST 42* 47*  ALT 28 23  ALKPHOS 87 70  BILITOT 0.6 0.6  PROT 6.8 5.5*  ALBUMIN 3.5 2.7*    Recent Labs  Lab 11/05/21 1629  LIPASE 77*    No results for input(s): AMMONIA in the last 168 hours. Coagulation Profile: Recent Labs  Lab 11/07/21 0515  INR 1.1   Cardiac Enzymes: No results for input(s): CKTOTAL, CKMB, CKMBINDEX, TROPONINI in the last 168 hours. BNP (last 3 results) No results for input(s): PROBNP in the last 8760 hours. HbA1C: No results for input(s): HGBA1C in the last 72 hours. CBG: No results for input(s): GLUCAP in the last 168 hours. Lipid Profile: No results for input(s): CHOL, HDL, LDLCALC, TRIG, CHOLHDL, LDLDIRECT in the last 72 hours. Thyroid Function Tests: No results for input(s): TSH, T4TOTAL, FREET4, T3FREE, THYROIDAB in the last 72 hours. Anemia Panel: No results for input(s): VITAMINB12, FOLATE, FERRITIN, TIBC, IRON, RETICCTPCT in the last 72 hours. Sepsis Labs: Recent Labs  Lab 11/05/21 2146 11/05/21 2350  LATICACIDVEN 2.9* 1.8     Recent Results (from the past 240 hour(s))  Blood culture (routine x 2)     Status: None (Preliminary result)   Collection Time: 11/05/21  9:51 PM   Specimen: BLOOD  Result Value Ref Range Status   Specimen Description   Final    BLOOD BLOOD RIGHT FOREARM Performed at [facility], [place] [address], [place], [place] [address]    Special Requests   Final    BOTTLES DRAWN AEROBIC AND ANAEROBIC Blood Culture results may not be optimal due to an inadequate volume of blood received in culture bottles Performed at [place] [address], [place], [place] [address]    Culture   Final    NO GROWTH < 12 HOURS Performed at [place] [address] [place], [place] [address]    Report  Status PENDING  Incomplete  Blood culture (routine x 2)     Status: None (Preliminary result)   Collection Time: 11/05/21 11:50 PM   Specimen: BLOOD  Result Value Ref Range Status   Specimen Description   Final    BLOOD RIGHT ANTECUBITAL Performed at [place] [address], [place], [place] [address]    Special Requests   Final    BOTTLES DRAWN AEROBIC ONLY Blood Culture adequate volume Performed at [place] [address], [place], [place] [address]    Culture   Final    NO GROWTH < 12 HOURS Performed at [place] [address], [place],  [address]    Report Status PENDING  Incomplete          Radiology Studies: CT Head Wo Contrast  Result Date: 11/05/2021 CLINICAL DATA:  Unwitnessed fall. EXAM: CT HEAD WITHOUT CONTRAST  TECHNIQUE: Contiguous axial images were obtained from the base of the skull through the vertex without intravenous contrast. RADIATION DOSE REDUCTION: This exam was performed according to the departmental dose-optimization program which includes automated exposure control, adjustment of the mA and/or kV according to patient size and/or use of iterative reconstruction technique. COMPARISON:  MRI February 07, 2017 FINDINGS: Brain: No evidence of acute infarction, hemorrhage, hydrocephalus, extra-axial collection or mass lesion/mass effect. Mild age-related global parenchymal volume loss. Similar asymmetry of the lateral ventricles. Mild burden of chronic ischemic white matter disease. Vascular: No hyperdense vessel. Atherosclerosis of the internal carotid and vertebral arteries. Skull: Normal. Negative for fracture or focal lesion. Sinuses/Orbits: Visualized portions of the paranasal sinuses are predominantly clear. Prior lens surgery. Other: Mastoid air cells are predominantly clear. Probable dense cerumen in the bilateral external auditory canals. IMPRESSION: No acute intracranial abnormality. Electronically  Signed   By: Dahlia Bailiff M.D.   On: 11/05/2021 16:30   CT Cervical Spine Wo Contrast  Result Date: 11/05/2021 CLINICAL DATA:  Neck trauma. Fell. Contemporaneous head CT negative for acute abnormality. EXAM: CT CERVICAL SPINE WITHOUT CONTRAST TECHNIQUE: Multidetector CT imaging of the cervical spine was performed without intravenous contrast. Multiplanar CT image reconstructions were also generated. RADIATION DOSE REDUCTION: This exam was performed according to the departmental dose-optimization program which includes automated exposure control, adjustment of the mA and/or kV according to patient size and/or use of iterative reconstruction technique. COMPARISON:  None Available. FINDINGS: Alignment: There is a minimal 1-2 mm anterolisthesis at C2-3, C3-4, C4-5 and C7-T1, 2 mm anterolisthesis T1-2, all most likely degenerative etiology. No traumatic listhesis is suspected. There is narrowing and osteophyte formation at the anterior atlantodental joint. Skull base and vertebrae: Osteopenia. The skull base, mastoid air cells and middle ears are unremarkable. There is mild acute upper plate anterior wedge compression fracture of the T1 vertebral body with compaction of trabecular elements in the superior vertebral body, slight posterosuperior cortical retropulsion, and nondisplaced fragmentation along the anterior superior vertebral body. Loss of anterior height at T1 is no more than 20%, posterior height loss no more than 10%. No cervical fracture is evident and no primary bone lesion is suspected. Soft tissues and spinal canal: There is mild soft tissue reaction in the anterior prevertebral plane alongside T1 but no space-occupying hematoma. There is no further prevertebral swelling or fluid. No visible canal hematoma. There are moderate calcifications in both carotid bulbs. Disc levels: No herniated discs or cord compromise are noted at any level including T1. There is multilevel facet joint and uncinate  spurring. There is partial disc space loss C3-4, C5-6 and C6-7 and slight disc narrowing C4-5. The other discs are normal in heights. There are small posterior osteophytes at C3-4 without stenosis, small bidirectional osteophytes at C5-6 and C6-7 also without stenosis. No cervical levels demonstrate critical foraminal stenosis. There is mild foraminal stenosis on the right at C5-6. Upper chest: Lung apices demonstrate interstitial reticulation which is most likely due to chronic interstitial disease, less likely could indicate edema in the proper clinical setting. Other: There is asymmetric joint space loss and spurring of the left TMJ. Incidental note is made of bilateral mandibular tori. There is advanced carious decay of the right maxillary 6 year molar. Follow-up with a dentist is recommended. IMPRESSION: 1. Acute mild upper plate anterior wedge compression fracture T1 vertebral body. 2. Slight posterosuperior cortical retropulsion with 20% anterior and 10% posterior height loss, with only mild prevertebral soft tissue reaction and no intracanalicular or  paraspinal hematoma. 3. Osteopenia and degenerative change without evidence of cervical fractures. 4. Interstitial reticulation in the lung apices which is most likely chronic, less likely could be interstitial edema. 5. Carotid atherosclerosis. 6. Severe decay of the right maxillary 6 year molar. Follow-up with a dentist is recommended. 7. Asymmetric left TMJ DJD. 8. Discussed over the phone with Dr. Wyvonnia Dusky at 9:04 p.m., 11/05/2021 with acknowledgement of findings. Electronically Signed   By: Telford Nab M.D.   On: 11/05/2021 21:05   CT ABDOMEN PELVIS W CONTRAST  Result Date: 11/05/2021 CLINICAL DATA:  Acute abdominal pain EXAM: CT ABDOMEN AND PELVIS WITH CONTRAST TECHNIQUE: Multidetector CT imaging of the abdomen and pelvis was performed using the standard protocol following bolus administration of intravenous contrast. RADIATION DOSE REDUCTION: This  exam was performed according to the departmental dose-optimization program which includes automated exposure control, adjustment of the mA and/or kV according to patient size and/or use of iterative reconstruction technique. CONTRAST:  17mL OMNIPAQUE IOHEXOL 300 MG/ML  SOLN COMPARISON:  12/08/2007 FINDINGS: Motion degraded images. Lower chest: Lung bases are clear. Hepatobiliary: 11 mm cyst in the central right liver (series 11/image 5). Calcified hepatic granuloma. Liver is otherwise within normal limits. Gallbladder is unremarkable. No intrahepatic or extrahepatic ductal dilatation. Pancreas: Within normal limits. Spleen: Calcified splenic granuloma. Adrenals/Urinary Tract: Adrenal glands are within normal limits. Bilateral renal cysts, measuring up to 4.7 cm in the left upper kidney (series 11/image 11). No hydronephrosis. Bladder is within normal limits. Stomach/Bowel: Stomach is notable for a moderate hiatal hernia, incompletely visualized. No evidence of bowel obstruction. Appendix is poorly visualized but grossly unremarkable (series 2/image 52). No colonic wall thickening or inflammatory changes. Vascular/Lymphatic: No evidence of abdominal aortic aneurysm. Atherosclerotic calcifications of the abdominal aorta and branch vessels. No suspicious abdominopelvic lymphadenopathy. Reproductive: Status post hysterectomy. No adnexal masses. Other: No abdominopelvic ascites. Musculoskeletal: Mild superior endplate compression fracture deformity at T12 and L3, age indeterminate but likely chronic. No retropulsion. IMPRESSION: No CT findings to account for the patient's acute abdominal pain. Additional ancillary findings as above. Electronically Signed   By: Julian Hy M.D.   On: 11/05/2021 20:53   CT Angio Abd/Pel W and/or Wo Contrast  Result Date: 11/05/2021 CLINICAL DATA:  Suspected mesenteric ischemia. EXAM: CTA ABDOMEN AND PELVIS WITHOUT AND WITH CONTRAST TECHNIQUE: Multidetector CT imaging of the  abdomen and pelvis was performed using the standard protocol during bolus administration of intravenous contrast. Multiplanar reconstructed images and MIPs were obtained and reviewed to evaluate the vascular anatomy. RADIATION DOSE REDUCTION: This exam was performed according to the departmental dose-optimization program which includes automated exposure control, adjustment of the mA and/or kV according to patient size and/or use of iterative reconstruction technique. CONTRAST:  3mL OMNIPAQUE IOHEXOL 300 MG/ML  SOLN COMPARISON:  CT abdomen and pelvis with IV contrast earlier today, CT abdomen and pelvis with IV and oral contrast 12/08/2007. FINDINGS: VASCULAR Aorta: Abdominal aorta is tortuous with heavy mixed plaque. Some of the soft plaque shows mild ulcerative features but there is no penetrating ulcer, aneurysm, dissection or critical stenosis. Celiac: There are ostial calcifications causing a 50% calcific origin stenosis of this vessel. The remainder of the vessel and its branch arteries are patent. SMA: There are minimal scattered mixed plaques but no flow-limiting stenosis, aneurysm or dissection. Renals: There are bilateral single renal arteries. There are calcifications in the proximal right renal artery causing up to 60% stenosis in the proximal 1 cm of the vessel. There are nonstenosing ostial calcifications  in the left renal artery origin. Both renal arteries are otherwise clear without aneurysm or dissection. IMA: There is a high-grade mixed plaque origin stenosis of this vessel. The IMA otherwise shows normal opacification. Inflow: There patchy moderate calcifications in the common iliac and internal iliac arteries. There is no more than 30% stenosis in the left common iliac artery proximally, no significant narrowing of the right common iliac. There is no flow-limiting stenosis despite patchy calcifications in either internal iliac artery. There is mild scattered nonstenosing plaque in the internal  iliac arteries. Proximal Outflow: There nonstenosing calcifications in both common iliac arteries. The visualized deep and circumflex femoral arteries are patent. Proximal left superficial femoral artery is patent. There is a focal stenosis in the proximal right superficial femoral artery up to 50% due to vessel thrombus, otherwise the visualized right SFA is patent. Veins: Patent portal vein, IVC and deep pelvic veins. Review of the MIP images confirms the above findings. NON-VASCULAR Lower chest: There are COPD and scarring changes in the lung bases, elevated right hemidiaphragm large hiatal hernia stomach majority intrathoracic. There are coronary artery calcifications mild cardiomegaly. Hepatobiliary: 1.3 cm cyst in the dome of the right lobe of the liver is noted, previously 1 cm in 2009. There is a calcified granuloma in the anterior segment of the right lobe. The liver slightly steatotic and otherwise unremarkable. Pancreas: No focal abnormality. Spleen: Single calcified granuloma. No mass enhancement. No splenomegaly. Adrenals/Urinary Tract: No adrenal mass. No renal mass enhancement with multiple bilateral cysts described previously. There is no hydronephrosis. No bladder thickening. Dense contrast opacifies the bladder from today's earlier abdomen pelvis CT. Stomach/Bowel: No wall thickening or dilatation including the appendix. There are colonic diverticula without inflammatory change. Lymphatic: No adenopathy.  Pelvic phleboliths. Reproductive: Status post hysterectomy.  No adnexal mass. Other: Small umbilical fat hernia.  No free air, hemorrhage or fluid Musculoskeletal: Osteopenia. Mild-to-moderate chronic compression fracture T12 vertebral body is unchanged since 2009. Mild generalized upper plate compression fracture of L3 was not seen in 2009 or the lumbar MRI from 12/03/2017, age indeterminate but no adjacent hematoma. There is S shaped lower thoracic/lumbar scoliosis, degenerative changes.  IMPRESSION: VASCULAR 1. Aortic and branch vessel atherosclerosis described above. No dissection or aneurysm. Some of the aortic soft plaque shows mild ulcerative features but there is no penetrating ulcer. 2. There is a high-grade IMA origin stenosis, but there is no flow-limiting stenosis in the SMA and the remainder of the IMA opacifies normally. 3. There is a 50% calcific origin stenosis in the celiac artery, which is otherwise clear, and a 60% calcific stenosis in the proximal right renal artery, with the renal arteries otherwise without stenosis. 4. 50% focal soft plaque stenosis in the proximal right superficial femoral artery. No other appreciable significant inflow or proximal outflow stenosis with calcific changes described above. 5. Coronary artery atherosclerosis.  Mild cardiomegaly. NON-VASCULAR 1. Age indeterminate mild upper plate generalized compression fracture deformity of L3 vertebral body, is probably subacute given the lack of adjacent soft tissue reaction, most recent lumbar imaging is MRI 12/03/2017 and this was not present at that time. 2. No acute abdominal or pelvic findings. 3. Large hiatal hernia and additional chronic findings discussed above. Electronically Signed   By: Telford Nab M.D.   On: 11/05/2021 23:06        Scheduled Meds:  FLUoxetine  20 mg Oral Daily   pantoprazole (PROTONIX) IV  40 mg Intravenous Q12H   phytonadione  10 mg Oral Daily  zolpidem  5 mg Oral QHS   Continuous Infusions:  cefTRIAXone (ROCEPHIN)  IV 1 g (11/06/21 2113)          Aline August, MD [facility] 11/07/2021, 7:40 AM

## 2021-11-07 NOTE — Progress Notes (Addendum)
   Patient Name: Evelyn [name] Date of Encounter: 11/07/2021, 10:38 AM    Subjective  Feeling better Does not know if she has defecated None recorded   Objective  BP (!) 132/55 (BP Location: Right Arm)   Pulse 67   Temp 98.8 F (37.1 C) (Oral)   Resp 16   Ht 5\' 5"  (1.651 m)   Wt 47.5 kg   SpO2 100%   BMI 17.43 kg/m  Elderly NAD Abd benign NT  Recent Labs  Lab 11/05/21 1629 11/06/21 0452 11/07/21 0515  HGB 9.8* 8.1* 7.9*  HCT 30.0* 25.2* 24.2*  WBC 7.9 6.3 5.7  PLT 230 159 163   Recent Labs  Lab 11/05/21 1629 11/06/21 0452 11/07/21 0515  NA 139 142 143  K 4.2 3.7 4.1  CL 112* 117* 118*  CO2 19* 20* 21*  GLUCOSE 104* 95 103*  BUN 41* 33* 19  CREATININE 1.03* 0.94 1.00  CALCIUM 9.4 8.3* 8.7*  MG  --   --  2.0   Ferritin, B12 and folate not drawn    Assessment and Plan  Melena? Decreased Hgb Hgb has stabilized no signs of bleeding  Have asked for labs not done to be drawn Continue supportive care  I called son at his request asking to address constipation "has tried everything"  Will make recommendations tomorrow If low ferritin would consider parenteral dose iron give constipation   11/09/21, MD, [facility] Gastroenterology See [facility] on call - gastroenterology for best contact person 11/07/2021 10:38 AM  Lab Results  Component Value Date   FERRITIN 28 11/07/2021   Low NL - given anemia I ordered parenteral iron  CE[facility]

## 2021-11-07 NOTE — TOC Progression Note (Signed)
Transition of Care [facility]) - Progression Note    Patient Details  Name: Evelyn [name] MRN: [id number] Date of Birth: [birth year]  Transition of Care [facility]) CM/SW Contact  Ross Ludwig, [place] Phone Number: 11/07/2021, 6:08 PM  Clinical Narrative:     Patient was seen by PT, information was faxed out, awaiting bed offers if patient is agreeable.  Expected Discharge Plan: Home/Self Care Barriers to Discharge: No Barriers Identified  Expected Discharge Plan and Services Expected Discharge Plan: Home/Self Care   Discharge Planning Services: CM Consult   Living arrangements for the past 2 months: Single Family Home                                       Social Determinants of Health (SDOH) Interventions    Readmission Risk Interventions     View : No data to display.

## 2021-11-08 ENCOUNTER — Inpatient Hospital Stay (HOSPITAL_COMMUNITY): Payer: Medicare Other

## 2021-11-08 DIAGNOSIS — K921 Melena: Secondary | ICD-10-CM | POA: Diagnosis not present

## 2021-11-08 DIAGNOSIS — R4189 Other symptoms and signs involving cognitive functions and awareness: Secondary | ICD-10-CM | POA: Diagnosis not present

## 2021-11-08 DIAGNOSIS — K5909 Other constipation: Secondary | ICD-10-CM | POA: Diagnosis not present

## 2021-11-08 DIAGNOSIS — R195 Other fecal abnormalities: Secondary | ICD-10-CM | POA: Diagnosis not present

## 2021-11-08 DIAGNOSIS — I10 Essential (primary) hypertension: Secondary | ICD-10-CM

## 2021-11-08 DIAGNOSIS — S22000A Wedge compression fracture of unspecified thoracic vertebra, initial encounter for closed fracture: Secondary | ICD-10-CM | POA: Diagnosis not present

## 2021-11-08 DIAGNOSIS — K279 Peptic ulcer, site unspecified, unspecified as acute or chronic, without hemorrhage or perforation: Secondary | ICD-10-CM | POA: Diagnosis not present

## 2021-11-08 DIAGNOSIS — E876 Hypokalemia: Secondary | ICD-10-CM

## 2021-11-08 DIAGNOSIS — D649 Anemia, unspecified: Secondary | ICD-10-CM | POA: Diagnosis not present

## 2021-11-08 LAB — BASIC METABOLIC PANEL
Anion gap: 6 (ref 5–15)
BUN: 22 mg/dL (ref 8–23)
CO2: 19 mmol/L — ABNORMAL LOW (ref 22–32)
Calcium: 8.9 mg/dL (ref 8.9–10.3)
Chloride: 116 mmol/L — ABNORMAL HIGH (ref 98–111)
Creatinine, Ser: 0.93 mg/dL (ref 0.44–1.00)
GFR, Estimated: 57 mL/min — ABNORMAL LOW (ref >60)
Glucose, Bld: 97 mg/dL (ref 70–99)
Potassium: 3.4 mmol/L — ABNORMAL LOW (ref 3.5–5.1)
Sodium: 141 mmol/L (ref 135–145)

## 2021-11-08 LAB — CBC WITH DIFFERENTIAL/PLATELET
Abs Immature Granulocytes: 0.06 10*3/uL (ref 0.00–0.07)
Basophils Absolute: 0 10*3/uL (ref 0.0–0.1)
Basophils Relative: 1 %
Eosinophils Absolute: 0.2 10*3/uL (ref 0.0–0.5)
Eosinophils Relative: 4 %
HCT: 26.5 % — ABNORMAL LOW (ref 36.0–46.0)
Hemoglobin: 8.7 g/dL — ABNORMAL LOW (ref 12.0–15.0)
Immature Granulocytes: 1 %
Lymphocytes Relative: 20 %
Lymphs Abs: 1.3 10*3/uL (ref 0.7–4.0)
MCH: 31 pg (ref 26.0–34.0)
MCHC: 32.8 g/dL (ref 30.0–36.0)
MCV: 94.3 fL (ref 80.0–100.0)
Monocytes Absolute: 1 10*3/uL (ref 0.1–1.0)
Monocytes Relative: 15 %
Neutro Abs: 3.8 10*3/uL (ref 1.7–7.7)
Neutrophils Relative %: 59 %
Platelets: 187 10*3/uL (ref 150–400)
RBC: 2.81 MIL/uL — ABNORMAL LOW (ref 3.87–5.11)
RDW: 14.5 % (ref 11.5–15.5)
WBC: 6.3 10*3/uL (ref 4.0–10.5)
nRBC: 0 % (ref 0.0–0.2)

## 2021-11-08 LAB — URINE CULTURE: Culture: >=100000 — AB

## 2021-11-08 LAB — MAGNESIUM: Magnesium: 2 mg/dL (ref 1.7–2.4)

## 2021-11-08 MED ORDER — OXYBUTYNIN CHLORIDE 5 MG PO TABS: 5.0000 mg | ORAL_TABLET | Freq: Three times a day (TID) | ORAL | Status: DC

## 2021-11-08 MED ORDER — MORPHINE SULFATE (PF) 2 MG/ML IV SOLN: 1.0000 mg | INTRAVENOUS | Status: DC | PRN

## 2021-11-08 MED ORDER — AMOXICILLIN 250 MG PO CAPS
250.0000 mg | ORAL_CAPSULE | Freq: Two times a day (BID) | ORAL | Status: AC
  Administered 2021-11-08 – 2021-11-09 (×3): 250 mg via ORAL
  Filled 2021-11-08 (×3): qty 1

## 2021-11-08 MED ORDER — POLYETHYLENE GLYCOL 3350 17 G PO PACK
17.0000 g | PACK | Freq: Two times a day (BID) | ORAL | Status: DC
  Administered 2021-11-08 – 2021-11-09 (×4): 17 g via ORAL
  Filled 2021-11-08 (×4): qty 1

## 2021-11-08 MED ORDER — POTASSIUM CHLORIDE CRYS ER 20 MEQ PO TBCR
40.0000 meq | EXTENDED_RELEASE_TABLET | Freq: Once | ORAL | Status: AC
Start: 2021-11-08 — End: 2021-11-08
  Administered 2021-11-08: 40 meq via ORAL
  Filled 2021-11-08: qty 2

## 2021-11-08 MED ORDER — BISACODYL 5 MG PO TBEC
10.0000 mg | DELAYED_RELEASE_TABLET | ORAL | Status: DC
  Administered 2021-11-08: 10 mg via ORAL
  Filled 2021-11-08: qty 2

## 2021-11-08 MED ORDER — PANTOPRAZOLE SODIUM 40 MG PO TBEC
40.0000 mg | DELAYED_RELEASE_TABLET | Freq: Every day | ORAL | Status: DC
Start: 2021-11-09 — End: 2021-11-10
  Administered 2021-11-09: 40 mg via ORAL
  Filled 2021-11-08: qty 1

## 2021-11-08 MED ORDER — OXYBUTYNIN CHLORIDE 5 MG PO TABS
2.5000 mg | ORAL_TABLET | Freq: Three times a day (TID) | ORAL | Status: DC
  Administered 2021-11-08 – 2021-11-09 (×5): 2.5 mg via ORAL
  Filled 2021-11-08 (×5): qty 1

## 2021-11-08 MED ORDER — TRAMADOL HCL 50 MG PO TABS: 50.0000 mg | ORAL_TABLET | Freq: Two times a day (BID) | ORAL | Status: DC | PRN

## 2021-11-08 MED ORDER — AMLODIPINE BESYLATE 10 MG PO TABS
10.0000 mg | ORAL_TABLET | Freq: Every day | ORAL | Status: DC
  Administered 2021-11-08 – 2021-11-09 (×2): 10 mg via ORAL
  Filled 2021-11-08 (×2): qty 1

## 2021-11-08 MED ORDER — PHENAZOPYRIDINE HCL 100 MG PO TABS: 100.0000 mg | ORAL_TABLET | Freq: Three times a day (TID) | ORAL | Status: DC

## 2021-11-08 NOTE — Progress Notes (Signed)
Patient very confused this shift, very anxious and making comments that staff "kidnapped her." Pt reoriented by staff frequently. Pt's son/daughter very concerned, states this confusion is new. Pt also c/o worsening pain with urination and "deep pain." Pt states, "this is the same pain that caused me to have to go to the hospital last week." SBP also consistently 170s. MD Hanley Ben made aware of all above. See new orders.

## 2021-11-08 NOTE — TOC PASRR Note (Signed)
30 Day PASRR Note   Patient Details  Name: Evelyn [name] Date of Birth: Dec 12, 1924   Transition of Care [facility]) CM/SW Contact:    Darleene Cleaver, LCSW Phone Number: 11/08/2021, 6:09 PM  To Whom It May Concern:  Please be advised that this patient will require a short-term nursing home stay - anticipated 30 days or less for rehabilitation and strengthening.   The plan is for return home.

## 2021-11-08 NOTE — Progress Notes (Addendum)
   Patient Name: Evelyn [name] Date of Encounter: 11/08/2021, 9:04 AM    Subjective  Concerned about Alzheimer's disease Confused some today No pain   Objective  BP (!) 179/61 (BP Location: Left Arm)   Pulse 66   Temp 98.5 F (36.9 C) (Oral)   Resp 18   Ht 5\' 5"  (1.651 m)   Wt 47.5 kg   SpO2 99%   BMI 17.43 kg/m       Latest Ref Rng & Units 11/08/2021    4:36 AM 11/07/2021    5:15 AM 11/06/2021    4:52 AM  CBC  WBC 4.0 - 10.5 K/uL 6.3   5.7   6.3    Hemoglobin 12.0 - 15.0 g/dL 8.7   7.9   8.1    Hematocrit 36.0 - 46.0 % 26.5   24.2   25.2    Platelets 150 - 400 K/uL 187   163   159      Lab Results  Component Value Date   FERRITIN 28 11/07/2021   Lab Results  Component Value Date   VITAMINB12 3,498 (H) 11/07/2021   Lab Results  Component Value Date   FOLATE 19.9 11/07/2021   Lab Results  Component Value Date   CREATININE 0.93 11/08/2021   BUN 22 11/08/2021   NA 141 11/08/2021   K 3.4 (L) 11/08/2021   CL 116 (H) 11/08/2021   CO2 19 (L) 11/08/2021       Assessment and Plan  Melena? Decreased Hgb/Acute anemia Chronic constipation  Hx PUD  We suspect upper GI bleed likely recurrent PUD (she was off recommended PPI) - conservative approach planned  D/w Dr. Starla Link - she is to go to SNF  Has appt Dr. Ardis Hughs in July  I ordered iron dextran yesterday and she received   Rec:  Daily PO PPI - ordered  - should stay on chronically MiraLax bid and qod dulcolax F/u H/H at SNF  Signing off  Gatha Mayer, MD, [facility] Gastroenterology See Shea Evans on call - gastroenterology for best contact person 11/08/2021 9:09 AM   Gatha Mayer, MD, [place] See Shea Evans on call - gastroenterology for best contact person 11/08/2021 9:04 AM

## 2021-11-08 NOTE — Progress Notes (Signed)
PROGRESS NOTE    Evelyn [name]  [id number] DOB: [birth year] DOA: 11/05/2021 PCP: Wardell Honour, MD   Brief Narrative:  88 y.o. female with medical history significant of cognitive impairment, CKD stage IIIa, hyperlipidemia presented with abdominal pain with diarrhea.  On presentation, UA was suggestive of UTI.  Hemoglobin was 9.8 from 13.5 just 4 months ago.  CT of abdomen/pelvis showed no acute abdominal or pelvic findings.  CT of the head was negative for acute findings.  CT cervical spine showed acute compression fracture of the T1 vertebral body.  ED physician discussed with neurosurgery and no intervention or brace was advised. Recommend conservative treatment.  She was started on IV antibiotics. GI was consulted.  Assessment & Plan:   UTI: Present on admission -Urine culture growing E. coli: Follow sensitivities.  Continue Rocephin  Acute anemia -Hemoglobin 9.8 from 13.54 months ago.  No obvious hematochezia or melena.  History of gastric ulcer in 2019 -CTA abdomen and pelvis without any acute findings -Continue IV Protonix.  GI following. Continue conservative management for now.  Patient is also on oral vitamin K per GI as per family request. -Hemoglobin 8.7 this morning.  Monitor H&H.  Cognitive impairment Physical deconditioning -Monitor mental status.  Patient is not oriented to time.  Fall precautions.  PT recommended SNF placement.  Social worker consult.  Acute T1 compression fracture Subacute L3 mild compression fracture -Likely from baseline osteopenia. -Neurosurgery was consulted by ED physician and recommends conservative management.  No intervention or bracing at this time.  Patient reports chronic back pain that is no worse than baseline at this time.  Lactic acidosis -Questionable cause.  Resolved.  Hypokalemia -Replace  Hypertension -Blood pressure on the higher side.  Monitor.   DVT prophylaxis: SCDs Code Status: DNR Family Communication: None  at bedside Disposition Plan: Status is: Inpatient Remains inpatient appropriate because: Of severity of illness.  Need for SNF placement.    Consultants: GI  Procedures: None  Antimicrobials: Rocephin from 11/05/2021 onwards   Subjective: Patient seen and examined at bedside.  Poor historian.  Denies worsening abdominal pain.  No fever, agitation, chest pain reported  objective: Vitals:   11/07/21 1212 11/07/21 2014 11/07/21 2015 11/08/21 0442  BP: (!) 141/55 (!) 167/83 (!) 182/69 (!) 179/61  Pulse: 70 73 72 66  Resp: 16 18 18 18   Temp: 98.1 F (36.7 C)  97.8 F (36.6 C) 98.5 F (36.9 C)  TempSrc: Oral  Oral Oral  SpO2: 100% 99% 96% 99%  Weight:      Height:        Intake/Output Summary (Last 24 hours) at 11/08/2021 0731 Last data filed at 11/08/2021 0400 Gross per 24 hour  Intake 786.86 ml  Output --  Net 786.86 ml    Filed Weights   11/05/21 2301  Weight: 47.5 kg    Examination:  General: On room air.  No distress.  Elderly female lying in bed.  Slow to respond, poor historian. Awake but confused. respiratory: Decreased breath sounds at bases bilaterally with some crackles CVS: Currently rate controlled; S1-S2 heard  abdominal: Soft, nontender, slightly distended, no organomegaly; normal bowel sounds are heard  extremities: Trace lower extremity edema; no clubbing.        Data Reviewed: I have personally reviewed following labs and imaging studies  CBC: Recent Labs  Lab 11/05/21 1629 11/06/21 0452 11/07/21 0515 11/08/21 0436  WBC 7.9 6.3 5.7 6.3  NEUTROABS  --   --  3.8 3.8  HGB 9.8* 8.1* 7.9* 8.7*  HCT 30.0* 25.2* 24.2* 26.5*  MCV 95.8 96.9 96.4 94.3  PLT 230 159 163 187    Basic Metabolic Panel: Recent Labs  Lab 11/05/21 1629 11/06/21 0452 11/07/21 0515 11/08/21 0436  NA 139 142 143 141  K 4.2 3.7 4.1 3.4*  CL 112* 117* 118* 116*  CO2 19* 20* 21* 19*  GLUCOSE 104* 95 103* 97  BUN 41* 33* 19 22  CREATININE 1.03* 0.94 1.00 0.93   CALCIUM 9.4 8.3* 8.7* 8.9  MG  --   --  2.0 2.0    GFR: Estimated Creatinine Clearance: 27.1 mL/min (by C-G formula based on SCr of 0.93 mg/dL). Liver Function Tests: Recent Labs  Lab 11/05/21 1629 11/07/21 0515  AST 42* 47*  ALT 28 23  ALKPHOS 87 70  BILITOT 0.6 0.6  PROT 6.8 5.5*  ALBUMIN 3.5 2.7*    Recent Labs  Lab 11/05/21 1629  LIPASE 77*    No results for input(s): AMMONIA in the last 168 hours. Coagulation Profile: Recent Labs  Lab 11/07/21 0515  INR 1.1    Cardiac Enzymes: No results for input(s): CKTOTAL, CKMB, CKMBINDEX, TROPONINI in the last 168 hours. BNP (last 3 results) No results for input(s): PROBNP in the last 8760 hours. HbA1C: No results for input(s): HGBA1C in the last 72 hours. CBG: No results for input(s): GLUCAP in the last 168 hours. Lipid Profile: No results for input(s): CHOL, HDL, LDLCALC, TRIG, CHOLHDL, LDLDIRECT in the last 72 hours. Thyroid Function Tests: No results for input(s): TSH, T4TOTAL, FREET4, T3FREE, THYROIDAB in the last 72 hours. Anemia Panel: Recent Labs    11/07/21 0514  VITAMINB12 3,498*  FOLATE 19.9  FERRITIN 28   Sepsis Labs: Recent Labs  Lab 11/05/21 2146 11/05/21 2350  LATICACIDVEN 2.9* 1.8     Recent Results (from the past 240 hour(s))  Urine Culture     Status: Abnormal (Preliminary result)   Collection Time: 11/05/21  9:30 PM   Specimen: Urine, Clean Catch  Result Value Ref Range Status   Specimen Description   Final    URINE, CLEAN CATCH Performed at [facility], [address], [place], [place] [address]    Special Requests   Final    NONE Performed at [facility], [address], [place], [place] [address]    Culture (A)  Final    >=100,000 COLONIES/mL ESCHERICHIA COLI SUSCEPTIBILITIES TO FOLLOW Performed at [facility], [address], [place], [place] [address]    Report Status PENDING  Incomplete  Blood culture (routine x 2)      Status: None (Preliminary result)   Collection Time: 11/05/21  9:51 PM   Specimen: BLOOD  Result Value Ref Range Status   Specimen Description   Final    BLOOD BLOOD RIGHT FOREARM Performed at [facility], [address] [place], [place] [address]    Special Requests   Final    BOTTLES DRAWN AEROBIC AND ANAEROBIC Blood Culture results may not be optimal due to an inadequate volume of blood received in culture bottles Performed at [facility], [address], [place], [place] [address]    Culture   Final    NO GROWTH 2 DAYS Performed at [facility], [address], [place], [place] [address]    Report Status PENDING  Incomplete  Blood culture (routine x 2)     Status: None (Preliminary result)  Collection Time: 11/05/21 11:50 PM   Specimen: BLOOD  Result Value Ref Range Status   Specimen Description   Final    BLOOD RIGHT ANTECUBITAL Performed at [facility] [address] [place], [place] [address]    Special Requests   Final    BOTTLES DRAWN AEROBIC ONLY Blood Culture adequate volume Performed at [facility] [address] [place], [place] [address]    Culture   Final    NO GROWTH 1 DAY Performed at [facility], [place] [address] [place],  [address]    Report Status PENDING  Incomplete          Radiology Studies: CT HEAD WO CONTRAST (5MM)  Result Date: 11/08/2021 CLINICAL DATA:  88 year old female with altered mental status. EXAM: CT HEAD WITHOUT CONTRAST TECHNIQUE: Contiguous axial images were obtained from the base of the skull through the vertex without intravenous contrast. RADIATION DOSE REDUCTION: This exam was performed according to the departmental dose-optimization program which includes automated exposure control, adjustment of the mA and/or kV according to patient size and/or use of iterative reconstruction technique. COMPARISON:  Brain MRI 02/07/2017. Head  CT 11/05/2021. FINDINGS: Brain: Stable cerebral volume. Chronic asymmetry of the left lateral ventricle appears stable since 2018. Chronic encephalomalacia right caudate and/or external capsule. Stable gray-white matter differentiation throughout the brain. No midline shift, ventriculomegaly, mass effect, evidence of mass lesion, intracranial hemorrhage or evidence of cortically based acute infarction. Vascular: Calcified atherosclerosis at the skull base. No suspicious intracranial vascular hyperdensity. Skull: No acute osseous abnormality identified. Sinuses/Orbits: Visualized paranasal sinuses and mastoids are stable and well aerated. Other: No acute orbit or scalp soft tissue finding. IMPRESSION: 1. No acute intracranial abnormality. 2. Stable non contrast CT appearance of the brain recently with chronic small vessel ischemia in the right basal ganglia. Electronically Signed   By: Genevie Ann M.D.   On: 11/08/2021 06:00        Scheduled Meds:  FLUoxetine  20 mg Oral Daily   folic acid  1 mg Oral Daily   pantoprazole (PROTONIX) IV  40 mg Intravenous Q12H   phytonadione  10 mg Oral Daily   zolpidem  5 mg Oral QHS   Continuous Infusions:  cefTRIAXone (ROCEPHIN)  IV 1 g (11/07/21 2212)          Aline August, MD [facility] 11/08/2021, 7:31 AM

## 2021-11-09 DIAGNOSIS — R410 Disorientation, unspecified: Secondary | ICD-10-CM

## 2021-11-09 DIAGNOSIS — R4189 Other symptoms and signs involving cognitive functions and awareness: Secondary | ICD-10-CM | POA: Diagnosis not present

## 2021-11-09 DIAGNOSIS — S22000A Wedge compression fracture of unspecified thoracic vertebra, initial encounter for closed fracture: Secondary | ICD-10-CM | POA: Diagnosis not present

## 2021-11-09 DIAGNOSIS — D649 Anemia, unspecified: Secondary | ICD-10-CM | POA: Diagnosis not present

## 2021-11-09 DIAGNOSIS — N3000 Acute cystitis without hematuria: Secondary | ICD-10-CM | POA: Diagnosis not present

## 2021-11-09 LAB — BASIC METABOLIC PANEL
Anion gap: 6 (ref 5–15)
BUN: 17 mg/dL (ref 8–23)
CO2: 22 mmol/L (ref 22–32)
Calcium: 8.9 mg/dL (ref 8.9–10.3)
Chloride: 114 mmol/L — ABNORMAL HIGH (ref 98–111)
Creatinine, Ser: 0.8 mg/dL (ref 0.44–1.00)
GFR, Estimated: >60 mL/min (ref >60)
Glucose, Bld: 88 mg/dL (ref 70–99)
Potassium: 3.9 mmol/L (ref 3.5–5.1)
Sodium: 142 mmol/L (ref 135–145)

## 2021-11-09 LAB — CBC WITH DIFFERENTIAL/PLATELET
Abs Immature Granulocytes: 0.07 10*3/uL (ref 0.00–0.07)
Basophils Absolute: 0 10*3/uL (ref 0.0–0.1)
Basophils Relative: 1 %
Eosinophils Absolute: 0.3 10*3/uL (ref 0.0–0.5)
Eosinophils Relative: 5 %
HCT: 25.2 % — ABNORMAL LOW (ref 36.0–46.0)
Hemoglobin: 8.3 g/dL — ABNORMAL LOW (ref 12.0–15.0)
Immature Granulocytes: 1 %
Lymphocytes Relative: 17 %
Lymphs Abs: 1.1 10*3/uL (ref 0.7–4.0)
MCH: 31 pg (ref 26.0–34.0)
MCHC: 32.9 g/dL (ref 30.0–36.0)
MCV: 94 fL (ref 80.0–100.0)
Monocytes Absolute: 0.9 10*3/uL (ref 0.1–1.0)
Monocytes Relative: 14 %
Neutro Abs: 3.9 10*3/uL (ref 1.7–7.7)
Neutrophils Relative %: 62 %
Platelets: 215 10*3/uL (ref 150–400)
RBC: 2.68 MIL/uL — ABNORMAL LOW (ref 3.87–5.11)
RDW: 14.7 % (ref 11.5–15.5)
WBC: 6.3 10*3/uL (ref 4.0–10.5)
nRBC: 0 % (ref 0.0–0.2)

## 2021-11-09 LAB — MAGNESIUM: Magnesium: 1.8 mg/dL (ref 1.7–2.4)

## 2021-11-09 NOTE — TOC Progression Note (Addendum)
Transition of Care [facility]) - Progression Note    Patient Details  Name: Evelyn [name] MRN: [id number] Date of Birth: [birth year]  Transition of Care [facility]) CM/SW Contact  Chonte Ricke, Olegario Messier, RN Phone Number: 11/09/2021, 2:52 PM  Clinical Narrative:   Awaiting pasrr for SNF. Will infom family of bed offers-unable to leave vm w/dtr since it is full ;spoke to Son [name]-informed of bed offers will leave in rm. -received pasrr.    Expected Discharge Plan: Skilled Nursing Facility Barriers to Discharge: Continued Medical Work up  Expected Discharge Plan and Services Expected Discharge Plan: Skilled Nursing Facility   Discharge Planning Services: CM Consult   Living arrangements for the past 2 months: Single Family Home                                       Social Determinants of Health (SDOH) Interventions    Readmission Risk Interventions     View : No data to display.

## 2021-11-09 NOTE — Progress Notes (Signed)
PROGRESS NOTE    Evelyn [name]  [id number] DOB: 09/05/1924 DOA: 11/05/2021 PCP: Wardell Honour, MD   Brief Narrative:  88 y.o. female with medical history significant of cognitive impairment, CKD stage IIIa, hyperlipidemia presented with abdominal pain with diarrhea.  On presentation, UA was suggestive of UTI.  Hemoglobin was 9.8 from 13.5 just 4 months ago.  CT of abdomen/pelvis showed no acute abdominal or pelvic findings.  CT of the head was negative for acute findings.  CT cervical spine showed acute compression fracture of the T1 vertebral body.  ED physician discussed with neurosurgery and no intervention or brace was advised. Recommend conservative treatment.  She was started on IV antibiotics. GI was consulted.  She was managed conservatively.  GI subsequently signed off.  PT recommended SNF placement.  Assessment & Plan:   UTI: Present on admission -Urine culture growing pansensitive E. coli.  Rocephin switched to amoxicillin.  Complete 5-day course of therapy.  Acute anemia -Hemoglobin 9.8 from 13.5 4 months ago.  No obvious hematochezia or melena.  History of gastric ulcer in 2019 -CTA abdomen and pelvis without any acute findings -She was treated conservatively with IV Protonix initially and switched to oral Protonix by GI on 11/08/2021.  GI signed off on 11/08/2021.  Outpatient follow-up with GI.  She also received few days of oral vitamin K per GI as per family request; subsequently discontinued. -Hemoglobin 8.3 this morning.  Monitor H&H.  Cognitive impairment Physical deconditioning Delirium -Monitor mental status.  Patient is not oriented to time.  Fall precautions.  PT recommended SNF placement.  Social worker following -Patient was very confused and intermittently agitated on 11/08/2021.  Mental status improving this morning and more pleasant. -Patient does not have an official diagnosis of dementia but has had some memory issues as per the son.  Might benefit from  outpatient neurology evaluation.  Acute T1 compression fracture Subacute L3 mild compression fracture -Likely from baseline osteopenia. -Neurosurgery was consulted by ED physician and recommends conservative management.  No intervention or bracing at this time.  Patient reports chronic back pain that is no worse than baseline at this time.  Lactic acidosis -Questionable cause.  Resolved.  Hypokalemia -Improved  Hypertension -Blood pressure on the higher side.  She has been started on amlodipine which will be continued.  Constipation -Continue bowel regimen as per GI    DVT prophylaxis: SCDs Code Status: DNR Family Communication: Son on phone on 11/09/2021  disposition Plan: Status is: Inpatient Remains inpatient appropriate because: Of severity of illness.  Need for SNF placement.  Currently medically stable for discharge   Consultants: GI  Procedures: None  Antimicrobials:  Anti-infectives (From admission, onward)    Start     Dose/Rate Route Frequency Ordered Stop   11/08/21 2200  amoxicillin (AMOXIL) capsule 250 mg        250 mg Oral Every 12 hours 11/08/21 1047 11/10/21 2159   11/06/21 2130  cefTRIAXone (ROCEPHIN) 1 g in sodium chloride 0.9 % 100 mL IVPB  Status:  Discontinued        1 g 200 mL/hr over 30 Minutes Intravenous Every 24 hours 11/06/21 0030 11/08/21 1047   11/05/21 2130  cefTRIAXone (ROCEPHIN) 1 g in sodium chloride 0.9 % 100 mL IVPB        1 g 200 mL/hr over 30 Minutes Intravenous  Once 11/05/21 2129 11/05/21 2213         Subjective: Patient seen and examined at bedside.  Poor historian.  More pleasant this morning.  Patient was more confused and slightly agitated yesterday.  No seizures, vomiting, fever reported.   Objective: Vitals:   11/08/21 0442 11/08/21 1245 11/08/21 1712 11/08/21 2100  BP: (!) 179/61 (!) 176/63 (!) 115/58 (!) 115/56  Pulse: 66 60 69 82  Resp: 18 18 16 18   Temp: 98.5 F (36.9 C) 98.2 F (36.8 C) 98.5 F (36.9 C)  98.6 F (37 C)  TempSrc: Oral Oral Oral Oral  SpO2: 99% 98% 97% 100%  Weight:      Height:        Intake/Output Summary (Last 24 hours) at 11/09/2021 1017 Last data filed at 11/08/2021 1953 Gross per 24 hour  Intake 360 ml  Output 600 ml  Net -240 ml    Filed Weights   11/05/21 2301  Weight: 47.5 kg    Examination:  General: No acute distress.  On room air currently.  Elderly female lying in bed.  Slow to respond, poor historian.  Awake, pleasantly confused this morning.  Currently not agitated. respiratory: Bilateral decreased breath sounds at bases with scattered crackles  CVS: S1 and S2 heard; rate is currently controlled  abdominal: Soft, nontender, mildly distended, no organomegaly; bowel sounds are heard  extremities: No cyanosis; mild lower extremity edema present  Data Reviewed: I have personally reviewed following labs and imaging studies  CBC: Recent Labs  Lab 11/05/21 1629 11/06/21 0452 11/07/21 0515 11/08/21 0436 11/09/21 0418  WBC 7.9 6.3 5.7 6.3 6.3  NEUTROABS  --   --  3.8 3.8 3.9  HGB 9.8* 8.1* 7.9* 8.7* 8.3*  HCT 30.0* 25.2* 24.2* 26.5* 25.2*  MCV 95.8 96.9 96.4 94.3 94.0  PLT 230 159 163 187 [id number]    Basic Metabolic Panel: Recent Labs  Lab 11/05/21 1629 11/06/21 0452 11/07/21 0515 11/08/21 0436 11/09/21 0418  NA 139 142 143 141 142  K 4.2 3.7 4.1 3.4* 3.9  CL 112* 117* 118* 116* 114*  CO2 19* 20* 21* 19* 22  GLUCOSE 104* 95 103* 97 88  BUN 41* 33* 19 22 17   CREATININE 1.03* 0.94 1.00 0.93 0.80  CALCIUM 9.4 8.3* 8.7* 8.9 8.9  MG  --   --  2.0 2.0 1.8    GFR: Estimated Creatinine Clearance: 31.5 mL/min (by C-G formula based on SCr of 0.8 mg/dL). Liver Function Tests: Recent Labs  Lab 11/05/21 1629 11/07/21 0515  AST 42* 47*  ALT 28 23  ALKPHOS 87 70  BILITOT 0.6 0.6  PROT 6.8 5.5*  ALBUMIN 3.5 2.7*    Recent Labs  Lab 11/05/21 1629  LIPASE 77*    No results for input(s): AMMONIA in the last 168 hours. Coagulation  Profile: Recent Labs  Lab 11/07/21 0515  INR 1.1    Cardiac Enzymes: No results for input(s): CKTOTAL, CKMB, CKMBINDEX, TROPONINI in the last 168 hours. BNP (last 3 results) No results for input(s): PROBNP in the last 8760 hours. HbA1C: No results for input(s): HGBA1C in the last 72 hours. CBG: No results for input(s): GLUCAP in the last 168 hours. Lipid Profile: No results for input(s): CHOL, HDL, LDLCALC, TRIG, CHOLHDL, LDLDIRECT in the last 72 hours. Thyroid Function Tests: No results for input(s): TSH, T4TOTAL, FREET4, T3FREE, THYROIDAB in the last 72 hours. Anemia Panel: Recent Labs    11/07/21 0514  VITAMINB12 3,498*  FOLATE 19.9  FERRITIN 28    Sepsis Labs: Recent Labs  Lab 11/05/21 2146 11/05/21 2350  LATICACIDVEN 2.9* 1.8     Recent  Results (from the past 240 hour(s))  Urine Culture     Status: Abnormal   Collection Time: 11/05/21  9:30 PM   Specimen: Urine, Clean Catch  Result Value Ref Range Status   Specimen Description   Final    URINE, CLEAN CATCH Performed at [facility], [place] [address], [place], [place] [address]    Special Requests   Final    NONE Performed at [facility], [place] [address] [place], [place] [address]    Culture >=100,000 COLONIES/mL ESCHERICHIA COLI (A)  Final   Report Status 11/08/2021 FINAL  Final   Organism ID, Bacteria ESCHERICHIA COLI (A)  Final      Susceptibility   Escherichia coli - MIC*    AMPICILLIN <=2 SENSITIVE Sensitive     CEFAZOLIN <=4 SENSITIVE Sensitive     CEFEPIME <=0.12 SENSITIVE Sensitive     CEFTRIAXONE <=0.25 SENSITIVE Sensitive     CIPROFLOXACIN <=0.25 SENSITIVE Sensitive     GENTAMICIN <=1 SENSITIVE Sensitive     IMIPENEM <=0.25 SENSITIVE Sensitive     NITROFURANTOIN <=16 SENSITIVE Sensitive     TRIMETH/SULFA <=20 SENSITIVE Sensitive     AMPICILLIN/SULBACTAM <=2 SENSITIVE Sensitive     PIP/TAZO <=4 SENSITIVE Sensitive     * >=100,000 COLONIES/mL  ESCHERICHIA COLI  Blood culture (routine x 2)     Status: None (Preliminary result)   Collection Time: 11/05/21  9:51 PM   Specimen: BLOOD  Result Value Ref Range Status   Specimen Description   Final    BLOOD BLOOD RIGHT FOREARM Performed at [facility] [address], [place], [place] [address]    Special Requests   Final    BOTTLES DRAWN AEROBIC AND ANAEROBIC Blood Culture results may not be optimal due to an inadequate volume of blood received in culture bottles Performed at [facility] [address], [place], [place] [address]    Culture   Final    NO GROWTH 4 DAYS Performed at [facility], [place] [address], [place], [place] [address]    Report Status PENDING  Incomplete  Blood culture (routine x 2)     Status: None (Preliminary result)   Collection Time: 11/05/21 11:50 PM   Specimen: BLOOD  Result Value Ref Range Status   Specimen Description   Final    BLOOD RIGHT ANTECUBITAL Performed at [facility] [address], [place], [place] [address]    Special Requests   Final    BOTTLES DRAWN AEROBIC ONLY Blood Culture adequate volume Performed at [facility] [address] [place], [place] [address]    Culture   Final    NO GROWTH 3 DAYS Performed at [facility], [place] [address], [place], [place] [address]    Report Status PENDING  Incomplete          Radiology Studies: CT HEAD WO CONTRAST (5MM)  Result Date: 11/08/2021 CLINICAL DATA:  88 year old female with altered mental status. EXAM: CT HEAD WITHOUT CONTRAST TECHNIQUE: Contiguous axial images were obtained from the base of the skull through the vertex without intravenous contrast. RADIATION DOSE REDUCTION: This exam was performed according to the departmental dose-optimization program which includes automated exposure control, adjustment of the mA and/or kV according to patient size and/or use of iterative reconstruction  technique. COMPARISON:  Brain MRI 02/07/2017. Head CT 11/05/2021. FINDINGS: Brain: Stable cerebral volume. Chronic asymmetry of the left lateral ventricle appears stable since 2018.  Chronic encephalomalacia right caudate and/or external capsule. Stable gray-white matter differentiation throughout the brain. No midline shift, ventriculomegaly, mass effect, evidence of mass lesion, intracranial hemorrhage or evidence of cortically based acute infarction. Vascular: Calcified atherosclerosis at the skull base. No suspicious intracranial vascular hyperdensity. Skull: No acute osseous abnormality identified. Sinuses/Orbits: Visualized paranasal sinuses and mastoids are stable and well aerated. Other: No acute orbit or scalp soft tissue finding. IMPRESSION: 1. No acute intracranial abnormality. 2. Stable non contrast CT appearance of the brain recently with chronic small vessel ischemia in the right basal ganglia. Electronically Signed   By: Genevie Ann M.D.   On: 11/08/2021 06:00        Scheduled Meds:  amLODipine  10 mg Oral Daily   amoxicillin  250 mg Oral Q12H   bisacodyl  10 mg Oral QODAY   FLUoxetine  20 mg Oral Daily   folic acid  1 mg Oral Daily   oxybutynin  2.5 mg Oral TID   pantoprazole  40 mg Oral QAC breakfast   polyethylene glycol  17 g Oral BID   zolpidem  5 mg Oral QHS   Continuous Infusions:          Aline August, MD [facility] 11/09/2021, 10:17 AM

## 2021-11-10 DIAGNOSIS — K279 Peptic ulcer, site unspecified, unspecified as acute or chronic, without hemorrhage or perforation: Secondary | ICD-10-CM | POA: Diagnosis not present

## 2021-11-10 DIAGNOSIS — N3020 Other chronic cystitis without hematuria: Secondary | ICD-10-CM | POA: Diagnosis not present

## 2021-11-10 DIAGNOSIS — M1611 Unilateral primary osteoarthritis, right hip: Secondary | ICD-10-CM | POA: Diagnosis not present

## 2021-11-10 DIAGNOSIS — N1831 Chronic kidney disease, stage 3a: Secondary | ICD-10-CM | POA: Diagnosis not present

## 2021-11-10 DIAGNOSIS — E872 Acidosis, unspecified: Secondary | ICD-10-CM | POA: Diagnosis present

## 2021-11-10 DIAGNOSIS — K219 Gastro-esophageal reflux disease without esophagitis: Secondary | ICD-10-CM | POA: Diagnosis present

## 2021-11-10 DIAGNOSIS — Z8711 Personal history of peptic ulcer disease: Secondary | ICD-10-CM | POA: Diagnosis not present

## 2021-11-10 DIAGNOSIS — R1084 Generalized abdominal pain: Secondary | ICD-10-CM

## 2021-11-10 DIAGNOSIS — Z741 Need for assistance with personal care: Secondary | ICD-10-CM | POA: Diagnosis not present

## 2021-11-10 DIAGNOSIS — E559 Vitamin D deficiency, unspecified: Secondary | ICD-10-CM | POA: Diagnosis not present

## 2021-11-10 DIAGNOSIS — M545 Low back pain, unspecified: Secondary | ICD-10-CM | POA: Diagnosis not present

## 2021-11-10 DIAGNOSIS — I959 Hypotension, unspecified: Secondary | ICD-10-CM | POA: Diagnosis not present

## 2021-11-10 DIAGNOSIS — M1990 Unspecified osteoarthritis, unspecified site: Secondary | ICD-10-CM | POA: Diagnosis not present

## 2021-11-10 DIAGNOSIS — N289 Disorder of kidney and ureter, unspecified: Secondary | ICD-10-CM | POA: Diagnosis not present

## 2021-11-10 DIAGNOSIS — E876 Hypokalemia: Secondary | ICD-10-CM | POA: Diagnosis not present

## 2021-11-10 DIAGNOSIS — M25552 Pain in left hip: Secondary | ICD-10-CM | POA: Diagnosis not present

## 2021-11-10 DIAGNOSIS — I129 Hypertensive chronic kidney disease with stage 1 through stage 4 chronic kidney disease, or unspecified chronic kidney disease: Secondary | ICD-10-CM | POA: Diagnosis present

## 2021-11-10 DIAGNOSIS — G8929 Other chronic pain: Secondary | ICD-10-CM | POA: Diagnosis not present

## 2021-11-10 DIAGNOSIS — R079 Chest pain, unspecified: Secondary | ICD-10-CM | POA: Diagnosis not present

## 2021-11-10 DIAGNOSIS — D509 Iron deficiency anemia, unspecified: Secondary | ICD-10-CM | POA: Diagnosis present

## 2021-11-10 DIAGNOSIS — Z9181 History of falling: Secondary | ICD-10-CM | POA: Diagnosis not present

## 2021-11-10 DIAGNOSIS — K567 Ileus, unspecified: Secondary | ICD-10-CM | POA: Diagnosis not present

## 2021-11-10 DIAGNOSIS — H5440 Blindness, one eye, unspecified eye: Secondary | ICD-10-CM | POA: Diagnosis not present

## 2021-11-10 DIAGNOSIS — N183 Chronic kidney disease, stage 3 unspecified: Secondary | ICD-10-CM | POA: Diagnosis present

## 2021-11-10 DIAGNOSIS — K560 Paralytic ileus: Secondary | ICD-10-CM | POA: Diagnosis not present

## 2021-11-10 DIAGNOSIS — N179 Acute kidney failure, unspecified: Secondary | ICD-10-CM | POA: Diagnosis present

## 2021-11-10 DIAGNOSIS — S0990XA Unspecified injury of head, initial encounter: Secondary | ICD-10-CM | POA: Diagnosis not present

## 2021-11-10 DIAGNOSIS — K589 Irritable bowel syndrome without diarrhea: Secondary | ICD-10-CM | POA: Diagnosis not present

## 2021-11-10 DIAGNOSIS — N3281 Overactive bladder: Secondary | ICD-10-CM | POA: Diagnosis not present

## 2021-11-10 DIAGNOSIS — Z043 Encounter for examination and observation following other accident: Secondary | ICD-10-CM | POA: Diagnosis not present

## 2021-11-10 DIAGNOSIS — N189 Chronic kidney disease, unspecified: Secondary | ICD-10-CM | POA: Diagnosis not present

## 2021-11-10 DIAGNOSIS — Z66 Do not resuscitate: Secondary | ICD-10-CM | POA: Diagnosis present

## 2021-11-10 DIAGNOSIS — M4856XA Collapsed vertebra, not elsewhere classified, lumbar region, initial encounter for fracture: Secondary | ICD-10-CM | POA: Diagnosis present

## 2021-11-10 DIAGNOSIS — S41111A Laceration without foreign body of right upper arm, initial encounter: Secondary | ICD-10-CM | POA: Diagnosis not present

## 2021-11-10 DIAGNOSIS — E440 Moderate protein-calorie malnutrition: Secondary | ICD-10-CM | POA: Diagnosis present

## 2021-11-10 DIAGNOSIS — N390 Urinary tract infection, site not specified: Secondary | ICD-10-CM | POA: Diagnosis present

## 2021-11-10 DIAGNOSIS — M6281 Muscle weakness (generalized): Secondary | ICD-10-CM | POA: Diagnosis not present

## 2021-11-10 DIAGNOSIS — R41841 Cognitive communication deficit: Secondary | ICD-10-CM | POA: Diagnosis not present

## 2021-11-10 DIAGNOSIS — G8911 Acute pain due to trauma: Secondary | ICD-10-CM | POA: Diagnosis not present

## 2021-11-10 DIAGNOSIS — Z96642 Presence of left artificial hip joint: Secondary | ICD-10-CM | POA: Diagnosis not present

## 2021-11-10 DIAGNOSIS — S72009A Fracture of unspecified part of neck of unspecified femur, initial encounter for closed fracture: Secondary | ICD-10-CM | POA: Diagnosis present

## 2021-11-10 DIAGNOSIS — Z681 Body mass index (BMI) 19 or less, adult: Secondary | ICD-10-CM | POA: Diagnosis not present

## 2021-11-10 DIAGNOSIS — D649 Anemia, unspecified: Secondary | ICD-10-CM | POA: Diagnosis present

## 2021-11-10 DIAGNOSIS — D62 Acute posthemorrhagic anemia: Secondary | ICD-10-CM | POA: Diagnosis not present

## 2021-11-10 DIAGNOSIS — Z7401 Bed confinement status: Secondary | ICD-10-CM | POA: Diagnosis not present

## 2021-11-10 DIAGNOSIS — R4182 Altered mental status, unspecified: Secondary | ICD-10-CM | POA: Diagnosis not present

## 2021-11-10 DIAGNOSIS — R296 Repeated falls: Secondary | ICD-10-CM | POA: Diagnosis not present

## 2021-11-10 DIAGNOSIS — R4189 Other symptoms and signs involving cognitive functions and awareness: Secondary | ICD-10-CM | POA: Diagnosis not present

## 2021-11-10 DIAGNOSIS — I10 Essential (primary) hypertension: Secondary | ICD-10-CM | POA: Diagnosis not present

## 2021-11-10 DIAGNOSIS — D539 Nutritional anemia, unspecified: Secondary | ICD-10-CM | POA: Diagnosis present

## 2021-11-10 DIAGNOSIS — R0902 Hypoxemia: Secondary | ICD-10-CM | POA: Diagnosis not present

## 2021-11-10 DIAGNOSIS — B9620 Unspecified Escherichia coli [E. coli] as the cause of diseases classified elsewhere: Secondary | ICD-10-CM | POA: Diagnosis present

## 2021-11-10 DIAGNOSIS — R109 Unspecified abdominal pain: Secondary | ICD-10-CM | POA: Diagnosis not present

## 2021-11-10 DIAGNOSIS — G4700 Insomnia, unspecified: Secondary | ICD-10-CM | POA: Diagnosis not present

## 2021-11-10 DIAGNOSIS — Y92129 Unspecified place in nursing home as the place of occurrence of the external cause: Secondary | ICD-10-CM | POA: Diagnosis not present

## 2021-11-10 DIAGNOSIS — K5900 Constipation, unspecified: Secondary | ICD-10-CM | POA: Diagnosis not present

## 2021-11-10 DIAGNOSIS — E785 Hyperlipidemia, unspecified: Secondary | ICD-10-CM | POA: Diagnosis present

## 2021-11-10 DIAGNOSIS — K449 Diaphragmatic hernia without obstruction or gangrene: Secondary | ICD-10-CM | POA: Diagnosis not present

## 2021-11-10 DIAGNOSIS — S72002A Fracture of unspecified part of neck of left femur, initial encounter for closed fracture: Secondary | ICD-10-CM | POA: Diagnosis present

## 2021-11-10 DIAGNOSIS — R9431 Abnormal electrocardiogram [ECG] [EKG]: Secondary | ICD-10-CM | POA: Diagnosis not present

## 2021-11-10 DIAGNOSIS — H5461 Unqualified visual loss, right eye, normal vision left eye: Secondary | ICD-10-CM | POA: Diagnosis present

## 2021-11-10 DIAGNOSIS — Z471 Aftercare following joint replacement surgery: Secondary | ICD-10-CM | POA: Diagnosis not present

## 2021-11-10 DIAGNOSIS — Y9301 Activity, walking, marching and hiking: Secondary | ICD-10-CM | POA: Diagnosis not present

## 2021-11-10 DIAGNOSIS — K5904 Chronic idiopathic constipation: Secondary | ICD-10-CM | POA: Diagnosis present

## 2021-11-10 DIAGNOSIS — Z961 Presence of intraocular lens: Secondary | ICD-10-CM | POA: Diagnosis present

## 2021-11-10 DIAGNOSIS — S22010D Wedge compression fracture of first thoracic vertebra, subsequent encounter for fracture with routine healing: Secondary | ICD-10-CM | POA: Diagnosis not present

## 2021-11-10 DIAGNOSIS — F32A Depression, unspecified: Secondary | ICD-10-CM | POA: Diagnosis present

## 2021-11-10 DIAGNOSIS — R609 Edema, unspecified: Secondary | ICD-10-CM | POA: Diagnosis not present

## 2021-11-10 DIAGNOSIS — R2681 Unsteadiness on feet: Secondary | ICD-10-CM | POA: Diagnosis not present

## 2021-11-10 DIAGNOSIS — F39 Unspecified mood [affective] disorder: Secondary | ICD-10-CM | POA: Diagnosis present

## 2021-11-10 DIAGNOSIS — Z87440 Personal history of urinary (tract) infections: Secondary | ICD-10-CM | POA: Diagnosis not present

## 2021-11-10 DIAGNOSIS — R5381 Other malaise: Secondary | ICD-10-CM | POA: Diagnosis not present

## 2021-11-10 DIAGNOSIS — Z79899 Other long term (current) drug therapy: Secondary | ICD-10-CM | POA: Diagnosis not present

## 2021-11-10 DIAGNOSIS — W06XXXA Fall from bed, initial encounter: Secondary | ICD-10-CM | POA: Diagnosis present

## 2021-11-10 DIAGNOSIS — S32030D Wedge compression fracture of third lumbar vertebra, subsequent encounter for fracture with routine healing: Secondary | ICD-10-CM | POA: Diagnosis not present

## 2021-11-10 DIAGNOSIS — W19XXXA Unspecified fall, initial encounter: Secondary | ICD-10-CM | POA: Diagnosis not present

## 2021-11-10 LAB — BASIC METABOLIC PANEL
Anion gap: 5 (ref 5–15)
BUN: 15 mg/dL (ref 8–23)
CO2: 22 mmol/L (ref 22–32)
Calcium: 9.2 mg/dL (ref 8.9–10.3)
Chloride: 114 mmol/L — ABNORMAL HIGH (ref 98–111)
Creatinine, Ser: 0.78 mg/dL (ref 0.44–1.00)
GFR, Estimated: >60 mL/min (ref >60)
Glucose, Bld: 92 mg/dL (ref 70–99)
Potassium: 3.9 mmol/L (ref 3.5–5.1)
Sodium: 141 mmol/L (ref 135–145)

## 2021-11-10 LAB — CULTURE, BLOOD (ROUTINE X 2): Culture: NO GROWTH

## 2021-11-10 LAB — CBC WITH DIFFERENTIAL/PLATELET
Abs Immature Granulocytes: 0.06 10*3/uL (ref 0.00–0.07)
Basophils Absolute: 0 10*3/uL (ref 0.0–0.1)
Basophils Relative: 1 %
Eosinophils Absolute: 0.3 10*3/uL (ref 0.0–0.5)
Eosinophils Relative: 4 %
HCT: 26.8 % — ABNORMAL LOW (ref 36.0–46.0)
Hemoglobin: 8.9 g/dL — ABNORMAL LOW (ref 12.0–15.0)
Immature Granulocytes: 1 %
Lymphocytes Relative: 18 %
Lymphs Abs: 1.1 10*3/uL (ref 0.7–4.0)
MCH: 31.6 pg (ref 26.0–34.0)
MCHC: 33.2 g/dL (ref 30.0–36.0)
MCV: 95 fL (ref 80.0–100.0)
Monocytes Absolute: 0.8 10*3/uL (ref 0.1–1.0)
Monocytes Relative: 14 %
Neutro Abs: 3.5 10*3/uL (ref 1.7–7.7)
Neutrophils Relative %: 62 %
Platelets: 223 10*3/uL (ref 150–400)
RBC: 2.82 MIL/uL — ABNORMAL LOW (ref 3.87–5.11)
RDW: 15.2 % (ref 11.5–15.5)
WBC: 5.7 10*3/uL (ref 4.0–10.5)
nRBC: 0.3 % — ABNORMAL HIGH (ref 0.0–0.2)

## 2021-11-10 MED ORDER — HALOPERIDOL LACTATE 5 MG/ML IJ SOLN
0.5000 mg | Freq: Once | INTRAMUSCULAR | Status: AC
  Administered 2021-11-10: 0.5 mg via INTRAMUSCULAR
  Filled 2021-11-10: qty 1

## 2021-11-10 MED ORDER — PANTOPRAZOLE SODIUM 40 MG PO TBEC: 40.0000 mg | DELAYED_RELEASE_TABLET | Freq: Every day | ORAL | Status: DC

## 2021-11-10 MED ORDER — AMLODIPINE BESYLATE 10 MG PO TABS: 10.0000 mg | ORAL_TABLET | Freq: Every day | ORAL | Status: DC

## 2021-11-10 MED ORDER — OXYBUTYNIN CHLORIDE 2.5 MG PO TABS: 2.5000 mg | ORAL_TABLET | Freq: Three times a day (TID) | ORAL | Status: DC

## 2021-11-10 NOTE — Progress Notes (Signed)
Removed IV and external catheter, report called to Cala Bradford at [facility], [place] to transport to facility, personal belongings with patient

## 2021-11-10 NOTE — TOC Progression Note (Signed)
Transition of Care [facility]) - Progression Note    Patient Details  Name: Evelyn [name] MRN: [id number] Date of Birth: 20-Dec-1924  Transition [facility] Care [facility]) CM/SW Contact  Kalyse Meharg, Olegario Messier, RN Phone Number: 11/10/2021, 10:34 AM  Clinical Narrative: Dtr Misty [facility] has bed available. Await d/c summary,rn#,tel# report prior PTAR.     Expected Discharge Plan: Skilled Nursing Facility Barriers to Discharge: Continued Medical Work up  Expected Discharge Plan and Services Expected Discharge Plan: Skilled Nursing Facility   Discharge Planning Services: CM Consult   Living arrangements for the past 2 months: Single Family Home                                       Social Determinants of Health (SDOH) Interventions    Readmission Risk Interventions     View : No data to display.

## 2021-11-10 NOTE — TOC Transition Note (Signed)
Transition of Care [facility]) - CM/SW Discharge Note   Patient Details  Name: Evelyn [name] MRN: [id number] Date of Birth: 07-07-1924  Transition [facility]) CM/SW Contact:  Lanier Clam, RN Phone Number: 11/10/2021, 12:53 PM   Clinical Narrative:  [facility] aware, has a bed rm#[id number],nsg call report tel#[phone]. PTAR called. No further CM needs.     Final next level of care: Skilled Nursing Facility Barriers to Discharge: No Barriers Identified   Patient Goals and CMS Choice Patient states their goals for this hospitalization and ongoing recovery are:: Rehab CMS Medicare.gov Compare Post Acute Care list provided to:: Patient    Discharge Placement                       Discharge Plan and Services   Discharge Planning Services: CM Consult                                 Social Determinants of Health (SDOH) Interventions     Readmission Risk Interventions     View : No data to display.

## 2021-11-10 NOTE — Discharge Summary (Signed)
Physician Discharge Summary  Evelyn [name] [id number] DOB: [birth year] DOA: 11/05/2021  PCP: Wardell Honour, MD  Admit date: 11/05/2021 Discharge date: 11/10/2021  Admitted From: home Disposition:  SNF  Recommendations for Outpatient Follow-up:  Follow up with PCP in 1-2 weeks  Home Health: none Equipment/Devices: none  Discharge Condition: stable CODE STATUS: DNR Diet Orders (From admission, onward)     Start     Ordered   11/06/21 1518  Diet regular Room service appropriate? Yes; Fluid consistency: Thin  Diet effective now       Question Answer Comment  Room service appropriate? Yes   Fluid consistency: Thin      11/06/21 1517            HPI: Per admitting MD, Evelyn [name] is a 88 y.o. female with medical history significant of cognitive impairment, CKD stage IIIa, hyperlipidemia who presents for concerns of a fall, diarrhea and general malaise. Patient lives alone and normally is very independent.  She normally can ambulate without assistance but yesterday she she believes she was getting out of bed and walking into the living room and found herself on the floor.  She was unclear as to how she fell but she was able to get up and go back to bed.  This morning she awoke and had general malaise and severe bilateral lower abdominal pain prompting her to present to the ED.  She also has been having diarrhea for the past 4 days.  No new medication changes or antibiotics.  Denies any hematochezia or melena.  No nausea or vomiting.  No fever. In the ED, she was afebrile mildly hypertensive with SBP of 170.  UA was positive for large leukocyte and nitrite with many bacteria. No leukocytosis but hemoglobin has notably downward trended to 9.8 from 13.5 just 4 months ago.  She denies any NSAID use.  However she does have history of gastritis with ulcer in the past.  CT abdomen/pelvis was initially obtained with no acute findings.  Due to severe pain while in the ED she further  underwent a CTA angio of the abdomen and pelvis which showed no acute abdominal or pelvic findings. CT head was negative for acute findings.  CT cervical spine showed acute compression fracture of the T1 vertebral body.  ED physician discussed with neurosurgery and no intervention or brace was advised. Recommend conservative treatment.   Hospital Course / Discharge diagnoses: Active Problems:   UTI (urinary tract infection)   Acute anemia   Compression fracture of body of thoracic vertebra (HCC)   Cognitive impairment   Lactic acidosis   Black stools   Heme + stool   Peptic ulcer disease   HTN (hypertension)   Hypokalemia   Delirium  Principal problem UTI - Present on admission. Urine culture growing pansensitive E. coli. Rocephin switched to amoxicillin.  Completed a 5-day course of therapy.  Active problems Acute anemia -Hemoglobin 9.8 from 13.5 4 months ago.  No obvious hematochezia or melena.  History of gastric ulcer in 2019. CTA abdomen and pelvis without any acute findings. She was treated conservatively with IV Protonix initially and switched to oral Protonix by GI on 11/08/2021.  GI signed off on 11/08/2021.  Outpatient follow-up with GI.  She also received few days of oral vitamin K per GI as per family request; subsequently discontinued. Hb stable, improving on its own. Cognitive impairment, physical deconditioning, delirium -Monitor mental status.  Patient is not oriented to time.  Fall precautions.  PT recommended SNF placement.  Social worker following Acute T1 compression fracture, Subacute L3 mild compression fracture -Likely from baseline osteopenia. Neurosurgery was consulted by ED physician and recommends conservative management.  No intervention or bracing at this time.  Patient reports chronic back pain that is no worse than baseline at this time. Lactic acidosis -Questionable cause.  Resolved. Hypokalemia -Improved Hypertension -Blood pressure on the higher side.  She has  been started on amlodipine which will be continued. Constipation -Continue bowel regimen as per GI  Sepsis ruled out   Discharge Instructions   Allergies as of 11/10/2021   No Known Allergies      Medication List     TAKE these medications    amLODipine 10 MG tablet Commonly known as: NORVASC Take 1 tablet (10 mg total) by mouth daily.   cholecalciferol 25 MCG (1000 UNIT) tablet Commonly known as: VITAMIN D3 Take 1,000 Units by mouth daily.   FLUoxetine 20 MG capsule Commonly known as: PROZAC Take 20 mg by mouth daily.   Oxybutynin Chloride 2.5 MG Tabs Take 2.5 mg by mouth 3 (three) times daily.   pantoprazole 40 MG tablet Commonly known as: PROTONIX Take 1 tablet (40 mg total) by mouth daily before breakfast.   polyethylene glycol 17 g packet Commonly known as: MIRALAX / GLYCOLAX Take 17 g by mouth as needed for moderate constipation.   zolpidem 10 MG tablet Commonly known as: AMBIEN Take 1/2 tablet by mouth once daily at bedtime as needed for sleep. What changed:  how much to take how to take this when to take this additional instructions        Contact information for follow-up providers     Milus Banister, MD Follow up on 12/30/2021.   Specialty: Gastroenterology Why: 9:10 am Contact information: [address] [place] [address] [phone]              Contact information for after-discharge care     Destination     [facility] .   Service: Skilled Nursing Contact information: [address] [place] [phone]                     Consultations: GI  Procedures/Studies:  CT HEAD WO CONTRAST (5MM)  Result Date: 11/08/2021 CLINICAL DATA:  88 year old female with altered mental status. EXAM: CT HEAD WITHOUT CONTRAST TECHNIQUE: Contiguous axial images were obtained from the base of the skull through the vertex without intravenous  contrast. RADIATION DOSE REDUCTION: This exam was performed according to the departmental dose-optimization program which includes automated exposure control, adjustment of the mA and/or kV according to patient size and/or use of iterative reconstruction technique. COMPARISON:  Brain MRI 02/07/2017. Head CT 11/05/2021. FINDINGS: Brain: Stable cerebral volume. Chronic asymmetry of the left lateral ventricle appears stable since 2018. Chronic encephalomalacia right caudate and/or external capsule. Stable gray-white matter differentiation throughout the brain. No midline shift, ventriculomegaly, mass effect, evidence of mass lesion, intracranial hemorrhage or evidence of cortically based acute infarction. Vascular: Calcified atherosclerosis at the skull base. No suspicious intracranial vascular hyperdensity. Skull: No acute osseous abnormality identified. Sinuses/Orbits: Visualized paranasal sinuses and mastoids are stable and well aerated. Other: No acute orbit or scalp soft tissue finding. IMPRESSION: 1. No acute intracranial abnormality. 2. Stable non contrast CT appearance of the brain recently with chronic small vessel ischemia in the right basal ganglia. Electronically Signed   By: Herminio Heads.D.  On: 11/08/2021 06:00   CT Head Wo Contrast  Result Date: 11/05/2021 CLINICAL DATA:  Unwitnessed fall. EXAM: CT HEAD WITHOUT CONTRAST TECHNIQUE: Contiguous axial images were obtained from the base of the skull through the vertex without intravenous contrast. RADIATION DOSE REDUCTION: This exam was performed according to the departmental dose-optimization program which includes automated exposure control, adjustment of the mA and/or kV according to patient size and/or use of iterative reconstruction technique. COMPARISON:  MRI February 07, 2017 FINDINGS: Brain: No evidence of acute infarction, hemorrhage, hydrocephalus, extra-axial collection or mass lesion/mass effect. Mild age-related global parenchymal volume loss.  Similar asymmetry of the lateral ventricles. Mild burden of chronic ischemic white matter disease. Vascular: No hyperdense vessel. Atherosclerosis of the internal carotid and vertebral arteries. Skull: Normal. Negative for fracture or focal lesion. Sinuses/Orbits: Visualized portions of the paranasal sinuses are predominantly clear. Prior lens surgery. Other: Mastoid air cells are predominantly clear. Probable dense cerumen in the bilateral external auditory canals. IMPRESSION: No acute intracranial abnormality. Electronically Signed   By: Dahlia Bailiff M.D.   On: 11/05/2021 16:30   CT Cervical Spine Wo Contrast  Result Date: 11/05/2021 CLINICAL DATA:  Neck trauma. Fell. Contemporaneous head CT negative for acute abnormality. EXAM: CT CERVICAL SPINE WITHOUT CONTRAST TECHNIQUE: Multidetector CT imaging of the cervical spine was performed without intravenous contrast. Multiplanar CT image reconstructions were also generated. RADIATION DOSE REDUCTION: This exam was performed according to the departmental dose-optimization program which includes automated exposure control, adjustment of the mA and/or kV according to patient size and/or use of iterative reconstruction technique. COMPARISON:  None Available. FINDINGS: Alignment: There is a minimal 1-2 mm anterolisthesis at C2-3, C3-4, C4-5 and C7-T1, 2 mm anterolisthesis T1-2, all most likely degenerative etiology. No traumatic listhesis is suspected. There is narrowing and osteophyte formation at the anterior atlantodental joint. Skull base and vertebrae: Osteopenia. The skull base, mastoid air cells and middle ears are unremarkable. There is mild acute upper plate anterior wedge compression fracture of the T1 vertebral body with compaction of trabecular elements in the superior vertebral body, slight posterosuperior cortical retropulsion, and nondisplaced fragmentation along the anterior superior vertebral body. Loss of anterior height at T1 is no more than 20%,  posterior height loss no more than 10%. No cervical fracture is evident and no primary bone lesion is suspected. Soft tissues and spinal canal: There is mild soft tissue reaction in the anterior prevertebral plane alongside T1 but no space-occupying hematoma. There is no further prevertebral swelling or fluid. No visible canal hematoma. There are moderate calcifications in both carotid bulbs. Disc levels: No herniated discs or cord compromise are noted at any level including T1. There is multilevel facet joint and uncinate spurring. There is partial disc space loss C3-4, C5-6 and C6-7 and slight disc narrowing C4-5. The other discs are normal in heights. There are small posterior osteophytes at C3-4 without stenosis, small bidirectional osteophytes at C5-6 and C6-7 also without stenosis. No cervical levels demonstrate critical foraminal stenosis. There is mild foraminal stenosis on the right at C5-6. Upper chest: Lung apices demonstrate interstitial reticulation which is most likely due to chronic interstitial disease, less likely could indicate edema in the proper clinical setting. Other: There is asymmetric joint space loss and spurring of the left TMJ. Incidental note is made of bilateral mandibular tori. There is advanced carious decay of the right maxillary 6 year molar. Follow-up with a dentist is recommended. IMPRESSION: 1. Acute mild upper plate anterior wedge compression fracture T1 vertebral body. 2.  Slight posterosuperior cortical retropulsion with 20% anterior and 10% posterior height loss, with only mild prevertebral soft tissue reaction and no intracanalicular or paraspinal hematoma. 3. Osteopenia and degenerative change without evidence of cervical fractures. 4. Interstitial reticulation in the lung apices which is most likely chronic, less likely could be interstitial edema. 5. Carotid atherosclerosis. 6. Severe decay of the right maxillary 6 year molar. Follow-up with a dentist is recommended. 7.  Asymmetric left TMJ DJD. 8. Discussed over the phone with Dr. Wyvonnia Dusky at 9:04 p.m., 11/05/2021 with acknowledgement of findings. Electronically Signed   By: Telford Nab M.D.   On: 11/05/2021 21:05   CT ABDOMEN PELVIS W CONTRAST  Result Date: 11/05/2021 CLINICAL DATA:  Acute abdominal pain EXAM: CT ABDOMEN AND PELVIS WITH CONTRAST TECHNIQUE: Multidetector CT imaging of the abdomen and pelvis was performed using the standard protocol following bolus administration of intravenous contrast. RADIATION DOSE REDUCTION: This exam was performed according to the departmental dose-optimization program which includes automated exposure control, adjustment of the mA and/or kV according to patient size and/or use of iterative reconstruction technique. CONTRAST:  65mL OMNIPAQUE IOHEXOL 300 MG/ML  SOLN COMPARISON:  12/08/2007 FINDINGS: Motion degraded images. Lower chest: Lung bases are clear. Hepatobiliary: 11 mm cyst in the central right liver (series 11/image 5). Calcified hepatic granuloma. Liver is otherwise within normal limits. Gallbladder is unremarkable. No intrahepatic or extrahepatic ductal dilatation. Pancreas: Within normal limits. Spleen: Calcified splenic granuloma. Adrenals/Urinary Tract: Adrenal glands are within normal limits. Bilateral renal cysts, measuring up to 4.7 cm in the left upper kidney (series 11/image 11). No hydronephrosis. Bladder is within normal limits. Stomach/Bowel: Stomach is notable for a moderate hiatal hernia, incompletely visualized. No evidence of bowel obstruction. Appendix is poorly visualized but grossly unremarkable (series 2/image 52). No colonic wall thickening or inflammatory changes. Vascular/Lymphatic: No evidence of abdominal aortic aneurysm. Atherosclerotic calcifications of the abdominal aorta and branch vessels. No suspicious abdominopelvic lymphadenopathy. Reproductive: Status post hysterectomy. No adnexal masses. Other: No abdominopelvic ascites. Musculoskeletal: Mild  superior endplate compression fracture deformity at T12 and L3, age indeterminate but likely chronic. No retropulsion. IMPRESSION: No CT findings to account for the patient's acute abdominal pain. Additional ancillary findings as above. Electronically Signed   By: Julian Hy M.D.   On: 11/05/2021 20:53   CT Angio Abd/Pel W and/or Wo Contrast  Result Date: 11/05/2021 CLINICAL DATA:  Suspected mesenteric ischemia. EXAM: CTA ABDOMEN AND PELVIS WITHOUT AND WITH CONTRAST TECHNIQUE: Multidetector CT imaging of the abdomen and pelvis was performed using the standard protocol during bolus administration of intravenous contrast. Multiplanar reconstructed images and MIPs were obtained and reviewed to evaluate the vascular anatomy. RADIATION DOSE REDUCTION: This exam was performed according to the departmental dose-optimization program which includes automated exposure control, adjustment of the mA and/or kV according to patient size and/or use of iterative reconstruction technique. CONTRAST:  17mL OMNIPAQUE IOHEXOL 300 MG/ML  SOLN COMPARISON:  CT abdomen and pelvis with IV contrast earlier today, CT abdomen and pelvis with IV and oral contrast 12/08/2007. FINDINGS: VASCULAR Aorta: Abdominal aorta is tortuous with heavy mixed plaque. Some of the soft plaque shows mild ulcerative features but there is no penetrating ulcer, aneurysm, dissection or critical stenosis. Celiac: There are ostial calcifications causing a 50% calcific origin stenosis of this vessel. The remainder of the vessel and its branch arteries are patent. SMA: There are minimal scattered mixed plaques but no flow-limiting stenosis, aneurysm or dissection. Renals: There are bilateral single renal arteries. There are calcifications in  the proximal right renal artery causing up to 60% stenosis in the proximal 1 cm of the vessel. There are nonstenosing ostial calcifications in the left renal artery origin. Both renal arteries are otherwise clear without  aneurysm or dissection. IMA: There is a high-grade mixed plaque origin stenosis of this vessel. The IMA otherwise shows normal opacification. Inflow: There patchy moderate calcifications in the common iliac and internal iliac arteries. There is no more than 30% stenosis in the left common iliac artery proximally, no significant narrowing of the right common iliac. There is no flow-limiting stenosis despite patchy calcifications in either internal iliac artery. There is mild scattered nonstenosing plaque in the internal iliac arteries. Proximal Outflow: There nonstenosing calcifications in both common iliac arteries. The visualized deep and circumflex femoral arteries are patent. Proximal left superficial femoral artery is patent. There is a focal stenosis in the proximal right superficial femoral artery up to 50% due to vessel thrombus, otherwise the visualized right SFA is patent. Veins: Patent portal vein, IVC and deep pelvic veins. Review of the MIP images confirms the above findings. NON-VASCULAR Lower chest: There are COPD and scarring changes in the lung bases, elevated right hemidiaphragm large hiatal hernia stomach majority intrathoracic. There are coronary artery calcifications mild cardiomegaly. Hepatobiliary: 1.3 cm cyst in the dome of the right lobe of the liver is noted, previously 1 cm in 2009. There is a calcified granuloma in the anterior segment of the right lobe. The liver slightly steatotic and otherwise unremarkable. Pancreas: No focal abnormality. Spleen: Single calcified granuloma. No mass enhancement. No splenomegaly. Adrenals/Urinary Tract: No adrenal mass. No renal mass enhancement with multiple bilateral cysts described previously. There is no hydronephrosis. No bladder thickening. Dense contrast opacifies the bladder from today's earlier abdomen pelvis CT. Stomach/Bowel: No wall thickening or dilatation including the appendix. There are colonic diverticula without inflammatory change.  Lymphatic: No adenopathy.  Pelvic phleboliths. Reproductive: Status post hysterectomy.  No adnexal mass. Other: Small umbilical fat hernia.  No free air, hemorrhage or fluid Musculoskeletal: Osteopenia. Mild-to-moderate chronic compression fracture T12 vertebral body is unchanged since 2009. Mild generalized upper plate compression fracture of L3 was not seen in 2009 or the lumbar MRI from 12/03/2017, age indeterminate but no adjacent hematoma. There is S shaped lower thoracic/lumbar scoliosis, degenerative changes. IMPRESSION: VASCULAR 1. Aortic and branch vessel atherosclerosis described above. No dissection or aneurysm. Some of the aortic soft plaque shows mild ulcerative features but there is no penetrating ulcer. 2. There is a high-grade IMA origin stenosis, but there is no flow-limiting stenosis in the SMA and the remainder of the IMA opacifies normally. 3. There is a 50% calcific origin stenosis in the celiac artery, which is otherwise clear, and a 60% calcific stenosis in the proximal right renal artery, with the renal arteries otherwise without stenosis. 4. 50% focal soft plaque stenosis in the proximal right superficial femoral artery. No other appreciable significant inflow or proximal outflow stenosis with calcific changes described above. 5. Coronary artery atherosclerosis.  Mild cardiomegaly. NON-VASCULAR 1. Age indeterminate mild upper plate generalized compression fracture deformity of L3 vertebral body, is probably subacute given the lack of adjacent soft tissue reaction, most recent lumbar imaging is MRI 12/03/2017 and this was not present at that time. 2. No acute abdominal or pelvic findings. 3. Large hiatal hernia and additional chronic findings discussed above. Electronically Signed   By: Telford Nab M.D.   On: 11/05/2021 23:06     Subjective: - no chest pain, shortness of breath,  no abdominal pain, nausea or vomiting.   Discharge Exam: BP (!) 149/58 (BP Location: Left Arm)   Pulse  72   Temp 98.6 F (37 C) (Oral)   Resp 18   Ht 5\' 5"  (1.651 m)   Wt 47.5 kg   SpO2 99%   BMI 17.43 kg/m   General: Pt is alert, awake, not in acute distress Cardiovascular: RRR, S1/S2 +, no rubs, no gallops Respiratory: CTA bilaterally, no wheezing, no rhonchi Abdominal: Soft, NT, ND, bowel sounds + Extremities: no edema, no cyanosis   The results of significant diagnostics from this hospitalization (including imaging, microbiology, ancillary and laboratory) are listed below for reference.     Microbiology: Recent Results (from the past 240 hour(s))  Urine Culture     Status: Abnormal   Collection Time: 11/05/21  9:30 PM   Specimen: Urine, Clean Catch  Result Value Ref Range Status   Specimen Description   Final    URINE, CLEAN CATCH Performed at [facility], [place] [address] [place], [place] [address]    Special Requests   Final    NONE Performed at [facility], [place] [address], [place], [place] [address]    Culture >=100,000 COLONIES/mL ESCHERICHIA COLI (A)  Final   Report Status 11/08/2021 FINAL  Final   Organism ID, Bacteria ESCHERICHIA COLI (A)  Final      Susceptibility   Escherichia coli - MIC*    AMPICILLIN <=2 SENSITIVE Sensitive     CEFAZOLIN <=4 SENSITIVE Sensitive     CEFEPIME <=0.12 SENSITIVE Sensitive     CEFTRIAXONE <=0.25 SENSITIVE Sensitive     CIPROFLOXACIN <=0.25 SENSITIVE Sensitive     GENTAMICIN <=1 SENSITIVE Sensitive     IMIPENEM <=0.25 SENSITIVE Sensitive     NITROFURANTOIN <=16 SENSITIVE Sensitive     TRIMETH/SULFA <=20 SENSITIVE Sensitive     AMPICILLIN/SULBACTAM <=2 SENSITIVE Sensitive     PIP/TAZO <=4 SENSITIVE Sensitive     * >=100,000 COLONIES/mL ESCHERICHIA COLI  Blood culture (routine x 2)     Status: None   Collection Time: 11/05/21  9:51 PM   Specimen: BLOOD  Result Value Ref Range Status   Specimen Description   Final    BLOOD BLOOD RIGHT FOREARM Performed at [facility] [address], [place], [place] [address]    Special Requests   Final    BOTTLES DRAWN AEROBIC AND ANAEROBIC Blood Culture results may not be optimal due to an inadequate volume of blood received in culture bottles Performed at [place] [address] [place], [place] [address]    Culture   Final    NO GROWTH 5 DAYS Performed at [facility], [place] [address] [place], [place] [address]    Report Status 11/10/2021 FINAL  Final  Blood culture (routine x 2)     Status: None (Preliminary result)   Collection Time: 11/05/21 11:50 PM   Specimen: BLOOD  Result Value Ref Range Status   Specimen Description   Final    BLOOD RIGHT ANTECUBITAL Performed at [facility] [address] [place], [place] [address]    Special Requests   Final    BOTTLES DRAWN AEROBIC ONLY Blood Culture adequate volume Performed at [facility] [address], [place], [place] [address]    Culture   Final    NO GROWTH 4 DAYS Performed at [facility], [place] [address] [place],  [address]  Report Status PENDING  Incomplete     Labs: Basic Metabolic Panel: Recent Labs  Lab 11/06/21 0452 11/07/21 0515 11/08/21 0436 11/09/21 0418 11/10/21 0729  NA 142 143 141 142 141  K 3.7 4.1 3.4* 3.9 3.9  CL 117* 118* 116* 114* 114*  CO2 20* 21* 19* 22 22  GLUCOSE 95 103* 97 88 92  BUN 33* 19 22 17 15   CREATININE 0.94 1.00 0.93 0.80 0.78  CALCIUM 8.3* 8.7* 8.9 8.9 9.2  MG  --  2.0 2.0 1.8  --    Liver Function Tests: Recent Labs  Lab 11/05/21 1629 11/07/21 0515  AST 42* 47*  ALT 28 23  ALKPHOS 87 70  BILITOT 0.6 0.6  PROT 6.8 5.5*  ALBUMIN 3.5 2.7*   CBC: Recent Labs  Lab 11/06/21 0452 11/07/21 0515 11/08/21 0436 11/09/21 0418 11/10/21 0729  WBC 6.3 5.7 6.3 6.3 5.7  NEUTROABS  --  3.8 3.8 3.9 3.5  HGB 8.1* 7.9* 8.7* 8.3* 8.9*  HCT 25.2* 24.2* 26.5* 25.2* 26.8*  MCV 96.9 96.4 94.3 94.0 95.0  PLT 159  163 187 215 223   CBG: No results for input(s): GLUCAP in the last 168 hours. Hgb A1c No results for input(s): HGBA1C in the last 72 hours. Lipid Profile No results for input(s): CHOL, HDL, LDLCALC, TRIG, CHOLHDL, LDLDIRECT in the last 72 hours. Thyroid function studies No results for input(s): TSH, T4TOTAL, T3FREE, THYROIDAB in the last 72 hours.  Invalid input(s): FREET3 Urinalysis    Component Value Date/Time   COLORURINE YELLOW 11/05/2021 2101   APPEARANCEUR HAZY (A) 11/05/2021 2101   LABSPEC 1.012 11/05/2021 2101   PHURINE 6.0 11/05/2021 2101   GLUCOSEU NEGATIVE 11/05/2021 2101   GLUCOSEU NEGATIVE 02/01/2017 1212   HGBUR NEGATIVE 11/05/2021 2101   BILIRUBINUR NEGATIVE 11/05/2021 2101   KETONESUR NEGATIVE 11/05/2021 2101   PROTEINUR NEGATIVE 11/05/2021 2101   UROBILINOGEN 1.0 02/01/2017 1212   NITRITE POSITIVE (A) 11/05/2021 2101   LEUKOCYTESUR LARGE (A) 11/05/2021 2101    FURTHER DISCHARGE INSTRUCTIONS:   Get Medicines reviewed and adjusted: Please take all your medications with you for your next visit with your Primary MD   Laboratory/radiological data: Please request your Primary MD to go over all hospital tests and procedure/radiological results at the follow up, please ask your Primary MD to get all Hospital records sent to his/her office.   In some cases, they will be blood work, cultures and biopsy results pending at the time of your discharge. Please request that your primary care M.D. goes through all the records of your hospital data and follows up on these results.   Also Note the following: If you experience worsening of your admission symptoms, develop shortness of breath, life threatening emergency, suicidal or homicidal thoughts you must seek medical attention immediately by calling 911 or calling your MD immediately  if symptoms less severe.   You must read complete instructions/literature along with all the possible adverse reactions/side effects for  all the Medicines you take and that have been prescribed to you. Take any new Medicines after you have completely understood and accpet all the possible adverse reactions/side effects.    Do not drive when taking Pain medications or sleeping medications (Benzodaizepines)   Do not take more than prescribed Pain, Sleep and Anxiety Medications. It is not advisable to combine anxiety,sleep and pain medications without talking with your primary care practitioner   Special Instructions: If you have smoked or chewed Tobacco  in the last 2  yrs please stop smoking, stop any regular Alcohol  and or any Recreational drug use.   Wear Seat belts while driving.   Please note: You were cared for by a hospitalist during your hospital stay. Once you are discharged, your primary care physician will handle any further medical issues. Please note that NO REFILLS for any discharge medications will be authorized once you are discharged, as it is imperative that you return to your primary care physician (or establish a relationship with a primary care physician if you do not have one) for your post hospital discharge needs so that they can reassess your need for medications and monitor your lab values.  Time coordinating discharge: 40 minutes  SIGNED:  Marzetta Board, MD, PhD 11/10/2021, 10:49 AM

## 2021-11-10 NOTE — Plan of Care (Signed)

## 2021-11-11 DIAGNOSIS — E876 Hypokalemia: Secondary | ICD-10-CM | POA: Diagnosis not present

## 2021-11-11 DIAGNOSIS — D649 Anemia, unspecified: Secondary | ICD-10-CM | POA: Diagnosis not present

## 2021-11-11 DIAGNOSIS — E559 Vitamin D deficiency, unspecified: Secondary | ICD-10-CM | POA: Diagnosis not present

## 2021-11-11 DIAGNOSIS — F32A Depression, unspecified: Secondary | ICD-10-CM | POA: Diagnosis not present

## 2021-11-11 DIAGNOSIS — N189 Chronic kidney disease, unspecified: Secondary | ICD-10-CM | POA: Diagnosis not present

## 2021-11-11 DIAGNOSIS — R5381 Other malaise: Secondary | ICD-10-CM | POA: Diagnosis not present

## 2021-11-11 DIAGNOSIS — N3281 Overactive bladder: Secondary | ICD-10-CM | POA: Diagnosis not present

## 2021-11-11 DIAGNOSIS — K5900 Constipation, unspecified: Secondary | ICD-10-CM | POA: Diagnosis not present

## 2021-11-11 DIAGNOSIS — S22010D Wedge compression fracture of first thoracic vertebra, subsequent encounter for fracture with routine healing: Secondary | ICD-10-CM | POA: Diagnosis not present

## 2021-11-11 DIAGNOSIS — R4189 Other symptoms and signs involving cognitive functions and awareness: Secondary | ICD-10-CM | POA: Diagnosis not present

## 2021-11-11 DIAGNOSIS — G4700 Insomnia, unspecified: Secondary | ICD-10-CM | POA: Diagnosis not present

## 2021-11-11 DIAGNOSIS — N390 Urinary tract infection, site not specified: Secondary | ICD-10-CM | POA: Diagnosis not present

## 2021-11-11 LAB — CULTURE, BLOOD (ROUTINE X 2)
Culture: NO GROWTH
Special Requests: ADEQUATE

## 2021-11-12 ENCOUNTER — Telehealth: Payer: Self-pay

## 2021-11-12 DIAGNOSIS — R4189 Other symptoms and signs involving cognitive functions and awareness: Secondary | ICD-10-CM | POA: Diagnosis not present

## 2021-11-12 DIAGNOSIS — E559 Vitamin D deficiency, unspecified: Secondary | ICD-10-CM | POA: Diagnosis not present

## 2021-11-12 DIAGNOSIS — N390 Urinary tract infection, site not specified: Secondary | ICD-10-CM | POA: Diagnosis not present

## 2021-11-12 DIAGNOSIS — S22010D Wedge compression fracture of first thoracic vertebra, subsequent encounter for fracture with routine healing: Secondary | ICD-10-CM | POA: Diagnosis not present

## 2021-11-12 DIAGNOSIS — D649 Anemia, unspecified: Secondary | ICD-10-CM | POA: Diagnosis not present

## 2021-11-12 DIAGNOSIS — N3281 Overactive bladder: Secondary | ICD-10-CM | POA: Diagnosis not present

## 2021-11-12 DIAGNOSIS — N189 Chronic kidney disease, unspecified: Secondary | ICD-10-CM | POA: Diagnosis not present

## 2021-11-12 NOTE — Telephone Encounter (Signed)
Transition Care Management Unsuccessful Follow-up Telephone Call  Date of discharge and from where:  [facility]; 11/10/2021  Attempts:  1st Attempt  Reason for unsuccessful TCM follow-up call:  Unable to leave message

## 2021-11-13 ENCOUNTER — Telehealth: Payer: Self-pay

## 2021-11-13 NOTE — Telephone Encounter (Signed)
Transition Care Management Unsuccessful Follow-up Telephone Call  Date of discharge and from where:  [facility]; 11/10/2021  Attempts:  2nd Attempt  Reason for unsuccessful TCM follow-up call:  Left voice message

## 2021-11-18 DIAGNOSIS — N390 Urinary tract infection, site not specified: Secondary | ICD-10-CM | POA: Diagnosis not present

## 2021-11-18 DIAGNOSIS — R609 Edema, unspecified: Secondary | ICD-10-CM | POA: Diagnosis not present

## 2021-11-18 DIAGNOSIS — N189 Chronic kidney disease, unspecified: Secondary | ICD-10-CM | POA: Diagnosis not present

## 2021-11-18 DIAGNOSIS — D649 Anemia, unspecified: Secondary | ICD-10-CM | POA: Diagnosis not present

## 2021-11-18 DIAGNOSIS — R4189 Other symptoms and signs involving cognitive functions and awareness: Secondary | ICD-10-CM | POA: Diagnosis not present

## 2021-11-18 DIAGNOSIS — G4700 Insomnia, unspecified: Secondary | ICD-10-CM | POA: Diagnosis not present

## 2021-11-21 DIAGNOSIS — R609 Edema, unspecified: Secondary | ICD-10-CM | POA: Diagnosis not present

## 2021-11-23 DIAGNOSIS — K560 Paralytic ileus: Secondary | ICD-10-CM | POA: Diagnosis not present

## 2021-11-23 DIAGNOSIS — N390 Urinary tract infection, site not specified: Secondary | ICD-10-CM | POA: Diagnosis not present

## 2021-11-23 DIAGNOSIS — S22010D Wedge compression fracture of first thoracic vertebra, subsequent encounter for fracture with routine healing: Secondary | ICD-10-CM | POA: Diagnosis not present

## 2021-11-23 DIAGNOSIS — F32A Depression, unspecified: Secondary | ICD-10-CM | POA: Diagnosis not present

## 2021-11-23 DIAGNOSIS — R609 Edema, unspecified: Secondary | ICD-10-CM | POA: Diagnosis not present

## 2021-11-23 DIAGNOSIS — R5381 Other malaise: Secondary | ICD-10-CM | POA: Diagnosis not present

## 2021-11-23 DIAGNOSIS — N3281 Overactive bladder: Secondary | ICD-10-CM | POA: Diagnosis not present

## 2021-11-23 DIAGNOSIS — D649 Anemia, unspecified: Secondary | ICD-10-CM | POA: Diagnosis not present

## 2021-11-24 ENCOUNTER — Telehealth: Payer: Self-pay | Admitting: Hematology and Oncology

## 2021-11-24 NOTE — Telephone Encounter (Signed)
Scheduled appt per 6/6 referral. Spoke to Juliann Pulse at [facility] who is aware of appt date and time.

## 2021-11-25 DIAGNOSIS — G4700 Insomnia, unspecified: Secondary | ICD-10-CM | POA: Diagnosis not present

## 2021-11-25 DIAGNOSIS — N390 Urinary tract infection, site not specified: Secondary | ICD-10-CM | POA: Diagnosis not present

## 2021-11-25 DIAGNOSIS — N3281 Overactive bladder: Secondary | ICD-10-CM | POA: Diagnosis not present

## 2021-11-25 DIAGNOSIS — N189 Chronic kidney disease, unspecified: Secondary | ICD-10-CM | POA: Diagnosis not present

## 2021-11-25 DIAGNOSIS — S32030D Wedge compression fracture of third lumbar vertebra, subsequent encounter for fracture with routine healing: Secondary | ICD-10-CM | POA: Diagnosis not present

## 2021-11-25 DIAGNOSIS — K5900 Constipation, unspecified: Secondary | ICD-10-CM | POA: Diagnosis not present

## 2021-11-25 DIAGNOSIS — D649 Anemia, unspecified: Secondary | ICD-10-CM | POA: Diagnosis not present

## 2021-11-25 DIAGNOSIS — R5381 Other malaise: Secondary | ICD-10-CM | POA: Diagnosis not present

## 2021-11-25 DIAGNOSIS — E876 Hypokalemia: Secondary | ICD-10-CM | POA: Diagnosis not present

## 2021-11-25 DIAGNOSIS — E559 Vitamin D deficiency, unspecified: Secondary | ICD-10-CM | POA: Diagnosis not present

## 2021-11-25 DIAGNOSIS — S22010D Wedge compression fracture of first thoracic vertebra, subsequent encounter for fracture with routine healing: Secondary | ICD-10-CM | POA: Diagnosis not present

## 2021-11-25 DIAGNOSIS — F32A Depression, unspecified: Secondary | ICD-10-CM | POA: Diagnosis not present

## 2021-12-02 DIAGNOSIS — N189 Chronic kidney disease, unspecified: Secondary | ICD-10-CM | POA: Diagnosis not present

## 2021-12-02 DIAGNOSIS — R5381 Other malaise: Secondary | ICD-10-CM | POA: Diagnosis not present

## 2021-12-02 DIAGNOSIS — D649 Anemia, unspecified: Secondary | ICD-10-CM | POA: Diagnosis not present

## 2021-12-02 DIAGNOSIS — G4700 Insomnia, unspecified: Secondary | ICD-10-CM | POA: Diagnosis not present

## 2021-12-02 DIAGNOSIS — K5900 Constipation, unspecified: Secondary | ICD-10-CM | POA: Diagnosis not present

## 2021-12-02 DIAGNOSIS — N390 Urinary tract infection, site not specified: Secondary | ICD-10-CM | POA: Diagnosis not present

## 2021-12-02 DIAGNOSIS — E876 Hypokalemia: Secondary | ICD-10-CM | POA: Diagnosis not present

## 2021-12-02 DIAGNOSIS — E559 Vitamin D deficiency, unspecified: Secondary | ICD-10-CM | POA: Diagnosis not present

## 2021-12-02 DIAGNOSIS — N3281 Overactive bladder: Secondary | ICD-10-CM | POA: Diagnosis not present

## 2021-12-09 DIAGNOSIS — R4189 Other symptoms and signs involving cognitive functions and awareness: Secondary | ICD-10-CM | POA: Diagnosis not present

## 2021-12-09 DIAGNOSIS — I10 Essential (primary) hypertension: Secondary | ICD-10-CM | POA: Diagnosis not present

## 2021-12-09 DIAGNOSIS — S32030D Wedge compression fracture of third lumbar vertebra, subsequent encounter for fracture with routine healing: Secondary | ICD-10-CM | POA: Diagnosis not present

## 2021-12-09 DIAGNOSIS — S22010D Wedge compression fracture of first thoracic vertebra, subsequent encounter for fracture with routine healing: Secondary | ICD-10-CM | POA: Diagnosis not present

## 2021-12-09 DIAGNOSIS — N189 Chronic kidney disease, unspecified: Secondary | ICD-10-CM | POA: Diagnosis not present

## 2021-12-09 DIAGNOSIS — D649 Anemia, unspecified: Secondary | ICD-10-CM | POA: Diagnosis not present

## 2021-12-10 ENCOUNTER — Other Ambulatory Visit: Payer: Self-pay

## 2021-12-10 ENCOUNTER — Inpatient Hospital Stay
Payer: No Typology Code available for payment source | Attending: Hematology and Oncology | Admitting: Hematology and Oncology

## 2021-12-10 ENCOUNTER — Inpatient Hospital Stay: Payer: No Typology Code available for payment source

## 2021-12-10 VITALS — BP 142/66 | HR 66 | Temp 98.1°F | Resp 16 | Ht 65.0 in | Wt 105.1 lb

## 2021-12-10 DIAGNOSIS — Z8711 Personal history of peptic ulcer disease: Secondary | ICD-10-CM | POA: Insufficient documentation

## 2021-12-10 DIAGNOSIS — Z87440 Personal history of urinary (tract) infections: Secondary | ICD-10-CM | POA: Diagnosis not present

## 2021-12-10 DIAGNOSIS — S32030D Wedge compression fracture of third lumbar vertebra, subsequent encounter for fracture with routine healing: Secondary | ICD-10-CM | POA: Diagnosis not present

## 2021-12-10 DIAGNOSIS — I10 Essential (primary) hypertension: Secondary | ICD-10-CM | POA: Diagnosis not present

## 2021-12-10 DIAGNOSIS — Z79899 Other long term (current) drug therapy: Secondary | ICD-10-CM | POA: Diagnosis not present

## 2021-12-10 DIAGNOSIS — K5900 Constipation, unspecified: Secondary | ICD-10-CM | POA: Diagnosis not present

## 2021-12-10 DIAGNOSIS — S22010D Wedge compression fracture of first thoracic vertebra, subsequent encounter for fracture with routine healing: Secondary | ICD-10-CM | POA: Diagnosis not present

## 2021-12-10 DIAGNOSIS — E785 Hyperlipidemia, unspecified: Secondary | ICD-10-CM | POA: Diagnosis not present

## 2021-12-10 DIAGNOSIS — R5381 Other malaise: Secondary | ICD-10-CM | POA: Diagnosis not present

## 2021-12-10 DIAGNOSIS — Y9301 Activity, walking, marching and hiking: Secondary | ICD-10-CM | POA: Diagnosis not present

## 2021-12-10 DIAGNOSIS — N189 Chronic kidney disease, unspecified: Secondary | ICD-10-CM | POA: Diagnosis not present

## 2021-12-10 DIAGNOSIS — N3020 Other chronic cystitis without hematuria: Secondary | ICD-10-CM | POA: Diagnosis not present

## 2021-12-10 DIAGNOSIS — D649 Anemia, unspecified: Secondary | ICD-10-CM

## 2021-12-10 LAB — RETIC PANEL
Immature Retic Fract: 11.8 % (ref 2.3–15.9)
RBC.: 3.47 MIL/uL — ABNORMAL LOW (ref 3.87–5.11)
Retic Count, Absolute: 65.9 10*3/uL (ref 19.0–186.0)
Retic Ct Pct: 1.9 % (ref 0.4–3.1)
Reticulocyte Hemoglobin: 33.7 pg (ref >27.9)

## 2021-12-10 LAB — CMP (CANCER CENTER ONLY)
ALT: 23 U/L (ref 0–44)
AST: 33 U/L (ref 15–41)
Albumin: 4.1 g/dL (ref 3.5–5.0)
Alkaline Phosphatase: 99 U/L (ref 38–126)
Anion gap: 8 (ref 5–15)
BUN: 26 mg/dL — ABNORMAL HIGH (ref 8–23)
CO2: 22 mmol/L (ref 22–32)
Calcium: 10.1 mg/dL (ref 8.9–10.3)
Chloride: 110 mmol/L (ref 98–111)
Creatinine: 1.2 mg/dL — ABNORMAL HIGH (ref 0.44–1.00)
GFR, Estimated: 42 mL/min — ABNORMAL LOW (ref >60)
Glucose, Bld: 80 mg/dL (ref 70–99)
Potassium: 4.2 mmol/L (ref 3.5–5.1)
Sodium: 140 mmol/L (ref 135–145)
Total Bilirubin: 0.3 mg/dL (ref 0.3–1.2)
Total Protein: 7.7 g/dL (ref 6.5–8.1)

## 2021-12-10 LAB — CBC WITH DIFFERENTIAL (CANCER CENTER ONLY)
Abs Immature Granulocytes: 0.09 10*3/uL — ABNORMAL HIGH (ref 0.00–0.07)
Basophils Absolute: 0.1 10*3/uL (ref 0.0–0.1)
Basophils Relative: 1 %
Eosinophils Absolute: 0.3 10*3/uL (ref 0.0–0.5)
Eosinophils Relative: 6 %
HCT: 34.4 % — ABNORMAL LOW (ref 36.0–46.0)
Hemoglobin: 11.2 g/dL — ABNORMAL LOW (ref 12.0–15.0)
Immature Granulocytes: 2 %
Lymphocytes Relative: 18 %
Lymphs Abs: 1.1 10*3/uL (ref 0.7–4.0)
MCH: 32.3 pg (ref 26.0–34.0)
MCHC: 32.6 g/dL (ref 30.0–36.0)
MCV: 99.1 fL (ref 80.0–100.0)
Monocytes Absolute: 0.6 10*3/uL (ref 0.1–1.0)
Monocytes Relative: 10 %
Neutro Abs: 3.6 10*3/uL (ref 1.7–7.7)
Neutrophils Relative %: 63 %
Platelet Count: 238 10*3/uL (ref 150–400)
RBC: 3.47 MIL/uL — ABNORMAL LOW (ref 3.87–5.11)
RDW: 15.6 % — ABNORMAL HIGH (ref 11.5–15.5)
WBC Count: 5.7 10*3/uL (ref 4.0–10.5)
nRBC: 0 % (ref 0.0–0.2)

## 2021-12-10 LAB — FERRITIN: Ferritin: 369 ng/mL — ABNORMAL HIGH (ref 11–307)

## 2021-12-10 LAB — IRON AND IRON BINDING CAPACITY (CC-WL,HP ONLY)
Iron: 74 ug/dL (ref 28–170)
Saturation Ratios: 23 % (ref 10.4–31.8)
TIBC: 318 ug/dL (ref 250–450)
UIBC: 244 ug/dL (ref 148–442)

## 2021-12-10 LAB — VITAMIN B12: Vitamin B-12: 1064 pg/mL — ABNORMAL HIGH (ref 180–914)

## 2021-12-10 LAB — LACTATE DEHYDROGENASE: LDH: 162 U/L (ref 98–192)

## 2021-12-10 LAB — FOLATE: Folate: 16.6 ng/mL (ref >5.9)

## 2021-12-11 LAB — KAPPA/LAMBDA LIGHT CHAINS
Kappa free light chain: 68.9 mg/L — ABNORMAL HIGH (ref 3.3–19.4)
Kappa, lambda light chain ratio: 1.73 — ABNORMAL HIGH (ref 0.26–1.65)
Lambda free light chains: 39.9 mg/L — ABNORMAL HIGH (ref 5.7–26.3)

## 2021-12-11 LAB — ERYTHROPOIETIN: Erythropoietin: 12.2 m[IU]/mL (ref 2.6–18.5)

## 2021-12-13 LAB — METHYLMALONIC ACID, SERUM: Methylmalonic Acid, Quantitative: 184 nmol/L (ref 0–378)

## 2021-12-14 ENCOUNTER — Telehealth: Payer: Self-pay | Admitting: *Deleted

## 2021-12-14 NOTE — Telephone Encounter (Signed)
Received call from pt's son, Lu Duffel. He is calling about pt's lab results. Reviewed message sent by Dr. Leonides Schanz. Advised labs have stabilized Lu Duffel asked that results be sent to [facility] as pt is staying there. Fax'd results to  that facility. Lu Duffel says his mom may be discharged from there in the next couple of weeks. Advised to keep [place] up to date on her living arrangements.

## 2021-12-15 ENCOUNTER — Ambulatory Visit: Payer: Medicare Other | Admitting: Family Medicine

## 2021-12-15 ENCOUNTER — Telehealth: Payer: Self-pay | Admitting: Hematology and Oncology

## 2021-12-16 DIAGNOSIS — F32A Depression, unspecified: Secondary | ICD-10-CM | POA: Diagnosis not present

## 2021-12-16 DIAGNOSIS — N3281 Overactive bladder: Secondary | ICD-10-CM | POA: Diagnosis not present

## 2021-12-16 DIAGNOSIS — K5900 Constipation, unspecified: Secondary | ICD-10-CM | POA: Diagnosis not present

## 2021-12-16 DIAGNOSIS — D649 Anemia, unspecified: Secondary | ICD-10-CM | POA: Diagnosis not present

## 2021-12-16 DIAGNOSIS — N189 Chronic kidney disease, unspecified: Secondary | ICD-10-CM | POA: Diagnosis not present

## 2021-12-16 DIAGNOSIS — E559 Vitamin D deficiency, unspecified: Secondary | ICD-10-CM | POA: Diagnosis not present

## 2021-12-16 DIAGNOSIS — S41111A Laceration without foreign body of right upper arm, initial encounter: Secondary | ICD-10-CM | POA: Diagnosis not present

## 2021-12-16 DIAGNOSIS — I10 Essential (primary) hypertension: Secondary | ICD-10-CM | POA: Diagnosis not present

## 2021-12-16 DIAGNOSIS — R5381 Other malaise: Secondary | ICD-10-CM | POA: Diagnosis not present

## 2021-12-16 DIAGNOSIS — R4189 Other symptoms and signs involving cognitive functions and awareness: Secondary | ICD-10-CM | POA: Diagnosis not present

## 2021-12-16 DIAGNOSIS — G4700 Insomnia, unspecified: Secondary | ICD-10-CM | POA: Diagnosis not present

## 2021-12-16 LAB — MULTIPLE MYELOMA PANEL, SERUM
Albumin SerPl Elph-Mcnc: 3.7 g/dL (ref 2.9–4.4)
Albumin/Glob SerPl: 1.2 (ref 0.7–1.7)
Alpha 1: 0.2 g/dL (ref 0.0–0.4)
Alpha2 Glob SerPl Elph-Mcnc: 0.9 g/dL (ref 0.4–1.0)
B-Globulin SerPl Elph-Mcnc: 1 g/dL (ref 0.7–1.3)
Gamma Glob SerPl Elph-Mcnc: 1.1 g/dL (ref 0.4–1.8)
Globulin, Total: 3.3 g/dL (ref 2.2–3.9)
IgA: 302 mg/dL (ref 64–422)
IgG (Immunoglobin G), Serum: 1113 mg/dL (ref 586–1602)
IgM (Immunoglobulin M), Srm: 83 mg/dL (ref 26–217)
Total Protein ELP: 7 g/dL (ref 6.0–8.5)

## 2021-12-17 ENCOUNTER — Encounter (HOSPITAL_COMMUNITY): Payer: Self-pay

## 2021-12-17 ENCOUNTER — Encounter (HOSPITAL_COMMUNITY)
Admission: EM | Disposition: A | Discharge status: Skilled Nursing Facility | Payer: Self-pay | Source: Skilled Nursing Facility | Attending: Internal Medicine

## 2021-12-17 ENCOUNTER — Inpatient Hospital Stay (HOSPITAL_COMMUNITY)
Admission: EM | Admit: 2021-12-17 | Discharge: 2021-12-21 | DRG: 522 | Disposition: A | Discharge status: Skilled Nursing Facility | Payer: Medicare Other | Source: Skilled Nursing Facility | Attending: Internal Medicine | Admitting: Internal Medicine

## 2021-12-17 ENCOUNTER — Inpatient Hospital Stay (HOSPITAL_COMMUNITY): Payer: Medicare Other | Admitting: Anesthesiology

## 2021-12-17 ENCOUNTER — Emergency Department (HOSPITAL_COMMUNITY): Payer: Medicare Other

## 2021-12-17 ENCOUNTER — Other Ambulatory Visit: Payer: Self-pay

## 2021-12-17 ENCOUNTER — Inpatient Hospital Stay (HOSPITAL_COMMUNITY): Payer: Medicare Other | Admitting: Certified Registered Nurse Anesthetist

## 2021-12-17 ENCOUNTER — Inpatient Hospital Stay (HOSPITAL_COMMUNITY): Payer: Medicare Other

## 2021-12-17 DIAGNOSIS — G8911 Acute pain due to trauma: Secondary | ICD-10-CM | POA: Diagnosis not present

## 2021-12-17 DIAGNOSIS — F32A Depression, unspecified: Secondary | ICD-10-CM | POA: Diagnosis not present

## 2021-12-17 DIAGNOSIS — W19XXXA Unspecified fall, initial encounter: Principal | ICD-10-CM

## 2021-12-17 DIAGNOSIS — D509 Iron deficiency anemia, unspecified: Secondary | ICD-10-CM | POA: Diagnosis present

## 2021-12-17 DIAGNOSIS — R8281 Pyuria: Secondary | ICD-10-CM

## 2021-12-17 DIAGNOSIS — I10 Essential (primary) hypertension: Secondary | ICD-10-CM | POA: Diagnosis not present

## 2021-12-17 DIAGNOSIS — F39 Unspecified mood [affective] disorder: Secondary | ICD-10-CM | POA: Diagnosis present

## 2021-12-17 DIAGNOSIS — Z961 Presence of intraocular lens: Secondary | ICD-10-CM | POA: Diagnosis present

## 2021-12-17 DIAGNOSIS — G8929 Other chronic pain: Secondary | ICD-10-CM | POA: Diagnosis not present

## 2021-12-17 DIAGNOSIS — R079 Chest pain, unspecified: Secondary | ICD-10-CM | POA: Diagnosis not present

## 2021-12-17 DIAGNOSIS — B9620 Unspecified Escherichia coli [E. coli] as the cause of diseases classified elsewhere: Secondary | ICD-10-CM | POA: Diagnosis present

## 2021-12-17 DIAGNOSIS — S72002A Fracture of unspecified part of neck of left femur, initial encounter for closed fracture: Principal | ICD-10-CM | POA: Diagnosis present

## 2021-12-17 DIAGNOSIS — R262 Difficulty in walking, not elsewhere classified: Secondary | ICD-10-CM | POA: Diagnosis not present

## 2021-12-17 DIAGNOSIS — H5461 Unqualified visual loss, right eye, normal vision left eye: Secondary | ICD-10-CM | POA: Diagnosis present

## 2021-12-17 DIAGNOSIS — D539 Nutritional anemia, unspecified: Secondary | ICD-10-CM

## 2021-12-17 DIAGNOSIS — Z79899 Other long term (current) drug therapy: Secondary | ICD-10-CM | POA: Diagnosis not present

## 2021-12-17 DIAGNOSIS — E440 Moderate protein-calorie malnutrition: Secondary | ICD-10-CM | POA: Diagnosis present

## 2021-12-17 DIAGNOSIS — E876 Hypokalemia: Secondary | ICD-10-CM | POA: Diagnosis not present

## 2021-12-17 DIAGNOSIS — N179 Acute kidney failure, unspecified: Secondary | ICD-10-CM | POA: Diagnosis not present

## 2021-12-17 DIAGNOSIS — Z471 Aftercare following joint replacement surgery: Secondary | ICD-10-CM | POA: Diagnosis not present

## 2021-12-17 DIAGNOSIS — K279 Peptic ulcer, site unspecified, unspecified as acute or chronic, without hemorrhage or perforation: Secondary | ICD-10-CM | POA: Diagnosis not present

## 2021-12-17 DIAGNOSIS — D649 Anemia, unspecified: Secondary | ICD-10-CM | POA: Diagnosis not present

## 2021-12-17 DIAGNOSIS — Z87440 Personal history of urinary (tract) infections: Secondary | ICD-10-CM

## 2021-12-17 DIAGNOSIS — E872 Acidosis, unspecified: Secondary | ICD-10-CM | POA: Diagnosis present

## 2021-12-17 DIAGNOSIS — W06XXXA Fall from bed, initial encounter: Secondary | ICD-10-CM | POA: Diagnosis present

## 2021-12-17 DIAGNOSIS — Z66 Do not resuscitate: Secondary | ICD-10-CM | POA: Diagnosis present

## 2021-12-17 DIAGNOSIS — M1990 Unspecified osteoarthritis, unspecified site: Secondary | ICD-10-CM | POA: Diagnosis not present

## 2021-12-17 DIAGNOSIS — K449 Diaphragmatic hernia without obstruction or gangrene: Secondary | ICD-10-CM | POA: Diagnosis not present

## 2021-12-17 DIAGNOSIS — E785 Hyperlipidemia, unspecified: Secondary | ICD-10-CM | POA: Diagnosis not present

## 2021-12-17 DIAGNOSIS — Z681 Body mass index (BMI) 19 or less, adult: Secondary | ICD-10-CM | POA: Diagnosis not present

## 2021-12-17 DIAGNOSIS — S79929A Unspecified injury of unspecified thigh, initial encounter: Secondary | ICD-10-CM | POA: Diagnosis not present

## 2021-12-17 DIAGNOSIS — R0902 Hypoxemia: Secondary | ICD-10-CM | POA: Diagnosis not present

## 2021-12-17 DIAGNOSIS — Z043 Encounter for examination and observation following other accident: Secondary | ICD-10-CM | POA: Diagnosis not present

## 2021-12-17 DIAGNOSIS — S72009A Fracture of unspecified part of neck of unspecified femur, initial encounter for closed fracture: Secondary | ICD-10-CM | POA: Diagnosis present

## 2021-12-17 DIAGNOSIS — M6281 Muscle weakness (generalized): Secondary | ICD-10-CM | POA: Diagnosis not present

## 2021-12-17 DIAGNOSIS — I129 Hypertensive chronic kidney disease with stage 1 through stage 4 chronic kidney disease, or unspecified chronic kidney disease: Secondary | ICD-10-CM | POA: Diagnosis present

## 2021-12-17 DIAGNOSIS — N183 Chronic kidney disease, stage 3 unspecified: Secondary | ICD-10-CM | POA: Diagnosis present

## 2021-12-17 DIAGNOSIS — D62 Acute posthemorrhagic anemia: Secondary | ICD-10-CM | POA: Diagnosis not present

## 2021-12-17 DIAGNOSIS — R41841 Cognitive communication deficit: Secondary | ICD-10-CM | POA: Diagnosis not present

## 2021-12-17 DIAGNOSIS — M25552 Pain in left hip: Secondary | ICD-10-CM | POA: Diagnosis not present

## 2021-12-17 DIAGNOSIS — K5904 Chronic idiopathic constipation: Secondary | ICD-10-CM | POA: Diagnosis not present

## 2021-12-17 DIAGNOSIS — H5440 Blindness, one eye, unspecified eye: Secondary | ICD-10-CM | POA: Diagnosis not present

## 2021-12-17 DIAGNOSIS — Z741 Need for assistance with personal care: Secondary | ICD-10-CM | POA: Diagnosis not present

## 2021-12-17 DIAGNOSIS — N1831 Chronic kidney disease, stage 3a: Secondary | ICD-10-CM | POA: Diagnosis not present

## 2021-12-17 DIAGNOSIS — Z9842 Cataract extraction status, left eye: Secondary | ICD-10-CM

## 2021-12-17 DIAGNOSIS — Z96642 Presence of left artificial hip joint: Secondary | ICD-10-CM | POA: Diagnosis not present

## 2021-12-17 DIAGNOSIS — K219 Gastro-esophageal reflux disease without esophagitis: Secondary | ICD-10-CM | POA: Diagnosis not present

## 2021-12-17 DIAGNOSIS — M4856XA Collapsed vertebra, not elsewhere classified, lumbar region, initial encounter for fracture: Secondary | ICD-10-CM | POA: Diagnosis present

## 2021-12-17 DIAGNOSIS — R4189 Other symptoms and signs involving cognitive functions and awareness: Secondary | ICD-10-CM | POA: Diagnosis present

## 2021-12-17 DIAGNOSIS — G4700 Insomnia, unspecified: Secondary | ICD-10-CM | POA: Diagnosis not present

## 2021-12-17 DIAGNOSIS — R0602 Shortness of breath: Secondary | ICD-10-CM | POA: Diagnosis not present

## 2021-12-17 DIAGNOSIS — R339 Retention of urine, unspecified: Secondary | ICD-10-CM | POA: Diagnosis not present

## 2021-12-17 DIAGNOSIS — Y92129 Unspecified place in nursing home as the place of occurrence of the external cause: Secondary | ICD-10-CM | POA: Diagnosis not present

## 2021-12-17 DIAGNOSIS — K589 Irritable bowel syndrome without diarrhea: Secondary | ICD-10-CM | POA: Diagnosis not present

## 2021-12-17 DIAGNOSIS — J9811 Atelectasis: Secondary | ICD-10-CM | POA: Diagnosis not present

## 2021-12-17 DIAGNOSIS — R2681 Unsteadiness on feet: Secondary | ICD-10-CM | POA: Diagnosis not present

## 2021-12-17 DIAGNOSIS — N390 Urinary tract infection, site not specified: Secondary | ICD-10-CM | POA: Diagnosis present

## 2021-12-17 DIAGNOSIS — R9431 Abnormal electrocardiogram [ECG] [EKG]: Secondary | ICD-10-CM | POA: Diagnosis not present

## 2021-12-17 DIAGNOSIS — Z7401 Bed confinement status: Secondary | ICD-10-CM | POA: Diagnosis not present

## 2021-12-17 DIAGNOSIS — R509 Fever, unspecified: Secondary | ICD-10-CM

## 2021-12-17 DIAGNOSIS — I959 Hypotension, unspecified: Secondary | ICD-10-CM | POA: Diagnosis not present

## 2021-12-17 DIAGNOSIS — N289 Disorder of kidney and ureter, unspecified: Secondary | ICD-10-CM | POA: Diagnosis not present

## 2021-12-17 DIAGNOSIS — M1611 Unilateral primary osteoarthritis, right hip: Secondary | ICD-10-CM | POA: Diagnosis not present

## 2021-12-17 DIAGNOSIS — Z9181 History of falling: Secondary | ICD-10-CM | POA: Diagnosis not present

## 2021-12-17 DIAGNOSIS — N189 Chronic kidney disease, unspecified: Secondary | ICD-10-CM | POA: Diagnosis not present

## 2021-12-17 DIAGNOSIS — S72042D Displaced fracture of base of neck of left femur, subsequent encounter for closed fracture with routine healing: Secondary | ICD-10-CM | POA: Diagnosis not present

## 2021-12-17 DIAGNOSIS — S32030D Wedge compression fracture of third lumbar vertebra, subsequent encounter for fracture with routine healing: Secondary | ICD-10-CM | POA: Diagnosis not present

## 2021-12-17 DIAGNOSIS — M545 Low back pain, unspecified: Secondary | ICD-10-CM | POA: Diagnosis not present

## 2021-12-17 DIAGNOSIS — S22010D Wedge compression fracture of first thoracic vertebra, subsequent encounter for fracture with routine healing: Secondary | ICD-10-CM | POA: Diagnosis not present

## 2021-12-17 HISTORY — PX: HIP ARTHROPLASTY: SHX981

## 2021-12-17 LAB — URINALYSIS, ROUTINE W REFLEX MICROSCOPIC
Bilirubin Urine: NEGATIVE
Glucose, UA: NEGATIVE mg/dL
Hgb urine dipstick: NEGATIVE
Ketones, ur: NEGATIVE mg/dL
Nitrite: POSITIVE — AB
Protein, ur: NEGATIVE mg/dL
Specific Gravity, Urine: 1.012 (ref 1.005–1.030)
pH: 5 (ref 5.0–8.0)

## 2021-12-17 LAB — CBC WITH DIFFERENTIAL/PLATELET
Abs Immature Granulocytes: 0.13 10*3/uL — ABNORMAL HIGH (ref 0.00–0.07)
Basophils Absolute: 0 10*3/uL (ref 0.0–0.1)
Basophils Relative: 0 %
Eosinophils Absolute: 0 10*3/uL (ref 0.0–0.5)
Eosinophils Relative: 0 %
HCT: 31.9 % — ABNORMAL LOW (ref 36.0–46.0)
Hemoglobin: 10.4 g/dL — ABNORMAL LOW (ref 12.0–15.0)
Immature Granulocytes: 1 %
Lymphocytes Relative: 5 %
Lymphs Abs: 0.6 10*3/uL — ABNORMAL LOW (ref 0.7–4.0)
MCH: 32.2 pg (ref 26.0–34.0)
MCHC: 32.6 g/dL (ref 30.0–36.0)
MCV: 98.8 fL (ref 80.0–100.0)
Monocytes Absolute: 0.8 10*3/uL (ref 0.1–1.0)
Monocytes Relative: 7 %
Neutro Abs: 10.2 10*3/uL — ABNORMAL HIGH (ref 1.7–7.7)
Neutrophils Relative %: 87 %
Platelets: 196 10*3/uL (ref 150–400)
RBC: 3.23 MIL/uL — ABNORMAL LOW (ref 3.87–5.11)
RDW: 14.7 % (ref 11.5–15.5)
WBC: 11.7 10*3/uL — ABNORMAL HIGH (ref 4.0–10.5)
nRBC: 0 % (ref 0.0–0.2)

## 2021-12-17 LAB — TYPE AND SCREEN
ABO/RH(D): O POS
Antibody Screen: NEGATIVE

## 2021-12-17 LAB — BASIC METABOLIC PANEL
Anion gap: 10 (ref 5–15)
BUN: 36 mg/dL — ABNORMAL HIGH (ref 8–23)
CO2: 17 mmol/L — ABNORMAL LOW (ref 22–32)
Calcium: 9 mg/dL (ref 8.9–10.3)
Chloride: 112 mmol/L — ABNORMAL HIGH (ref 98–111)
Creatinine, Ser: 1.12 mg/dL — ABNORMAL HIGH (ref 0.44–1.00)
GFR, Estimated: 45 mL/min — ABNORMAL LOW (ref >60)
Glucose, Bld: 121 mg/dL — ABNORMAL HIGH (ref 70–99)
Potassium: 4.3 mmol/L (ref 3.5–5.1)
Sodium: 139 mmol/L (ref 135–145)

## 2021-12-17 LAB — MRSA NEXT GEN BY PCR, NASAL: MRSA by PCR Next Gen: NOT DETECTED

## 2021-12-17 LAB — PROTIME-INR
INR: 1.4 — ABNORMAL HIGH (ref 0.8–1.2)
Prothrombin Time: 16.6 seconds — ABNORMAL HIGH (ref 11.4–15.2)

## 2021-12-17 LAB — ABO/RH: ABO/RH(D): O POS

## 2021-12-17 SURGERY — HEMIARTHROPLASTY, HIP, DIRECT ANTERIOR APPROACH, FOR FRACTURE
Anesthesia: General | Site: Hip | Laterality: Left

## 2021-12-17 MED ORDER — HYDROMORPHONE HCL 1 MG/ML IJ SOLN
0.5000 mg | Freq: Once | INTRAMUSCULAR | Status: AC
Start: 2021-12-17 — End: 2021-12-17
  Administered 2021-12-17: 0.5 mg via INTRAVENOUS
  Filled 2021-12-17: qty 1

## 2021-12-17 MED ORDER — 0.9 % SODIUM CHLORIDE (POUR BTL) OPTIME
TOPICAL | Status: DC | PRN
  Administered 2021-12-17: 1000 mL

## 2021-12-17 MED ORDER — LIDOCAINE 2% (20 MG/ML) 5 ML SYRINGE
INTRAMUSCULAR | Status: DC | PRN
  Administered 2021-12-17: 60 mg via INTRAVENOUS

## 2021-12-17 MED ORDER — PHENYLEPHRINE 80 MCG/ML (10ML) SYRINGE FOR IV PUSH (FOR BLOOD PRESSURE SUPPORT)
PREFILLED_SYRINGE | INTRAVENOUS | Status: DC | PRN
  Administered 2021-12-17 (×3): 80 ug via INTRAVENOUS

## 2021-12-17 MED ORDER — TRANEXAMIC ACID-NACL 1000-0.7 MG/100ML-% IV SOLN
1000.0000 mg | Freq: Once | INTRAVENOUS | Status: AC
  Administered 2021-12-17: 1000 mg via INTRAVENOUS
  Filled 2021-12-17: qty 100

## 2021-12-17 MED ORDER — BUPIVACAINE HCL (PF) 0.5 % IJ SOLN
INTRAMUSCULAR | Status: AC
  Filled 2021-12-17: qty 30

## 2021-12-17 MED ORDER — ACETAMINOPHEN 500 MG PO TABS
500.0000 mg | ORAL_TABLET | Freq: Four times a day (QID) | ORAL | Status: AC
  Administered 2021-12-18: 500 mg via ORAL
  Filled 2021-12-17: qty 1

## 2021-12-17 MED ORDER — SUGAMMADEX SODIUM 200 MG/2ML IV SOLN
INTRAVENOUS | Status: DC | PRN
  Administered 2021-12-17: 200 mg via INTRAVENOUS

## 2021-12-17 MED ORDER — FENTANYL CITRATE (PF) 100 MCG/2ML IJ SOLN
INTRAMUSCULAR | Status: AC
  Filled 2021-12-17: qty 2

## 2021-12-17 MED ORDER — FENTANYL CITRATE PF 50 MCG/ML IJ SOSY: 50.0000 ug | PREFILLED_SYRINGE | INTRAMUSCULAR | Status: DC

## 2021-12-17 MED ORDER — HYDROMORPHONE HCL 1 MG/ML IJ SOLN
0.5000 mg | INTRAMUSCULAR | Status: DC | PRN
  Administered 2021-12-17: 0.5 mg via INTRAVENOUS
  Filled 2021-12-17: qty 0.5

## 2021-12-17 MED ORDER — LACTATED RINGERS IV SOLN: INTRAVENOUS | Status: DC | PRN

## 2021-12-17 MED ORDER — HYDROCODONE-ACETAMINOPHEN 7.5-325 MG PO TABS: 1.0000 | ORAL_TABLET | ORAL | Status: DC | PRN

## 2021-12-17 MED ORDER — MORPHINE SULFATE (PF) 2 MG/ML IV SOLN: 0.5000 mg | INTRAVENOUS | Status: DC | PRN

## 2021-12-17 MED ORDER — POLYETHYLENE GLYCOL 3350 17 G PO PACK
17.0000 g | PACK | Freq: Every day | ORAL | Status: DC | PRN
  Administered 2021-12-18: 17 g via ORAL

## 2021-12-17 MED ORDER — PROPOFOL 10 MG/ML IV BOLUS
INTRAVENOUS | Status: AC
  Filled 2021-12-17: qty 20

## 2021-12-17 MED ORDER — ESMOLOL HCL 100 MG/10ML IV SOLN
INTRAVENOUS | Status: DC | PRN
  Administered 2021-12-17: 20 mg via INTRAVENOUS

## 2021-12-17 MED ORDER — PROPOFOL 1000 MG/100ML IV EMUL
INTRAVENOUS | Status: AC
  Filled 2021-12-17: qty 100

## 2021-12-17 MED ORDER — FENTANYL CITRATE PF 50 MCG/ML IJ SOSY
PREFILLED_SYRINGE | INTRAMUSCULAR | Status: AC
  Administered 2021-12-17: 50 ug via INTRAVENOUS
  Filled 2021-12-17: qty 2

## 2021-12-17 MED ORDER — METOCLOPRAMIDE HCL 5 MG/ML IJ SOLN: 5.0000 mg | Freq: Three times a day (TID) | INTRAMUSCULAR | Status: DC | PRN

## 2021-12-17 MED ORDER — PHENOL 1.4 % MT LIQD
1.0000 | OROMUCOSAL | Status: DC | PRN
  Administered 2021-12-18: 1 via OROMUCOSAL
  Filled 2021-12-17: qty 177

## 2021-12-17 MED ORDER — METOCLOPRAMIDE HCL 5 MG PO TABS: 5.0000 mg | ORAL_TABLET | Freq: Three times a day (TID) | ORAL | Status: DC | PRN

## 2021-12-17 MED ORDER — ONDANSETRON HCL 4 MG/2ML IJ SOLN: 4.0000 mg | Freq: Once | INTRAMUSCULAR | Status: DC | PRN

## 2021-12-17 MED ORDER — ROCURONIUM BROMIDE 10 MG/ML (PF) SYRINGE
PREFILLED_SYRINGE | INTRAVENOUS | Status: DC | PRN
  Administered 2021-12-17: 50 mg via INTRAVENOUS
  Administered 2021-12-17: 20 mg via INTRAVENOUS

## 2021-12-17 MED ORDER — TRANEXAMIC ACID-NACL 1000-0.7 MG/100ML-% IV SOLN
INTRAVENOUS | Status: AC
  Filled 2021-12-17: qty 100

## 2021-12-17 MED ORDER — OXYCODONE HCL 5 MG PO TABS
5.0000 mg | ORAL_TABLET | Freq: Four times a day (QID) | ORAL | Status: DC | PRN
  Filled 2021-12-17: qty 1

## 2021-12-17 MED ORDER — ROPIVACAINE HCL 5 MG/ML IJ SOLN
INTRAMUSCULAR | Status: DC | PRN
  Administered 2021-12-17: 20 mL via PERINEURAL

## 2021-12-17 MED ORDER — POVIDONE-IODINE 10 % EX SWAB
2.0000 | Freq: Once | CUTANEOUS | Status: AC
  Administered 2021-12-17: 2 via TOPICAL

## 2021-12-17 MED ORDER — CHLORHEXIDINE GLUCONATE 4 % EX LIQD
60.0000 mL | Freq: Once | CUTANEOUS | Status: DC
  Filled 2021-12-17: qty 60

## 2021-12-17 MED ORDER — PROPOFOL 10 MG/ML IV BOLUS
INTRAVENOUS | Status: DC | PRN
  Administered 2021-12-17: 100 ug/kg/min via INTRAVENOUS
  Administered 2021-12-17: 70 mg via INTRAVENOUS

## 2021-12-17 MED ORDER — MORPHINE SULFATE (PF) 2 MG/ML IV SOLN: 2.0000 mg | INTRAVENOUS | Status: DC | PRN

## 2021-12-17 MED ORDER — FENTANYL CITRATE (PF) 100 MCG/2ML IJ SOLN
INTRAMUSCULAR | Status: DC | PRN
  Administered 2021-12-17: 50 ug via INTRAVENOUS

## 2021-12-17 MED ORDER — ACETAMINOPHEN 10 MG/ML IV SOLN
INTRAVENOUS | Status: AC
  Administered 2021-12-17: 1000 mg
  Filled 2021-12-17: qty 100

## 2021-12-17 MED ORDER — ACETAMINOPHEN 325 MG PO TABS
650.0000 mg | ORAL_TABLET | Freq: Four times a day (QID) | ORAL | Status: DC | PRN
  Filled 2021-12-17 (×4): qty 2

## 2021-12-17 MED ORDER — ONDANSETRON HCL 4 MG PO TABS: 4.0000 mg | ORAL_TABLET | Freq: Four times a day (QID) | ORAL | Status: DC | PRN

## 2021-12-17 MED ORDER — BUPIVACAINE HCL (PF) 0.5 % IJ SOLN
INTRAMUSCULAR | Status: DC | PRN
  Administered 2021-12-17: 20 mL

## 2021-12-17 MED ORDER — FENTANYL CITRATE PF 50 MCG/ML IJ SOSY
25.0000 ug | PREFILLED_SYRINGE | INTRAMUSCULAR | Status: DC | PRN
  Administered 2021-12-17 (×2): 25 ug via INTRAVENOUS

## 2021-12-17 MED ORDER — ACETAMINOPHEN 325 MG PO TABS
325.0000 mg | ORAL_TABLET | Freq: Four times a day (QID) | ORAL | Status: DC | PRN
  Administered 2021-12-18: 325 mg via ORAL
  Administered 2021-12-19 – 2021-12-21 (×5): 650 mg via ORAL
  Filled 2021-12-17 (×2): qty 2

## 2021-12-17 MED ORDER — TRANEXAMIC ACID 1000 MG/10ML IV SOLN
INTRAVENOUS | Status: DC | PRN
  Administered 2021-12-17: 1000 mg via INTRAVENOUS

## 2021-12-17 MED ORDER — FENTANYL CITRATE PF 50 MCG/ML IJ SOSY
PREFILLED_SYRINGE | INTRAMUSCULAR | Status: AC
  Filled 2021-12-17: qty 1

## 2021-12-17 MED ORDER — HYDROCODONE-ACETAMINOPHEN 5-325 MG PO TABS
1.0000 | ORAL_TABLET | Freq: Four times a day (QID) | ORAL | Status: DC | PRN
  Filled 2021-12-17: qty 1

## 2021-12-17 MED ORDER — DOCUSATE SODIUM 100 MG PO CAPS
100.0000 mg | ORAL_CAPSULE | Freq: Two times a day (BID) | ORAL | Status: DC
  Administered 2021-12-18 – 2021-12-21 (×7): 100 mg via ORAL
  Filled 2021-12-17 (×7): qty 1

## 2021-12-17 MED ORDER — DEXAMETHASONE SODIUM PHOSPHATE 10 MG/ML IJ SOLN
INTRAMUSCULAR | Status: DC | PRN
  Administered 2021-12-17: 4 mg via INTRAVENOUS

## 2021-12-17 MED ORDER — HYDROCODONE-ACETAMINOPHEN 5-325 MG PO TABS
1.0000 | ORAL_TABLET | ORAL | Status: DC | PRN
  Administered 2021-12-18 – 2021-12-19 (×3): 1 via ORAL
  Filled 2021-12-17 (×2): qty 1

## 2021-12-17 MED ORDER — MENTHOL 3 MG MT LOZG
1.0000 | LOZENGE | OROMUCOSAL | Status: DC | PRN
Start: 2021-12-17 — End: 2021-12-21

## 2021-12-17 MED ORDER — SODIUM CHLORIDE 0.45 % IV SOLN: INTRAVENOUS | Status: DC

## 2021-12-17 MED ORDER — ASPIRIN 325 MG PO TBEC
325.0000 mg | DELAYED_RELEASE_TABLET | Freq: Every day | ORAL | Status: DC
  Administered 2021-12-18 – 2021-12-21 (×4): 325 mg via ORAL
  Filled 2021-12-17 (×5): qty 1

## 2021-12-17 MED ORDER — ONDANSETRON HCL 4 MG/2ML IJ SOLN
INTRAMUSCULAR | Status: DC | PRN
  Administered 2021-12-17: 4 mg via INTRAVENOUS

## 2021-12-17 MED ORDER — ONDANSETRON HCL 4 MG/2ML IJ SOLN: 4.0000 mg | Freq: Four times a day (QID) | INTRAMUSCULAR | Status: DC | PRN

## 2021-12-17 MED ORDER — CEFAZOLIN SODIUM-DEXTROSE 2-4 GM/100ML-% IV SOLN
2.0000 g | INTRAVENOUS | Status: AC
  Administered 2021-12-17: 2 g via INTRAVENOUS
  Filled 2021-12-17: qty 100

## 2021-12-17 SURGICAL SUPPLY — 58 items
APL PRP STRL LF DISP 70% ISPRP (MISCELLANEOUS) ×1
BAG COUNTER SPONGE SURGICOUNT (BAG) IMPLANT
BAG SPNG CNTER NS LX DISP (BAG)
BLADE SAW SGTL 73X25 THK (BLADE) ×2 IMPLANT
BRUSH FEMORAL CANAL (MISCELLANEOUS) IMPLANT
CHLORAPREP W/TINT 26 (MISCELLANEOUS) ×2 IMPLANT
COVER SURGICAL LIGHT HANDLE (MISCELLANEOUS) ×2 IMPLANT
DRAPE INCISE IOBAN 66X45 STRL (DRAPES) ×2 IMPLANT
DRAPE ORTHO SPLIT 77X108 STRL (DRAPES) ×4
DRAPE POUCH INSTRU U-SHP 10X18 (DRAPES) ×2 IMPLANT
DRAPE SURG ORHT 6 SPLT 77X108 (DRAPES) ×2 IMPLANT
DRAPE U-SHAPE 47X51 STRL (DRAPES) ×2 IMPLANT
DRAPE WARM FLUID 44X44 (DRAPES) ×2 IMPLANT
DRSG PAD ABDOMINAL 8X10 ST (GAUZE/BANDAGES/DRESSINGS) ×1 IMPLANT
ELECT BLADE TIP CTD 4 INCH (ELECTRODE) ×2 IMPLANT
ELECT REM PT RETURN 15FT ADLT (MISCELLANEOUS) ×2 IMPLANT
EVACUATOR 1/8 PVC DRAIN (DRAIN) ×1 IMPLANT
FACESHIELD WRAPAROUND (MASK) ×10 IMPLANT
FACESHIELD WRAPAROUND OR TEAM (MASK) ×5 IMPLANT
GAUZE SPONGE 4X4 12PLY STRL (GAUZE/BANDAGES/DRESSINGS) ×3 IMPLANT
GAUZE XEROFORM 5X9 LF (GAUZE/BANDAGES/DRESSINGS) ×2 IMPLANT
GLOVE BIOGEL PI IND STRL 8 (GLOVE) ×1 IMPLANT
GLOVE BIOGEL PI INDICATOR 8 (GLOVE) ×1
GLOVE ORTHO TXT STRL SZ7.5 (GLOVE) ×2 IMPLANT
HANDPIECE INTERPULSE COAX TIP (DISPOSABLE)
HEAD FEM UNIPOLAR 45 OD STRL (Hips) ×1 IMPLANT
IMMOBILIZER KNEE 20 (SOFTGOODS) ×2 IMPLANT
IMMOBILIZER KNEE 20 THIGH 36 (SOFTGOODS) ×1 IMPLANT
KIT BASIN OR (CUSTOM PROCEDURE TRAY) ×2 IMPLANT
KIT TURNOVER KIT A (KITS) IMPLANT
NDL MAYO CATGUT SZ4 TPR NDL (NEEDLE) ×1 IMPLANT
NEEDLE HYPO 22GX1.5 SAFETY (NEEDLE) ×1 IMPLANT
NEEDLE MAYO CATGUT SZ4 (NEEDLE) ×2 IMPLANT
NS IRRIG 1000ML POUR BTL (IV SOLUTION) ×4 IMPLANT
PACK TOTAL JOINT (CUSTOM PROCEDURE TRAY) ×2 IMPLANT
PASSER SUT SWANSON 36MM LOOP (INSTRUMENTS) ×2 IMPLANT
PROTECTOR NERVE ULNAR (MISCELLANEOUS) ×2 IMPLANT
SET HNDPC FAN SPRY TIP SCT (DISPOSABLE) IMPLANT
SPACER FEM TAPERED +5 12/14 (Hips) ×1 IMPLANT
STAPLER VISISTAT 35W (STAPLE) ×2 IMPLANT
STEM BASIC PRESS FIT SZ2 (Stem) ×1 IMPLANT
SUCTION FRAZIER HANDLE 12FR (TUBING) ×2
SUCTION TUBE FRAZIER 12FR DISP (TUBING) ×1 IMPLANT
SUT ETHIBOND NAB CT1 #1 30IN (SUTURE) ×8 IMPLANT
SUT VIC AB 0 CT1 27 (SUTURE) ×2
SUT VIC AB 0 CT1 27XBRD ANTBC (SUTURE) ×1 IMPLANT
SUT VIC AB 1 CT1 27 (SUTURE)
SUT VIC AB 1 CT1 27XBRD ANTBC (SUTURE) IMPLANT
SUT VIC AB 1 CTX 36 (SUTURE)
SUT VIC AB 1 CTX36XBRD ANBCTR (SUTURE) IMPLANT
SUT VIC AB 2-0 CT1 36 (SUTURE) ×6 IMPLANT
SYR 30ML LL (SYRINGE) ×1 IMPLANT
TAPE PAPER 3X10 WHT MICROPORE (GAUZE/BANDAGES/DRESSINGS) ×1 IMPLANT
TOWEL OR 17X26 10 PK STRL BLUE (TOWEL DISPOSABLE) ×4 IMPLANT
TOWER CARTRIDGE SMART MIX (DISPOSABLE) IMPLANT
TRAY FOLEY MTR SLVR 16FR STAT (SET/KITS/TRAYS/PACK) ×2 IMPLANT
WATER STERILE IRR 1000ML POUR (IV SOLUTION) ×2 IMPLANT
YANKAUER SUCT BULB TIP NO VENT (SUCTIONS) ×1 IMPLANT

## 2021-12-17 NOTE — Anesthesia Preprocedure Evaluation (Addendum)
Anesthesia Evaluation  Patient identified by MRN, date of birth, ID band Patient awake    Reviewed: Allergy & Precautions, NPO status , Patient's Chart, lab work & pertinent test results  Airway Mallampati: II  TM Distance: >3 FB Neck ROM: Full    Dental  (+) Teeth Intact, Dental Advisory Given   Pulmonary neg pulmonary ROS,    Pulmonary exam normal breath sounds clear to auscultation       Cardiovascular hypertension, Pt. on medications Normal cardiovascular exam Rhythm:Regular Rate:Normal     Neuro/Psych PSYCHIATRIC DISORDERS Depression negative neurological ROS     GI/Hepatic Neg liver ROS, PUD, GERD  Medicated,  Endo/Other  negative endocrine ROS  Renal/GU Renal InsufficiencyRenal disease Bladder dysfunction      Musculoskeletal  (+) Arthritis , Left femoral neck fracture    Abdominal   Peds  Hematology  (+) Blood dyscrasia, anemia , Plt 196k   Anesthesia Other Findings Day of surgery medications reviewed with the patient.  Reproductive/Obstetrics                             Anesthesia Physical Anesthesia Plan  ASA: 3  Anesthesia Plan: General   Post-op Pain Management: Ofirmev IV (intra-op)*   Induction: Intravenous  PONV Risk Score and Plan: 3 and TIVA, Treatment may vary due to age or medical condition, Dexamethasone and Ondansetron  Airway Management Planned: Oral ETT  Additional Equipment:   Intra-op Plan:   Post-operative Plan: Extubation in OR  Informed Consent: I have reviewed the patients History and Physical, chart, labs and discussed the procedure including the risks, benefits and alternatives for the proposed anesthesia with the patient or authorized representative who has indicated his/her understanding and acceptance.     Dental advisory given  Plan Discussed with: CRNA  Anesthesia Plan Comments:        Anesthesia Quick Evaluation

## 2021-12-17 NOTE — Anesthesia Postprocedure Evaluation (Signed)
Anesthesia Post Note  Patient: Evelyn [name]  Procedure(s) Performed: ARTHROPLASTY BIPOLAR HIP (HEMIARTHROPLASTY) (Left: Hip)     Patient location during evaluation: PACU Anesthesia Type: General Level of consciousness: awake and alert Pain management: pain level controlled Vital Signs Assessment: post-procedure vital signs reviewed and stable Respiratory status: spontaneous breathing, nonlabored ventilation, respiratory function stable and patient connected to nasal cannula oxygen Cardiovascular status: blood pressure returned to baseline and stable Postop Assessment: no apparent nausea or vomiting Anesthetic complications: no   No notable events documented.  Last Vitals:  Vitals:   12/17/21 2045 12/17/21 2055  BP: (!) 147/71 (!) 147/70  Pulse: 88 88  Resp: 16   Temp:  36.8 C  SpO2: 93% 92%    Last Pain:  Vitals:   12/17/21 2055  TempSrc: Oral  PainSc: 0-No pain                 Collene Schlichter

## 2021-12-17 NOTE — Plan of Care (Signed)
  Problem: Education: Goal: Knowledge of General Education information will improve Description: Including pain rating scale, medication(s)/side effects and non-pharmacologic comfort measures Outcome: Progressing   Problem: Health Behavior/Discharge Planning: Goal: Ability to manage health-related needs will improve Outcome: Progressing   Problem: Clinical Measurements: Goal: Ability to maintain clinical measurements within normal limits will improve Outcome: Progressing Goal: Will remain free from infection Outcome: Progressing Goal: Diagnostic test results will improve Outcome: Progressing Goal: Respiratory complications will improve Outcome: Progressing Goal: Cardiovascular complication will be avoided Outcome: Progressing   Problem: Activity: Goal: Risk for activity intolerance will decrease Outcome: Progressing   Problem: Nutrition: Goal: Adequate nutrition will be maintained Outcome: Progressing   Problem: Coping: Goal: Level of anxiety will decrease Outcome: Progressing   Problem: Elimination: Goal: Will not experience complications related to bowel motility Outcome: Progressing Goal: Will not experience complications related to urinary retention Outcome: Progressing   Problem: Pain Managment: Goal: General experience of comfort will improve Outcome: Progressing   Problem: Education: Goal: Knowledge of the prescribed therapeutic regimen will improve Outcome: Progressing Goal: Understanding of discharge needs will improve Outcome: Progressing Goal: Individualized Educational Video(s) Outcome: Progressing   Problem: Skin Integrity: Goal: Will show signs of wound healing Outcome: Progressing   Problem: Pain Management: Goal: Pain level will decrease with appropriate interventions Outcome: Progressing   Problem: Clinical Measurements: Goal: Postoperative complications will be avoided or minimized Outcome: Progressing

## 2021-12-17 NOTE — Progress Notes (Signed)
Patient ID: Evelyn [name], female   DOB: 01/17/1925, 88 y.o.   MRN: [id number] 88 year old female with left femoral neck fracture.  She was staying in a skilled facility and was ambulatory with a walker after being discharged from the hospital last month 11/10/2021.  Chart history shows list of cognitive impairment chronic kidney disease hyperlipidemia and history of falls.  I talked on the phone with her daughter Misty Stanley and also her son Roseanne [employer]) and explained plans for 6 PM left hip hemiarthroplasty.  Urgent orthopedic orders placed.  I will see patient at 6 PM . Called and discussed with RN who has pt in ER to relay to patient.

## 2021-12-17 NOTE — Transfer of Care (Signed)
Immediate Anesthesia Transfer of Care Note  Patient: Evelyn [name]  Procedure(s) Performed: ARTHROPLASTY BIPOLAR HIP (HEMIARTHROPLASTY) (Left: Hip)  Patient Location: PACU  Anesthesia Type:GA combined with regional for post-op pain  Level of Consciousness: awake, alert , oriented and patient cooperative  Airway & Oxygen Therapy: Patient Spontanous Breathing and Patient connected to face mask oxygen  Post-op Assessment: Report given to RN and Post -op Vital signs reviewed and stable  Post vital signs: Reviewed and stable  Last Vitals:  Vitals Value Taken Time  BP 164/79 12/17/21 2000  Temp    Pulse 89 12/17/21 2001  Resp 13 12/17/21 2001  SpO2 98 % 12/17/21 2001  Vitals shown include unvalidated device data.  Last Pain:  Vitals:   12/17/21 1700  TempSrc: Oral  PainSc: 8       Patients Stated Pain Goal: 3 (12/17/21 1327)  Complications: No notable events documented.

## 2021-12-17 NOTE — ED Triage Notes (Signed)
Patient BIB EMS from [facility] . Per ems patient had a mechanical. Patient was reaching for something in her nightstand when her hip made a twist and she fell. Denies hitting her head . Denies LOC . Denies taking blood thinners. Patient left hip appears shortened . Skin is intact. 18g placed by EMS to the left forearm . Told by EMS,  patient was hypotensive on the field.

## 2021-12-17 NOTE — ED Provider Notes (Signed)
Dr. Ophelia Charter made aware of patient's hip fracture.  He does not have a preference on [facility] or [facility].  Plans for surgery this evening.   Glynn Octave, MD 12/17/21 [phone]

## 2021-12-17 NOTE — Op Note (Signed)
Preop diagnosis: Displaced left femoral neck fracture  Postop diagnosis: Same  Procedure: Left hip monopolar hemiarthroplasty for displaced left femoral neck fracture.  Surgeon: Annell Greening MD  Anesthesia: General orotracheal  EBL less than 300 cc  Drains: None  ImplantsImplants  Implants  STEM BASIC PRESS FIT SZ2 - [id number]  Inventory Item: STEM BASIC PRESS FIT SZ2 Serial no.:  Model/Cat no.: [id number]  Implant name: STEM BASIC PRESS FIT SZ2 - [id number] Laterality: Left Area: Hip  Manufacturer: [employer] Date of Manufacture:    Action: Implanted Number Used: 1   Device Identifier:  Device Identifier Type:     SPACER FEM TAPERED +5 12/14 - [id number]  Inventory Item: SPACER FEM TAPERED +5 12/14 Serial no.:  Model/Cat no.: [id number]  Implant name: SPACER FEM TAPERED +5 12/14 - [id number] Laterality: Left Area: Hip  Manufacturer: [employer] Date of Manufacture:    Action: Implanted Number Used: 1   Device Identifier:  Device Identifier Type:     HEAD FEM UNIPOLAR 45 OD STRL - [id number]  Inventory Item: HEAD FEM UNIPOLAR 45 OD STRL Serial no.:  Model/Cat no.: [id number]  Implant name: HEAD FEM UNIPOLAR 45 OD STRL - [id number] Laterality: Left Area: Hip  Manufacturer: [employer] Date of Manufacture:    Action: Implanted Number Used: 1   Device Identifier:  Device Identifier Type:      Procedure after standard prepping draping and intubation preoperative Ancef prophylaxis lateral positioning marked to frame 1015 drape ChloraPrep usual hip sheets drapes impervious stockinette Coban sterile skin marker Betadine Steri-Drape x2 sealing the skin timeout procedure was completed.  Posterior approach was made TXA was given 1 g.  Charnley tractor was placed performed as tagged and released posterior capsule opened hip capsule hematoma was removed with a neck fracture was comminuted.  Neck was cut slightly less than 1 fingerbreadth above the lesser trochanter.  Canal  starter then sequential broaching up to #2 with a tight canal the ball was removed with a corkscrew was sized and was a 45 head which fit nicely in the socket.  Trial sizers +5 gave restoration of leg lengths.  She flex to 90 degrees internal rotation to 80 degrees without dislocation of her hip.  Trials were removed permanent stem placed.  +5 neck length was assembled impacted tested and then hip was reduced protecting again the sciatic nerve with a finger underneath the piriformis against the posterior bone acetabular cup.  Hip was reduced with a nice suction excellent stability.  Piriformis repair to the gluteus medius posterior capsule repaired #1 Ethibond and the capsule #2 Vicryl subtenons tissue skin staple closure postop dressing transferred to cover room.

## 2021-12-17 NOTE — Plan of Care (Signed)
  Problem: Clinical Measurements: Goal: Respiratory complications will improve Outcome: Progressing   Problem: Clinical Measurements: Goal: Cardiovascular complication will be avoided Outcome: Progressing   Problem: Coping: Goal: Level of anxiety will decrease Outcome: Progressing   Problem: Elimination: Goal: Will not experience complications related to bowel motility Outcome: Progressing   Problem: Pain Managment: Goal: General experience of comfort will improve Outcome: Progressing   Problem: Safety: Goal: Ability to remain free from injury will improve Outcome: Progressing   

## 2021-12-17 NOTE — Progress Notes (Signed)
Report called to nurse April regarding nerve block. Daughter Misty Stanley given room number. Vital signs stable. Pt transported to floor

## 2021-12-17 NOTE — Progress Notes (Signed)
Pt back from pacu to floor in stable condition. VS and assessment performed. No needs on return to floor. Rn will continue to monitor.

## 2021-12-17 NOTE — Anesthesia Procedure Notes (Signed)
Anesthesia Regional Block: Peng block   Pre-Anesthetic Checklist: , timeout performed,  Correct Patient, Correct Site, Correct Laterality,  Correct Procedure, Correct Position, site marked,  Risks and benefits discussed,  Surgical consent,  Pre-op evaluation,  At surgeon's request and post-op pain management  Laterality: Left  Prep: chloraprep       Needles:  Injection technique: Single-shot  Needle Type: Echogenic Needle     Needle Length: 9cm  Needle Gauge: 21     Additional Needles:   Procedures:,,,, ultrasound used (permanent image in chart),,    Narrative:  Start time: 12/17/2021 11:39 AM End time: 12/17/2021 11:45 AM Injection made incrementally with aspirations every 5 mL.  Performed by: Personally  Anesthesiologist: Collene Schlichter, MD  Additional Notes: No pain on injection. No increased resistance to injection. Injection made in 5cc increments.  Good needle visualization.  Patient tolerated procedure well.

## 2021-12-17 NOTE — ED Notes (Signed)
Patient hip is binded with a sheet . Patient has purwick on . Daughter at bedside. Patient denies current pain.

## 2021-12-17 NOTE — Interval H&P Note (Signed)
History and Physical Interval Note:  12/17/2021 6:30 PM  Evelyn [name]  has presented today for surgery, with the diagnosis of LEFT FEMORAL NECK FRACTURE.  The various methods of treatment have been discussed with the patient and family. After consideration of risks, benefits and other options for treatment, the patient has consented to  Procedure(s): ARTHROPLASTY BIPOLAR HIP (HEMIARTHROPLASTY) (Left) as a surgical intervention.  The patient's history has been reviewed, patient examined, no change in status, stable for surgery.  I have reviewed the patient's chart and labs.  Questions were answered to the patient's satisfaction.     Eldred Manges

## 2021-12-17 NOTE — Progress Notes (Signed)
Pt being transported to short stay via bed.  Remains alert and oriented.  Daughter at bedside.

## 2021-12-17 NOTE — Anesthesia Procedure Notes (Signed)
Procedure Name: Intubation Date/Time: 12/17/2021 6:42 PM  Performed by: Cleda Daub, CRNAPre-anesthesia Checklist: Patient identified, Emergency Drugs available, Suction available and Patient being monitored Patient Re-evaluated:Patient Re-evaluated prior to induction Oxygen Delivery Method: Circle system utilized Preoxygenation: Pre-oxygenation with 100% oxygen Induction Type: IV induction Ventilation: Mask ventilation without difficulty Laryngoscope Size: Mac and 3 Grade View: Grade I Tube type: Oral Tube size: 7.0 mm Number of attempts: 1 Airway Equipment and Method: Stylet and Oral airway Placement Confirmation: ETT inserted through vocal cords under direct vision, positive ETCO2 and breath sounds checked- equal and bilateral Secured at: 21 cm Tube secured with: Tape Dental Injury: Teeth and Oropharynx as per pre-operative assessment

## 2021-12-17 NOTE — Anesthesia Preprocedure Evaluation (Signed)
Anesthesia Evaluation  Patient identified by MRN, date of birth, ID band Patient awake    Reviewed: Allergy & Precautions, NPO status , Patient's Chart, lab work & pertinent test results  Airway Mallampati: II  TM Distance: >3 FB Neck ROM: Full    Dental  (+) Dental Advisory Given, Chipped   Pulmonary neg pulmonary ROS,    Pulmonary exam normal breath sounds clear to auscultation       Cardiovascular hypertension, Pt. on medications Normal cardiovascular exam Rhythm:Regular Rate:Normal     Neuro/Psych PSYCHIATRIC DISORDERS Depression negative neurological ROS     GI/Hepatic Neg liver ROS, PUD, GERD  Medicated,  Endo/Other  negative endocrine ROS  Renal/GU Renal InsufficiencyRenal disease Bladder dysfunction      Musculoskeletal  (+) Arthritis , Left femur fracture    Abdominal   Peds  Hematology negative hematology ROS (+)   Anesthesia Other Findings Day of surgery medications reviewed with the patient.  Reproductive/Obstetrics                             Anesthesia Physical Anesthesia Plan  ASA: 3  Anesthesia Plan: Regional   Post-op Pain Management:    Induction:   PONV Risk Score and Plan: 2 and Treatment may vary due to age or medical condition  Airway Management Planned: Natural Airway  Additional Equipment:   Intra-op Plan:   Post-operative Plan:   Informed Consent: I have reviewed the patients History and Physical, chart, labs and discussed the procedure including the risks, benefits and alternatives for the proposed anesthesia with the patient or authorized representative who has indicated his/her understanding and acceptance.     Dental advisory given  Plan Discussed with: CRNA  Anesthesia Plan Comments: (PENG nerve block to bridge to OR this PM.)        Anesthesia Quick Evaluation

## 2021-12-17 NOTE — Progress Notes (Signed)
Pt has arrived from short stay to 1322.  Report accepted from [facility].  Pt is alert and oriented.  Room orientation completed with call bell placed at bedside.  Daughter called and message left concerning pt's arrival to unit.  Will continue to monitor pt.

## 2021-12-17 NOTE — Consult Note (Signed)
Reason for Consult: Left displaced closed femoral neck fracture Referring Physician: Dr.Dibia  Evelyn [name] is an 88 y.o. female.  HPI: 88 year old female here with her daughter with fall and displaced left femoral neck fracture inability to walk and severe pain.  Patient has hyperlipidemia history reviewed recurrent UTIs depression hypertension history of fall she had been in the skilled nursing facility and was progressing hoping to move to assisted living when she slipped trying to stabilize herself with a walker which she uses for ambulation and had sharp left hip pain.  [name] is available by phone and I discussed in detail with both son and daughter as well as patient and outlined treatment plan today.  Past Medical History:  Diagnosis Date   Allergy    Arthritis    Chronic cystitis    Congenital blindness    right eye   Depression    Gastric ulcer    H/O: hematuria    Hepatitis, unspecified    statin related, resolved quickly   Hyperlipidemia    Legally blind in right eye, as defined in [place]    Syncope and collapse 04/01/2014   UTI (lower urinary tract infection)    HISTORY OF UTI    Past Surgical History:  Procedure Laterality Date   ABDOMINAL HYSTERECTOMY     vaginal   BLADDER SUSPENSION     A-P   CATARACT EXTRACTION W/ INTRAOCULAR LENS IMPLANT Left    ESOPHAGOGASTRODUODENOSCOPY     OPEN REDUCTION INTERNAL FIXATION (ORIF) DISTAL RADIAL FRACTURE Right 03/09/2016   Procedure: OPEN REDUCTION INTERNAL FIXATION (ORIF) DISTAL RADIAL FRACTURE;  Surgeon: Betha Loa, MD;  Location: [facility];  Service: Orthopedics;  Laterality: Right;    Family History  Problem Relation Age of Onset   Cancer Neg Hx        Colon cancer   Stroke Neg Hx    Heart disease Neg Hx    Hyperlipidemia Neg Hx    Hypertension Neg Hx    Kidney disease Neg Hx    Colon polyps Neg Hx    Colon cancer Neg Hx     Social History:  reports that she has never smoked. She has  never used smokeless tobacco. She reports that she does not drink alcohol and does not use drugs.  Allergies: No Known Allergies  Medications: I have reviewed the patient's current medications.  Results for orders placed or performed during the hospital encounter of 12/17/21 (from the past 48 hour(s))  CBC with Differential     Status: Abnormal   Collection Time: 12/17/21  4:30 AM  Result Value Ref Range   WBC 11.7 (H) 4.0 - 10.5 K/uL   RBC 3.23 (L) 3.87 - 5.11 MIL/uL   Hemoglobin 10.4 (L) 12.0 - 15.0 g/dL   HCT 79.3 (L) 90.3 - 00.9 %   MCV 98.8 80.0 - 100.0 fL   MCH 32.2 26.0 - 34.0 pg   MCHC 32.6 30.0 - 36.0 g/dL   RDW 23.3 00.7 - 62.2 %   Platelets 196 150 - 400 K/uL   nRBC 0.0 0.0 - 0.2 %   Neutrophils Relative % 87 %   Neutro Abs 10.2 (H) 1.7 - 7.7 K/uL   Lymphocytes Relative 5 %   Lymphs Abs 0.6 (L) 0.7 - 4.0 K/uL   Monocytes Relative 7 %   Monocytes Absolute 0.8 0.1 - 1.0 K/uL   Eosinophils Relative 0 %   Eosinophils Absolute 0.0 0.0 - 0.5 K/uL  Basophils Relative 0 %   Basophils Absolute 0.0 0.0 - 0.1 K/uL   Immature Granulocytes 1 %   Abs Immature Granulocytes 0.13 (H) 0.00 - 0.07 K/uL    Comment: Performed at [facility], [address], [place], [place] [address]  Basic metabolic panel     Status: Abnormal   Collection Time: 12/17/21  4:30 AM  Result Value Ref Range   Sodium 139 135 - 145 mmol/L   Potassium 4.3 3.5 - 5.1 mmol/L   Chloride 112 (H) 98 - 111 mmol/L   CO2 17 (L) 22 - 32 mmol/L   Glucose, Bld 121 (H) 70 - 99 mg/dL    Comment: Glucose reference range applies only to samples taken after fasting for at least 8 hours.   BUN 36 (H) 8 - 23 mg/dL   Creatinine, Ser 6.94 (H) 0.44 - 1.00 mg/dL   Calcium 9.0 8.9 - 85.4 mg/dL   GFR, Estimated 45 (L) >60 mL/min    Comment: (NOTE) Calculated using the CKD-EPI Creatinine Equation (2021)    Anion gap 10 5 - 15    Comment: Performed at [facility], [address]  [place], [place] [address]  ABO/Rh     Status: None   Collection Time: 12/17/21  4:30 AM  Result Value Ref Range   ABO/RH(D)      O POS Performed at [facility], [address] [place], [place] [address]   Protime-INR     Status: Abnormal   Collection Time: 12/17/21  7:42 AM  Result Value Ref Range   Prothrombin Time 16.6 (H) 11.4 - 15.2 seconds   INR 1.4 (H) 0.8 - 1.2    Comment: (NOTE) INR goal varies based on device and disease states. Performed at [facility], [address] [place], [place] [address]   Type and screen [facility]     Status: None   Collection Time: 12/17/21  9:48 AM  Result Value Ref Range   ABO/RH(D) O POS    Antibody Screen NEG    Sample Expiration      12/20/2021,2359 Performed at [facility], [address], [place], [place] [address]   MRSA Next Gen by PCR, Nasal     Status: None   Collection Time: 12/17/21 11:54 AM   Specimen: Nasal Mucosa; Nasal Swab  Result Value Ref Range   MRSA by PCR Next Gen NOT DETECTED NOT DETECTED    Comment: (NOTE) The GeneXpert MRSA Assay (FDA approved for NASAL specimens only), is one component of a comprehensive MRSA colonization surveillance program. It is not intended to diagnose MRSA infection nor to guide or monitor treatment for MRSA infections. Test performance is not FDA approved in patients less than 68 years old. Performed at [facility], [address], [place], [place] [address]   Urinalysis, Routine w reflex microscopic     Status: Abnormal   Collection Time: 12/17/21  4:22 PM  Result Value Ref Range   Color, Urine YELLOW YELLOW   APPearance HAZY (A) CLEAR   Specific Gravity, Urine 1.012 1.005 - 1.030   pH 5.0 5.0 - 8.0   Glucose, UA NEGATIVE NEGATIVE mg/dL   Hgb urine dipstick NEGATIVE NEGATIVE   Bilirubin Urine NEGATIVE NEGATIVE   Ketones, ur NEGATIVE NEGATIVE mg/dL   Protein, ur NEGATIVE NEGATIVE  mg/dL   Nitrite POSITIVE (A) NEGATIVE   Leukocytes,Ua SMALL (A) NEGATIVE   RBC / HPF 0-5  0 - 5 RBC/hpf   WBC, UA 11-20 0 - 5 WBC/hpf   Bacteria, UA MANY (A) NONE SEEN   Squamous Epithelial / LPF 0-5 0 - 5    Comment: Performed at [facility], [address] [place], [place] [address]    DG Lumbar Spine 2-3 Views  Result Date: 12/17/2021 CLINICAL DATA:  88 year old female status post fall this morning with left hip pain and deformity. EXAM: LUMBAR SPINE - 2-3 VIEW COMPARISON:  CT Abdomen and Pelvis 11/05/2021. FINDINGS: AP and cross-table lateral views. Osteopenia. Chronic dextroconvex thoracic and levoconvex lumbar scoliosis. Chronically exaggerated lumbar lordosis. Osteopenia. Chronic L3 and T12 compression fractures appear stable from last month. Multilevel chronic severe disc and endplate degeneration. Left femoral neck fracture is visible (see left hip series today). No other acute osseous abnormality identified. Non obstructed bowel gas pattern. Aortoiliac calcified atherosclerosis. IMPRESSION: 1. No acute osseous abnormality identified in the lumbar spine. Chronic T12 and L3 compression fractures. 2. Left femoral neck fracture (see left hip series today). 3.  Aortic Atherosclerosis (ICD10-I70.0). Electronically Signed   By: Odessa Fleming M.D.   On: 12/17/2021 06:05   DG Chest 1 View  Result Date: 12/17/2021 CLINICAL DATA:  88 year old female status post fall this morning with left hip pain and deformity. Left femur fracture. EXAM: CHEST  1 VIEW COMPARISON:  Chest radiographs 07/16/2017 and earlier. FINDINGS: Portable AP semi upright view at 0542 hours. Moderate sized chronic gastric hiatal hernia. Stable cardiac size and mediastinal contours. Lung volumes are stable at the upper limits of normal. Skin fold artifact in the bilateral chest. Allowing for portable technique the lungs are clear. No pneumothorax. Visualized tracheal air column is within normal limits. No acute osseous  abnormality identified. Negative visible bowel gas. IMPRESSION: No acute cardiopulmonary abnormality. Chronic hiatal hernia. Electronically Signed   By: Odessa Fleming M.D.   On: 12/17/2021 06:01   DG HIP UNILAT W OR W/O PELVIS 2-3 VIEWS LEFT  Result Date: 12/17/2021 CLINICAL DATA:  88 year old female status post fall this morning with left hip pain and deformity. EXAM: DG HIP (WITH OR WITHOUT PELVIS) 2-3V LEFT COMPARISON:  CT Abdomen and Pelvis 10/06/2021. FINDINGS: Impacted left femoral neck fracture. Left femoral head remains normally located. Left femur intertrochanteric segment appears to remain intact. Chronic bone anchors at the pubic rami. No acute pelvis fracture identified. Chronic left inferior pubic ramus fracture. Right hip joint space loss and endplate spurring. Grossly intact proximal right femur. Negative visible bowel gas pattern. IMPRESSION: Impacted left femoral neck fracture. Electronically Signed   By: Odessa Fleming M.D.   On: 12/17/2021 05:59    ROS updated 14 point view of systems unchanged from H&P note by Dr.Dibia.  Recent hospital admission from May reviewed in detail.  Medical history reviewed with daughter and son as well. Blood pressure (!) 171/74, pulse 74, temperature (!) 101.2 F (38.4 C), temperature source Oral, resp. rate 18, height 5\' 5"  (1.651 m), weight 47.7 kg, SpO2 95 %. Physical Exam Constitutional:      Appearance: Normal appearance.  HENT:     Head: Normocephalic.     Right Ear: External ear normal.     Left Ear: External ear normal.     Nose: Nose normal.  Eyes:     Extraocular Movements: Extraocular movements intact.  Cardiovascular:     Rate and Rhythm: Normal rate.     Pulses: Normal pulses.  Pulmonary:     Effort: Pulmonary effort is normal.  Abdominal:  Palpations: Abdomen is soft.  Musculoskeletal:        General: Tenderness and deformity present.  Skin:    General: Skin is warm.  Neurological:     General: No focal deficit present.     Mental  Status: She is alert.  Psychiatric:        Mood and Affect: Mood normal.        Behavior: Behavior normal.     Assessment/Plan: Left hip shortened externally rotated.  X-rays reviewed which showed displaced femoral neck fracture.  Discussed plan for hip hemiarthroplasty with weightbearing as tolerated postop.  She will need to be in a knee immobilizer postop and can remove it for ambulation.  Daughter states her plan is to return her to the same skilled facility which they were very pleased with.  Eldred Manges 12/17/2021, 7:55 PM

## 2021-12-17 NOTE — ED Provider Notes (Signed)
[facility]-EMERGENCY DEPT Provider Note  CSN: [id number] Arrival date & time: 12/17/21 7062  Chief Complaint(s) Mechanical Fall  HPI Evelyn [name] is a 88 y.o. female with a past medical history listed below who presents from skilled nursing facility after mechanical fall while trying to get out of bed to use the restroom.  She reports slipping while trying to stabilize herself with her walker causing her to fall onto her left side.  She felt immediatesevere left hip and lower back pain.  This is worse with movement and palpation.  She denies any head trauma or loss of consciousness.  Denies any headache or neck pain.  No chest pain or upper back pain.  No abdominal pain.  No other extremity pain.  The history is provided by the patient.    Past Medical History Past Medical History:  Diagnosis Date   Allergy    Arthritis    Chronic cystitis    Congenital blindness    right eye   Depression    Gastric ulcer    H/O: hematuria    Hepatitis, unspecified    statin related, resolved quickly   Hyperlipidemia    Legally blind in right eye, as defined in [place]    Syncope and collapse 04/01/2014   UTI (lower urinary tract infection)    HISTORY OF UTI   Patient Active Problem List   Diagnosis Date Noted   Fracture of femoral neck, left (HCC) 12/17/2021   Delirium 11/09/2021   HTN (hypertension) 11/08/2021   Hypokalemia 11/08/2021   Compression fracture of body of thoracic vertebra (HCC) 11/06/2021   Acute anemia 11/06/2021   Cognitive impairment 11/06/2021   Lactic acidosis 11/06/2021   Black stools    Heme + stool    Peptic ulcer disease    Acute metabolic encephalopathy 11/05/2021   Closed wedge compression fracture of T12 vertebra (HCC) 11/24/2016   Chronic midline low back pain 11/18/2016   Chronic renal disease, stage 3, moderately decreased glomerular filtration rate (GFR) between 30-59 mL/min/1.73 square meter (HCC) 11/18/2016   IBS (irritable bowel  syndrome)    UTI (urinary tract infection)    Routine general medical examination at a health care facility 05/03/2013   Chronic idiopathic constipation 03/31/2012   Other screening mammogram 03/24/2011   Depression with somatization 03/06/2010   Insomnia 03/08/2008   Hyperlipidemia with target LDL less than 130 02/21/2007   Home Medication(s) Prior to Admission medications   Medication Sig Start Date End Date Taking? Authorizing Provider  acetaminophen (TYLENOL) 325 MG tablet Take 650 mg by mouth every 6 (six) hours as needed.    [provider]  acetaminophen (TYLENOL) 500 MG tablet Take 500 mg by mouth every 6 (six) hours as needed.    [provider]  amLODipine (NORVASC) 10 MG tablet Take 1 tablet (10 mg total) by mouth daily. 11/10/21   Leatha Gilding, MD  amLODipine (NORVASC) 2.5 MG tablet Take 2.5 mg by mouth daily. 11/23/21   [provider]  amLODipine (NORVASC) 5 MG tablet Take 5 mg by mouth daily. 11/18/21   [provider]  cholecalciferol (VITAMIN D3) 25 MCG (1000 UNIT) tablet Take 1,000 Units by mouth daily.    [provider]  docusate sodium (COLACE) 100 MG capsule Take 100 mg by mouth daily.    [provider]  FLUoxetine (PROZAC) 20 MG capsule Take 20 mg by mouth daily.    [provider]  furosemide (LASIX) 40 MG tablet Take  40 mg by mouth daily. 11/23/21   [provider]  hydrochlorothiazide (MICROZIDE) 12.5 MG capsule Take 12.5 mg by mouth daily. 12/09/21   [provider]  oxybutynin 2.5 MG TABS Take 2.5 mg by mouth 3 (three) times daily. 11/10/21   Caren Griffins, MD  pantoprazole (PROTONIX) 40 MG tablet Take 1 tablet (40 mg total) by mouth daily before breakfast. 11/10/21   Gherghe, Vella Redhead, MD  polyethylene glycol (MIRALAX / GLYCOLAX) 17 g packet Take 17 g by mouth as needed for moderate constipation.    [provider]  senna (SENOKOT) 8.6 MG TABS tablet Take 1 tablet by mouth  daily.    [provider]  zolpidem (AMBIEN) 10 MG tablet Take 1/2 tablet by mouth once daily at bedtime as needed for sleep. Patient taking differently: Take 5 mg by mouth at bedtime. 07/01/21   Wardell Honour, MD                                                                                                                                    Allergies Patient has no known allergies.  Review of Systems Review of Systems As noted in HPI  Physical Exam Vital Signs  I have reviewed the triage vital signs BP (!) 172/84   Pulse 77   Resp 17   SpO2 100%   Physical Exam Constitutional:      General: She is not in acute distress.    Appearance: She is well-developed. She is not diaphoretic.  HENT:     Head: Normocephalic and atraumatic.     Right Ear: External ear normal.     Left Ear: External ear normal.     Nose: Nose normal.  Eyes:     General: No scleral icterus.       Right eye: No discharge.        Left eye: No discharge.     Conjunctiva/sclera: Conjunctivae normal.     Pupils: Pupils are equal, round, and reactive to light.  Cardiovascular:     Rate and Rhythm: Normal rate and regular rhythm.     Pulses:          Radial pulses are 2+ on the right side and 2+ on the left side.       Dorsalis pedis pulses are 2+ on the right side and 2+ on the left side.     Heart sounds: Normal heart sounds. No murmur heard.    No friction rub. No gallop.  Pulmonary:     Effort: Pulmonary effort is normal. No respiratory distress.     Breath sounds: Normal breath sounds. No stridor. No wheezing.  Abdominal:     General: There is no distension.     Palpations: Abdomen is soft.     Tenderness: There is no abdominal tenderness.  Musculoskeletal:     Cervical back: Normal range of motion and neck supple. No  bony tenderness.     Thoracic back: No bony tenderness.     Lumbar back: Tenderness present. No bony tenderness.     Left hip: Deformity (shortened), tenderness and bony  tenderness present. Decreased range of motion.     Right foot: Normal pulse.     Left foot: Normal pulse.     Comments: Clavicles stable. Chest stable to AP/Lat compression. Pelvis stable to Lat compression. No obvious extremity deformity. No chest or abdominal wall contusion.  Skin:    General: Skin is warm and dry.     Findings: No erythema or rash.  Neurological:     Mental Status: She is alert and oriented to person, place, and time.     Comments: Moving all extremities     ED Results and Treatments Labs (all labs ordered are listed, but only abnormal results are displayed) Labs Reviewed  CBC WITH DIFFERENTIAL/PLATELET - Abnormal; Notable for the following components:      Result Value   WBC 11.7 (*)    RBC 3.23 (*)    Hemoglobin 10.4 (*)    HCT 31.9 (*)    Neutro Abs 10.2 (*)    Lymphs Abs 0.6 (*)    Abs Immature Granulocytes 0.13 (*)    All other components within normal limits  BASIC METABOLIC PANEL - Abnormal; Notable for the following components:   Chloride 112 (*)    CO2 17 (*)    Glucose, Bld 121 (*)    BUN 36 (*)    Creatinine, Ser 1.12 (*)    GFR, Estimated 45 (*)    All other components within normal limits  PROTIME-INR                                                                                                                         EKG  EKG Interpretation  Date/Time:  Thursday December 17 2021 06:41:09 EDT Ventricular Rate:  79 PR Interval:  142 QRS Duration: 84 QT Interval:  403 QTC Calculation: 462 R Axis:   66 Text Interpretation: Sinus rhythm Abnormal R-wave progression, early transition No acute changes Confirmed by Addison Lank [phone]) on 12/17/2021 7:15:22 AM       Radiology DG Lumbar Spine 2-3 Views  Result Date: 12/17/2021 CLINICAL DATA:  88 year old female status post fall this morning with left hip pain and deformity. EXAM: LUMBAR SPINE - 2-3 VIEW COMPARISON:  CT Abdomen and Pelvis 11/05/2021. FINDINGS: AP and cross-table lateral  views. Osteopenia. Chronic dextroconvex thoracic and levoconvex lumbar scoliosis. Chronically exaggerated lumbar lordosis. Osteopenia. Chronic L3 and T12 compression fractures appear stable from last month. Multilevel chronic severe disc and endplate degeneration. Left femoral neck fracture is visible (see left hip series today). No other acute osseous abnormality identified. Non obstructed bowel gas pattern. Aortoiliac calcified atherosclerosis. IMPRESSION: 1. No acute osseous abnormality identified in the lumbar spine. Chronic T12 and L3 compression fractures. 2. Left femoral neck fracture (see left hip series today). 3.  Aortic Atherosclerosis (ICD10-I70.0).  Electronically Signed   By: Genevie Ann M.D.   On: 12/17/2021 06:05   DG Chest 1 View  Result Date: 12/17/2021 CLINICAL DATA:  88 year old female status post fall this morning with left hip pain and deformity. Left femur fracture. EXAM: CHEST  1 VIEW COMPARISON:  Chest radiographs 07/16/2017 and earlier. FINDINGS: Portable AP semi upright view at 0542 hours. Moderate sized chronic gastric hiatal hernia. Stable cardiac size and mediastinal contours. Lung volumes are stable at the upper limits of normal. Skin fold artifact in the bilateral chest. Allowing for portable technique the lungs are clear. No pneumothorax. Visualized tracheal air column is within normal limits. No acute osseous abnormality identified. Negative visible bowel gas. IMPRESSION: No acute cardiopulmonary abnormality. Chronic hiatal hernia. Electronically Signed   By: Genevie Ann M.D.   On: 12/17/2021 06:01   DG HIP UNILAT W OR W/O PELVIS 2-3 VIEWS LEFT  Result Date: 12/17/2021 CLINICAL DATA:  88 year old female status post fall this morning with left hip pain and deformity. EXAM: DG HIP (WITH OR WITHOUT PELVIS) 2-3V LEFT COMPARISON:  CT Abdomen and Pelvis 10/06/2021. FINDINGS: Impacted left femoral neck fracture. Left femoral head remains normally located. Left femur intertrochanteric  segment appears to remain intact. Chronic bone anchors at the pubic rami. No acute pelvis fracture identified. Chronic left inferior pubic ramus fracture. Right hip joint space loss and endplate spurring. Grossly intact proximal right femur. Negative visible bowel gas pattern. IMPRESSION: Impacted left femoral neck fracture. Electronically Signed   By: Genevie Ann M.D.   On: 12/17/2021 05:59    Pertinent labs & imaging results that were available during my care of the patient were reviewed by me and considered in my medical decision making (see MDM for details).  Medications Ordered in ED Medications  HYDROmorphone (DILAUDID) injection 0.5 mg (has no administration in time range)  acetaminophen (TYLENOL) tablet 650 mg (has no administration in time range)  oxyCODONE (Oxy IR/ROXICODONE) immediate release tablet 5 mg (has no administration in time range)  polyethylene glycol (MIRALAX / GLYCOLAX) packet 17 g (has no administration in time range)  HYDROmorphone (DILAUDID) injection 0.5 mg (0.5 mg Intravenous Given 12/17/21 0446)                                                                                                                                     Procedures Procedures  (including critical care time)  Medical Decision Making / ED Course    Complexity of Problem:  Co-morbidities/SDOH that complicate the patient evaluation/care: Noted above in HPI   Patient's presenting problem/concern, DDX, and MDM listed below: Mechanical fall resulting in left hip and lower back pain Exam is concerning for left hip fracture versus dislocation.  Will obtain plain film to assess We will also get plain film of the lumbar spine to assess for any acute fractures  Hospitalization Considered:  yes  Initial Intervention:  IV fentanyl  Complexity of Data:   Cardiac Monitoring: The patient was maintained on a cardiac monitor.   I personally viewed and interpreted the cardiac monitored which  showed an underlying rhythm of NSR   Laboratory Tests ordered listed below with my independent interpretation: CBC with mild leukocytosis.  Stable hemoglobin Metabolic panel without significant electrolyte derangement.  Mild renal insufficiency   Imaging Studies ordered listed below with my independent interpretation: Plain film of the lumbar spine notable for chronic T12 and L3 compression fractures.  No acute fractures noted. Confirmed by radiology X-ray of the left hip also notable for impacted femoral neck fracture without dislocation.      ED Course:    Assessment, Add'l Intervention, and Reassessment: Mechanical fall Resulted in left femoral neck fracture. Preop labs and imaging obtained Will admit to medicine. Dr. Margo Aye agreed to admit. Consulted Ortho.     Final Clinical Impression(s) / ED Diagnoses Final diagnoses:  Fall at nursing home, initial encounter  Left displaced femoral neck fracture (HCC)           This chart was dictated using voice recognition software.  Despite best efforts to proofread,  errors can occur which can change the documentation meaning.    Nira Conn, MD 12/17/21 [phone]

## 2021-12-17 NOTE — H&P (Signed)
History and Physical    Patient: Evelyn [name] TMH:[id number] DOB: [birth year] DOA: 12/17/2021 DOS: the patient was seen and examined on 12/17/2021 PCP: Frederica Kuster, MD  Patient coming from: SNF  Chief Complaint:  Chief Complaint  Patient presents with   Mechanical Fall   HPI: Evelyn [name] is a 88 y.o. female with medical history significant of hyperlipidemia, recurrent UTIs, osteoarthritis, depression, hypertension, anemia who presents from skilled nursing facility after mechanical fall while trying to get out of bed to use the restroom.  She reports slipping while trying to stabilize herself with her walker causing her to fall onto her left side.  She felt immediate severe left hip and lower back pain.  This is worse with movement and palpation.  She denies any head trauma or loss of consciousness.  Denies any headache or neck pain.  No chest pain or upper back pain.  No abdominal pain.  No other extremity pain.  Discussed with ER physician, Dr. Ophelia Charter has been notified and would like to perform surgery later this evening.  To be kept n.p.o. hold blood thinners   Review of Systems: As mentioned in the history of present illness. All other systems reviewed and are negative. Past Medical History:  Diagnosis Date   Allergy    Arthritis    Chronic cystitis    Congenital blindness    right eye   Depression    Gastric ulcer    H/O: hematuria    Hepatitis, unspecified    statin related, resolved quickly   Hyperlipidemia    Legally blind in right eye, as defined in [place]    Syncope and collapse 04/01/2014   UTI (lower urinary tract infection)    HISTORY OF UTI   Past Surgical History:  Procedure Laterality Date   ABDOMINAL HYSTERECTOMY     vaginal   BLADDER SUSPENSION     A-P   CATARACT EXTRACTION W/ INTRAOCULAR LENS IMPLANT Left    ESOPHAGOGASTRODUODENOSCOPY     OPEN REDUCTION INTERNAL FIXATION (ORIF) DISTAL RADIAL FRACTURE Right 03/09/2016   Procedure: OPEN REDUCTION  INTERNAL FIXATION (ORIF) DISTAL RADIAL FRACTURE;  Surgeon: Betha Loa, MD;  Location: [facility];  Service: Orthopedics;  Laterality: Right;   Social History:  reports that she has never smoked. She has never used smokeless tobacco. She reports that she does not drink alcohol and does not use drugs.  No Known Allergies  Family History  Problem Relation Age of Onset   Cancer Neg Hx        Colon cancer   Stroke Neg Hx    Heart disease Neg Hx    Hyperlipidemia Neg Hx    Hypertension Neg Hx    Kidney disease Neg Hx    Colon polyps Neg Hx    Colon cancer Neg Hx     Prior to Admission medications   Medication Sig Start Date End Date Taking? Authorizing Provider  acetaminophen (TYLENOL) 500 MG tablet Take 500 mg by mouth every 6 (six) hours as needed.   Yes [provider]  cholecalciferol (VITAMIN D3) 25 MCG (1000 UNIT) tablet Take 1,000 Units by mouth daily.   Yes [provider]  docusate sodium (COLACE) 100 MG capsule Take 100 mg by mouth in the morning.   Yes [provider]  FLUoxetine (PROZAC) 20 MG capsule Take 20 mg by mouth daily.   Yes [provider]  hydrochlorothiazide (MICROZIDE) 12.5 MG capsule Take 12.5 mg by mouth daily. 12/09/21  Yes [provider]  oxybutynin 2.5 MG TABS Take 2.5 mg by mouth 3 (three) times daily. 11/10/21  Yes Leatha Gilding, MD  pantoprazole (PROTONIX) 40 MG tablet Take 1 tablet (40 mg total) by mouth daily before breakfast. 11/10/21  Yes Gherghe, Daylene Katayama, MD  polyethylene glycol (MIRALAX / GLYCOLAX) 17 g packet Take 17 g by mouth daily.   Yes [provider]  zolpidem (AMBIEN) 10 MG tablet Take 1/2 tablet by mouth once daily at bedtime as needed for sleep. Patient taking differently: Take 5 mg by mouth at bedtime. 07/01/21  Yes Frederica Kuster, MD  amLODipine (NORVASC) 10 MG tablet Take 1 tablet (10 mg total) by mouth daily. Patient not taking: Reported on 12/17/2021 11/10/21    Leatha Gilding, MD  amLODipine (NORVASC) 2.5 MG tablet Take 2.5 mg by mouth daily. Patient not taking: Reported on 12/17/2021 11/23/21   [provider]  amLODipine (NORVASC) 5 MG tablet Take 5 mg by mouth daily. Patient not taking: Reported on 12/17/2021 11/18/21   [provider]  furosemide (LASIX) 40 MG tablet Take 40 mg by mouth daily. Patient not taking: Reported on 12/17/2021 11/23/21   [provider]    Physical Exam: Constitutional:      General: She is not in acute distress.    Appearance: She is well-developed. She is not diaphoretic.  HENT:     Head: Normocephalic and atraumatic.     Right Ear: External ear normal.     Left Ear: External ear normal.     Nose: Nose normal.  Eyes:     General: No scleral icterus.       Right eye: No discharge.        Left eye: No discharge.     Conjunctiva/sclera: Conjunctivae normal.     Pupils: Pupils are equal, round, and reactive to light.  Cardiovascular:     Rate and Rhythm: Normal rate and regular rhythm.     Pulses:   Intact bilaterally       Heart sounds: Normal heart sounds. No murmur heard.    No friction rub. No gallop.  Pulmonary:     Effort: Pulmonary effort is normal. No respiratory distress.     Breath sounds: Normal breath sounds. No stridor. No wheezing.  Abdominal:     General: There is no distension.     Palpations: Abdomen is soft.     Tenderness: There is no abdominal tenderness.  Musculoskeletal:     Cervical back: Normal range of motion and neck supple. No bony tenderness.     Thoracic back: No bony tenderness.     Lumbar back: Tenderness present. No bony tenderness.     Left hip: Diminished range of motion, tenderness noted     Right foot: Normal pulse.     Left foot: Normal pulse.   Skin:    General: Skin is warm and dry.     Findings: No erythema or rash.  Neurological:     Mental Status: She is alert and oriented to person, place, and time.    Vitals:   12/17/21 0333  12/17/21 0336 12/17/21 0738  BP: (!) 172/84  (!) 152/61  Pulse:  77 73  Resp:  17 16  Temp:   98.3 F (36.8 C)  TempSrc:   Oral  SpO2:  100% 99%    Data Reviewed:  There are no new results to review at this time.  Assessment and Plan: -Status post fall  -  Left femoral neck fracture-plan for surgical intervention this evening.  Dr. Ophelia Charter.  Keep n.p.o., hold any blood thinners/DVT prophylaxis.  Pain management, IV hydration. Check urine given that patient has recurrent urinary tract infections.   Chronic co morbidities-hypertension, GERD, chronic mood disorders.  Resume home medications as appropriate, monitor clinically.    Advance Care Planning:   Code Status: Prior   Consults: Ortho  Family Communication:   Severity of Illness: The appropriate patient status for this patient is INPATIENT. Inpatient status is judged to be reasonable and necessary in order to provide the required intensity of service to ensure the patient's safety. The patient's presenting symptoms, physical exam findings, and initial radiographic and laboratory data in the context of their chronic comorbidities is felt to place them at high risk for further clinical deterioration. Furthermore, it is not anticipated that the patient will be medically stable for discharge from the hospital within 2 midnights of admission.   * I certify that at the point of admission it is my clinical judgment that the patient will require inpatient hospital care spanning beyond 2 midnights from the point of admission due to high intensity of service, high risk for further deterioration and high frequency of surveillance required.*  Author: Baldomero Lamy, MD 12/17/2021 9:10 AM  For on call review [email or web address].

## 2021-12-18 ENCOUNTER — Other Ambulatory Visit: Payer: Self-pay

## 2021-12-18 ENCOUNTER — Encounter (HOSPITAL_COMMUNITY): Payer: Self-pay | Admitting: Orthopaedic Surgery

## 2021-12-18 DIAGNOSIS — R8281 Pyuria: Secondary | ICD-10-CM

## 2021-12-18 DIAGNOSIS — R509 Fever, unspecified: Secondary | ICD-10-CM

## 2021-12-18 DIAGNOSIS — D539 Nutritional anemia, unspecified: Secondary | ICD-10-CM

## 2021-12-18 DIAGNOSIS — E440 Moderate protein-calorie malnutrition: Secondary | ICD-10-CM | POA: Insufficient documentation

## 2021-12-18 DIAGNOSIS — S72002A Fracture of unspecified part of neck of left femur, initial encounter for closed fracture: Secondary | ICD-10-CM | POA: Diagnosis not present

## 2021-12-18 LAB — BASIC METABOLIC PANEL
Anion gap: 6 (ref 5–15)
BUN: 26 mg/dL — ABNORMAL HIGH (ref 8–23)
CO2: 20 mmol/L — ABNORMAL LOW (ref 22–32)
Calcium: 8.4 mg/dL — ABNORMAL LOW (ref 8.9–10.3)
Chloride: 109 mmol/L (ref 98–111)
Creatinine, Ser: 0.84 mg/dL (ref 0.44–1.00)
GFR, Estimated: >60 mL/min (ref >60)
Glucose, Bld: 159 mg/dL — ABNORMAL HIGH (ref 70–99)
Potassium: 4.2 mmol/L (ref 3.5–5.1)
Sodium: 135 mmol/L (ref 135–145)

## 2021-12-18 LAB — URINALYSIS, ROUTINE W REFLEX MICROSCOPIC
Bacteria, UA: NONE SEEN
Bilirubin Urine: NEGATIVE
Glucose, UA: NEGATIVE mg/dL
Ketones, ur: NEGATIVE mg/dL
Nitrite: NEGATIVE
Protein, ur: 30 mg/dL — AB
Specific Gravity, Urine: 1.015 (ref 1.005–1.030)
WBC, UA: >50 WBC/hpf — ABNORMAL HIGH (ref 0–5)
pH: 5 (ref 5.0–8.0)

## 2021-12-18 LAB — CBC
HCT: 32 % — ABNORMAL LOW (ref 36.0–46.0)
Hemoglobin: 10.1 g/dL — ABNORMAL LOW (ref 12.0–15.0)
MCH: 31.8 pg (ref 26.0–34.0)
MCHC: 31.6 g/dL (ref 30.0–36.0)
MCV: 100.6 fL — ABNORMAL HIGH (ref 80.0–100.0)
Platelets: 167 10*3/uL (ref 150–400)
RBC: 3.18 MIL/uL — ABNORMAL LOW (ref 3.87–5.11)
RDW: 14.6 % (ref 11.5–15.5)
WBC: 11.3 10*3/uL — ABNORMAL HIGH (ref 4.0–10.5)
nRBC: 0 % (ref 0.0–0.2)

## 2021-12-18 LAB — VITAMIN B12: Vitamin B-12: 989 pg/mL — ABNORMAL HIGH (ref 180–914)

## 2021-12-18 MED ORDER — ENSURE ENLIVE PO LIQD
237.0000 mL | Freq: Two times a day (BID) | ORAL | Status: DC
  Administered 2021-12-18 – 2021-12-20 (×6): 237 mL via ORAL

## 2021-12-18 MED ORDER — PANTOPRAZOLE SODIUM 40 MG PO TBEC
40.0000 mg | DELAYED_RELEASE_TABLET | Freq: Every day | ORAL | Status: DC
  Administered 2021-12-19 – 2021-12-21 (×3): 40 mg via ORAL
  Filled 2021-12-18 (×3): qty 1

## 2021-12-18 MED ORDER — ZOLPIDEM TARTRATE 5 MG PO TABS
5.0000 mg | ORAL_TABLET | Freq: Every day | ORAL | Status: DC
  Administered 2021-12-18: 5 mg via ORAL
  Filled 2021-12-18: qty 1

## 2021-12-18 MED ORDER — OXYBUTYNIN CHLORIDE 5 MG PO TABS
2.5000 mg | ORAL_TABLET | Freq: Three times a day (TID) | ORAL | Status: DC
  Administered 2021-12-18 – 2021-12-21 (×9): 2.5 mg via ORAL
  Filled 2021-12-18 (×9): qty 1

## 2021-12-18 MED ORDER — POLYETHYLENE GLYCOL 3350 17 G PO PACK
17.0000 g | PACK | Freq: Every day | ORAL | Status: DC
  Administered 2021-12-19 – 2021-12-20 (×2): 17 g via ORAL
  Filled 2021-12-18 (×4): qty 1

## 2021-12-18 MED ORDER — FLUOXETINE HCL 20 MG PO CAPS
20.0000 mg | ORAL_CAPSULE | Freq: Every day | ORAL | Status: DC
  Administered 2021-12-18 – 2021-12-21 (×4): 20 mg via ORAL
  Filled 2021-12-18 (×4): qty 1

## 2021-12-18 MED ORDER — HYDROCHLOROTHIAZIDE 12.5 MG PO TABS
12.5000 mg | ORAL_TABLET | Freq: Every day | ORAL | Status: DC
  Administered 2021-12-19 – 2021-12-21 (×3): 12.5 mg via ORAL
  Filled 2021-12-18 (×4): qty 1

## 2021-12-18 MED ORDER — SODIUM CHLORIDE 0.9 % IV BOLUS
500.0000 mL | Freq: Once | INTRAVENOUS | Status: AC
  Administered 2021-12-18: 500 mL via INTRAVENOUS

## 2021-12-18 MED ORDER — VITAMIN D 25 MCG (1000 UNIT) PO TABS
1000.0000 [IU] | ORAL_TABLET | Freq: Every day | ORAL | Status: DC
Start: 2021-12-18 — End: 2021-12-21
  Administered 2021-12-18 – 2021-12-21 (×4): 1000 [IU] via ORAL
  Filled 2021-12-18 (×4): qty 1

## 2021-12-18 MED ORDER — ASPIRIN 325 MG PO TBEC: 325.0000 mg | DELAYED_RELEASE_TABLET | Freq: Every day | ORAL | 0 refills | Status: DC

## 2021-12-18 MED ORDER — ADULT MULTIVITAMIN W/MINERALS CH
1.0000 | ORAL_TABLET | Freq: Every day | ORAL | Status: DC
  Administered 2021-12-18 – 2021-12-21 (×4): 1 via ORAL
  Filled 2021-12-18 (×4): qty 1

## 2021-12-18 MED ORDER — HYDROCODONE-ACETAMINOPHEN 5-325 MG PO TABS: 1.0000 | ORAL_TABLET | Freq: Four times a day (QID) | ORAL | 0 refills | Status: DC | PRN

## 2021-12-18 MED ORDER — SODIUM CHLORIDE 0.9 % IV SOLN
1.0000 g | Freq: Every day | INTRAVENOUS | Status: DC
  Administered 2021-12-18 – 2021-12-20 (×3): 1 g via INTRAVENOUS
  Filled 2021-12-18 (×3): qty 10

## 2021-12-18 MED ORDER — FOLIC ACID 1 MG PO TABS
1.0000 mg | ORAL_TABLET | Freq: Every day | ORAL | Status: DC
  Administered 2021-12-18 – 2021-12-21 (×4): 1 mg via ORAL
  Filled 2021-12-18 (×4): qty 1

## 2021-12-18 NOTE — Progress Notes (Signed)
Subjective: Doing well.  Pain controlled.  No complaints.  Daughter in room.     Objective: Vital signs in last 24 hours: Temp:  [97.5 F (36.4 C)-101.2 F (38.4 C)] 98.3 F (36.8 C) (06/30 0958) Pulse Rate:  [73-93] 78 (06/30 0958) Resp:  [14-18] 17 (06/30 0958) BP: (100-178)/(47-80) 100/47 (06/30 0958) SpO2:  [88 %-99 %] 99 % (06/30 0958)  Intake/Output from previous day: 06/29 0701 - 06/30 0700 In: 2505 [P.O.:480; I.V.:1725; IV Piggyback:300] Out: 2810 [Urine:2650; Blood:160] Intake/Output this shift: Total I/O In: 120 [P.O.:120] Out: 250 [Urine:250]  Recent Labs    12/17/21 0430 12/18/21 0316  HGB 10.4* 10.1*   Recent Labs    12/17/21 0430 12/18/21 0316  WBC 11.7* 11.3*  RBC 3.23* 3.18*  HCT 31.9* 32.0*  PLT 196 167   Recent Labs    12/17/21 0430 12/18/21 0316  NA 139 135  K 4.3 4.2  CL 112* 109  CO2 17* 20*  BUN 36* 26*  CREATININE 1.12* 0.84  GLUCOSE 121* 159*  CALCIUM 9.0 8.4*   Recent Labs    12/17/21 0742  INR 1.4*    Exam Extremely pleasant female, alert and oriented, NAD.  Wound looks good, staples intact.  No drainage.     Assessment/Plan: -scripts on chart for norco and aspirin (dvt prophylaxis) -stable from ortho standpoint.  Transfer back to SNF when cleared by hospitalist service.  -continue present care.      Zonia Kief 12/18/2021, 12:47 PM

## 2021-12-18 NOTE — Progress Notes (Signed)
Initial Nutrition Assessment  DOCUMENTATION CODES:   Underweight, Non-severe (moderate) malnutrition in context of chronic illness  INTERVENTION:   -Ensure Plus High Protein po BID, each supplement provides 350 kcal and 20 grams of protein.   -Multivitamin with minerals daily  NUTRITION DIAGNOSIS:   Moderate Malnutrition related to chronic illness as evidenced by moderate fat depletion, severe muscle depletion.  GOAL:   Patient will meet greater than or equal to 90% of their needs  MONITOR:   PO intake, Supplement acceptance, Labs, Weight trends, I & O's  REASON FOR ASSESSMENT:   Consult Hip fracture protocol  ASSESSMENT:   88 y.o.f w/ hyperlipidemia, recurrent UTIs, osteoarthritis, depression, hypertension, anemia who presented from SNF after mechanical fall while trying to get out of bed to use the restroom, found to have left femoral neck fracture displaced.  Orthopedic was consulted and patient was admitted.  Patient sitting in chair, just received lunch meal of a ham and cheese sandwich. Pt states she feels hungry today. States her appetite hasn't been good. She is of advanced age.  Pt will typically eat 2 meals a day, late breakfast and dinner. Eats meats and dairy. Denies issues chewing or swallowing.  Pt agreeable to drinking Ensure, drank 1 this morning.   Per weight records, weight has been stable. Pt states her weight is usually around 105 lbs.  Medications: Colace, folic acid  Labs reviewed.  NUTRITION - FOCUSED PHYSICAL EXAM:  Flowsheet Row Most Recent Value  Orbital Region Moderate depletion  Upper Arm Region Severe depletion  Thoracic and Lumbar Region Unable to assess  Buccal Region Moderate depletion  Temple Region Severe depletion  Clavicle Bone Region Moderate depletion  Clavicle and Acromion Bone Region Moderate depletion  Scapular Bone Region Moderate depletion  Dorsal Hand Severe depletion  Patellar Region Unable to assess  Anterior Thigh  Region Unable to assess  Posterior Calf Region Unable to assess  Edema (RD Assessment) None  Hair Reviewed  Eyes Reviewed  [wears glasses]  Mouth Reviewed  Skin Reviewed  Nails Reviewed       Diet Order:   Diet Order             Diet regular Room service appropriate? Yes; Fluid consistency: Thin  Diet effective now                   EDUCATION NEEDS:   No education needs have been identified at this time  Skin:  Skin Assessment: Skin Integrity Issues: Skin Integrity Issues:: Incisions Incisions: 6/29 left hip  Last BM:  6/28  Height:   Ht Readings from Last 1 Encounters:  12/17/21 5\' 5"  (1.651 m)    Weight:   Wt Readings from Last 1 Encounters:  12/17/21 47.7 kg    BMI:  Body mass index is 17.49 kg/m.  Estimated Nutritional Needs:   Kcal:  1400-1600  Protein:  65-80g  Fluid:  1.6L/day  12/19/21, MS, RD, LDN Inpatient Clinical Dietitian Contact information available via [employer]

## 2021-12-18 NOTE — Progress Notes (Signed)
PROGRESS NOTE Evelyn [name]  BMW:[id number] DOB: 06-Jun-1925 DOA: 12/17/2021 PCP: Frederica Kuster, MD   Brief Narrative/Hospital Course: 88 y.o.f w/ hyperlipidemia, recurrent UTIs, osteoarthritis, depression, hypertension, anemia who presented from SNF after mechanical fall while trying to get out of bed to use the restroom, found to have left femoral neck fracture displaced.  Orthopedic was consulted and patient was admitted. In  ED- UA wbc 11-20, leukocytes small and nitrite positive, labs with chronic anemia hemoglobin 10 g, mild leukocytosis 11.7, creatinine 1.1 with bicarb 17.  Patient underwent left hip hemiarthroplasty by Dr. Ophelia Charter.          Subjective: Seen and examined.  Pleasant lady not in pain resting comfortably no new complaints left femoral surgical site with dressing in place   Assessment and Plan: Principal Problem:   Fracture of femoral neck, left (HCC) Active Problems:   Depression with somatization   Chronic idiopathic constipation   Cognitive impairment   HTN (hypertension)   Pyuria   Fever   Macrocytic anemia   Fracture of femoral neck, left: Due to mechanical fall,s/p left hip hemiarthroplasty.  Postop DVT prophylaxis w/ aspirin  325 mg daily along with pain medication, bowel regimen PT OT as per orthopedic surgery.  HTN: BP stable holding HCTZ Lasix and amlodipine for now  Pyuria/Fever 101.2 at 5 PM 6/29-pyuria noted on admission ? UTI-repeat UA previous history of pansensitive E. coli 5/23.  Low threshold for antibiotics if she has recurrent fever  Mild AKI with metabolic acidosis bicarb improving.  Continue gentle hydration encourage oral intake creatinine improved to 0.8 from 1.1  Microcytic anemia hemoglobin at 10 g range.  Monitor check B12, and folate Recent Labs  Lab 12/17/21 0430 12/18/21 0316  HGB 10.4* 10.1*  HCT 31.9* 32.0*    Depression with somatization Cognitive impairment: Continue fall precaution delirium precaution PT  OT.  Chronic idiopathic constipation: cont Bowel regimen  Low BMI at 17, consult dietitian  DVT prophylaxis:asa 325 mg daily- SCDs Start: 12/17/21 2104 SCDs Start: 12/17/21 0926 Code Status:   Code Status: Full Code Family Communication: plan of care discussed with patient at bedside. Patient status is: Inpatient because of hip surgery and postop care Level of care: Med-Surg   Dispo: The patient is from: Skilled nursing facility            Anticipated disposition: Skilled nursing facility in 1 day or so once cleared by orthopedics  Mobility Assessment (last 72 hours)     Mobility Assessment     Row Name 12/17/21 2058 12/17/21 1132         Does patient have an order for bedrest or is patient medically unstable No - Continue assessment Yes- Bedfast (Level 1) - Complete      What is the highest level of mobility based on the progressive mobility assessment? Level 3 (Stands with assist) - Balance while standing  and cannot march in place --      Is the above level different from baseline mobility prior to current illness? Yes - Recommend PT order --                Objective: Vitals last 24 hrs: Vitals:   12/17/21 2155 12/17/21 2255 12/18/21 0109 12/18/21 0528  BP: (!) 142/69 140/60 (!) 144/77 (!) 152/71  Pulse: 84 83 79 73  Resp: 18 18  18   Temp: 98 F (36.7 C) 98.2 F (36.8 C) 98.3 F (36.8 C) (!) 97.5 F (36.4 C)  TempSrc: Oral  Oral Oral Oral  SpO2: 94% 94% 95% 96%  Weight:      Height:       Weight change:   Physical Examination: General exam: alert awake,older than stated age, weak appearing. HEENT:Oral mucosa moist, Ear/Nose WNL grossly, dentition normal. Respiratory system: bilaterally diminished BS, no use of accessory muscle Cardiovascular system: S1 & S2 +, No JVD. Gastrointestinal system: Abdomen soft,NT,ND, BS+ Nervous System:Alert, awake, moving extremities and grossly nonfocal Extremities: LE edema neg, left hip surgical site with dressing in place  distal peripheral pulses palpable.  Skin: No rashes,no icterus. MSK: Normal muscle bulk,tone, power  Medications reviewed:  Scheduled Meds:  acetaminophen  500 mg Oral Q6H   aspirin EC  325 mg Oral Q breakfast   docusate sodium  100 mg Oral BID   feeding supplement  237 mL Oral BID BM   fentaNYL (SUBLIMAZE) injection  50-100 mcg Intravenous UD   folic acid  1 mg Oral Daily   Continuous Infusions:  sodium chloride 75 mL/hr at 12/18/21 0556      Diet Order             Diet regular Room service appropriate? Yes; Fluid consistency: Thin  Diet effective now                            Intake/Output Summary (Last 24 hours) at 12/18/2021 1000 Last data filed at 12/18/2021 0556 Gross per 24 hour  Intake 2505 ml  Output 2810 ml  Net -305 ml   Net IO Since Admission: -305 mL [12/18/21 1000]  Wt Readings from Last 3 Encounters:  12/17/21 47.7 kg  12/10/21 47.7 kg  11/05/21 47.5 kg     Unresulted Labs (From admission, onward)     Start     Ordered   12/19/21 0500  Basic metabolic panel  Daily,   R      12/18/21 0746   12/18/21 0747  Vitamin B12  Add-on,   AD        12/18/21 0746   12/18/21 0746  Urinalysis, Routine w reflex microscopic Urine, Clean Catch  Once,   R        12/18/21 0746   12/18/21 0746  Urine Culture  (Urine Culture)  Add-on,   AD       Question:  Indication  Answer:  Dysuria   12/18/21 0746   12/18/21 0500  CBC  Daily,   R     Comments: For 3 days.    12/17/21 2104   12/18/21 0500  Basic metabolic panel  Daily,   R     Comments: For 2 days .    12/17/21 2104          Data Reviewed: I have personally reviewed following labs and imaging studies CBC: Recent Labs  Lab 12/17/21 0430 12/18/21 0316  WBC 11.7* 11.3*  NEUTROABS 10.2*  --   HGB 10.4* 10.1*  HCT 31.9* 32.0*  MCV 98.8 100.6*  PLT 196 167   Basic Metabolic Panel: Recent Labs  Lab 12/17/21 0430 12/18/21 0316  NA 139 135  K 4.3 4.2  CL 112* 109  CO2 17* 20*  GLUCOSE  121* 159*  BUN 36* 26*  CREATININE 1.12* 0.84  CALCIUM 9.0 8.4*   GFR: Estimated Creatinine Clearance: 30.2 mL/min (by C-G formula based on SCr of 0.84 mg/dL). Liver Function Tests: No results for input(s): "AST", "ALT", "ALKPHOS", "BILITOT", "PROT", "ALBUMIN" in the last 168  hours. No results for input(s): "LIPASE", "AMYLASE" in the last 168 hours. No results for input(s): "AMMONIA" in the last 168 hours. Coagulation Profile: Recent Labs  Lab 12/17/21 0742  INR 1.4*   BNP (last 3 results) No results for input(s): "PROBNP" in the last 8760 hours. HbA1C: No results for input(s): "HGBA1C" in the last 72 hours. CBG: No results for input(s): "GLUCAP" in the last 168 hours. Lipid Profile: No results for input(s): "CHOL", "HDL", "LDLCALC", "TRIG", "CHOLHDL", "LDLDIRECT" in the last 72 hours. Thyroid Function Tests: No results for input(s): "TSH", "T4TOTAL", "FREET4", "T3FREE", "THYROIDAB" in the last 72 hours. Sepsis Labs: No results for input(s): "PROCALCITON", "LATICACIDVEN" in the last 168 hours.  Recent Results (from the past 240 hour(s))  MRSA Next Gen by PCR, Nasal     Status: None   Collection Time: 12/17/21 11:54 AM   Specimen: Nasal Mucosa; Nasal Swab  Result Value Ref Range Status   MRSA by PCR Next Gen NOT DETECTED NOT DETECTED Final    Comment: (NOTE) The GeneXpert MRSA Assay (FDA approved for NASAL specimens only), is one component of a comprehensive MRSA colonization surveillance program. It is not intended to diagnose MRSA infection nor to guide or monitor treatment for MRSA infections. Test performance is not FDA approved in patients less than 24 years old. Performed at [facility], [address] [place], [place] [address]     Antimicrobials: Anti-infectives (From admission, onward)    Start     Dose/Rate Route Frequency Ordered Stop   12/18/21 0600  ceFAZolin (ANCEF) IVPB 2g/100 mL premix        2 g 200 mL/hr over 30 Minutes  Intravenous On call to O.R. 12/17/21 1124 12/17/21 1847      Culture/Microbiology    Component Value Date/Time   SDES  11/05/2021 2350    BLOOD RIGHT ANTECUBITAL Performed at [facility], [address], [place], [place] [address]    SPECREQUEST  11/05/2021 2350    BOTTLES DRAWN AEROBIC ONLY Blood Culture adequate volume Performed at [facility], [address] [place], [place] [address]    CULT  11/05/2021 2350    NO GROWTH 5 DAYS Performed at [facility], [address] [place], [place] [address]    REPTSTATUS 11/11/2021 FINAL 11/05/2021 2350    Other culture-see note  Radiology Studies: Pelvis Portable  Result Date: 12/17/2021 CLINICAL DATA:  Left hip arthroplasty EXAM: PORTABLE PELVIS 1-2 VIEWS COMPARISON:  None Available. FINDINGS: Single orthopedic view of the pelvis demonstrates surgical changes of left hip bipolar hemiarthroplasty. Arthroplasty component overlies its expected position. No unexpected fracture or dislocation. Severe right hip degenerative arthritis is incidentally noted. Suture anchors related to probable bladder suspension noted within the pubic symphysis. Extensive gas surrounding the left hip is likely postsurgical in nature. Surgical skin staples are noted superolateral to the left hip. IMPRESSION: Status post left hip bipolar hemiarthroplasty. No unexpected fracture or dislocation. Electronically Signed   By: Helyn Numbers M.D.   On: 12/17/2021 20:51   DG Lumbar Spine 2-3 Views  Result Date: 12/17/2021 CLINICAL DATA:  88 year old female status post fall this morning with left hip pain and deformity. EXAM: LUMBAR SPINE - 2-3 VIEW COMPARISON:  CT Abdomen and Pelvis 11/05/2021. FINDINGS: AP and cross-table lateral views. Osteopenia. Chronic dextroconvex thoracic and levoconvex lumbar scoliosis. Chronically exaggerated lumbar lordosis. Osteopenia. Chronic L3 and T12 compression fractures appear stable from last  month. Multilevel chronic severe disc and endplate degeneration.  Left femoral neck fracture is visible (see left hip series today). No other acute osseous abnormality identified. Non obstructed bowel gas pattern. Aortoiliac calcified atherosclerosis. IMPRESSION: 1. No acute osseous abnormality identified in the lumbar spine. Chronic T12 and L3 compression fractures. 2. Left femoral neck fracture (see left hip series today). 3.  Aortic Atherosclerosis (ICD10-I70.0). Electronically Signed   By: Odessa Fleming M.D.   On: 12/17/2021 06:05   DG Chest 1 View  Result Date: 12/17/2021 CLINICAL DATA:  88 year old female status post fall this morning with left hip pain and deformity. Left femur fracture. EXAM: CHEST  1 VIEW COMPARISON:  Chest radiographs 07/16/2017 and earlier. FINDINGS: Portable AP semi upright view at 0542 hours. Moderate sized chronic gastric hiatal hernia. Stable cardiac size and mediastinal contours. Lung volumes are stable at the upper limits of normal. Skin fold artifact in the bilateral chest. Allowing for portable technique the lungs are clear. No pneumothorax. Visualized tracheal air column is within normal limits. No acute osseous abnormality identified. Negative visible bowel gas. IMPRESSION: No acute cardiopulmonary abnormality. Chronic hiatal hernia. Electronically Signed   By: Odessa Fleming M.D.   On: 12/17/2021 06:01   DG HIP UNILAT W OR W/O PELVIS 2-3 VIEWS LEFT  Result Date: 12/17/2021 CLINICAL DATA:  88 year old female status post fall this morning with left hip pain and deformity. EXAM: DG HIP (WITH OR WITHOUT PELVIS) 2-3V LEFT COMPARISON:  CT Abdomen and Pelvis 10/06/2021. FINDINGS: Impacted left femoral neck fracture. Left femoral head remains normally located. Left femur intertrochanteric segment appears to remain intact. Chronic bone anchors at the pubic rami. No acute pelvis fracture identified. Chronic left inferior pubic ramus fracture. Right hip joint space loss and endplate spurring.  Grossly intact proximal right femur. Negative visible bowel gas pattern. IMPRESSION: Impacted left femoral neck fracture. Electronically Signed   By: Odessa Fleming M.D.   On: 12/17/2021 05:59     LOS: 1 day   Lanae Boast, MD [facility]  12/18/2021, 10:00 AM

## 2021-12-18 NOTE — Progress Notes (Signed)
Rn attempting to clarify tele order with provider on call. Pt has not been on tele for [place] and her hr remains stable. Rn contacting provider to see if we can d/c this order. Pt remains stable. Rn will continue to monitor.

## 2021-12-18 NOTE — [place] FL2 (Signed)
[place] MEDICAID FL2 LEVEL OF CARE SCREENING TOOL     IDENTIFICATION  Patient Name: Evelyn [name] Birthdate: 11-18-1924 Sex: female Admission Date (Current Location): 12/17/2021  [facility] and [place] Number:  Producer, television/film/video and Address:  [facility],  [address], [place] [address]      Provider Number: [id number]  Attending Physician Name and Address:  Lanae Boast, MD  Relative Name and Phone Number:  Delories Heinz (daughter) Ph: [phone]    Current Level of Care: Hospital Recommended Level of Care: Skilled Nursing Facility Prior Approval Number:    Date Approved/Denied:   PASRR Number: Pending  Discharge Plan: SNF    Current Diagnoses: Patient Active Problem List   Diagnosis Date Noted   Pyuria 12/18/2021   Fever 12/18/2021   Macrocytic anemia 12/18/2021   Fracture of femoral neck, left (HCC) 12/17/2021   Hip fracture (HCC) 12/17/2021   Delirium 11/09/2021   HTN (hypertension) 11/08/2021   Hypokalemia 11/08/2021   Compression fracture of body of thoracic vertebra (HCC) 11/06/2021   Acute anemia 11/06/2021   Cognitive impairment 11/06/2021   Lactic acidosis 11/06/2021   Black stools    Heme + stool    Peptic ulcer disease    Acute metabolic encephalopathy 11/05/2021   Closed wedge compression fracture of T12 vertebra (HCC) 11/24/2016   Chronic midline low back pain 11/18/2016   Chronic renal disease, stage 3, moderately decreased glomerular filtration rate (GFR) between 30-59 mL/min/1.73 square meter (HCC) 11/18/2016   IBS (irritable bowel syndrome)    UTI (urinary tract infection)    Routine general medical examination at a health care facility 05/03/2013   Chronic idiopathic constipation 03/31/2012   Other screening mammogram 03/24/2011   Depression with somatization 03/06/2010   Insomnia 03/08/2008   Hyperlipidemia with target LDL less than 130 02/21/2007    Orientation RESPIRATION BLADDER Height & Weight     Self, Time,  Situation, Place  O2 (2-3L/min) Continent Weight: 105 lb 1.6 oz (47.7 kg) Height:  5\' 5"  (165.1 cm)  BEHAVIORAL SYMPTOMS/MOOD NEUROLOGICAL BOWEL NUTRITION STATUS   (N/A)  (N/A) Continent Diet (Regular diet)  AMBULATORY STATUS COMMUNICATION OF NEEDS Skin   Limited Assist Verbally Surgical wounds, Other (Comment) (Tears: right arm)                       Personal Care Assistance Level of Assistance  Bathing, Feeding, Dressing Bathing Assistance: Maximum assistance Feeding assistance: Independent Dressing Assistance: Maximum assistance     Functional Limitations Info  Sight, Hearing, Speech Sight Info: Impaired Hearing Info: Adequate Speech Info: Adequate    SPECIAL CARE FACTORS FREQUENCY  PT (By licensed PT), OT (By licensed OT)     PT Frequency: 5x's/week OT Frequency: 5x's/week            Contractures Contractures Info: Not present    Additional Factors Info  Code Status, Allergies Code Status Info: Full Allergies Info: NKA           Current Medications (12/18/2021):  This is the current hospital active medication list Current Facility-Administered Medications  Medication Dose Route Frequency Provider Last Rate Last Admin   acetaminophen (TYLENOL) tablet 325-650 mg  325-650 mg Oral Q6H PRN 12/20/2021, MD   325 mg at 12/18/21 12/20/21   acetaminophen (TYLENOL) tablet 500 mg  500 mg Oral Q6H 2725, MD       acetaminophen (TYLENOL) tablet 650 mg  650 mg Oral Q6H PRN Dibia,  Elgie Collard, MD       aspirin EC tablet 325 mg  325 mg Oral Q breakfast Eldred Manges, MD   325 mg at 12/18/21 1610   docusate sodium (COLACE) capsule 100 mg  100 mg Oral BID Eldred Manges, MD   100 mg at 12/18/21 1015   feeding supplement (ENSURE ENLIVE / ENSURE PLUS) liquid 237 mL  237 mL Oral BID BM Kc, Ramesh, MD   237 mL at 12/18/21 1317   fentaNYL (SUBLIMAZE) injection 50-100 mcg  50-100 mcg Intravenous UD Collene Schlichter, MD   50 mcg at 12/17/21 1048   folic acid (FOLVITE) tablet 1  mg  1 mg Oral Daily Kc, Ramesh, MD   1 mg at 12/18/21 1015   HYDROcodone-acetaminophen (NORCO/VICODIN) 5-325 MG per tablet 1-2 tablet  1-2 tablet Oral Q4H PRN Eldred Manges, MD   1 tablet at 12/18/21 9604   menthol-cetylpyridinium (CEPACOL) lozenge 3 mg  1 lozenge Oral PRN Eldred Manges, MD       Or   phenol (CHLORASEPTIC) mouth spray 1 spray  1 spray Mouth/Throat PRN Eldred Manges, MD   1 spray at 12/18/21 0111   metoCLOPramide (REGLAN) tablet 5-10 mg  5-10 mg Oral Q8H PRN Eldred Manges, MD       Or   metoCLOPramide (REGLAN) injection 5-10 mg  5-10 mg Intravenous Q8H PRN Eldred Manges, MD       multivitamin with minerals tablet 1 tablet  1 tablet Oral Daily Kc, Ramesh, MD   1 tablet at 12/18/21 1317   ondansetron (ZOFRAN) tablet 4 mg  4 mg Oral Q6H PRN Eldred Manges, MD       Or   ondansetron [facility]) injection 4 mg  4 mg Intravenous Q6H PRN Eldred Manges, MD       polyethylene glycol (MIRALAX / GLYCOLAX) packet 17 g  17 g Oral Daily PRN Dibia, Elgie Collard, MD         Discharge Medications: Please see discharge summary for a list of discharge medications.  Relevant Imaging Results:  Relevant Lab Results:   Additional Information SSN: [id number]  Ewing Schlein, LCSW

## 2021-12-18 NOTE — Progress Notes (Signed)
Orthopedic Tech Progress Note Patient Details:  Evelyn [name] 08-17-1924 [id number]  Ortho Devices Type of Ortho Device: Knee Immobilizer Ortho Device/Splint Interventions: Application   Post Interventions Patient Tolerated: Well Instructions Provided: Care of device, Adjustment of device  Saul Fordyce 12/18/2021, 8:54 AM

## 2021-12-18 NOTE — Hospital Course (Addendum)
88 y.o.f w/ hyperlipidemia, recurrent UTIs, osteoarthritis, depression, hypertension, anemia who presented from SNF after mechanical fall while trying to get out of bed to use the restroom, found to have left femoral neck fracture displaced.  Orthopedic was consulted and patient was admitted. In  ED- UA wbc 11-20, leukocytes small and nitrite positive, labs with chronic anemia hemoglobin 10 g, mild leukocytosis 11.7, creatinine 1.1 with bicarb 17.  Patient underwent left hip hemiarthroplasty by Dr. Ophelia Charter. Plan for return to skilled nursing facility once cleared by PT and PSAAR level 2 received.  Urine culture came back with E. coli.  Transitioned to oral antibiotics.  Remains hemodynamically stable on room air.  At this time she is medically stable and is being discharged to skilled nursing facility.

## 2021-12-18 NOTE — Evaluation (Signed)
Physical Therapy Evaluation Patient Details Name: Evelyn [name] MRN: [id number] DOB: 06/20/1925 Today's Date: 12/18/2021  History of Present Illness  Pt admitted from [facility] (rehab for recovery from recent fall with compression fx).  Pt s/p fall with L hip fx and now s/p hemi-arthroplasty by posterior approach.  Clinical Impression  Pt admitted as above and presenting with functional mobility limitations 2* decreased L LE strength/ROM, post op pain, ambulatory balance deficits and c/o dizziness with attempts at OOB activity.  This date, BP in sitting 113/52 but dropped to 80/42 after ambulating 7'.  Pt would benefit from follow up rehab at SNF level to maximize IND and safety.     Recommendations for follow up therapy are one component of a multi-disciplinary discharge planning process, led by the attending physician.  Recommendations may be updated based on patient status, additional functional criteria and insurance authorization.  Follow Up Recommendations Skilled nursing-short term rehab (<3 hours/day) Can patient physically be transported by private vehicle: No    Assistance Recommended at Discharge Frequent or constant Supervision/Assistance  Patient can return home with the following  A lot of help with walking and/or transfers;A little help with bathing/dressing/bathroom;Assistance with cooking/housework;Assist for transportation;Help with stairs or ramp for entrance    Equipment Recommendations None recommended by PT  Recommendations for Other Services       Functional Status Assessment Patient has had a recent decline in their functional status and demonstrates the ability to make significant improvements in function in a reasonable and predictable amount of time.     Precautions / Restrictions Precautions Precautions: Fall;Posterior Hip Required Braces or Orthoses: Knee Immobilizer - Left Restrictions Weight Bearing Restrictions: No LLE Weight Bearing: Weight  bearing as tolerated      Mobility  Bed Mobility Overal bed mobility: Needs Assistance Bed Mobility: Supine to Sit     Supine to sit: Mod assist, Max assist, +2 for physical assistance, +2 for safety/equipment     General bed mobility comments: Increased time with cues for sequence and use of R LE to self assist.  Physical assist to manage L LE, to control trunk and to complete rotation using bed pad    Transfers Overall transfer level: Needs assistance Equipment used: Rolling walker (2 wheels) Transfers: Sit to/from Stand Sit to Stand: Min assist, +2 safety/equipment, From elevated surface           General transfer comment: cues for LE management and use of UEs to self assist. Physical assist to bring wt up and fwd and to balance in initial standing    Ambulation/Gait Ambulation/Gait assistance: Min assist, +2 physical assistance, +2 safety/equipment Gait Distance (Feet): 7 Feet Assistive device: Rolling walker (2 wheels) Gait Pattern/deviations: Step-to pattern, Decreased step length - right, Decreased step length - left, Shuffle, Decreased stance time - left, Antalgic, Trunk flexed Gait velocity: decr     General Gait Details: cues for posture, sequence and position from RW; distance ltd by c/o dizziness  Stairs            Wheelchair Mobility    Modified Rankin (Stroke Patients Only)       Balance Overall balance assessment: Needs assistance Sitting-balance support: No upper extremity supported, Feet supported Sitting balance-Leahy Scale: Fair     Standing balance support: Bilateral upper extremity supported Standing balance-Leahy Scale: Poor  Pertinent Vitals/Pain Pain Assessment Pain Assessment: 0-10 Pain Score: 3  Pain Location: LT hip Pain Intervention(s): Limited activity within patient's tolerance, Monitored during session, Premedicated before session, Ice applied    Home Living Family/patient  expects to be discharged to:: Skilled nursing facility (prior to May; pt states she was close to regaining her IND working at [employer])                        Prior Function Prior Level of Function : Independent/Modified Independent             Mobility Comments: Since being in [facility], pt has been ambulating with a RW and "I'm supposed to have someone with me".       Hand Dominance   Dominant Hand: Right    Extremity/Trunk Assessment   Upper Extremity Assessment Upper Extremity Assessment: Defer to OT evaluation    Lower Extremity Assessment Lower Extremity Assessment: LLE deficits/detail       Communication   Communication: No difficulties  Cognition Arousal/Alertness: Awake/alert Behavior During Therapy: WFL for tasks assessed/performed Overall Cognitive Status: Within Functional Limits for tasks assessed                                 General Comments: Alert, oriented to person, "Redge Gainer", Month as "july", year accurate.        General Comments      Exercises Total Joint Exercises Ankle Circles/Pumps: AROM, Both, 15 reps, Supine   Assessment/Plan    PT Assessment Patient needs continued PT services  PT Problem List Decreased strength;Decreased range of motion;Decreased activity tolerance;Decreased balance;Decreased mobility;Decreased knowledge of use of DME;Pain       PT Treatment Interventions DME instruction;Gait training;Stair training;Functional mobility training;Therapeutic activities;Therapeutic exercise;Balance training;Patient/family education    PT Goals (Current goals can be found in the Care Plan section)  Acute Rehab PT Goals Patient Stated Goal: Regain IND PT Goal Formulation: With patient Time For Goal Achievement: 12/27/21 Potential to Achieve Goals: Good    Frequency Min 3X/week     Co-evaluation PT/OT/SLP Co-Evaluation/Treatment: Yes Reason for Co-Treatment: For patient/therapist safety;To address  functional/ADL transfers PT goals addressed during session: Mobility/safety with mobility OT goals addressed during session: ADL's and self-care       AM-PAC PT "6 Clicks" Mobility  Outcome Measure Help needed turning from your back to your side while in a flat bed without using bedrails?: A Lot Help needed moving from lying on your back to sitting on the side of a flat bed without using bedrails?: A Lot Help needed moving to and from a bed to a chair (including a wheelchair)?: A Lot Help needed standing up from a chair using your arms (e.g., wheelchair or bedside chair)?: A Little Help needed to walk in hospital room?: Total Help needed climbing 3-5 steps with a railing? : Total 6 Click Score: 11    End of Session Equipment Utilized During Treatment: Gait belt Activity Tolerance: Patient limited by fatigue Patient left: in chair;with call bell/phone within reach;with chair alarm set Nurse Communication: Mobility status PT Visit Diagnosis: Unsteadiness on feet (R26.81);Difficulty in walking, not elsewhere classified (R26.2)    Time: 5573-2202 PT Time Calculation (min) (ACUTE ONLY): 31 min   Charges:   PT Evaluation $PT Eval Low Complexity: 1 Low          Mauro Kaufmann PT Acute Rehabilitation Services Pager [phone] Office  [phone]   Amyah Clawson 12/18/2021, 12:30 PM

## 2021-12-18 NOTE — Discharge Instructions (Signed)
INSTRUCTIONS AFTER JOINT REPLACEMENT (hip hemiarthroplasty)  Remove items at home which could result in a fall. This includes throw rugs or furniture in walking pathways ICE to the affected joint every three hours while awake for 30 minutes at a time, for at least the first 3-5 days, and then as needed for pain and swelling.  Continue to use ice for pain and swelling. You may notice swelling that will progress down to the foot and ankle.  This is normal after surgery.  Elevate your leg when you are not up walking on it.   Continue to use the breathing machine you got in the hospital (incentive spirometer) which will help keep your temperature down.  It is common for your temperature to cycle up and down following surgery, especially at night when you are not up moving around and exerting yourself.  The breathing machine keeps your lungs expanded and your temperature down.   DIET:  As you were doing prior to hospitalization, we recommend a well-balanced diet.  DRESSING / WOUND CARE / SHOWERING  You may change your dressing 3-5 days after surgery.  Then change the dressing every day with sterile gauze.  Please use good hand washing techniques before changing the dressing.  Do not use any lotions or creams on the incision until instructed by your surgeon. RN at facility to do daily wound checks and dressing changes.   ACTIVITY  Increase activity slowly as tolerated, but follow the weight bearing instructions below.   No driving for 6 weeks or until further direction given by your physician.  You cannot drive while taking narcotics.  No lifting or carrying greater than 10 lbs. until further directed by your surgeon. Avoid periods of inactivity such as sitting longer than an hour when not asleep. This helps prevent blood clots.  You may return to work once you are authorized by your doctor.     WEIGHT BEARING   Weight bearing as tolerated with assist device (walker, cane, etc) as directed, use it  as long as suggested by your surgeon or therapist, typically at least 4-6 weeks.   EXERCISES  Results after joint replacement surgery are often greatly improved when you follow the exercise, range of motion and muscle strengthening exercises prescribed by your doctor. Safety measures are also important to protect the joint from further injury. Any time any of these exercises cause you to have increased pain or swelling, decrease what you are doing until you are comfortable again and then slowly increase them. If you have problems or questions, call your caregiver or physical therapist for advice.   Rehabilitation is important following a joint replacement. After just a few days of immobilization, the muscles of the leg can become weakened and shrink (atrophy).  These exercises are designed to build up the tone and strength of the thigh and leg muscles and to improve motion. Often times heat used for twenty to thirty minutes before working out will loosen up your tissues and help with improving the range of motion but do not use heat for the first two weeks following surgery (sometimes heat can increase post-operative swelling).     A rehabilitation program following joint replacement surgery can speed recovery and prevent re-injury in the future due to weakened muscles. Contact your doctor or a physical therapist for more information on knee rehabilitation.    CONSTIPATION  Constipation is defined medically as fewer than three stools per week and severe constipation as less than one stool per week.  Even if you have a regular bowel pattern at home, your normal regimen is likely to be disrupted due to multiple reasons following surgery.  Combination of anesthesia, postoperative narcotics, change in appetite and fluid intake all can affect your bowels.   YOU MUST use at least one of the following options; they are listed in order of increasing strength to get the job done.  They are all available over  the counter, and you may need to use some, POSSIBLY even all of these options:    Drink plenty of fluids (prune juice may be helpful) and high fiber foods Colace 100 mg by mouth twice a day  Senokot for constipation as directed and as needed Dulcolax (bisacodyl), take with full glass of water  Miralax (polyethylene glycol) once or twice a day as needed.  If you have tried all these things and are unable to have a bowel movement in the first 3-4 days after surgery call either your surgeon or your primary doctor.    If you experience loose stools or diarrhea, hold the medications until you stool forms back up.  If your symptoms do not get better within 1 week or if they get worse, check with your doctor.  If you experience "the worst abdominal pain ever" or develop nausea or vomiting, please contact the office immediately for further recommendations for treatment.   ITCHING:  If you experience itching with your medications, try taking only a single pain pill, or even half a pain pill at a time.  You can also use Benadryl over the counter for itching or also to help with sleep.   TED HOSE STOCKINGS:  Use stockings on both legs until for at least 2 weeks or as directed by physician office. They may be removed at night for sleeping.  MEDICATIONS:  See your medication summary on the "After Visit Summary" that nursing will review with you.  You may have some home medications which will be placed on hold until you complete the course of blood thinner medication.  It is important for you to complete the blood thinner medication as prescribed.  PRECAUTIONS:  If you experience chest pain or shortness of breath - call 911 immediately for transfer to the hospital emergency department.   If you develop a fever greater that 101 F, purulent drainage from wound, increased redness or drainage from wound, foul odor from the wound/dressing, or calf pain - CONTACT YOUR SURGEON.                                                    FOLLOW-UP APPOINTMENTS:  If you do not already have a post-op appointment, please call the office for an appointment to be seen by your surgeon.  Guidelines for how soon to be seen are listed in your "After Visit Summary", but are typically between 1-4 weeks after surgery.    POST-OPERATIVE OPIOID TAPER INSTRUCTIONS: It is important to wean off of your opioid medication as soon as possible. If you do not need pain medication after your surgery it is ok to stop day one. Opioids include: Codeine, Hydrocodone(Norco, Vicodin), Oxycodone(Percocet, oxycontin) and hydromorphone amongst others.  Long term and even short term use of opiods can cause: Increased pain response Dependence Constipation Depression Respiratory depression And more.  Withdrawal symptoms can include Flu like symptoms Nausea, vomiting And  more Techniques to manage these symptoms Hydrate well Eat regular healthy meals Stay active Use relaxation techniques(deep breathing, meditating, yoga) Do Not substitute Alcohol to help with tapering If you have been on opioids for less than two weeks and do not have pain than it is ok to stop all together.  Plan to wean off of opioids This plan should start within one week post op of your joint replacement. Maintain the same interval or time between taking each dose and first decrease the dose.  Cut the total daily intake of opioids by one tablet each day Next start to increase the time between doses. The last dose that should be eliminated is the evening dose.   MAKE SURE YOU:  Understand these instructions.  Get help right away if you are not doing well or get worse.    Thank you for letting us be a part of your medical care team.  It is a privilege we respect greatly.  We hope these instructions will help you stay on track for a fast and full recovery!   Dental Antibiotics:  In most cases prophylactic antibiotics for Dental procdeures after total joint surgery  are not necessary.  Exceptions are as follows:  1. History of prior total joint infection  2. Severely immunocompromised (Organ Transplant, cancer chemotherapy, Rheumatoid biologic meds such as Humera)  3. Poorly controlled diabetes (A1C &gt; 8.0, blood glucose over 200)  If you have one of these conditions, contact your surgeon for an antibiotic prescription, prior to your dental procedure.

## 2021-12-18 NOTE — Progress Notes (Signed)
Physical Therapy Treatment Patient Details Name: Evelyn [name] MRN: [id number] DOB: 05/30/1925 Today's Date: 12/18/2021   History of Present Illness Pt admitted from [facility] (rehab for recovery from recent fall with compression fx).  Pt s/p fall with L hip fx and now s/p hemi-arthroplasty by posterior approach.    PT Comments    Pt continues very cooperative but limited by decreased BP and dizziness with attempts to mobilize.  BP 91/50 - RN aware.  Recommendations for follow up therapy are one component of a multi-disciplinary discharge planning process, led by the attending physician.  Recommendations may be updated based on patient status, additional functional criteria and insurance authorization.  Follow Up Recommendations  Skilled nursing-short term rehab (<3 hours/day) Can patient physically be transported by private vehicle: No   Assistance Recommended at Discharge Frequent or constant Supervision/Assistance  Patient can return home with the following A lot of help with walking and/or transfers;A little help with bathing/dressing/bathroom;Assistance with cooking/housework;Assist for transportation;Help with stairs or ramp for entrance   Equipment Recommendations  None recommended by PT    Recommendations for Other Services       Precautions / Restrictions Precautions Precautions: Fall;Posterior Hip Required Braces or Orthoses: Knee Immobilizer - Left Restrictions Weight Bearing Restrictions: No LLE Weight Bearing: Weight bearing as tolerated     Mobility  Bed Mobility Overal bed mobility: Needs Assistance Bed Mobility: Sit to Supine     Supine to sit: Mod assist, Max assist, +2 for physical assistance, +2 for safety/equipment Sit to supine: Mod assist, +2 for physical assistance, +2 for safety/equipment   General bed mobility comments: Increased time with cues for sequence and use of R LE to self assist.  Physical assist to manage L LE, to control trunk and  to complete rotation using bed pad    Transfers Overall transfer level: Needs assistance Equipment used: Rolling walker (2 wheels) Transfers: Sit to/from Stand, Bed to chair/wheelchair/BSC Sit to Stand: Min assist, Mod assist, +2 safety/equipment   Step pivot transfers: Min assist, Mod assist, +2 safety/equipment       General transfer comment: cues for LE management and use of UEs to self assist. Physical assist to bring wt up and fwd and to balance in initial standing    Ambulation/Gait Ambulation/Gait assistance: Min assist, +2 physical assistance, +2 safety/equipment Gait Distance (Feet): 3 Feet Assistive device: Rolling walker (2 wheels) Gait Pattern/deviations: Step-to pattern, Decreased step length - right, Decreased step length - left, Shuffle, Decreased stance time - left, Antalgic, Trunk flexed Gait velocity: decr     General Gait Details: cues for posture, sequence and position from RW; distance ltd by c/o dizziness   Stairs             Wheelchair Mobility    Modified Rankin (Stroke Patients Only)       Balance Overall balance assessment: Needs assistance Sitting-balance support: No upper extremity supported, Feet supported Sitting balance-Leahy Scale: Fair     Standing balance support: Bilateral upper extremity supported Standing balance-Leahy Scale: Poor                              Cognition Arousal/Alertness: Awake/alert Behavior During Therapy: WFL for tasks assessed/performed Overall Cognitive Status: Within Functional Limits for tasks assessed  General Comments: Alert, oriented to person, "Redge Gainer", Month as "july", year accurate.        Exercises Total Joint Exercises Ankle Circles/Pumps: AROM, Both, 15 reps, Supine    General Comments        Pertinent Vitals/Pain Pain Assessment Pain Assessment: 0-10 Pain Score: 3  Pain Location: LT hip Pain Intervention(s):  Limited activity within patient's tolerance, Monitored during session    Home Living Family/patient expects to be discharged to:: Skilled nursing facility                        Prior Function            PT Goals (current goals can now be found in the care plan section) Acute Rehab PT Goals Patient Stated Goal: Regain IND PT Goal Formulation: With patient Time For Goal Achievement: 12/27/21 Potential to Achieve Goals: Good Progress towards PT goals: Progressing toward goals    Frequency    Min 3X/week      PT Plan Current plan remains appropriate    Co-evaluation PT/OT/SLP Co-Evaluation/Treatment: Yes Reason for Co-Treatment: For patient/therapist safety;To address functional/ADL transfers PT goals addressed during session: Mobility/safety with mobility OT goals addressed during session: ADL's and self-care      AM-PAC PT "6 Clicks" Mobility   Outcome Measure  Help needed turning from your back to your side while in a flat bed without using bedrails?: A Lot Help needed moving from lying on your back to sitting on the side of a flat bed without using bedrails?: A Lot Help needed moving to and from a bed to a chair (including a wheelchair)?: A Lot Help needed standing up from a chair using your arms (e.g., wheelchair or bedside chair)?: A Little Help needed to walk in hospital room?: Total Help needed climbing 3-5 steps with a railing? : Total 6 Click Score: 11    End of Session Equipment Utilized During Treatment: Gait belt Activity Tolerance: Patient limited by fatigue Patient left: in bed;with call bell/phone within reach;with family/visitor present;with nursing/sitter in room Nurse Communication: Mobility status PT Visit Diagnosis: Unsteadiness on feet (R26.81);Difficulty in walking, not elsewhere classified (R26.2)     Time: 1250-1311 PT Time Calculation (min) (ACUTE ONLY): 21 min  Charges:  $Therapeutic Activity: 8-22 mins                      Mauro Kaufmann PT Acute Rehabilitation Services Pager [phone] Office [phone]    Ambrea Hegler 12/18/2021, 1:50 PM

## 2021-12-18 NOTE — Progress Notes (Signed)
Pt came from PACU without knee immobilizer in place. Rn will pass on to day shift that a knee immobilizer is  needed for this pt, so ortho tech can be called in am. Pt continues to remain stable and denies pain other than a sore throat.

## 2021-12-18 NOTE — TOC Initial Note (Signed)
Transition of Care [facility]) - Initial/Assessment Note   Patient Details  Name: Evelyn [name] MRN: [id number] Date of Birth: 1924-09-05  [facility] CM/SW Contact:    Ewing Schlein, LCSW Phone Number: 12/18/2021, 1:27 PM  Clinical Narrative: PT evaluation recommended SNF. CSW spoke with patient regarding SNF. Per patient, she resides at [facility] in [place] and she is agreeable to returning for rehab.  CSW confirmed with Meryle Ready in admissions that patient can return for rehab. However, as patient will need a 3 midnight inpatient stay and the pharmacy is closed on Sundays, patient will not be able to be admitted until 12/21/21.  FL2 done; PASRR pending as patient is a level II PASRR. Initial referral and therapy notes faxed to [facility] in hub. TOC to follow.  Expected Discharge Plan: Skilled Nursing Facility Barriers to Discharge: Continued Medical Work up  Patient Goals and CMS Choice Patient states their goals for this hospitalization and ongoing recovery are:: Go to [facility] SN[facility] CMS Medicare.gov Compare Post Acute Care list provided to:: Patient Choice offered to / list presented to : Patient  Expected Discharge Plan and Services Expected Discharge Plan: Skilled Nursing Facility In-house Referral: Clinical Social Work Post Acute Care Choice: Skilled Nursing Facility Living arrangements for the past 2 months: Independent Living Facility            DME Arranged: N/A DME Agency: NA  Prior Living Arrangements/Services Living arrangements for the past 2 months: Independent Living Facility Lives with:: Self Patient language and need for interpreter reviewed:: Yes Do you feel safe going back to the place where you live?: Yes      Need for Family Participation in Patient Care: No (Comment) Care giver support system in place?: Yes (comment) Criminal Activity/Legal Involvement Pertinent to Current Situation/Hospitalization: No - Comment as  needed  Activities of Daily Living Home Assistive Devices/Equipment: None ADL Screening (condition at time of admission) Patient's cognitive ability adequate to safely complete daily activities?: Yes Is the patient deaf or have difficulty hearing?: No Does the patient have difficulty seeing, even when wearing glasses/contacts?: No Does the patient have difficulty concentrating, remembering, or making decisions?: No Patient able to express need for assistance with ADLs?: Yes Does the patient have difficulty dressing or bathing?: No Independently performs ADLs?: Yes (appropriate for developmental age) Does the patient have difficulty walking or climbing stairs?: Yes Weakness of Legs: None Weakness of Arms/Hands: None  Permission Sought/Granted Permission sought to share information with : Facility Industrial/product designer granted to share information with : Yes, Verbal Permission Granted Permission granted to share info w AGENCY: [facility]  Emotional Assessment Attitude/Demeanor/Rapport: Engaged Affect (typically observed): Accepting, Appropriate Orientation: : Oriented to Self, Oriented to Place, Oriented to  Time, Oriented to Situation Alcohol / Substance Use: Not Applicable  Admission diagnosis:  Hip fracture (HCC) [S72.009A] Fracture of femoral neck, left (HCC) [S72.002A] Left displaced femoral neck fracture (HCC) [S72.002A] Fall at nursing home, initial encounter [W19.Lorne Skeens, Y92.129] Patient Active Problem List   Diagnosis Date Noted   Pyuria 12/18/2021   Fever 12/18/2021   Macrocytic anemia 12/18/2021   Fracture of femoral neck, left (HCC) 12/17/2021   Hip fracture (HCC) 12/17/2021   Delirium 11/09/2021   HTN (hypertension) 11/08/2021   Hypokalemia 11/08/2021   Compression fracture of body of thoracic vertebra (HCC) 11/06/2021   Acute anemia 11/06/2021   Cognitive impairment 11/06/2021   Lactic acidosis 11/06/2021   Black stools    Heme + stool  Peptic ulcer disease    Acute metabolic encephalopathy 11/05/2021   Closed wedge compression fracture of T12 vertebra (HCC) 11/24/2016   Chronic midline low back pain 11/18/2016   Chronic renal disease, stage 3, moderately decreased glomerular filtration rate (GFR) between 30-59 mL/min/1.73 square meter (HCC) 11/18/2016   IBS (irritable bowel syndrome)    UTI (urinary tract infection)    Routine general medical examination at a health care facility 05/03/2013   Chronic idiopathic constipation 03/31/2012   Other screening mammogram 03/24/2011   Depression with somatization 03/06/2010   Insomnia 03/08/2008   Hyperlipidemia with target LDL less than 130 02/21/2007   PCP:  Frederica Kuster, MD Pharmacy:   [id number] [place], [place] - [address] AT [facility] [address] [facility] [address] [place] [address] Phone: [phone] Fax: [phone]  Readmission Risk Interventions     No data to display

## 2021-12-18 NOTE — TOC PASRR Note (Signed)
30 Day PASRR Note  Patient Details  Name: Evelyn [name] Date of Birth: 1925-05-06 [place] MUST ID: [id number]  Transition of Care (TOC) CM/SW Contact:    Ewing Schlein, LCSW Phone Number: 12/18/2021, 1:40 PM  To Whom It May Concern:  Please be advised that this patient will require a short-term nursing home stay - anticipated 30 days or less for rehabilitation and strengthening. The plan is for return home.

## 2021-12-18 NOTE — Evaluation (Signed)
Occupational Therapy Evaluation Patient Details Name: Evelyn [name] MRN: [id number] DOB: May 22, 1925 Today's Date: 12/18/2021   History of Present Illness Pt admitted from [facility] (rehab for recovery from recent fall with compression fx).  Pt s/p fall with L hip fx and now s/p hemi-arthroplasty by posterior approach.   Clinical Impression   Patient is currently requiring assistance with ADLs including maximum to total assist with Lower body ADLs, setup to Min guard assist with seated Upper body ADLs,  as well as  Mod-Max assist of 2 people with bed mobility and minimal assist with functional transfers to toilet with drop in BP from 113/52 to 80/42 and pt symptomatic requiring 2nd person chair follow for safety.  Current level of function is below patient's typical baseline.  During this evaluation, patient was limited by generalized weakness, impaired activity tolerance, and post-op pain and precautions, all of which has the potential to impact patient's safety and independence during functional mobility, as well as performance for ADLs.  Patient lives alone at baseline but admitted from her SNF at [facility] place and would like to return there to continue her rehab. Patient demonstrates good rehab potential, and should benefit from continued skilled occupational therapy services while in acute care to maximize safety, independence and quality of life at home.  Continued occupational therapy services in a SNF setting prior to return home is recommended.  ?     Recommendations for follow up therapy are one component of a multi-disciplinary discharge planning process, led by the attending physician.  Recommendations may be updated based on patient status, additional functional criteria and insurance authorization.   Follow Up Recommendations  Skilled nursing-short term rehab (<3 hours/day)    Assistance Recommended at Discharge Frequent or constant Supervision/Assistance  Patient can  return home with the following A lot of help with walking and/or transfers;A lot of help with bathing/dressing/bathroom    Functional Status Assessment  Patient has had a recent decline in their functional status and demonstrates the ability to make significant improvements in function in a reasonable and predictable amount of time.  Equipment Recommendations  Other (comment) (Will defer to post-acute recommendations)    Recommendations for Other Services       Precautions / Restrictions Precautions Precautions: Fall;Posterior Hip Required Braces or Orthoses: Knee Immobilizer - Left Restrictions Weight Bearing Restrictions: No LLE Weight Bearing: Weight bearing as tolerated      Mobility Bed Mobility Overal bed mobility: Needs Assistance Bed Mobility: Supine to Sit     Supine to sit: Mod assist, Max assist, +2 for physical assistance, +2 for safety/equipment     General bed mobility comments: Increased time with cues for sequence and use of R LE to self assist.  Physical assist to manage L LE, to control trunk and to complete rotation using bed pad    Transfers                          Balance Overall balance assessment: Needs assistance Sitting-balance support: No upper extremity supported, Feet supported Sitting balance-Leahy Scale: Fair     Standing balance support: Bilateral upper extremity supported Standing balance-Leahy Scale: Poor                             ADL either performed or assessed with clinical judgement   ADL Overall ADL's : Needs assistance/impaired Eating/Feeding: Set up;Sitting   Grooming: Set up;Sitting  Upper Body Bathing: Set up;Sitting;Supervision/ safety   Lower Body Bathing: Maximal assistance;Sitting/lateral leans;Sit to/from stand;Moderate assistance   Upper Body Dressing : Sitting;Min guard   Lower Body Dressing: Total assistance;Sitting/lateral leans Lower Body Dressing Details (indicate cue type and  reason): To don socks. Toilet Transfer: Cueing for sequencing;Cueing for safety;BSC/3in1;Minimal assistance;Rolling walker (2 wheels) Toilet Transfer Details (indicate cue type and reason): Simulated to recliner Toileting- Clothing Manipulation and Hygiene: Total assistance Toileting - Clothing Manipulation Details (indicate cue type and reason): Remains with foley catheter.     Functional mobility during ADLs: Minimal assistance;Moderate assistance;Maximal assistance;Rolling walker (2 wheels);+2 for physical assistance       Vision Ability to See in Adequate Light: 1 Impaired Patient Visual Report: No change from baseline Additional Comments: Congenital RT eye blindness per chart.     Perception Perception Perception: Within Functional Limits   Praxis Praxis Praxis: Intact    Pertinent Vitals/Pain Pain Assessment Pain Assessment: 0-10 Pain Score: 3  Pain Location: LT hip Pain Intervention(s): Limited activity within patient's tolerance, Monitored during session, Premedicated before session     Hand Dominance Right   Extremity/Trunk Assessment Upper Extremity Assessment Upper Extremity Assessment: Overall WFL for tasks assessed   Lower Extremity Assessment Lower Extremity Assessment: Defer to PT evaluation       Communication Communication Communication: No difficulties   Cognition Arousal/Alertness: Awake/alert Behavior During Therapy: WFL for tasks assessed/performed Overall Cognitive Status: Within Functional Limits for tasks assessed                                 General Comments: Alert, oriented to person, "Redge Gainer", Month as "july", year accurate.  Some mild confusion.     General Comments       Exercises     Shoulder Instructions      Home Living Family/patient expects to be discharged to:: Skilled nursing facility                                        Prior Functioning/Environment Prior Level of Function :  Independent/Modified Independent             Mobility Comments: Since being in [facility], pt has been ambulating with a RW and "I'm supposed to have someone with me".          OT Problem List: Impaired balance (sitting and/or standing);Decreased knowledge of precautions;Decreased knowledge of use of DME or AE;Decreased safety awareness;Decreased activity tolerance;Decreased range of motion;Pain      OT Treatment/Interventions: Self-care/ADL training;Therapeutic activities;Cognitive remediation/compensation;Visual/perceptual remediation/compensation;Energy conservation;Patient/family education;DME and/or AE instruction;Balance training    OT Goals(Current goals can be found in the care plan section) Acute Rehab OT Goals Patient Stated Goal: To return to [facility] OT Goal Formulation: With patient Time For Goal Achievement: 01/01/22 Potential to Achieve Goals: Good ADL Goals Pt Will Perform Grooming: with supervision;standing (Tolerating at least 2/2 at sink with VSS) Pt Will Perform Lower Body Dressing: with set-up;with adaptive equipment;with supervision;sitting/lateral leans;sit to/from stand Pt Will Transfer to Toilet: with supervision;ambulating Pt Will Perform Toileting - Clothing Manipulation and hygiene: with supervision;sitting/lateral leans;sit to/from stand Additional ADL Goal #1: Pt will verbalize 3/3 posterior hip precautions and demonstrate compliance with ADLs and mobility with no more than 2 cues per session. Additional ADL Goal #2: Pt will demosntrate intact safety awareness and use  appropraite fall prevention strategies when up with RW to compensate for RT eye blindness and avoid future falls.  OT Frequency: Min 2X/week    Co-evaluation PT/OT/SLP Co-Evaluation/Treatment: Yes Reason for Co-Treatment: For patient/therapist safety;To address functional/ADL transfers PT goals addressed during session: Mobility/safety with mobility OT goals addressed  during session: ADL's and self-care      AM-PAC OT "6 Clicks" Daily Activity     Outcome Measure Help from another person eating meals?: None Help from another person taking care of personal grooming?: A Little Help from another person toileting, which includes using toliet, bedpan, or urinal?: A Lot Help from another person bathing (including washing, rinsing, drying)?: A Lot Help from another person to put on and taking off regular upper body clothing?: A Little Help from another person to put on and taking off regular lower body clothing?: Total 6 Click Score: 15   End of Session Equipment Utilized During Treatment: Gait belt;Rolling walker (2 wheels);Oxygen Nurse Communication: Mobility status  Activity Tolerance: Treatment limited secondary to medical complications (Comment) (Limited by BP drop and symptomatic dizziness.) Patient left: in chair;with call bell/phone within reach;with chair alarm set  OT Visit Diagnosis: Unsteadiness on feet (R26.81);Repeated falls (R29.6);History of falling (Z91.81);Pain Pain - Right/Left: Left Pain - part of body: Hip                Time: 1001-1032 OT Time Calculation (min): 31 min Charges:  OT General Charges $OT Visit: 1 Visit OT Evaluation $OT Eval Moderate Complexity: 1 Mod  Mycala Warshawsky, OT Acute Rehab Services Office: [phone] 12/18/2021   Theodoro Clock 12/18/2021, 1:34 PM

## 2021-12-19 DIAGNOSIS — S72002A Fracture of unspecified part of neck of left femur, initial encounter for closed fracture: Secondary | ICD-10-CM | POA: Diagnosis not present

## 2021-12-19 LAB — CBC
HCT: 28.2 % — ABNORMAL LOW (ref 36.0–46.0)
Hemoglobin: 8.7 g/dL — ABNORMAL LOW (ref 12.0–15.0)
MCH: 32 pg (ref 26.0–34.0)
MCHC: 30.9 g/dL (ref 30.0–36.0)
MCV: 103.7 fL — ABNORMAL HIGH (ref 80.0–100.0)
Platelets: 145 10*3/uL — ABNORMAL LOW (ref 150–400)
RBC: 2.72 MIL/uL — ABNORMAL LOW (ref 3.87–5.11)
RDW: 14.7 % (ref 11.5–15.5)
WBC: 8.7 10*3/uL (ref 4.0–10.5)
nRBC: 0 % (ref 0.0–0.2)

## 2021-12-19 LAB — BASIC METABOLIC PANEL
Anion gap: 8 (ref 5–15)
BUN: 39 mg/dL — ABNORMAL HIGH (ref 8–23)
CO2: 19 mmol/L — ABNORMAL LOW (ref 22–32)
Calcium: 9 mg/dL (ref 8.9–10.3)
Chloride: 111 mmol/L (ref 98–111)
Creatinine, Ser: 1.21 mg/dL — ABNORMAL HIGH (ref 0.44–1.00)
GFR, Estimated: 41 mL/min — ABNORMAL LOW (ref >60)
Glucose, Bld: 108 mg/dL — ABNORMAL HIGH (ref 70–99)
Potassium: 4.1 mmol/L (ref 3.5–5.1)
Sodium: 138 mmol/L (ref 135–145)

## 2021-12-19 MED ORDER — SODIUM CHLORIDE 0.9 % IV SOLN: INTRAVENOUS | Status: DC

## 2021-12-19 NOTE — Progress Notes (Signed)
Rn notified md of pt bladders can of 227cc after urination in bsc. Pt has had two small urinations in bsc today. In and out cath ordered and performed. RN will continue to monitor.

## 2021-12-19 NOTE — Progress Notes (Signed)
PROGRESS NOTE Evelyn [name]  GBT:[id number] DOB: 1924-07-01 DOA: 12/17/2021 PCP: Frederica Kuster, MD   Brief Narrative/Hospital Course: 88 y.o.f w/ hyperlipidemia, recurrent UTIs, osteoarthritis, depression, hypertension, anemia who presented from SNF after mechanical fall while trying to get out of bed to use the restroom, found to have left femoral neck fracture displaced.  Orthopedic was consulted and patient was admitted. In  ED- UA wbc 11-20, leukocytes small and nitrite positive, labs with chronic anemia hemoglobin 10 g, mild leukocytosis 11.7, creatinine 1.1 with bicarb 17.  Patient underwent left hip hemiarthroplasty by Dr. Ophelia Charter.    Subjective:  Seen and examined this morning Patient resting comfortably.  Has no new complaints. Overnight afebrile. Labs with slight bump in creatinine 1.2 mild leukocytosis resolved hemoglobin 8.7 g.   Assessment and Plan: Principal Problem:   Fracture of femoral neck, left (HCC) Active Problems:   Depression with somatization   Chronic idiopathic constipation   Cognitive impairment   HTN (hypertension)   Pyuria   Fever   Macrocytic anemia   Malnutrition of moderate degree   Fracture of femoral neck, left: 2/2 mechanical fall,s/p left hip hemiarthroplasty.  Postop DVT prophylaxis w/ aspirin  325 mg daily along with pain medication, bowel regimen PT OT as per orthopedic surgery.  Planning for skilled nursing facility.  HTN: Remains fairly controlled, continue HCTZ. hold Lasix.  Pyuria/Fever 101.2 at 5 PM 6/29-pyuria noted on admission ? UTI-repeat UA grossly abnormal Started on empiric antibiotic pending further urine culture.    AKI with metabolic acidosis bicarb improving.  Slight bump in creatinine noted start IV fluid hydration repeat BMP in a.m. Recent Labs  Lab 12/17/21 0430 12/18/21 0316 12/19/21 0313  BUN 36* 26* 39*  CREATININE 1.12* 0.84 1.21*    Microcytic anemia hemoglobin at 10 g range.  STABLE/HIGH check B12, and  started folate Recent Labs  Lab 12/17/21 0430 12/18/21 0316 12/19/21 0313  HGB 10.4* 10.1* 8.7*  HCT 31.9* 32.0* 28.2*    Depression with somatization Cognitive impairment: Continue fall precaution delirium precaution PT OT.  Chronic idiopathic constipation: cont Bowel regimen  Moderate malnutrition augment diet as below Nutrition Problem: Moderate Malnutrition Etiology: chronic illness Signs/Symptoms: moderate fat depletion, severe muscle depletion Interventions: Ensure Enlive (each supplement provides 350kcal and 20 grams of protein), MVI   DVT prophylaxis:asa 325 mg daily- SCDs Start: 12/17/21 2104 SCDs Start: 12/17/21 0926 Code Status:   Code Status: Full Code Family Communication: plan of care discussed with patient at bedside. Patient status is: Inpatient because of hip surgery and postop care Level of care: Med-Surg   Dispo: The patient is from: Skilled nursing facility            Anticipated disposition: Skilled nursing facility in 1-2 days, psaar level 2 needed  Mobility Assessment (last 72 hours)     Mobility Assessment     Row Name 12/19/21 0726 12/18/21 2025 12/18/21 1331 12/18/21 1323 12/18/21 1200   Does patient have an order for bedrest or is patient medically unstable No - Continue assessment No - Continue assessment -- -- --   What is the highest level of mobility based on the progressive mobility assessment? Level 4 (Walks with assist in room) - Balance while marching in place and cannot step forward and back - Complete Level 4 (Walks with assist in room) - Balance while marching in place and cannot step forward and back - Complete Level 4 (Walks with assist in room) - Balance while marching in place and cannot  step forward and back - Complete Level 4 (Walks with assist in room) - Balance while marching in place and cannot step forward and back - Complete Level 4 (Walks with assist in room) - Balance while marching in place and cannot step forward and back -  Complete   Is the above level different from baseline mobility prior to current illness? Yes - Recommend PT order Yes - Recommend PT order -- -- --    [place] Name 12/18/21 0815 12/17/21 2058 12/17/21 1132       Does patient have an order for bedrest or is patient medically unstable -- No - Continue assessment Yes- Bedfast (Level 1) - Complete     What is the highest level of mobility based on the progressive mobility assessment? Level 4 (Walks with assist in room) - Balance while marching in place and cannot step forward and back - Complete Level 3 (Stands with assist) - Balance while standing  and cannot march in place --     Is the above level different from baseline mobility prior to current illness? -- Yes - Recommend PT order --               Objective: Vitals last 24 hrs: Vitals:   12/18/21 0528 12/18/21 0958 12/18/21 2123 12/19/21 0637  BP: (!) 152/71 (!) 100/47 (!) 120/50 (!) 155/63  Pulse: 73 78 79 79  Resp: 18 17 18 18   Temp: (!) 97.5 F (36.4 C) 98.3 F (36.8 C) 98.1 F (36.7 C) 98.6 F (37 C)  TempSrc: Oral Oral Oral Oral  SpO2: 96% 99%    Weight:      Height:       Weight change:   Physical Examination: General exam: AA, elderly HEENT:Oral mucosa moist, Ear/Nose WNL grossly, dentition normal. Respiratory system: bilaterally clear,no use of accessory muscle Cardiovascular system:S1 & S2 +,No JVD,. Gastrointestinal system:Abdomen soft,NT,ND,BS+ Nervous System:Alert, awake, moving extremities and grossly nonfocal Extremities: LE ankle edema neg, left hip fracture surgical site with dressing in place Skin: No rashes,no icterus. MSK: Normal muscle bulk,tone, power   Medications reviewed:  Scheduled Meds:  aspirin EC  325 mg Oral Q breakfast   cholecalciferol  1,000 Units Oral Daily   docusate sodium  100 mg Oral BID   feeding supplement  237 mL Oral BID BM   fentaNYL (SUBLIMAZE) injection  50-100 mcg Intravenous UD   FLUoxetine  20 mg Oral Daily   folic acid   1 mg Oral Daily   hydrochlorothiazide  12.5 mg Oral Daily   multivitamin with minerals  1 tablet Oral Daily   oxybutynin  2.5 mg Oral TID   pantoprazole  40 mg Oral QAC breakfast   polyethylene glycol  17 g Oral Daily   zolpidem  5 mg Oral QHS   Continuous Infusions:  sodium chloride 75 mL/hr at 12/19/21 0838   cefTRIAXone (ROCEPHIN)  IV 200 mL/hr at 12/19/21 [id number]      Diet Order             Diet regular Room service appropriate? Yes; Fluid consistency: Thin  Diet effective now                    Nutrition Problem: Moderate Malnutrition Etiology: chronic illness Signs/Symptoms: moderate fat depletion, severe muscle depletion Interventions: Ensure Enlive (each supplement provides 350kcal and 20 grams of protein), MVI   Intake/Output Summary (Last 24 hours) at 12/19/2021 1058 Last data filed at 12/19/2021 0907 Gross per 24 hour  Intake 900.69 ml  Output 1700 ml  Net -799.31 ml   Net IO Since Admission: -1,234.31 mL [12/19/21 1058]  Wt Readings from Last 3 Encounters:  12/17/21 47.7 kg  12/10/21 47.7 kg  11/05/21 47.5 kg     Unresulted Labs (From admission, onward)     Start     Ordered   12/19/21 0500  Basic metabolic panel  Daily,   R      12/18/21 0746          Data Reviewed: I have personally reviewed following labs and imaging studies CBC: Recent Labs  Lab 12/17/21 0430 12/18/21 0316 12/19/21 0313  WBC 11.7* 11.3* 8.7  NEUTROABS 10.2*  --   --   HGB 10.4* 10.1* 8.7*  HCT 31.9* 32.0* 28.2*  MCV 98.8 100.6* 103.7*  PLT 196 167 145*   Basic Metabolic Panel: Recent Labs  Lab 12/17/21 0430 12/18/21 0316 12/19/21 0313  NA 139 135 138  K 4.3 4.2 4.1  CL 112* 109 111  CO2 17* 20* 19*  GLUCOSE 121* 159* 108*  BUN 36* 26* 39*  CREATININE 1.12* 0.84 1.21*  CALCIUM 9.0 8.4* 9.0   GFR: Estimated Creatinine Clearance: 20.9 mL/min (A) (by C-G formula based on SCr of 1.21 mg/dL (H)). Liver Function Tests: No results for input(s): "AST", "ALT",  "ALKPHOS", "BILITOT", "PROT", "ALBUMIN" in the last 168 hours. No results for input(s): "LIPASE", "AMYLASE" in the last 168 hours. No results for input(s): "AMMONIA" in the last 168 hours. Coagulation Profile: Recent Labs  Lab 12/17/21 0742  INR 1.4*   BNP (last 3 results) No results for input(s): "PROBNP" in the last 8760 hours. HbA1C: No results for input(s): "HGBA1C" in the last 72 hours. CBG: No results for input(s): "GLUCAP" in the last 168 hours. Lipid Profile: No results for input(s): "CHOL", "HDL", "LDLCALC", "TRIG", "CHOLHDL", "LDLDIRECT" in the last 72 hours. Thyroid Function Tests: No results for input(s): "TSH", "T4TOTAL", "FREET4", "T3FREE", "THYROIDAB" in the last 72 hours. Sepsis Labs: No results for input(s): "PROCALCITON", "LATICACIDVEN" in the last 168 hours.  Recent Results (from the past 240 hour(s))  MRSA Next Gen by PCR, Nasal     Status: None   Collection Time: 12/17/21 11:54 AM   Specimen: Nasal Mucosa; Nasal Swab  Result Value Ref Range Status   MRSA by PCR Next Gen NOT DETECTED NOT DETECTED Final    Comment: (NOTE) The GeneXpert MRSA Assay (FDA approved for NASAL specimens only), is one component of a comprehensive MRSA colonization surveillance program. It is not intended to diagnose MRSA infection nor to guide or monitor treatment for MRSA infections. Test performance is not FDA approved in patients less than 46 years old. Performed at [facility], [address], [place], [place] [address]   Urine Culture     Status: Abnormal (Preliminary result)   Collection Time: 12/18/21  1:11 PM   Specimen: Urine, Clean Catch  Result Value Ref Range Status   Specimen Description   Final    URINE, CLEAN CATCH Performed at [facility], [address] [place], [place] [address]    Special Requests   Final    NONE Performed at [facility], [address] [place], [place] [address]    Culture (A)   Final    >=100,000 COLONIES/mL GRAM NEGATIVE RODS SUSCEPTIBILITIES TO FOLLOW Performed at [facility], [address], [place], [place] [address]    Report Status PENDING  Incomplete  Antimicrobials: Anti-infectives (From admission, onward)    Start     Dose/Rate Route Frequency Ordered Stop   12/18/21 1500  cefTRIAXone (ROCEPHIN) 1 g in sodium chloride 0.9 % 100 mL IVPB        1 g 200 mL/hr over 30 Minutes Intravenous Daily 12/18/21 1354     12/18/21 0600  ceFAZolin (ANCEF) IVPB 2g/100 mL premix        2 g 200 mL/hr over 30 Minutes Intravenous On call to O.R. 12/17/21 1124 12/17/21 1847      Culture/Microbiology    Component Value Date/Time   SDES  12/18/2021 1311    URINE, CLEAN CATCH Performed at [facility], [address] [place], [place] [address]    SPECREQUEST  12/18/2021 1311    NONE Performed at [facility], [address], [place], [place] [address]    CULT (A) 12/18/2021 1311    >=100,000 COLONIES/mL GRAM NEGATIVE RODS SUSCEPTIBILITIES TO FOLLOW Performed at [facility], [address] [place], [place] [address]    REPTSTATUS PENDING 12/18/2021 1311    Other culture-see note  Radiology Studies: Pelvis Portable  Result Date: 12/17/2021 CLINICAL DATA:  Left hip arthroplasty EXAM: PORTABLE PELVIS 1-2 VIEWS COMPARISON:  None Available. FINDINGS: Single orthopedic view of the pelvis demonstrates surgical changes of left hip bipolar hemiarthroplasty. Arthroplasty component overlies its expected position. No unexpected fracture or dislocation. Severe right hip degenerative arthritis is incidentally noted. Suture anchors related to probable bladder suspension noted within the pubic symphysis. Extensive gas surrounding the left hip is likely postsurgical in nature. Surgical skin staples are noted superolateral to the left hip. IMPRESSION: Status post left hip bipolar hemiarthroplasty. No unexpected fracture or  dislocation. Electronically Signed   By: Helyn Numbers M.D.   On: 12/17/2021 20:51     LOS: 2 days   Lanae Boast, MD [facility]  12/19/2021, 10:58 AM

## 2021-12-19 NOTE — Plan of Care (Signed)
  Problem: Coping: Goal: Level of anxiety will decrease Outcome: Progressing   Problem: Pain Managment: Goal: General experience of comfort will improve Outcome: Progressing   Problem: Safety: Goal: Ability to remain free from injury will improve Outcome: Progressing   

## 2021-12-19 NOTE — Progress Notes (Signed)
Pt remains stable on 02 [place] at 2l. Pt 02 sat was 86 percent on room air at 1300 check of vs. Pt had no no shortness of breath or change in lung sounds with these 02 sat changes. Pt remains stable overall. Rn will continue to monitor.

## 2021-12-19 NOTE — Plan of Care (Signed)
Plan of care reviewed and discussed with the patient. 

## 2021-12-19 NOTE — Progress Notes (Signed)
  Subjective: Evelyn [name] is a 88 y.o. female s/p left THA.  They are POD 2.  Pt's pain is controlled.  Pt has ambulated with great difficulty.  Only able to walk 3 feet with PT yesterday.  She states that she feels exhausted.  No complaint of chest pain or shortness of breath or abdominal pain.  No fevers or chills.  Objective: Vital signs in last 24 hours: Temp:  [98.1 F (36.7 C)-98.6 F (37 C)] 98.6 F (37 C) (07/01 3500) Pulse Rate:  [78-79] 79 (07/01 0637) Resp:  [17-18] 18 (07/01 0637) BP: (100-155)/(47-63) 155/63 (07/01 0637) SpO2:  [99 %] 99 % (06/30 0958)  Intake/Output from previous day: 06/30 0701 - 07/01 0700 In: 460 [P.O.:220; I.V.:240] Out: 1950 [Urine:1950] Intake/Output this shift: Total I/O In: 560.7 [P.O.:360; IV Piggyback:200.7] Out: -   Exam:  No gross blood or drainage overlying the dressing.  Dressing is somewhat coming off on the anterior aspect of the dressing.  This was removed and replaced with Aquacel dressing. Left foot warm and well-perfused Sensation intact distally in the left foot Able to dorsiflex and plantarflex the left foot   Labs: Recent Labs    12/17/21 0430 12/18/21 0316 12/19/21 0313  HGB 10.4* 10.1* 8.7*   Recent Labs    12/18/21 0316 12/19/21 0313  WBC 11.3* 8.7  RBC 3.18* 2.72*  HCT 32.0* 28.2*  PLT 167 145*   Recent Labs    12/18/21 0316 12/19/21 0313  NA 135 138  K 4.2 4.1  CL 109 111  CO2 20* 19*  BUN 26* 39*  CREATININE 0.84 1.21*  GLUCOSE 159* 108*  CALCIUM 8.4* 9.0   Recent Labs    12/17/21 0742  INR 1.4*    Assessment/Plan: Pt is POD 2 s/p left THA.    -Plan to discharge to SNF in coming days pending PT eval and clearance by medical team  -WBAT with a walker  -Okay to shower, dressing is waterproof.  Cautioned patient against soaking dressing in bath/pool/body of water     Daulton Harbaugh L Ciara Kagan 12/19/2021, 9:22 AM

## 2021-12-20 ENCOUNTER — Inpatient Hospital Stay (HOSPITAL_COMMUNITY): Payer: Medicare Other

## 2021-12-20 DIAGNOSIS — S72002A Fracture of unspecified part of neck of left femur, initial encounter for closed fracture: Secondary | ICD-10-CM | POA: Diagnosis not present

## 2021-12-20 LAB — CBC WITH DIFFERENTIAL/PLATELET
Abs Immature Granulocytes: 0.11 10*3/uL — ABNORMAL HIGH (ref 0.00–0.07)
Basophils Absolute: 0 10*3/uL (ref 0.0–0.1)
Basophils Relative: 1 %
Eosinophils Absolute: 0.7 10*3/uL — ABNORMAL HIGH (ref 0.0–0.5)
Eosinophils Relative: 10 %
HCT: 27.8 % — ABNORMAL LOW (ref 36.0–46.0)
Hemoglobin: 8.5 g/dL — ABNORMAL LOW (ref 12.0–15.0)
Immature Granulocytes: 2 %
Lymphocytes Relative: 15 %
Lymphs Abs: 1.1 10*3/uL (ref 0.7–4.0)
MCH: 31.7 pg (ref 26.0–34.0)
MCHC: 30.6 g/dL (ref 30.0–36.0)
MCV: 103.7 fL — ABNORMAL HIGH (ref 80.0–100.0)
Monocytes Absolute: 0.8 10*3/uL (ref 0.1–1.0)
Monocytes Relative: 12 %
Neutro Abs: 4.4 10*3/uL (ref 1.7–7.7)
Neutrophils Relative %: 60 %
Platelets: 153 10*3/uL (ref 150–400)
RBC: 2.68 MIL/uL — ABNORMAL LOW (ref 3.87–5.11)
RDW: 15 % (ref 11.5–15.5)
WBC: 7.2 10*3/uL (ref 4.0–10.5)
nRBC: 0 % (ref 0.0–0.2)

## 2021-12-20 LAB — BASIC METABOLIC PANEL
Anion gap: 5 (ref 5–15)
BUN: 44 mg/dL — ABNORMAL HIGH (ref 8–23)
CO2: 22 mmol/L (ref 22–32)
Calcium: 8.7 mg/dL — ABNORMAL LOW (ref 8.9–10.3)
Chloride: 111 mmol/L (ref 98–111)
Creatinine, Ser: 1.17 mg/dL — ABNORMAL HIGH (ref 0.44–1.00)
GFR, Estimated: 43 mL/min — ABNORMAL LOW (ref >60)
Glucose, Bld: 106 mg/dL — ABNORMAL HIGH (ref 70–99)
Potassium: 4 mmol/L (ref 3.5–5.1)
Sodium: 138 mmol/L (ref 135–145)

## 2021-12-20 LAB — URINE CULTURE: Culture: >=100000 — AB

## 2021-12-20 LAB — BRAIN NATRIURETIC PEPTIDE: B Natriuretic Peptide: 126.6 pg/mL — ABNORMAL HIGH (ref 0.0–100.0)

## 2021-12-20 MED ORDER — CHLORHEXIDINE GLUCONATE CLOTH 2 % EX PADS
6.0000 | MEDICATED_PAD | Freq: Every day | CUTANEOUS | Status: DC
Start: 2021-12-20 — End: 2021-12-21
  Administered 2021-12-20: 6 via TOPICAL

## 2021-12-20 MED ORDER — CEFADROXIL 500 MG PO CAPS
1000.0000 mg | ORAL_CAPSULE | Freq: Every day | ORAL | Status: DC
  Administered 2021-12-21: 1000 mg via ORAL
  Filled 2021-12-20: qty 2

## 2021-12-20 MED ORDER — MELATONIN 3 MG PO TABS
3.0000 mg | ORAL_TABLET | Freq: Every day | ORAL | Status: DC
  Administered 2021-12-20: 3 mg via ORAL
  Filled 2021-12-20: qty 1

## 2021-12-20 MED ORDER — TAMSULOSIN HCL 0.4 MG PO CAPS
0.4000 mg | ORAL_CAPSULE | Freq: Every day | ORAL | Status: DC
  Administered 2021-12-20 – 2021-12-21 (×2): 0.4 mg via ORAL
  Filled 2021-12-20 (×2): qty 1

## 2021-12-20 NOTE — Progress Notes (Addendum)
PROGRESS NOTE Evelyn [name]  [id number] DOB: [birth year] DOA: 12/17/2021 PCP: Wardell Honour, MD   Brief Narrative/Hospital Course: 88 y.o.f w/ hyperlipidemia, recurrent UTIs, osteoarthritis, depression, hypertension, anemia who presented from SNF after mechanical fall while trying to get out of bed to use the restroom, found to have left femoral neck fracture displaced.  Orthopedic was consulted and patient was admitted. In  ED- UA wbc 11-20, leukocytes small and nitrite positive, labs with chronic anemia hemoglobin 10 g, mild leukocytosis 11.7, creatinine 1.1 with bicarb 17.  Patient underwent left hip hemiarthroplasty by Dr. Lorin Mercy. Plan for return to skilled nursing facility once cleared by PT and PSAAR level 2 received    Subjective: Seen this am No new complaints May be little short of breath she says Overnight afebrile, saturating well on 2 L nasal cannula, hemoglobin downtrending 8.7 g leukocytosis resolved, creatinine 1.1  Assessment and Plan: Principal Problem:   Fracture of femoral neck, left (HCC) Active Problems:   Depression with somatization   Chronic idiopathic constipation   Cognitive impairment   HTN (hypertension)   Pyuria   Fever   Macrocytic anemia   Malnutrition of moderate degree   Fracture of femoral neck, left: 2/2 mechanical fall,s/p left hip hemiarthroplasty. Cont with postop DVT prophylaxis w/ aspirin  325 mg daily along with pain medication, bowel regimen PT OT as per orthopedic surgery.Planning for skilled nursing facility once available.  Mild hypoxia- needing [place] at 2l- check cxr, had basal crackles. Cont IS. Check bnp. Cont on asa, scd.  HTN: Well-controlled continue HCTZ. hold Lasix.  E. coli UTI w/ Fever 101.2 at 5 PM 6/29:Cont ceftriaxone pending culture sensitivity.  AKI with metabolic acidosis: Creat improved to 1.1, bicarb normal.  DC IV fluids encourage p.o. Recent Labs  Lab 12/17/21 0430 12/18/21 0316 12/19/21 0313 12/20/21 0317   BUN 36* 26* 39* 44*  CREATININE 1.12* 0.84 1.21* 1.17*    Microcytic anemia, with acute blood loss anemia in the setting of hip fracture and surgery and expected operative blood loss. Transfuse if less than 7 g.  B12 normal, continue folate  Recent Labs  Lab 12/17/21 0430 12/18/21 0316 12/19/21 0313  HGB 10.4* 10.1* 8.7*  HCT 31.9* 32.0* 28.2*    Depression with somatization Cognitive impairment: Continue fall precaution delirium precaution PT OT.  Chronic idiopathic constipation: cont Bowel regimen Urine retention needing a note Following Foley removal 6/1- will put back foley in- likely form uti and and from post hip fracture due to minimal ambulation, added flomax.  Moderate malnutrition augment diet as below Nutrition Problem: Moderate Malnutrition Etiology: chronic illness Signs/Symptoms: moderate fat depletion, severe muscle depletion Interventions: Ensure Enlive (each supplement provides 350kcal and 20 grams of protein), MVI  Goals of care: Confirmed with patient's daughter and changed to DNR.  DVT prophylaxis:asa 325 mg daily- SCDs Start: 12/17/21 2104 SCDs Start: 12/17/21 0926 Code Status:   Code Status: Full Code Family Communication: plan of care discussed with patient at bedside.  Daughter updated over the phone Patient status is: Inpatient because of hip surgery and postop care Level of care: Med-Surg   Dispo: The patient is from: Skilled nursing facility            Anticipated disposition: SNF once available after urine culture results.  Likely Monday if approved  Mobility Assessment (last 72 hours)     Mobility Assessment     Row Name 12/20/21 0758 12/19/21 2300 12/19/21 0726 12/18/21 2025 12/18/21 1331   Does patient have  an order for bedrest or is patient medically unstable No - Continue assessment No - Continue assessment No - Continue assessment No - Continue assessment --   What is the highest level of mobility based on the progressive mobility  assessment? Level 4 (Walks with assist in room) - Balance while marching in place and cannot step forward and back - Complete Level 4 (Walks with assist in room) - Balance while marching in place and cannot step forward and back - Complete Level 4 (Walks with assist in room) - Balance while marching in place and cannot step forward and back - Complete Level 4 (Walks with assist in room) - Balance while marching in place and cannot step forward and back - Complete Level 4 (Walks with assist in room) - Balance while marching in place and cannot step forward and back - Complete   Is the above level different from baseline mobility prior to current illness? Yes - Recommend PT order Yes - Recommend PT order Yes - Recommend PT order Yes - Recommend PT order --    [place] Name 12/18/21 1323 12/18/21 1200 12/18/21 0815 12/17/21 2058 12/17/21 1132   Does patient have an order for bedrest or is patient medically unstable -- -- -- No - Continue assessment Yes- Bedfast (Level 1) - Complete   What is the highest level of mobility based on the progressive mobility assessment? Level 4 (Walks with assist in room) - Balance while marching in place and cannot step forward and back - Complete Level 4 (Walks with assist in room) - Balance while marching in place and cannot step forward and back - Complete Level 4 (Walks with assist in room) - Balance while marching in place and cannot step forward and back - Complete Level 3 (Stands with assist) - Balance while standing  and cannot march in place --   Is the above level different from baseline mobility prior to current illness? -- -- -- Yes - Recommend PT order --             Objective: Vitals last 24 hrs: Vitals:   12/19/21 1300 12/19/21 1345 12/19/21 1516 12/19/21 2211  BP: (!) 97/45 (!) 110/52  (!) 125/56  Pulse: 77   79  Resp: 16 18  18   Temp: 98.2 F (36.8 C)   98.1 F (36.7 C)  TempSrc: Oral   Oral  SpO2: 91%  93% 94%  Weight:      Height:       Weight  change:   Physical Examination: General exam: AAox3, older than stated age, weak appearing. HEENT:Oral mucosa moist, Ear/Nose WNL grossly, dentition normal. Respiratory system: bilaterally basal crackles, on [place], no use of accessory muscle Cardiovascular system: S1 & S2 +, No JVD,. Gastrointestinal system: Abdomen soft,NT,ND,BS+ Nervous System:Alert, awake, moving extremities and grossly nonfocal Extremities: LE ankle edema neg, distal peripheral pulses palpable.  Skin: No rashes,no icterus. MSK: Normal muscle bulk,tone, power   Medications reviewed:  Scheduled Meds:  aspirin EC  325 mg Oral Q breakfast   cholecalciferol  1,000 Units Oral Daily   docusate sodium  100 mg Oral BID   feeding supplement  237 mL Oral BID BM   FLUoxetine  20 mg Oral Daily   folic acid  1 mg Oral Daily   hydrochlorothiazide  12.5 mg Oral Daily   multivitamin with minerals  1 tablet Oral Daily   oxybutynin  2.5 mg Oral TID   pantoprazole  40 mg Oral QAC breakfast  polyethylene glycol  17 g Oral Daily   tamsulosin  0.4 mg Oral Daily   Continuous Infusions:  sodium chloride 75 mL/hr at 12/20/21 0758   cefTRIAXone (ROCEPHIN)  IV Stopped (12/19/21 5366)      Diet Order             Diet regular Room service appropriate? Yes; Fluid consistency: Thin  Diet effective now                    Nutrition Problem: Moderate Malnutrition Etiology: chronic illness Signs/Symptoms: moderate fat depletion, severe muscle depletion Interventions: Ensure Enlive (each supplement provides 350kcal and 20 grams of protein), MVI   Intake/Output Summary (Last 24 hours) at 12/20/2021 0932 Last data filed at 12/20/2021 0758 Gross per 24 hour  Intake 3049.92 ml  Output 600 ml  Net 2449.92 ml   Net IO Since Admission: 1,215.61 mL [12/20/21 0932]  Wt Readings from Last 3 Encounters:  12/17/21 47.7 kg  12/10/21 47.7 kg  11/05/21 47.5 kg     Unresulted Labs (From admission, onward)     Start     Ordered    12/21/21 0500  CBC  Daily,   R      12/20/21 0732          Data Reviewed: I have personally reviewed following labs and imaging studies CBC: Recent Labs  Lab 12/17/21 0430 12/18/21 0316 12/19/21 0313  WBC 11.7* 11.3* 8.7  NEUTROABS 10.2*  --   --   HGB 10.4* 10.1* 8.7*  HCT 31.9* 32.0* 28.2*  MCV 98.8 100.6* 103.7*  PLT 196 167 145*   Basic Metabolic Panel: Recent Labs  Lab 12/17/21 0430 12/18/21 0316 12/19/21 0313 12/20/21 0317  NA 139 135 138 138  K 4.3 4.2 4.1 4.0  CL 112* 109 111 111  CO2 17* 20* 19* 22  GLUCOSE 121* 159* 108* 106*  BUN 36* 26* 39* 44*  CREATININE 1.12* 0.84 1.21* 1.17*  CALCIUM 9.0 8.4* 9.0 8.7*   GFR: Estimated Creatinine Clearance: 21.7 mL/min (A) (by C-G formula based on SCr of 1.17 mg/dL (H)). Liver Function Tests: No results for input(s): "AST", "ALT", "ALKPHOS", "BILITOT", "PROT", "ALBUMIN" in the last 168 hours. No results for input(s): "LIPASE", "AMYLASE" in the last 168 hours. No results for input(s): "AMMONIA" in the last 168 hours. Coagulation Profile: Recent Labs  Lab 12/17/21 0742  INR 1.4*   BNP (last 3 results) No results for input(s): "PROBNP" in the last 8760 hours. HbA1C: No results for input(s): "HGBA1C" in the last 72 hours. CBG: No results for input(s): "GLUCAP" in the last 168 hours. Lipid Profile: No results for input(s): "CHOL", "HDL", "LDLCALC", "TRIG", "CHOLHDL", "LDLDIRECT" in the last 72 hours. Thyroid Function Tests: No results for input(s): "TSH", "T4TOTAL", "FREET4", "T3FREE", "THYROIDAB" in the last 72 hours. Sepsis Labs: No results for input(s): "PROCALCITON", "LATICACIDVEN" in the last 168 hours.  Recent Results (from the past 240 hour(s))  MRSA Next Gen by PCR, Nasal     Status: None   Collection Time: 12/17/21 11:54 AM   Specimen: Nasal Mucosa; Nasal Swab  Result Value Ref Range Status   MRSA by PCR Next Gen NOT DETECTED NOT DETECTED Final    Comment: (NOTE) The GeneXpert MRSA Assay (FDA  approved for NASAL specimens only), is one component of a comprehensive MRSA colonization surveillance program. It is not intended to diagnose MRSA infection nor to guide or monitor treatment for MRSA infections. Test performance is not FDA approved in  patients less than 15 years old. Performed at [facility], [address], [place], [place] [address]   Urine Culture     Status: Abnormal   Collection Time: 12/18/21  1:11 PM   Specimen: Urine, Clean Catch  Result Value Ref Range Status   Specimen Description   Final    URINE, CLEAN CATCH Performed at [facility], [address], [place], [place] [address]    Special Requests   Final    NONE Performed at [facility], [address] [place], [place] [address]    Culture >=100,000 COLONIES/mL ESCHERICHIA COLI (A)  Final   Report Status 12/20/2021 FINAL  Final   Organism ID, Bacteria ESCHERICHIA COLI (A)  Final      Susceptibility   Escherichia coli - MIC*    AMPICILLIN <=2 SENSITIVE Sensitive     CEFAZOLIN <=4 SENSITIVE Sensitive     CEFEPIME <=0.12 SENSITIVE Sensitive     CEFTRIAXONE <=0.25 SENSITIVE Sensitive     CIPROFLOXACIN <=0.25 SENSITIVE Sensitive     GENTAMICIN <=1 SENSITIVE Sensitive     IMIPENEM <=0.25 SENSITIVE Sensitive     NITROFURANTOIN <=16 SENSITIVE Sensitive     TRIMETH/SULFA <=20 SENSITIVE Sensitive     AMPICILLIN/SULBACTAM <=2 SENSITIVE Sensitive     PIP/TAZO <=4 SENSITIVE Sensitive     * >=100,000 COLONIES/mL ESCHERICHIA COLI    Antimicrobials: Anti-infectives (From admission, onward)    Start     Dose/Rate Route Frequency Ordered Stop   12/18/21 1500  cefTRIAXone (ROCEPHIN) 1 g in sodium chloride 0.9 % 100 mL IVPB        1 g 200 mL/hr over 30 Minutes Intravenous Daily 12/18/21 1354     12/18/21 0600  ceFAZolin (ANCEF) IVPB 2g/100 mL premix        2 g 200 mL/hr over 30 Minutes Intravenous On call to O.R. 12/17/21 1124 12/17/21 1847       Culture/Microbiology    Component Value Date/Time   SDES  12/18/2021 1311    URINE, CLEAN CATCH Performed at [facility], [address] [place], [place] [address]    SPECREQUEST  12/18/2021 1311    NONE Performed at [facility], [address], [place], [place] [address]    CULT >=100,000 COLONIES/mL ESCHERICHIA COLI (A) 12/18/2021 1311   REPTSTATUS 12/20/2021 FINAL 12/18/2021 1311    Other culture-see note  Radiology Studies: No results found.   LOS: 3 days   Lanae Boast, MD [facility]  12/20/2021, 9:32 AM

## 2021-12-20 NOTE — Progress Notes (Signed)
Rn discussed code status with pt daughter and daughter states she has a golden rod dnr at home. She states he mom does not want to be brought back if something happens to her. Rn will discuss this with md.

## 2021-12-20 NOTE — Progress Notes (Signed)
Physical Therapy Treatment Patient Details Name: Evelyn [name] MRN: [id number] DOB: December 15, 1924 Today's Date: 12/20/2021   History of Present Illness Pt admitted from [facility] (rehab for recovery from recent fall with compression fx).  Pt s/p fall with L hip fx and now s/p hemi-arthroplasty by posterior approach.    PT Comments    Pt continues very cooperative but ltd by fatigue and increased anxiety regarding falling.  This pm, pt tolerated ambulating limited distance from chair to return to bed.  [facility]  Recommendations for follow up therapy are one component of a multi-disciplinary discharge planning process, led by the attending physician.  Recommendations may be updated based on patient status, additional functional criteria and insurance authorization.  Follow Up Recommendations  Skilled nursing-short term rehab (<3 hours/day) Can patient physically be transported by private vehicle: No   Assistance Recommended at Discharge Frequent or constant Supervision/Assistance  Patient can return home with the following A lot of help with walking and/or transfers;A little help with bathing/dressing/bathroom;Assistance with cooking/housework;Assist for transportation;Help with stairs or ramp for entrance   Equipment Recommendations  None recommended by PT    Recommendations for Other Services       Precautions / Restrictions Precautions Precautions: Fall;Posterior Hip Required Braces or Orthoses: Knee Immobilizer - Left Restrictions Weight Bearing Restrictions: No LLE Weight Bearing: Weight bearing as tolerated     Mobility  Bed Mobility Overal bed mobility: Needs Assistance Bed Mobility: Sit to Supine     Supine to sit: Min assist, Mod assist, +2 for physical assistance, +2 for safety/equipment Sit to supine: Mod assist, +2 for safety/equipment   General bed mobility comments: Increased time with cues for sequence and use of R LE  to self assist.  Physical assist to manage L LE, to control trunk and to complete rotation using bed pad    Transfers Overall transfer level: Needs assistance Equipment used: Rolling walker (2 wheels) Transfers: Sit to/from Stand Sit to Stand: Min assist, +2 physical assistance, From elevated surface           General transfer comment: cues for LE management and use of UEs to self assist. Physical assist to bring wt up and fwd and to balance in initial standing    Ambulation/Gait Ambulation/Gait assistance: Min assist, +2 safety/equipment Gait Distance (Feet): 5 Feet Assistive device: Rolling walker (2 wheels) Gait Pattern/deviations: Step-to pattern, Decreased step length - right, Decreased step length - left, Shuffle, Decreased stance time - left, Antalgic, Trunk flexed Gait velocity: decr     General Gait Details: cues for posture, sequence and position from RW; distance ltd by c/o fatigue   Stairs             Wheelchair Mobility    Modified Rankin (Stroke Patients Only)       Balance Overall balance assessment: Needs assistance Sitting-balance support: No upper extremity supported, Feet supported Sitting balance-Leahy Scale: Fair     Standing balance support: Bilateral upper extremity supported Standing balance-Leahy Scale: Poor                              Cognition Arousal/Alertness: Awake/alert Behavior During Therapy: WFL for tasks assessed/performed Overall Cognitive Status: Within Functional Limits for tasks assessed  Exercises Total Joint Exercises Ankle Circles/Pumps: AROM, Both, 15 reps, Supine Quad Sets: AROM, Both, 10 reps, Supine Heel Slides: AAROM, Left, 20 reps, Supine Hip ABduction/ADduction: AAROM, Left, 15 reps, Supine    General Comments        Pertinent Vitals/Pain Pain Assessment Pain Assessment: 0-10 Pain Score: 3  Pain Location: LT hip Pain  Descriptors / Indicators: Aching, Sore Pain Intervention(s): Limited activity within patient's tolerance, Monitored during session, Premedicated before session, Ice applied    Home Living                          Prior Function            PT Goals (current goals can now be found in the care plan section) Acute Rehab PT Goals Patient Stated Goal: Regain IND PT Goal Formulation: With patient Time For Goal Achievement: 12/27/21 Potential to Achieve Goals: Good Progress towards PT goals: Progressing toward goals    Frequency    Min 3X/week      PT Plan Current plan remains appropriate    Co-evaluation              AM-PAC PT "6 Clicks" Mobility   Outcome Measure  Help needed turning from your back to your side while in a flat bed without using bedrails?: A Lot Help needed moving from lying on your back to sitting on the side of a flat bed without using bedrails?: A Lot Help needed moving to and from a bed to a chair (including a wheelchair)?: A Lot Help needed standing up from a chair using your arms (e.g., wheelchair or bedside chair)?: A Little Help needed to walk in hospital room?: Total Help needed climbing 3-5 steps with a railing? : Total 6 Click Score: 11    End of Session Equipment Utilized During Treatment: Gait belt Activity Tolerance: Patient limited by fatigue Patient left: with call bell/phone within reach;in bed;with bed alarm set Nurse Communication: Mobility status PT Visit Diagnosis: Unsteadiness on feet (R26.81);Difficulty in walking, not elsewhere classified (R26.2)     Time: 1610-9604 PT Time Calculation (min) (ACUTE ONLY): 15 min  Charges:  $Gait Training: 8-22 mins $Therapeutic Exercise: 8-22 mins                     Mauro Kaufmann PT Acute Rehabilitation Services Pager [phone] Office [phone]    Evelyn [name] 12/20/2021, 3:18 PM

## 2021-12-20 NOTE — Plan of Care (Signed)
  Problem: Clinical Measurements: Goal: Diagnostic test results will improve Outcome: Progressing   Problem: Clinical Measurements: Goal: Respiratory complications will improve Outcome: Progressing   Problem: Clinical Measurements: Goal: Cardiovascular complication will be avoided Outcome: Progressing   

## 2021-12-20 NOTE — TOC Progression Note (Signed)
Transition of Care [facility]) - Progression Note    Patient Details  Name: Evelyn [name] MRN: [id number] Date of Birth: 1924/07/14  [facility] CM/SW Contact  Darleene Cleaver, [place] Phone Number: 12/20/2021, 5:42 PM  Clinical Narrative:     [facility] she is getting [facility] rehab.  Plan to return once medically stable, per SNF, patient can return Monday if she is ready for discharge.  Expected Discharge Plan: Skilled Nursing Facility Barriers to Discharge: Continued Medical Work up  Expected Discharge Plan and Services Expected Discharge Plan: Skilled Nursing Facility In-house Referral: Clinical Social Work   Post Acute Care Choice: Skilled Nursing Facility Living arrangements for the past 2 months: Independent Living Facility                 DME Arranged: N/A DME Agency: NA                   Social Determinants of Health (SDOH) Interventions    Readmission Risk Interventions     No data to display

## 2021-12-20 NOTE — Progress Notes (Signed)
Md notified of pt bladder scan of 554. Foley to be placed for retention.

## 2021-12-20 NOTE — Progress Notes (Signed)
Physical Therapy Treatment Patient Details Name: Evelyn [name] MRN: [id number] DOB: Nov 20, 1924 Today's Date: 12/20/2021   History of Present Illness Pt admitted from [facility] (rehab for recovery from recent fall with compression fx).  Pt s/p fall with L hip fx and now s/p hemi-arthroplasty by posterior approach.    PT Comments    Pt cooperative and progressing with mobility.  Pt performed therex program with assist, assisted to EOB sitting, to standing and to walking limited distance into hall.  Pt with no c/o dizziness with BP supine 118/47, sitting 118/48, standing 109/54 and after walking 119/51.  Pt on RA and maintained O2 sats at 93% or higher.  RN aware.  Recommendations for follow up therapy are one component of a multi-disciplinary discharge planning process, led by the attending physician.  Recommendations may be updated based on patient status, additional functional criteria and insurance authorization.  Follow Up Recommendations  Skilled nursing-short term rehab (<3 hours/day) Can patient physically be transported by private vehicle: No   Assistance Recommended at Discharge Frequent or constant Supervision/Assistance  Patient can return home with the following A lot of help with walking and/or transfers;A little help with bathing/dressing/bathroom;Assistance with cooking/housework;Assist for transportation;Help with stairs or ramp for entrance   Equipment Recommendations  None recommended by PT    Recommendations for Other Services       Precautions / Restrictions Precautions Precautions: Fall;Posterior Hip Required Braces or Orthoses: Knee Immobilizer - Left Restrictions Weight Bearing Restrictions: No LLE Weight Bearing: Weight bearing as tolerated     Mobility  Bed Mobility Overal bed mobility: Needs Assistance Bed Mobility: Supine to Sit     Supine to sit: Min assist, Mod assist, +2 for physical assistance, +2 for safety/equipment     General bed  mobility comments: Increased time with cues for sequence and use of R LE to self assist.  Physical assist to manage L LE, to control trunk and to complete rotation using bed pad    Transfers Overall transfer level: Needs assistance Equipment used: Rolling walker (2 wheels) Transfers: Sit to/from Stand Sit to Stand: Min assist, +2 physical assistance, From elevated surface           General transfer comment: cues for LE management and use of UEs to self assist. Physical assist to bring wt up and fwd and to balance in initial standing    Ambulation/Gait Ambulation/Gait assistance: Min assist, +2 safety/equipment Gait Distance (Feet): 14 Feet Assistive device: Rolling walker (2 wheels) Gait Pattern/deviations: Step-to pattern, Decreased step length - right, Decreased step length - left, Shuffle, Decreased stance time - left, Antalgic, Trunk flexed Gait velocity: decr     General Gait Details: cues for posture, sequence and position from RW; distance ltd by c/o fatigue   Stairs             Wheelchair Mobility    Modified Rankin (Stroke Patients Only)       Balance Overall balance assessment: Needs assistance Sitting-balance support: No upper extremity supported, Feet supported Sitting balance-Leahy Scale: Fair     Standing balance support: Bilateral upper extremity supported Standing balance-Leahy Scale: Poor                              Cognition Arousal/Alertness: Awake/alert Behavior During Therapy: WFL for tasks assessed/performed Overall Cognitive Status: Within Functional Limits for tasks assessed  Exercises Total Joint Exercises Ankle Circles/Pumps: AROM, Both, 15 reps, Supine Quad Sets: AROM, Both, 10 reps, Supine Heel Slides: AAROM, Left, 20 reps, Supine Hip ABduction/ADduction: AAROM, Left, 15 reps, Supine    General Comments        Pertinent Vitals/Pain Pain  Assessment Pain Assessment: 0-10 Pain Score: 3  Pain Location: LT hip Pain Descriptors / Indicators: Aching, Sore Pain Intervention(s): Limited activity within patient's tolerance, Premedicated before session, Monitored during session    Home Living                          Prior Function            PT Goals (current goals can now be found in the care plan section) Acute Rehab PT Goals Patient Stated Goal: Regain IND PT Goal Formulation: With patient Time For Goal Achievement: 12/27/21 Potential to Achieve Goals: Good Progress towards PT goals: Progressing toward goals    Frequency    Min 3X/week      PT Plan Current plan remains appropriate    Co-evaluation              AM-PAC PT "6 Clicks" Mobility   Outcome Measure  Help needed turning from your back to your side while in a flat bed without using bedrails?: A Lot Help needed moving from lying on your back to sitting on the side of a flat bed without using bedrails?: A Lot Help needed moving to and from a bed to a chair (including a wheelchair)?: A Lot Help needed standing up from a chair using your arms (e.g., wheelchair or bedside chair)?: A Little Help needed to walk in hospital room?: Total Help needed climbing 3-5 steps with a railing? : Total 6 Click Score: 11    End of Session Equipment Utilized During Treatment: Gait belt Activity Tolerance: Patient limited by fatigue Patient left: in chair;with call bell/phone within reach;with chair alarm set Nurse Communication: Mobility status PT Visit Diagnosis: Unsteadiness on feet (R26.81);Difficulty in walking, not elsewhere classified (R26.2)     Time: 3154-0086 PT Time Calculation (min) (ACUTE ONLY): 30 min  Charges:  $Gait Training: 8-22 mins $Therapeutic Exercise: 8-22 mins                     Mauro Kaufmann PT Acute Rehabilitation Services Pager [phone] Office [phone]    [facility] 12/20/2021, 12:36 PM

## 2021-12-20 NOTE — Plan of Care (Signed)
  Problem: Coping: Goal: Level of anxiety will decrease Outcome: Progressing   Problem: Safety: Goal: Ability to remain free from injury will improve Outcome: Progressing   Problem: Pain Managment: Goal: General experience of comfort will improve Outcome: Progressing   

## 2021-12-21 DIAGNOSIS — M6281 Muscle weakness (generalized): Secondary | ICD-10-CM | POA: Diagnosis not present

## 2021-12-21 DIAGNOSIS — M1990 Unspecified osteoarthritis, unspecified site: Secondary | ICD-10-CM | POA: Diagnosis not present

## 2021-12-21 DIAGNOSIS — R2681 Unsteadiness on feet: Secondary | ICD-10-CM | POA: Diagnosis not present

## 2021-12-21 DIAGNOSIS — Z7401 Bed confinement status: Secondary | ICD-10-CM | POA: Diagnosis not present

## 2021-12-21 DIAGNOSIS — M545 Low back pain, unspecified: Secondary | ICD-10-CM | POA: Diagnosis not present

## 2021-12-21 DIAGNOSIS — N189 Chronic kidney disease, unspecified: Secondary | ICD-10-CM | POA: Diagnosis not present

## 2021-12-21 DIAGNOSIS — E785 Hyperlipidemia, unspecified: Secondary | ICD-10-CM | POA: Diagnosis not present

## 2021-12-21 DIAGNOSIS — R4189 Other symptoms and signs involving cognitive functions and awareness: Secondary | ICD-10-CM | POA: Diagnosis not present

## 2021-12-21 DIAGNOSIS — G8929 Other chronic pain: Secondary | ICD-10-CM | POA: Diagnosis not present

## 2021-12-21 DIAGNOSIS — S79929A Unspecified injury of unspecified thigh, initial encounter: Secondary | ICD-10-CM | POA: Diagnosis not present

## 2021-12-21 DIAGNOSIS — I708 Atherosclerosis of other arteries: Secondary | ICD-10-CM | POA: Diagnosis not present

## 2021-12-21 DIAGNOSIS — D72829 Elevated white blood cell count, unspecified: Secondary | ICD-10-CM | POA: Diagnosis not present

## 2021-12-21 DIAGNOSIS — K5900 Constipation, unspecified: Secondary | ICD-10-CM | POA: Diagnosis not present

## 2021-12-21 DIAGNOSIS — K589 Irritable bowel syndrome without diarrhea: Secondary | ICD-10-CM | POA: Diagnosis not present

## 2021-12-21 DIAGNOSIS — S72002D Fracture of unspecified part of neck of left femur, subsequent encounter for closed fracture with routine healing: Secondary | ICD-10-CM | POA: Diagnosis not present

## 2021-12-21 DIAGNOSIS — D649 Anemia, unspecified: Secondary | ICD-10-CM | POA: Diagnosis not present

## 2021-12-21 DIAGNOSIS — S72002A Fracture of unspecified part of neck of left femur, initial encounter for closed fracture: Secondary | ICD-10-CM | POA: Diagnosis not present

## 2021-12-21 DIAGNOSIS — M25552 Pain in left hip: Secondary | ICD-10-CM | POA: Diagnosis not present

## 2021-12-21 DIAGNOSIS — N390 Urinary tract infection, site not specified: Secondary | ICD-10-CM | POA: Diagnosis not present

## 2021-12-21 DIAGNOSIS — H5440 Blindness, one eye, unspecified eye: Secondary | ICD-10-CM | POA: Diagnosis not present

## 2021-12-21 DIAGNOSIS — K279 Peptic ulcer, site unspecified, unspecified as acute or chronic, without hemorrhage or perforation: Secondary | ICD-10-CM | POA: Diagnosis not present

## 2021-12-21 DIAGNOSIS — Z741 Need for assistance with personal care: Secondary | ICD-10-CM | POA: Diagnosis not present

## 2021-12-21 DIAGNOSIS — G4700 Insomnia, unspecified: Secondary | ICD-10-CM | POA: Diagnosis not present

## 2021-12-21 DIAGNOSIS — E876 Hypokalemia: Secondary | ICD-10-CM | POA: Diagnosis not present

## 2021-12-21 DIAGNOSIS — S32030D Wedge compression fracture of third lumbar vertebra, subsequent encounter for fracture with routine healing: Secondary | ICD-10-CM | POA: Diagnosis not present

## 2021-12-21 DIAGNOSIS — F32A Depression, unspecified: Secondary | ICD-10-CM | POA: Diagnosis not present

## 2021-12-21 DIAGNOSIS — S22010D Wedge compression fracture of first thoracic vertebra, subsequent encounter for fracture with routine healing: Secondary | ICD-10-CM | POA: Diagnosis not present

## 2021-12-21 DIAGNOSIS — K551 Chronic vascular disorders of intestine: Secondary | ICD-10-CM | POA: Diagnosis not present

## 2021-12-21 DIAGNOSIS — R41841 Cognitive communication deficit: Secondary | ICD-10-CM | POA: Diagnosis not present

## 2021-12-21 DIAGNOSIS — Z9181 History of falling: Secondary | ICD-10-CM | POA: Diagnosis not present

## 2021-12-21 DIAGNOSIS — R5381 Other malaise: Secondary | ICD-10-CM | POA: Diagnosis not present

## 2021-12-21 DIAGNOSIS — I129 Hypertensive chronic kidney disease with stage 1 through stage 4 chronic kidney disease, or unspecified chronic kidney disease: Secondary | ICD-10-CM | POA: Diagnosis not present

## 2021-12-21 DIAGNOSIS — R262 Difficulty in walking, not elsewhere classified: Secondary | ICD-10-CM | POA: Diagnosis not present

## 2021-12-21 DIAGNOSIS — E872 Acidosis, unspecified: Secondary | ICD-10-CM | POA: Diagnosis not present

## 2021-12-21 DIAGNOSIS — I10 Essential (primary) hypertension: Secondary | ICD-10-CM | POA: Diagnosis not present

## 2021-12-21 DIAGNOSIS — R339 Retention of urine, unspecified: Secondary | ICD-10-CM | POA: Diagnosis not present

## 2021-12-21 DIAGNOSIS — S72042D Displaced fracture of base of neck of left femur, subsequent encounter for closed fracture with routine healing: Secondary | ICD-10-CM | POA: Diagnosis not present

## 2021-12-21 DIAGNOSIS — N179 Acute kidney failure, unspecified: Secondary | ICD-10-CM | POA: Diagnosis not present

## 2021-12-21 DIAGNOSIS — K219 Gastro-esophageal reflux disease without esophagitis: Secondary | ICD-10-CM | POA: Diagnosis not present

## 2021-12-21 DIAGNOSIS — E559 Vitamin D deficiency, unspecified: Secondary | ICD-10-CM | POA: Diagnosis not present

## 2021-12-21 DIAGNOSIS — I7090 Unspecified atherosclerosis: Secondary | ICD-10-CM | POA: Diagnosis not present

## 2021-12-21 DIAGNOSIS — N1831 Chronic kidney disease, stage 3a: Secondary | ICD-10-CM | POA: Diagnosis not present

## 2021-12-21 DIAGNOSIS — N3281 Overactive bladder: Secondary | ICD-10-CM | POA: Diagnosis not present

## 2021-12-21 DIAGNOSIS — R3911 Hesitancy of micturition: Secondary | ICD-10-CM | POA: Diagnosis not present

## 2021-12-21 DIAGNOSIS — K5904 Chronic idiopathic constipation: Secondary | ICD-10-CM | POA: Diagnosis not present

## 2021-12-21 LAB — CBC
HCT: 28.6 % — ABNORMAL LOW (ref 36.0–46.0)
Hemoglobin: 9.1 g/dL — ABNORMAL LOW (ref 12.0–15.0)
MCH: 31.8 pg (ref 26.0–34.0)
MCHC: 31.8 g/dL (ref 30.0–36.0)
MCV: 100 fL (ref 80.0–100.0)
Platelets: 190 10*3/uL (ref 150–400)
RBC: 2.86 MIL/uL — ABNORMAL LOW (ref 3.87–5.11)
RDW: 14.6 % (ref 11.5–15.5)
WBC: 7.9 10*3/uL (ref 4.0–10.5)
nRBC: 0 % (ref 0.0–0.2)

## 2021-12-21 MED ORDER — ADULT MULTIVITAMIN W/MINERALS CH: 1.0000 | ORAL_TABLET | Freq: Every day | ORAL | Status: DC

## 2021-12-21 MED ORDER — CEFADROXIL 500 MG PO CAPS: 1000.0000 mg | ORAL_CAPSULE | Freq: Every day | ORAL | 0 refills | Status: AC

## 2021-12-21 MED ORDER — FOLIC ACID 1 MG PO TABS: 1.0000 mg | ORAL_TABLET | Freq: Every day | ORAL | 0 refills | Status: AC

## 2021-12-21 MED ORDER — TAMSULOSIN HCL 0.4 MG PO CAPS: 0.4000 mg | ORAL_CAPSULE | Freq: Every day | ORAL | Status: AC

## 2021-12-21 NOTE — Discharge Summary (Signed)
Physician Discharge Summary  KATHRINA CROSLEY PJA:[id number] DOB: 27-Mar-1925 DOA: 12/17/2021  PCP: Frederica Kuster, MD  Admit date: 12/17/2021 Discharge date: 12/21/2021 Recommendations for Outpatient Follow-up:  Follow up with PCP in 1 weeks-call for appointment Please obtain BMP/CBC in one week  Discharge Dispo: SNF Discharge Condition: Stable Code Status:   Code Status: DNR Diet recommendation:  Diet Order             Diet - low sodium heart healthy           Diet regular Room service appropriate? Yes; Fluid consistency: Thin  Diet effective now                   Brief/Interim Summary:88 y.o.f w/ hyperlipidemia, recurrent UTIs, osteoarthritis, depression, hypertension, anemia who presented from SNF after mechanical fall while trying to get out of bed to use the restroom, found to have left femoral neck fracture displaced.  Orthopedic was consulted and patient was admitted. In  ED- UA wbc 11-20, leukocytes small and nitrite positive, labs with chronic anemia hemoglobin 10 g, mild leukocytosis 11.7, creatinine 1.1 with bicarb 17.  Patient underwent left hip hemiarthroplasty by Dr. Ophelia Charter. Plan for return to skilled nursing facility once cleared by PT and PSAAR level 2 received.  Urine culture came back with E. coli.  Transitioned to oral antibiotics.  Remains hemodynamically stable on room air.  At this time she is medically stable and is being discharged to skilled nursing facility.   Discharge Diagnoses:  Principal Problem:   Fracture of femoral neck, left (HCC) Active Problems:   Depression with somatization   Chronic idiopathic constipation   Cognitive impairment   HTN (hypertension)   Pyuria   Fever   Macrocytic anemia   Malnutrition of moderate degree  Fracture of femoral neck, left: 2/2 mechanical fall,s/p left hip hemiarthroplasty. Cont with postop DVT prophylaxis w/ aspirin  325 mg daily along with pain medication, bowel regimen PT OT as per orthopedic surgery.Planning  for skilled nursing facility once available.   Mild hypoxia- needing Sobieski at 2l--resolved likely from lack of deep breathing encourage incentive spirometry.    HTN: Well-controlled continue HCTZ.  Resume Lasix.   E. coli UTI w/ Fever 101.2 at 5 PM 6/29: Changed to p.o. antibiotics to complete the course    AKI with metabolic acidosis: Creat improved to 1.1, bicarb normal.  OFF IV fluids encourage p.o.  Microcytic anemia, with acute blood loss anemia in the setting of hip fracture and surgery and expected operative blood loss. Transfuse if less than 7 g.  B12 normal, continue folate.  CBC in 1 week Recent Labs  Lab 12/17/21 0430 12/18/21 0316 12/19/21 0313 12/20/21 0317 12/21/21 0312  HGB 10.4* 10.1* 8.7* 8.5* 9.1*  HCT 31.9* 32.0* 28.2* 27.8* 28.6*      Depression with somatization Cognitive impairment: Continue fall precaution delirium precaution PT OT.   Chronic idiopathic constipation: cont Bowel regimen Urine retention needing a note Following Foley removal 6/1-failed voiding trial Foley reinserted 7/2  likely form uti and and from post hip fracture due to minimal ambulation, added flomax.  Routine voiding trial in 6 days at the facility feels if fails again she needs urology referral   Moderate malnutrition augment diet as below Nutrition Problem: Moderate Malnutrition Etiology: chronic illness Signs/Symptoms: moderate fat depletion, severe muscle depletion Interventions: Ensure Enlive (each supplement provides 350kcal and 20 grams of protein), MVI    Consults: ORTHOPEDICS Subjective: AAOX3, no new complaints on RA Foley+  Remains afebrile on room air  Discharge Exam: Vitals:   12/21/21 0036 12/21/21 0630  BP: (!) 156/70 (!) 148/74  Pulse: 80 81  Resp: 17 20  Temp: 98 F (36.7 C) 97.9 F (36.6 C)  SpO2: (!) 88% 97%   General: Pt is alert, awake, not in acute distress Cardiovascular: RRR, S1/S2 +, no rubs, no gallops Respiratory: CTA bilaterally, no wheezing,  no rhonchi Abdominal: Soft, NT, ND, bowel sounds + Extremities: no edema, no cyanosis  Discharge Instructions  Discharge Instructions     Diet - low sodium heart healthy   Complete by: As directed    Discharge instructions   Complete by: As directed    CBC, BMP CHECK FROM PCP  IN 1 WK Doing voiding trial with Foley removal once more ambulatory at the SNF in 6 days-IF SHE fails voiding trial will need urology follow-up.  Continue Flomax  Please call call MD or return to ER for similar or worsening recurring problem that brought you to hospital or if any fever,nausea/vomiting,abdominal pain, uncontrolled pain, chest pain,  shortness of breath or any other alarming symptoms.  Please follow-up your doctor as instructed in a week time and call the office for appointment.  Please avoid alcohol, smoking, or any other illicit substance and maintain healthy habits including taking your regular medications as prescribed.  You were cared for by a hospitalist during your hospital stay. If you have any questions about your discharge medications or the care you received while you were in the hospital after you are discharged, you can call the unit and ask to speak with the hospitalist on call if the hospitalist that took care of you is not available.  Once you are discharged, your primary care physician will handle any further medical issues. Please note that NO REFILLS for any discharge medications will be authorized once you are discharged, as it is imperative that you return to your primary care physician (or establish a relationship with a primary care physician if you do not have one) for your aftercare needs so that they can reassess your need for medications and monitor your lab values   Discharge wound care:   Complete by: As directed    Reinforce dressing until removed on 2 weeks postop with follow-up with orthopedics   Increase activity slowly   Complete by: As directed       Allergies as  of 12/21/2021   No Known Allergies      Medication List     STOP taking these medications    acetaminophen 500 MG tablet Commonly known as: TYLENOL       TAKE these medications    aspirin EC 325 MG tablet Take 1 tablet (325 mg total) by mouth daily with breakfast. MUST TAKE AT LEAST 4 WEEKS POSTOP FOR DVT PROPHYLAXIS   cefadroxil 500 MG capsule Commonly known as: DURICEF Take 2 capsules (1,000 mg total) by mouth daily for 3 days.   cholecalciferol 25 MCG (1000 UNIT) tablet Commonly known as: VITAMIN D3 Take 1,000 Units by mouth daily.   docusate sodium 100 MG capsule Commonly known as: COLACE Take 100 mg by mouth in the morning.   FLUoxetine 20 MG capsule Commonly known as: PROZAC Take 20 mg by mouth daily.   folic acid 1 MG tablet Commonly known as: FOLVITE Take 1 tablet (1 mg total) by mouth daily.   furosemide 40 MG tablet Commonly known as: LASIX Take 40 mg by mouth daily.   hydrochlorothiazide 12.5  MG capsule Commonly known as: MICROZIDE Take 12.5 mg by mouth daily.   HYDROcodone-acetaminophen 5-325 MG tablet Commonly known as: NORCO/VICODIN Take 1 tablet by mouth every 6 (six) hours as needed for moderate pain.   multivitamin with minerals Tabs tablet Take 1 tablet by mouth daily.   Oxybutynin Chloride 2.5 MG Tabs Take 2.5 mg by mouth 3 (three) times daily.   pantoprazole 40 MG tablet Commonly known as: PROTONIX Take 1 tablet (40 mg total) by mouth daily before breakfast.   polyethylene glycol 17 g packet Commonly known as: MIRALAX / GLYCOLAX Take 17 g by mouth daily.   tamsulosin 0.4 MG Caps capsule Commonly known as: FLOMAX Take 1 capsule (0.4 mg total) by mouth daily.   zolpidem 10 MG tablet Commonly known as: AMBIEN Take 1/2 tablet by mouth once daily at bedtime as needed for sleep. What changed:  how much to take how to take this when to take this additional instructions               Discharge Care Instructions  (From  admission, onward)           Start     Ordered   12/21/21 0000  Discharge wound care:       Comments: Reinforce dressing until removed on 2 weeks postop with follow-up with orthopedics   12/21/21 8453            Contact information for follow-up providers     Eldred Manges, MD. Schedule an appointment as soon as possible for a visit today.   Specialty: Orthopedic Surgery Why: need return office visit with Dr Ophelia Charter 2 weeks postop. Contact information: [address] [place] [address] [phone]         Frederica Kuster, MD Follow up in 1 week(s).   Specialties: Family Medicine, Emergency Medicine Why: CBC, BMP CHECK FROM PCP  IN 1 WK Contact information: [address] [place] [address] [phone]              Contact information for after-discharge care     Destination     [facility] Preferred SNF .   Service: Skilled Nursing Contact information: [address] [place] [address] [phone]                    No Known Allergies  The results of significant diagnostics from this hospitalization (including imaging, microbiology, ancillary and laboratory) are listed below for reference.    Microbiology: Recent Results (from the past 240 hour(s))  MRSA Next Gen by PCR, Nasal     Status: None   Collection Time: 12/17/21 11:54 AM   Specimen: Nasal Mucosa; Nasal Swab  Result Value Ref Range Status   MRSA by PCR Next Gen NOT DETECTED NOT DETECTED Final    Comment: (NOTE) The GeneXpert MRSA Assay (FDA approved for NASAL specimens only), is one component of a comprehensive MRSA colonization surveillance program. It is not intended to diagnose MRSA infection nor to guide or monitor treatment for MRSA infections. Test performance is not FDA approved in patients less than 67 years old. Performed at [facility], [address] [place], [place] [address]   Urine  Culture     Status: Abnormal   Collection Time: 12/18/21  1:11 PM   Specimen: Urine, Clean Catch  Result Value Ref Range Status   Specimen Description   Final    URINE, CLEAN CATCH  Performed at [facility], [address] [place], [place] [address]    Special Requests   Final    NONE Performed at [facility], [address] [place], [place] [address]    Culture >=100,000 COLONIES/mL ESCHERICHIA COLI (A)  Final   Report Status 12/20/2021 FINAL  Final   Organism ID, Bacteria ESCHERICHIA COLI (A)  Final      Susceptibility   Escherichia coli - MIC*    AMPICILLIN <=2 SENSITIVE Sensitive     CEFAZOLIN <=4 SENSITIVE Sensitive     CEFEPIME <=0.12 SENSITIVE Sensitive     CEFTRIAXONE <=0.25 SENSITIVE Sensitive     CIPROFLOXACIN <=0.25 SENSITIVE Sensitive     GENTAMICIN <=1 SENSITIVE Sensitive     IMIPENEM <=0.25 SENSITIVE Sensitive     NITROFURANTOIN <=16 SENSITIVE Sensitive     TRIMETH/SULFA <=20 SENSITIVE Sensitive     AMPICILLIN/SULBACTAM <=2 SENSITIVE Sensitive     PIP/TAZO <=4 SENSITIVE Sensitive     * >=100,000 COLONIES/mL ESCHERICHIA COLI    Procedures/Studies: DG Chest Port 1 View  Result Date: 12/20/2021 CLINICAL DATA:  Shortness of breath. EXAM: PORTABLE CHEST 1 VIEW COMPARISON:  Radiographs 12/17/2021 and 07/16/2017. FINDINGS: The heart size and mediastinal contours are stable with a moderate to large hiatal hernia. Mild atelectasis at both lung bases. No edema, pleural effusion or pneumothorax. The bones appear unremarkable. IMPRESSION: Stable moderate to large hiatal hernia. No evidence of acute cardiopulmonary process. Electronically Signed   By: Carey BullocksWilliam  Veazey M.D.   On: 12/20/2021 11:07   Pelvis Portable  Result Date: 12/17/2021 CLINICAL DATA:  Left hip arthroplasty EXAM: PORTABLE PELVIS 1-2 VIEWS COMPARISON:  None Available. FINDINGS: Single orthopedic view of the pelvis demonstrates surgical changes of left hip bipolar  hemiarthroplasty. Arthroplasty component overlies its expected position. No unexpected fracture or dislocation. Severe right hip degenerative arthritis is incidentally noted. Suture anchors related to probable bladder suspension noted within the pubic symphysis. Extensive gas surrounding the left hip is likely postsurgical in nature. Surgical skin staples are noted superolateral to the left hip. IMPRESSION: Status post left hip bipolar hemiarthroplasty. No unexpected fracture or dislocation. Electronically Signed   By: Helyn NumbersAshesh  Parikh M.D.   On: 12/17/2021 20:51   DG Lumbar Spine 2-3 Views  Result Date: 12/17/2021 CLINICAL DATA:  88 year old female status post fall this morning with left hip pain and deformity. EXAM: LUMBAR SPINE - 2-3 VIEW COMPARISON:  CT Abdomen and Pelvis 11/05/2021. FINDINGS: AP and cross-table lateral views. Osteopenia. Chronic dextroconvex thoracic and levoconvex lumbar scoliosis. Chronically exaggerated lumbar lordosis. Osteopenia. Chronic L3 and T12 compression fractures appear stable from last month. Multilevel chronic severe disc and endplate degeneration. Left femoral neck fracture is visible (see left hip series today). No other acute osseous abnormality identified. Non obstructed bowel gas pattern. Aortoiliac calcified atherosclerosis. IMPRESSION: 1. No acute osseous abnormality identified in the lumbar spine. Chronic T12 and L3 compression fractures. 2. Left femoral neck fracture (see left hip series today). 3.  Aortic Atherosclerosis (ICD10-I70.0). Electronically Signed   By: Odessa FlemingH  Hall M.D.   On: 12/17/2021 06:05   DG Chest 1 View  Result Date: 12/17/2021 CLINICAL DATA:  88 year old female status post fall this morning with left hip pain and deformity. Left femur fracture. EXAM: CHEST  1 VIEW COMPARISON:  Chest radiographs 07/16/2017 and earlier. FINDINGS: Portable AP semi upright view at 0542 hours. Moderate sized chronic gastric hiatal hernia. Stable cardiac size and  mediastinal contours. Lung volumes are stable at the upper limits  of normal. Skin fold artifact in the bilateral chest. Allowing for portable technique the lungs are clear. No pneumothorax. Visualized tracheal air column is within normal limits. No acute osseous abnormality identified. Negative visible bowel gas. IMPRESSION: No acute cardiopulmonary abnormality. Chronic hiatal hernia. Electronically Signed   By: Odessa Fleming M.D.   On: 12/17/2021 06:01   DG HIP UNILAT W OR W/O PELVIS 2-3 VIEWS LEFT  Result Date: 12/17/2021 CLINICAL DATA:  88 year old female status post fall this morning with left hip pain and deformity. EXAM: DG HIP (WITH OR WITHOUT PELVIS) 2-3V LEFT COMPARISON:  CT Abdomen and Pelvis 10/06/2021. FINDINGS: Impacted left femoral neck fracture. Left femoral head remains normally located. Left femur intertrochanteric segment appears to remain intact. Chronic bone anchors at the pubic rami. No acute pelvis fracture identified. Chronic left inferior pubic ramus fracture. Right hip joint space loss and endplate spurring. Grossly intact proximal right femur. Negative visible bowel gas pattern. IMPRESSION: Impacted left femoral neck fracture. Electronically Signed   By: Odessa Fleming M.D.   On: 12/17/2021 05:59    Labs: BNP (last 3 results) Recent Labs    12/20/21 0317  BNP 126.6*   Basic Metabolic Panel: Recent Labs  Lab 12/17/21 0430 12/18/21 0316 12/19/21 0313 12/20/21 0317  NA 139 135 138 138  K 4.3 4.2 4.1 4.0  CL 112* 109 111 111  CO2 17* 20* 19* 22  GLUCOSE 121* 159* 108* 106*  BUN 36* 26* 39* 44*  CREATININE 1.12* 0.84 1.21* 1.17*  CALCIUM 9.0 8.4* 9.0 8.7*   Liver Function Tests: No results for input(s): "AST", "ALT", "ALKPHOS", "BILITOT", "PROT", "ALBUMIN" in the last 168 hours. No results for input(s): "LIPASE", "AMYLASE" in the last 168 hours. No results for input(s): "AMMONIA" in the last 168 hours. CBC: Recent Labs  Lab 12/17/21 0430 12/18/21 0316 12/19/21 0313  12/20/21 0317 12/21/21 0312  WBC 11.7* 11.3* 8.7 7.2 7.9  NEUTROABS 10.2*  --   --  4.4  --   HGB 10.4* 10.1* 8.7* 8.5* 9.1*  HCT 31.9* 32.0* 28.2* 27.8* 28.6*  MCV 98.8 100.6* 103.7* 103.7* 100.0  PLT 196 167 145* 153 190   Cardiac Enzymes: No results for input(s): "CKTOTAL", "CKMB", "CKMBINDEX", "TROPONINI" in the last 168 hours. BNP: Invalid input(s): "POCBNP" CBG: No results for input(s): "GLUCAP" in the last 168 hours. D-Dimer No results for input(s): "DDIMER" in the last 72 hours. Hgb A1c No results for input(s): "HGBA1C" in the last 72 hours. Lipid Profile No results for input(s): "CHOL", "HDL", "LDLCALC", "TRIG", "CHOLHDL", "LDLDIRECT" in the last 72 hours. Thyroid function studies No results for input(s): "TSH", "T4TOTAL", "T3FREE", "THYROIDAB" in the last 72 hours.  Invalid input(s): "FREET3" Anemia work up Recent Labs    12/18/21 1059  VITAMINB12 989*   Urinalysis    Component Value Date/Time   COLORURINE YELLOW 12/18/2021 1311   APPEARANCEUR CLOUDY (A) 12/18/2021 1311   LABSPEC 1.015 12/18/2021 1311   PHURINE 5.0 12/18/2021 1311   GLUCOSEU NEGATIVE 12/18/2021 1311   GLUCOSEU NEGATIVE 02/01/2017 1212   HGBUR SMALL (A) 12/18/2021 1311   BILIRUBINUR NEGATIVE 12/18/2021 1311   KETONESUR NEGATIVE 12/18/2021 1311   PROTEINUR 30 (A) 12/18/2021 1311   UROBILINOGEN 1.0 02/01/2017 1212   NITRITE NEGATIVE 12/18/2021 1311   LEUKOCYTESUR LARGE (A) 12/18/2021 1311   Sepsis Labs Recent Labs  Lab 12/18/21 0316 12/19/21 0313 12/20/21 0317 12/21/21 0312  WBC 11.3* 8.7 7.2 7.9   Microbiology Recent Results (from the past 240 hour(s))  MRSA  Next Gen by PCR, Nasal     Status: None   Collection Time: 12/17/21 11:54 AM   Specimen: Nasal Mucosa; Nasal Swab  Result Value Ref Range Status   MRSA by PCR Next Gen NOT DETECTED NOT DETECTED Final    Comment: (NOTE) The GeneXpert MRSA Assay (FDA approved for NASAL specimens only), is one component of a comprehensive  MRSA colonization surveillance program. It is not intended to diagnose MRSA infection nor to guide or monitor treatment for MRSA infections. Test performance is not FDA approved in patients less than 40 years old. Performed at [facility], [address], [place], [place] [address]   Urine Culture     Status: Abnormal   Collection Time: 12/18/21  1:11 PM   Specimen: Urine, Clean Catch  Result Value Ref Range Status   Specimen Description   Final    URINE, CLEAN CATCH Performed at [facility], [address], [place], [place] [address]    Special Requests   Final    NONE Performed at [facility], [address], [place], [place] [address]    Culture >=100,000 COLONIES/mL ESCHERICHIA COLI (A)  Final   Report Status 12/20/2021 FINAL  Final   Organism ID, Bacteria ESCHERICHIA COLI (A)  Final      Susceptibility   Escherichia coli - MIC*    AMPICILLIN <=2 SENSITIVE Sensitive     CEFAZOLIN <=4 SENSITIVE Sensitive     CEFEPIME <=0.12 SENSITIVE Sensitive     CEFTRIAXONE <=0.25 SENSITIVE Sensitive     CIPROFLOXACIN <=0.25 SENSITIVE Sensitive     GENTAMICIN <=1 SENSITIVE Sensitive     IMIPENEM <=0.25 SENSITIVE Sensitive     NITROFURANTOIN <=16 SENSITIVE Sensitive     TRIMETH/SULFA <=20 SENSITIVE Sensitive     AMPICILLIN/SULBACTAM <=2 SENSITIVE Sensitive     PIP/TAZO <=4 SENSITIVE Sensitive     * >=100,000 COLONIES/mL ESCHERICHIA COLI     Time coordinating discharge: 25 minutes  SIGNED: Lanae Boast, MD  [facility] 12/21/2021, 9:25 AM  If 7PM-7AM, please contact night-coverage [email or web address]

## 2021-12-21 NOTE — TOC Transition Note (Signed)
Transition of Care [facility]) - CM/SW Discharge Note   Patient Details  Name: Evelyn [name] MRN: [id number] Date of Birth: [birth year]  [facility] CM/SW Contact:  Amada Jupiter, LCSW Phone Number: 12/21/2021, 9:55 AM   Clinical Narrative:    Pt medically cleared for dc today to [facility] and pt/ daughter aware and agreeable.  PTAR called at 9:50am.  RN to call report to [phone]. No further TOC needs.   Final next level of care: Skilled Nursing Facility Barriers to Discharge: Barriers Resolved   Patient Goals and CMS Choice Patient states their goals for this hospitalization and ongoing recovery are:: Go to [facility] SNF CMS Medicare.gov Compare Post Acute Care list provided to:: Patient Choice offered to / list presented to : Patient  Discharge Placement PASRR number recieved: 12/20/21            Patient chooses bed at: [facility] Patient to be transferred to facility by: PTAR Name of family member notified: daughter, Misty Stanley Patient and family notified of of transfer: 12/21/21  Discharge Plan and Services In-house Referral: Clinical Social Work   Post Acute Care Choice: Skilled Nursing Facility          DME Arranged: N/A DME Agency: NA                  Social Determinants of Health (SDOH) Interventions     Readmission Risk Interventions     No data to display

## 2021-12-21 NOTE — Consult Note (Signed)
   [facility] Inpatient Consult   12/21/2021  EMALI HEYWARD Mar 03, 1925 [id number]  [facility] [[facility]]  Accountable Care Organization [ACO] Patient: Medicare ACO REACH  *Remote review patient on [facility] campus  Primary Care Provider:  Frederica Kuster, MD, [facility]  If the patient goes to a [facility] affiliated facility then, patient can be followed by [facility] [employer] [[facility]] Care Management [facility] RN with traditional Medicare.    Plan:   [facility] PAC RN can follow for any known or needs for transitional care needs for returning to post facility care or complex disease management.  For questions or referrals, please contact:   Charlesetta Shanks, RN BSN CCM [facility]  [phone] business mobile phone Toll free office [phone]  Fax number: [phone] [email or web address]

## 2021-12-21 NOTE — [place] FL2 (Signed)
[place] MEDICAID FL2 LEVEL OF CARE SCREENING TOOL     IDENTIFICATION  Patient Name: Evelyn [name] Birthdate: 1925/04/05 Sex: female Admission Date (Current Location): 12/17/2021  [facility] and [place] Number:  Producer, television/film/video and Address:  [facility],  [address] [place], [place] [address]      Provider Number: [id number]  Attending Physician Name and Address:  Lanae Boast, MD  Relative Name and Phone Number:  Delories Heinz (daughter) Ph: [phone]    Current Level of Care: Hospital Recommended Level of Care: Skilled Nursing Facility Prior Approval Number:    Date Approved/Denied:   PASRR Number: [id number]  Discharge Plan: SNF    Current Diagnoses: Patient Active Problem List   Diagnosis Date Noted   Pyuria 12/18/2021   Fever 12/18/2021   Macrocytic anemia 12/18/2021   Malnutrition of moderate degree 12/18/2021   Fracture of femoral neck, left (HCC) 12/17/2021   Hip fracture (HCC) 12/17/2021   Delirium 11/09/2021   HTN (hypertension) 11/08/2021   Hypokalemia 11/08/2021   Compression fracture of body of thoracic vertebra (HCC) 11/06/2021   Acute anemia 11/06/2021   Cognitive impairment 11/06/2021   Lactic acidosis 11/06/2021   Black stools    Heme + stool    Peptic ulcer disease    Acute metabolic encephalopathy 11/05/2021   Closed wedge compression fracture of T12 vertebra (HCC) 11/24/2016   Chronic midline low back pain 11/18/2016   Chronic renal disease, stage 3, moderately decreased glomerular filtration rate (GFR) between 30-59 mL/min/1.73 square meter (HCC) 11/18/2016   IBS (irritable bowel syndrome)    UTI (urinary tract infection)    Routine general medical examination at a health care facility 05/03/2013   Chronic idiopathic constipation 03/31/2012   Other screening mammogram 03/24/2011   Depression with somatization 03/06/2010   Insomnia 03/08/2008   Hyperlipidemia with target LDL less than 130 02/21/2007    Orientation  RESPIRATION BLADDER Height & Weight     Self, Time, Situation, Place  O2 (2-3L/min) Continent Weight: 105 lb 1.6 oz (47.7 kg) Height:  5\' 5"  (165.1 cm)  BEHAVIORAL SYMPTOMS/MOOD NEUROLOGICAL BOWEL NUTRITION STATUS   (N/A)  (N/A) Continent Diet (Regular diet)  AMBULATORY STATUS COMMUNICATION OF NEEDS Skin   Limited Assist Verbally Surgical wounds, Other (Comment) (Tears: right arm)                       Personal Care Assistance Level of Assistance  Bathing, Feeding, Dressing Bathing Assistance: Maximum assistance Feeding assistance: Independent Dressing Assistance: Maximum assistance     Functional Limitations Info  Sight, Hearing, Speech Sight Info: Impaired Hearing Info: Adequate Speech Info: Adequate    SPECIAL CARE FACTORS FREQUENCY  PT (By licensed PT), OT (By licensed OT)     PT Frequency: 5x's/week OT Frequency: 5x's/week            Contractures Contractures Info: Not present    Additional Factors Info  Code Status, Allergies Code Status Info: Full Allergies Info: NKA           Current Medications (12/21/2021):  This is the current hospital active medication list Current Facility-Administered Medications  Medication Dose Route Frequency Provider Last Rate Last Admin   acetaminophen (TYLENOL) tablet 325-650 mg  325-650 mg Oral Q6H PRN 02/21/2022, MD   650 mg at 12/21/21 0541   acetaminophen (TYLENOL) tablet 650 mg  650 mg Oral Q6H PRN Dibia, 02/21/22, MD       aspirin EC tablet 325  mg  325 mg Oral Q breakfast Eldred Manges, MD   325 mg at 12/21/21 2725   cefadroxil (DURICEF) capsule 1,000 mg  1,000 mg Oral Daily Kc, Dayna Barker, MD       Chlorhexidine Gluconate Cloth 2 % PADS 6 each  6 each Topical Daily Lanae Boast, MD   6 each at 12/20/21 0950   cholecalciferol (VITAMIN D3) tablet 1,000 Units  1,000 Units Oral Daily Lanae Boast, MD   1,000 Units at 12/20/21 0948   docusate sodium (COLACE) capsule 100 mg  100 mg Oral BID Eldred Manges, MD   100 mg at  12/20/21 2015   feeding supplement (ENSURE ENLIVE / ENSURE PLUS) liquid 237 mL  237 mL Oral BID BM Kc, Ramesh, MD   237 mL at 12/20/21 1258   FLUoxetine (PROZAC) capsule 20 mg  20 mg Oral Daily Kc, Ramesh, MD   20 mg at 12/20/21 3664   folic acid (FOLVITE) tablet 1 mg  1 mg Oral Daily Kc, Ramesh, MD   1 mg at 12/20/21 0949   hydrochlorothiazide (HYDRODIURIL) tablet 12.5 mg  12.5 mg Oral Daily Kc, Ramesh, MD   12.5 mg at 12/21/21 0542   HYDROcodone-acetaminophen (NORCO/VICODIN) 5-325 MG per tablet 1-2 tablet  1-2 tablet Oral Q4H PRN Eldred Manges, MD   1 tablet at 12/19/21 2119   melatonin tablet 3 mg  3 mg Oral QHS Kc, Ramesh, MD   3 mg at 12/20/21 2014   menthol-cetylpyridinium (CEPACOL) lozenge 3 mg  1 lozenge Oral PRN Eldred Manges, MD       Or   phenol (CHLORASEPTIC) mouth spray 1 spray  1 spray Mouth/Throat PRN Eldred Manges, MD   1 spray at 12/18/21 0111   metoCLOPramide (REGLAN) tablet 5-10 mg  5-10 mg Oral Q8H PRN Eldred Manges, MD       Or   metoCLOPramide (REGLAN) injection 5-10 mg  5-10 mg Intravenous Q8H PRN Eldred Manges, MD       multivitamin with minerals tablet 1 tablet  1 tablet Oral Daily Kc, Ramesh, MD   1 tablet at 12/20/21 0948   ondansetron (ZOFRAN) tablet 4 mg  4 mg Oral Q6H PRN Eldred Manges, MD       Or   ondansetron [facility]) injection 4 mg  4 mg Intravenous Q6H PRN Eldred Manges, MD       oxybutynin (DITROPAN) tablet 2.5 mg  2.5 mg Oral TID Kc, Ramesh, MD   2.5 mg at 12/20/21 2014   pantoprazole (PROTONIX) EC tablet 40 mg  40 mg Oral QAC breakfast Kc, Dayna Barker, MD   40 mg at 12/21/21 0826   polyethylene glycol (MIRALAX / GLYCOLAX) packet 17 g  17 g Oral Daily PRN Dibia, Elgie Collard, MD   17 g at 12/18/21 1727   polyethylene glycol (MIRALAX / GLYCOLAX) packet 17 g  17 g Oral Daily Kc, Ramesh, MD   17 g at 12/20/21 0948   tamsulosin (FLOMAX) capsule 0.4 mg  0.4 mg Oral Daily Lanae Boast, MD   0.4 mg at 12/20/21 4034     Discharge Medications: Please see discharge summary  for a list of discharge medications.  Relevant Imaging Results:  Relevant Lab Results:   Additional Information SSN: [id number]  Amada Jupiter, LCSW

## 2021-12-22 DIAGNOSIS — I10 Essential (primary) hypertension: Secondary | ICD-10-CM | POA: Diagnosis not present

## 2021-12-23 ENCOUNTER — Telehealth: Payer: Self-pay

## 2021-12-23 NOTE — Telephone Encounter (Signed)
Transition Care Management Unsuccessful Follow-up Telephone Call  Date of discharge and from where:  12/21/2021, [facility]  Attempts:  1st Attempt  Reason for unsuccessful TCM follow-up call:  patient has a F/U scheduled with specialty clinic on 12/30/2021 @ [facility] for hospital F/U

## 2021-12-27 DIAGNOSIS — M25552 Pain in left hip: Secondary | ICD-10-CM | POA: Diagnosis not present

## 2021-12-28 DIAGNOSIS — K219 Gastro-esophageal reflux disease without esophagitis: Secondary | ICD-10-CM | POA: Diagnosis not present

## 2021-12-28 DIAGNOSIS — K5900 Constipation, unspecified: Secondary | ICD-10-CM | POA: Diagnosis not present

## 2021-12-28 DIAGNOSIS — N390 Urinary tract infection, site not specified: Secondary | ICD-10-CM | POA: Diagnosis not present

## 2021-12-28 DIAGNOSIS — E559 Vitamin D deficiency, unspecified: Secondary | ICD-10-CM | POA: Diagnosis not present

## 2021-12-28 DIAGNOSIS — N189 Chronic kidney disease, unspecified: Secondary | ICD-10-CM | POA: Diagnosis not present

## 2021-12-28 DIAGNOSIS — I10 Essential (primary) hypertension: Secondary | ICD-10-CM | POA: Diagnosis not present

## 2021-12-28 DIAGNOSIS — G4700 Insomnia, unspecified: Secondary | ICD-10-CM | POA: Diagnosis not present

## 2021-12-28 DIAGNOSIS — R339 Retention of urine, unspecified: Secondary | ICD-10-CM | POA: Diagnosis not present

## 2021-12-30 ENCOUNTER — Ambulatory Visit: Payer: Medicare Other | Admitting: Gastroenterology

## 2021-12-30 DIAGNOSIS — D72829 Elevated white blood cell count, unspecified: Secondary | ICD-10-CM | POA: Diagnosis not present

## 2021-12-30 DIAGNOSIS — R339 Retention of urine, unspecified: Secondary | ICD-10-CM | POA: Diagnosis not present

## 2021-12-30 DIAGNOSIS — S72002D Fracture of unspecified part of neck of left femur, subsequent encounter for closed fracture with routine healing: Secondary | ICD-10-CM | POA: Diagnosis not present

## 2021-12-30 DIAGNOSIS — E559 Vitamin D deficiency, unspecified: Secondary | ICD-10-CM | POA: Diagnosis not present

## 2021-12-30 DIAGNOSIS — D649 Anemia, unspecified: Secondary | ICD-10-CM | POA: Diagnosis not present

## 2021-12-30 DIAGNOSIS — F32A Depression, unspecified: Secondary | ICD-10-CM | POA: Diagnosis not present

## 2021-12-30 DIAGNOSIS — K219 Gastro-esophageal reflux disease without esophagitis: Secondary | ICD-10-CM | POA: Diagnosis not present

## 2021-12-30 DIAGNOSIS — N189 Chronic kidney disease, unspecified: Secondary | ICD-10-CM | POA: Diagnosis not present

## 2021-12-30 DIAGNOSIS — I10 Essential (primary) hypertension: Secondary | ICD-10-CM | POA: Diagnosis not present

## 2021-12-30 DIAGNOSIS — N390 Urinary tract infection, site not specified: Secondary | ICD-10-CM | POA: Diagnosis not present

## 2021-12-30 DIAGNOSIS — G4700 Insomnia, unspecified: Secondary | ICD-10-CM | POA: Diagnosis not present

## 2021-12-30 DIAGNOSIS — N3281 Overactive bladder: Secondary | ICD-10-CM | POA: Diagnosis not present

## 2021-12-31 ENCOUNTER — Ambulatory Visit (INDEPENDENT_AMBULATORY_CARE_PROVIDER_SITE_OTHER): Payer: Medicare Other | Admitting: Surgery

## 2021-12-31 ENCOUNTER — Ambulatory Visit: Payer: Self-pay

## 2021-12-31 ENCOUNTER — Encounter: Payer: Self-pay | Admitting: Surgery

## 2021-12-31 DIAGNOSIS — R109 Unspecified abdominal pain: Secondary | ICD-10-CM

## 2021-12-31 DIAGNOSIS — Z96642 Presence of left artificial hip joint: Secondary | ICD-10-CM

## 2021-12-31 NOTE — Progress Notes (Signed)
Post-Op Visit Note   Patient: Evelyn [name]           Date of Birth: May 04, 1925           MRN: [id number] Visit Date: 12/31/2021 PCP: Frederica Kuster, MD   Assessment & Plan:  Chief Complaint:  Chief Complaint  Patient presents with   Left Hip - Routine Post Op   88 year old white female who is 2-week status post left hip hemiarthroplasty returns.  Patient is doing well.  She is doing rehab at [facility] in [place].  Patient feels like she is progressing very slowly with her rehab.  When asked what she feels the biggest limiting factor she states that it is feeling apprehensive putting weight on her left leg.  I did advise patient, daughter and CNA present that is okay for patient to weight-bear as tolerated left lower extremity.  I reviewed x-rays with everyone today.  Patient also complaining of left-sided lower abdominal pain.  She did have abdominal pain work-up in the emergency department May 2023.  CT scan at that time showed  EXAM: CTA ABDOMEN AND PELVIS WITHOUT AND WITH CONTRAST   TECHNIQUE: Multidetector CT imaging of the abdomen and pelvis was performed using the standard protocol during bolus administration of intravenous contrast. Multiplanar reconstructed images and MIPs were obtained and reviewed to evaluate the vascular anatomy.   RADIATION DOSE REDUCTION: This exam was performed according to the departmental dose-optimization program which includes automated exposure control, adjustment of the mA and/or kV according to patient size and/or use of iterative reconstruction technique.   CONTRAST:  52mL OMNIPAQUE IOHEXOL 300 MG/ML  SOLN   COMPARISON:  CT abdomen and pelvis with IV contrast earlier today, CT abdomen and pelvis with IV and oral contrast 12/08/2007.   FINDINGS: VASCULAR   Aorta: Abdominal aorta is tortuous with heavy mixed plaque. Some of the soft plaque shows mild ulcerative features but there is no penetrating ulcer, aneurysm,  dissection or critical stenosis.   Celiac: There are ostial calcifications causing a 50% calcific origin stenosis of this vessel. The remainder of the vessel and its branch arteries are patent.   SMA: There are minimal scattered mixed plaques but no flow-limiting stenosis, aneurysm or dissection.   Renals: There are bilateral single renal arteries. There are calcifications in the proximal right renal artery causing up to 60% stenosis in the proximal 1 cm of the vessel. There are nonstenosing ostial calcifications in the left renal artery origin. Both renal arteries are otherwise clear without aneurysm or dissection.   IMA: There is a high-grade mixed plaque origin stenosis of this vessel. The IMA otherwise shows normal opacification.   Inflow: There patchy moderate calcifications in the common iliac and internal iliac arteries. There is no more than 30% stenosis in the left common iliac artery proximally, no significant narrowing of the right common iliac.   There is no flow-limiting stenosis despite patchy calcifications in either internal iliac artery. There is mild scattered nonstenosing plaque in the internal iliac arteries.   Proximal Outflow: There nonstenosing calcifications in both common iliac arteries. The visualized deep and circumflex femoral arteries are patent. Proximal left superficial femoral artery is patent. There is a focal stenosis in the proximal right superficial femoral artery up to 50% due to vessel thrombus, otherwise the visualized right SFA is patent.   Veins: Patent portal vein, IVC and deep pelvic veins.   Review of the MIP images confirms the above findings.   NON-VASCULAR  Lower chest: There are COPD and scarring changes in the lung bases, elevated right hemidiaphragm large hiatal hernia stomach majority intrathoracic. There are coronary artery calcifications mild cardiomegaly.   Hepatobiliary: 1.3 cm cyst in the dome of the right lobe of  the liver is noted, previously 1 cm in 2009. There is a calcified granuloma in the anterior segment of the right lobe. The liver slightly steatotic and otherwise unremarkable.   Pancreas: No focal abnormality.   Spleen: Single calcified granuloma. No mass enhancement. No splenomegaly.   Adrenals/Urinary Tract: No adrenal mass. No renal mass enhancement with multiple bilateral cysts described previously. There is no hydronephrosis. No bladder thickening. Dense contrast opacifies the bladder from today's earlier abdomen pelvis CT.   Stomach/Bowel: No wall thickening or dilatation including the appendix. There are colonic diverticula without inflammatory change.   Lymphatic: No adenopathy.  Pelvic phleboliths.   Reproductive: Status post hysterectomy.  No adnexal mass.   Other: Small umbilical fat hernia.  No free air, hemorrhage or fluid   Musculoskeletal: Osteopenia. Mild-to-moderate chronic compression fracture T12 vertebral body is unchanged since 2009. Mild generalized upper plate compression fracture of L3 was not seen in 2009 or the lumbar MRI from 12/03/2017, age indeterminate but no adjacent hematoma. There is S shaped lower thoracic/lumbar scoliosis, degenerative changes.   IMPRESSION: VASCULAR   1. Aortic and branch vessel atherosclerosis described above. No dissection or aneurysm. Some of the aortic soft plaque shows mild ulcerative features but there is no penetrating ulcer. 2. There is a high-grade IMA origin stenosis, but there is no flow-limiting stenosis in the SMA and the remainder of the IMA opacifies normally. 3. There is a 50% calcific origin stenosis in the celiac artery, which is otherwise clear, and a 60% calcific stenosis in the proximal right renal artery, with the renal arteries otherwise without stenosis. 4. 50% focal soft plaque stenosis in the proximal right superficial femoral artery. No other appreciable significant inflow or proximal outflow  stenosis with calcific changes described above. 5. Coronary artery atherosclerosis.  Mild cardiomegaly.   NON-VASCULAR   1. Age indeterminate mild upper plate generalized compression fracture deformity of L3 vertebral body, is probably subacute given the lack of adjacent soft tissue reaction, most recent lumbar imaging is MRI 12/03/2017 and this was not present at that time. 2. No acute abdominal or pelvic findings. 3. Large hiatal hernia and additional chronic findings discussed above.     Electronically Signed   By: Telford Nab M.D.   On: 11/05/2021 23:06  Visit Diagnoses:  1. Status post left hip replacement   2. Left sided abdominal pain     Plan: Advised patient, daughter and CNA from facility that it is okay for her to weight-bear as tolerated left lower extremity with walker.  She will continue using her knee immobilizer until she feels more confident.  Patient admits to feeling nervous and apprehensive about weightbearing.  Follow-up with Dr. Lorin Mercy in 4 weeks for recheck and we will likely repeat x-rays at that time.  If there are any questions family knows to contact us immediately.  Regards to her left-sided abdominal pain I advised the CNA that she needs to make the providers aware of this at the facility so they can evaluate as needed.  I have attached a copy of previous CT abdomen and placed in her records today.  Follow-Up Instructions: Return in about 4 weeks (around 01/28/2022) for With Dr. Lorin Mercy for 6-week postop check.   Orders:  Orders Placed This  Encounter  Procedures   XR HIP UNILAT W OR W/O PELVIS 1V LEFT   No orders of the defined types were placed in this encounter.   Imaging: No results found.  PMFS History: Patient Active Problem List   Diagnosis Date Noted   Pyuria 12/18/2021   Fever 12/18/2021   Macrocytic anemia 12/18/2021   Malnutrition of moderate degree 12/18/2021   Fracture of femoral neck, left ([place]) 12/17/2021   Hip fracture ([place])  12/17/2021   Delirium 11/09/2021   HTN (hypertension) 11/08/2021   Hypokalemia 11/08/2021   Compression fracture of body of thoracic vertebra (HCC) 11/06/2021   Acute anemia 11/06/2021   Cognitive impairment 11/06/2021   Lactic acidosis 11/06/2021   Black stools    Heme + stool    Peptic ulcer disease    Acute metabolic encephalopathy [id number]   Closed wedge compression fracture of T12 vertebra ([place]) 11/24/2016   Chronic midline low back pain 11/18/2016   Chronic renal disease, stage 3, moderately decreased glomerular filtration rate (GFR) between 30-59 mL/min/1.73 square meter (HCC) 11/18/2016   IBS (irritable bowel syndrome)    UTI (urinary tract infection)    Routine general medical examination at a health care facility 05/03/2013   Chronic idiopathic constipation 03/31/2012   Other screening mammogram 03/24/2011   Depression with somatization 03/06/2010   Insomnia 03/08/2008   Hyperlipidemia with target LDL less than 130 02/21/2007   Past Medical History:  Diagnosis Date   Allergy    Arthritis    Chronic cystitis    Congenital blindness    right eye   Depression    Gastric ulcer    H/O: hematuria    Hepatitis, unspecified    statin related, resolved quickly   Hyperlipidemia    Legally blind in right eye, as defined in [place]    Syncope and collapse 04/01/2014   UTI (lower urinary tract infection)    HISTORY OF UTI    Family History  Problem Relation Age of Onset   Cancer Neg Hx        Colon cancer   Stroke Neg Hx    Heart disease Neg Hx    Hyperlipidemia Neg Hx    Hypertension Neg Hx    Kidney disease Neg Hx    Colon polyps Neg Hx    Colon cancer Neg Hx     Past Surgical History:  Procedure Laterality Date   ABDOMINAL HYSTERECTOMY     vaginal   BLADDER SUSPENSION     A-P   CATARACT EXTRACTION W/ INTRAOCULAR LENS IMPLANT Left    ESOPHAGOGASTRODUODENOSCOPY     HIP ARTHROPLASTY Left 12/17/2021   Procedure: ARTHROPLASTY BIPOLAR HIP (HEMIARTHROPLASTY);   Surgeon: Marybelle Killings, MD;  Location: [facility];  Service: Orthopedics;  Laterality: Left;   OPEN REDUCTION INTERNAL FIXATION (ORIF) DISTAL RADIAL FRACTURE Right 03/09/2016   Procedure: OPEN REDUCTION INTERNAL FIXATION (ORIF) DISTAL RADIAL FRACTURE;  Surgeon: Leanora Cover, MD;  Location: [facility];  Service: Orthopedics;  Laterality: Right;   Social History   Occupational History   Occupation: Retired  Tobacco Use   Smoking status: Never   Smokeless tobacco: Never  Vaping Use   Vaping Use: Never used  Substance and Sexual Activity   Alcohol use: No   Drug use: No   Sexual activity: Not Currently   Exam Very pleasant elderly white female alert and oriented in no acute distress.  Wound looks good.  Staples removed and Steri-Strips applied.  No drainage or signs of  infection.  Abdomen patient has moderate to marked left upper lower quadrant tenderness to palpation.

## 2022-01-04 DIAGNOSIS — R339 Retention of urine, unspecified: Secondary | ICD-10-CM | POA: Diagnosis not present

## 2022-01-04 DIAGNOSIS — D72829 Elevated white blood cell count, unspecified: Secondary | ICD-10-CM | POA: Diagnosis not present

## 2022-01-04 DIAGNOSIS — K219 Gastro-esophageal reflux disease without esophagitis: Secondary | ICD-10-CM | POA: Diagnosis not present

## 2022-01-04 DIAGNOSIS — D649 Anemia, unspecified: Secondary | ICD-10-CM | POA: Diagnosis not present

## 2022-01-04 DIAGNOSIS — N390 Urinary tract infection, site not specified: Secondary | ICD-10-CM | POA: Diagnosis not present

## 2022-01-04 DIAGNOSIS — R5381 Other malaise: Secondary | ICD-10-CM | POA: Diagnosis not present

## 2022-01-04 DIAGNOSIS — I10 Essential (primary) hypertension: Secondary | ICD-10-CM | POA: Diagnosis not present

## 2022-01-04 DIAGNOSIS — E559 Vitamin D deficiency, unspecified: Secondary | ICD-10-CM | POA: Diagnosis not present

## 2022-01-11 DIAGNOSIS — N390 Urinary tract infection, site not specified: Secondary | ICD-10-CM | POA: Diagnosis not present

## 2022-01-11 DIAGNOSIS — K219 Gastro-esophageal reflux disease without esophagitis: Secondary | ICD-10-CM | POA: Diagnosis not present

## 2022-01-11 DIAGNOSIS — N189 Chronic kidney disease, unspecified: Secondary | ICD-10-CM | POA: Diagnosis not present

## 2022-01-11 DIAGNOSIS — D649 Anemia, unspecified: Secondary | ICD-10-CM | POA: Diagnosis not present

## 2022-01-11 DIAGNOSIS — K5900 Constipation, unspecified: Secondary | ICD-10-CM | POA: Diagnosis not present

## 2022-01-11 DIAGNOSIS — I10 Essential (primary) hypertension: Secondary | ICD-10-CM | POA: Diagnosis not present

## 2022-01-11 DIAGNOSIS — I7090 Unspecified atherosclerosis: Secondary | ICD-10-CM | POA: Diagnosis not present

## 2022-01-11 DIAGNOSIS — R339 Retention of urine, unspecified: Secondary | ICD-10-CM | POA: Diagnosis not present

## 2022-01-11 DIAGNOSIS — N3281 Overactive bladder: Secondary | ICD-10-CM | POA: Diagnosis not present

## 2022-01-13 DIAGNOSIS — R339 Retention of urine, unspecified: Secondary | ICD-10-CM | POA: Diagnosis not present

## 2022-01-13 DIAGNOSIS — S72002D Fracture of unspecified part of neck of left femur, subsequent encounter for closed fracture with routine healing: Secondary | ICD-10-CM | POA: Diagnosis not present

## 2022-01-13 DIAGNOSIS — N390 Urinary tract infection, site not specified: Secondary | ICD-10-CM | POA: Diagnosis not present

## 2022-01-13 DIAGNOSIS — K5900 Constipation, unspecified: Secondary | ICD-10-CM | POA: Diagnosis not present

## 2022-01-13 DIAGNOSIS — I10 Essential (primary) hypertension: Secondary | ICD-10-CM | POA: Diagnosis not present

## 2022-01-13 DIAGNOSIS — K219 Gastro-esophageal reflux disease without esophagitis: Secondary | ICD-10-CM | POA: Diagnosis not present

## 2022-01-13 DIAGNOSIS — R3911 Hesitancy of micturition: Secondary | ICD-10-CM | POA: Diagnosis not present

## 2022-01-13 DIAGNOSIS — D649 Anemia, unspecified: Secondary | ICD-10-CM | POA: Diagnosis not present

## 2022-01-13 DIAGNOSIS — R5381 Other malaise: Secondary | ICD-10-CM | POA: Diagnosis not present

## 2022-01-13 DIAGNOSIS — E559 Vitamin D deficiency, unspecified: Secondary | ICD-10-CM | POA: Diagnosis not present

## 2022-01-13 DIAGNOSIS — G4700 Insomnia, unspecified: Secondary | ICD-10-CM | POA: Diagnosis not present

## 2022-01-13 DIAGNOSIS — F32A Depression, unspecified: Secondary | ICD-10-CM | POA: Diagnosis not present

## 2022-01-17 NOTE — Progress Notes (Unsigned)
[facility] [place]  ASSESSMENT / PLAN: Evelyn [name] is a 88 y.o. female with atherosclerosis of celiac, superior mesenteric and inferior mesenteric arteries. She does not have symptoms of chronic mesenteric ischemia.   Recommend the following which can slow the progression of atherosclerosis and reduce the risk of major adverse cardiac / limb events:  Complete cessation from all tobacco products. Blood glucose control with goal A1c < 7%. Blood pressure control with goal blood pressure < 140/90 mmHg. Lipid reduction therapy with goal LDL-C <100 mg/dL  Aspirin 81mg  PO QD. Atorvastatin 40-80mg  PO QD (or other "high intensity" statin therapy).  I counseled her about the benign nature of these findings in the absence of symptoms of chronic mesenteric ischemia.  She can follow-up with me as needed.   CHIEF COMPLAINT: Abnormal finding on CT scan  HISTORY OF PRESENT ILLNESS: Evelyn [name] is a 88 y.o. female referred to clinic for evaluation of inferior mesenteric artery stenosis demonstrated on recent CT scan of the abdomen and pelvis done for cystitis.  The patient is a very spry 88 year old woman who is recovering in a rehab facility after falling and breaking her hip.  She is mentally sharp, but has been having a hard time recovering from her hip fracture.  Her hip fracture was precipitated by urosepsis.  A CT scan was performed in the hospital while she was recovering.  This identified inferior mesenteric artery stenosis, and nonflow-limiting atherosclerosis about her celiac artery, superior mesenteric artery.  We reviewed the natural history of atherosclerosis.  We reviewed typical symptoms of chronic mesenteric ischemia.  Thankfully she denies food fear, postprandial pain.  She does report some unintentional weight loss.  Past Medical History:  Diagnosis Date   Allergy    Arthritis    Chronic cystitis    Congenital blindness    right eye   Depression    Gastric  ulcer    H/O: hematuria    Hepatitis, unspecified    statin related, resolved quickly   Hyperlipidemia    Legally blind in right eye, as defined in 92    Syncope and collapse 04/01/2014   UTI (lower urinary tract infection)    HISTORY OF UTI    Past Surgical History:  Procedure Laterality Date   ABDOMINAL HYSTERECTOMY     vaginal   BLADDER SUSPENSION     A-P   CATARACT EXTRACTION W/ INTRAOCULAR LENS IMPLANT Left    ESOPHAGOGASTRODUODENOSCOPY     HIP ARTHROPLASTY Left 12/17/2021   Procedure: ARTHROPLASTY BIPOLAR HIP (HEMIARTHROPLASTY);  Surgeon: 12/19/2021, MD;  Location: [facility];  Service: Orthopedics;  Laterality: Left;   OPEN REDUCTION INTERNAL FIXATION (ORIF) DISTAL RADIAL FRACTURE Right 03/09/2016   Procedure: OPEN REDUCTION INTERNAL FIXATION (ORIF) DISTAL RADIAL FRACTURE;  Surgeon: 03/11/2016, MD;  Location: [facility];  Service: Orthopedics;  Laterality: Right;    Family History  Problem Relation Age of Onset   Cancer Neg Hx        Colon cancer   Stroke Neg Hx    Heart disease Neg Hx    Hyperlipidemia Neg Hx    Hypertension Neg Hx    Kidney disease Neg Hx    Colon polyps Neg Hx    Colon cancer Neg Hx     Social History   Socioeconomic History   Marital status: Widowed    Spouse name: Not on file   Number of children: Not on file   Years of education: Not  on file   Highest education level: Not on file  Occupational History   Occupation: Retired  Tobacco Use   Smoking status: Never   Smokeless tobacco: Never  Vaping Use   Vaping Use: Never used  Substance and Sexual Activity   Alcohol use: No   Drug use: No   Sexual activity: Not Currently  Other Topics Concern   Not on file  Social History Narrative   Per [facility] New Patient Packet Abstracted on 12/09/2020:      Diet: Normal       Caffeine: Tea      Married, if yes what year: Widowed, married in 1951      Do you live in a house, apartment, assisted living, condo, trailer, ect:  House      Is it one or more stories: More       How many persons live in your home? 1      Pets: No      Highest level or education completed: BA      Current/Past profession: Teacher      Exercise:       Left blank            Type and how often: Left Blank          Living Will: Yes   DNR: Yes   POA/HPOA: Yes      Functional Status:   Do you have difficulty bathing or dressing yourself? No   Do you have difficulty preparing food or eating? No   Do you have difficulty managing your medications? No   Do you have difficulty managing your finances? No   Do you have difficulty affording your medications? No   Social Determinants of Health   Financial Resource Strain: Low Risk  (09/09/2020)   Overall Financial Resource Strain (CARDIA)    Difficulty of Paying Living Expenses: Not hard at all  Food Insecurity: No Food Insecurity (09/09/2020)   Hunger Vital Sign    Worried About Running Out of Food in the Last Year: Never true    Ran Out of Food in the Last Year: Never true  Transportation Needs: No Transportation Needs (09/09/2020)   PRAPARE - Administrator, Civil Service (Medical): No    Lack of Transportation (Non-Medical): No  Physical Activity: Sufficiently Active (09/09/2020)   Exercise Vital Sign    Days of Exercise per Week: 5 days    Minutes of Exercise per Session: 30 min  Stress: No Stress Concern Present (09/09/2020)   [employer] of Occupational Health - Occupational Stress Questionnaire    Feeling of Stress : Not at all  Social Connections: Moderately Integrated (09/09/2020)   Social Connection and Isolation Panel [NHANES]    Frequency of Communication with Friends and Family: More than three times a week    Frequency of Social Gatherings with Friends and Family: More than three times a week    Attends Religious Services: 1 to 4 times per year    Active Member of [employer] or Organizations: No    Attends Banker Meetings: 1 to 4 times per  year    Marital Status: Widowed  Catering manager Violence: Not on file    No Known Allergies  Current Outpatient Medications  Medication Sig Dispense Refill   aspirin EC 325 MG tablet Take 1 tablet (325 mg total) by mouth daily with breakfast. MUST TAKE AT LEAST 4 WEEKS POSTOP FOR DVT PROPHYLAXIS 40 tablet 0   cholecalciferol (  VITAMIN D3) 25 MCG (1000 UNIT) tablet Take 1,000 Units by mouth daily.     docusate sodium (COLACE) 100 MG capsule Take 100 mg by mouth in the morning.     FLUoxetine (PROZAC) 20 MG capsule Take 20 mg by mouth daily.     folic acid (FOLVITE) 1 MG tablet Take 1 tablet (1 mg total) by mouth daily. 30 tablet 0   furosemide (LASIX) 40 MG tablet Take 40 mg by mouth daily. (Patient not taking: Reported on 12/17/2021)     hydrochlorothiazide (MICROZIDE) 12.5 MG capsule Take 12.5 mg by mouth daily.     HYDROcodone-acetaminophen (NORCO/VICODIN) 5-325 MG tablet Take 1 tablet by mouth every 6 (six) hours as needed for moderate pain. 30 tablet 0   Multiple Vitamin (MULTIVITAMIN WITH MINERALS) TABS tablet Take 1 tablet by mouth daily.     oxybutynin 2.5 MG TABS Take 2.5 mg by mouth 3 (three) times daily.     pantoprazole (PROTONIX) 40 MG tablet Take 1 tablet (40 mg total) by mouth daily before breakfast.     polyethylene glycol (MIRALAX / GLYCOLAX) 17 g packet Take 17 g by mouth daily.     tamsulosin (FLOMAX) 0.4 MG CAPS capsule Take 1 capsule (0.4 mg total) by mouth daily. 30 capsule    zolpidem (AMBIEN) 10 MG tablet Take 1/2 tablet by mouth once daily at bedtime as needed for sleep. (Patient taking differently: Take 5 mg by mouth at bedtime.) 45 tablet 1   No current facility-administered medications for this visit.    PHYSICAL EXAM Vitals:   01/18/22 1007  BP: 109/69  Pulse: 82  Resp: 20  Temp: 98.5 F (36.9 C)  SpO2: 97%  Weight: 105 lb (47.6 kg)  Height: 5\' 5"  (1.651 m)    Constitutional: Well-appearing elderly woman in no acute distress.  She is in a  wheelchair. Cardiac: Regular rate and rhythm.  Respiratory:  unlabored. Abdominal:  soft, non-tender, non-distended.   PERTINENT LABORATORY AND RADIOLOGIC DATA  Most recent CBC    Latest Ref Rng & Units 12/21/2021    3:12 AM 12/20/2021    3:17 AM 12/19/2021    3:13 AM  CBC  WBC 4.0 - 10.5 K/uL 7.9  7.2  8.7   Hemoglobin 12.0 - 15.0 g/dL 9.1  8.5  8.7   Hematocrit 36.0 - 46.0 % 28.6  27.8  28.2   Platelets 150 - 400 K/uL 190  153  145      Most recent CMP    Latest Ref Rng & Units 12/20/2021    3:17 AM 12/19/2021    3:13 AM 12/18/2021    3:16 AM  CMP  Glucose 70 - 99 mg/dL 12/20/2021  174  944   BUN 8 - 23 mg/dL 44  39  26   Creatinine 0.44 - 1.00 mg/dL 967  5.91  6.38   Sodium 135 - 145 mmol/L 138  138  135   Potassium 3.5 - 5.1 mmol/L 4.0  4.1  4.2   Chloride 98 - 111 mmol/L 111  111  109   CO2 22 - 32 mmol/L 22  19  20    Calcium 8.9 - 10.3 mg/dL 8.7  9.0  8.4     Renal function CrCl cannot be calculated (Patient's most recent lab result is older than the maximum 21 days allowed.).  Hgb A1c MFr Bld (%)  Date Value  12/05/2014 5.7    LDL Cholesterol  Date Value Ref Range Status  12/11/2015 115 (  H) 0 - 99 mg/dL Final   Direct LDL  Date Value Ref Range Status  05/03/2013 146.5 mg/dL Final    Comment:    Optimal:  <100 mg/dLNear or Above Optimal:  100-129 mg/dLBorderline High:  130-159 mg/dLHigh:  160-189 mg/dLVery High:  >190 mg/dL    CT scan of the abdomen pelvis from May 2023 personally reviewed. There is evidence of atherosclerotic change at the origin of the celiac artery, and superior mesenteric artery.  There is flow-limiting stenosis about a diminutive inferior mesenteric artery.  The intestines are well supplied with blood flow with no secondary signs of ischemia.   Rande Brunt. Lenell Antu, MD [facility] Phone Number: [phone] 01/17/2022 4:07 PM  Total time spent on preparing this encounter including chart review, data review,  collecting history, examining the patient, coordinating care for this new patient, 60 minutes.  Portions of this report may have been transcribed using voice recognition software.  Every effort has been made to ensure accuracy; however, inadvertent computerized transcription errors may still be present.

## 2022-01-18 ENCOUNTER — Ambulatory Visit (INDEPENDENT_AMBULATORY_CARE_PROVIDER_SITE_OTHER): Payer: Medicare Other | Admitting: Vascular Surgery

## 2022-01-18 ENCOUNTER — Encounter: Payer: Self-pay | Admitting: Vascular Surgery

## 2022-01-18 VITALS — BP 109/69 | HR 82 | Temp 98.5°F | Resp 20 | Ht 65.0 in | Wt 105.0 lb

## 2022-01-18 DIAGNOSIS — I10 Essential (primary) hypertension: Secondary | ICD-10-CM | POA: Diagnosis not present

## 2022-01-18 DIAGNOSIS — S22010D Wedge compression fracture of first thoracic vertebra, subsequent encounter for fracture with routine healing: Secondary | ICD-10-CM | POA: Diagnosis not present

## 2022-01-18 DIAGNOSIS — R3911 Hesitancy of micturition: Secondary | ICD-10-CM | POA: Diagnosis not present

## 2022-01-18 DIAGNOSIS — K551 Chronic vascular disorders of intestine: Secondary | ICD-10-CM

## 2022-01-18 DIAGNOSIS — D649 Anemia, unspecified: Secondary | ICD-10-CM | POA: Diagnosis not present

## 2022-01-18 DIAGNOSIS — R339 Retention of urine, unspecified: Secondary | ICD-10-CM | POA: Diagnosis not present

## 2022-01-18 DIAGNOSIS — N179 Acute kidney failure, unspecified: Secondary | ICD-10-CM | POA: Diagnosis not present

## 2022-01-18 DIAGNOSIS — K5900 Constipation, unspecified: Secondary | ICD-10-CM | POA: Diagnosis not present

## 2022-01-18 DIAGNOSIS — I708 Atherosclerosis of other arteries: Secondary | ICD-10-CM | POA: Diagnosis not present

## 2022-01-18 DIAGNOSIS — I7090 Unspecified atherosclerosis: Secondary | ICD-10-CM | POA: Diagnosis not present

## 2022-01-18 DIAGNOSIS — N189 Chronic kidney disease, unspecified: Secondary | ICD-10-CM | POA: Diagnosis not present

## 2022-01-18 DIAGNOSIS — N390 Urinary tract infection, site not specified: Secondary | ICD-10-CM | POA: Diagnosis not present

## 2022-01-18 DIAGNOSIS — K219 Gastro-esophageal reflux disease without esophagitis: Secondary | ICD-10-CM | POA: Diagnosis not present

## 2022-01-18 DIAGNOSIS — R5381 Other malaise: Secondary | ICD-10-CM | POA: Diagnosis not present

## 2022-01-28 DIAGNOSIS — N189 Chronic kidney disease, unspecified: Secondary | ICD-10-CM | POA: Diagnosis not present

## 2022-01-28 DIAGNOSIS — R3911 Hesitancy of micturition: Secondary | ICD-10-CM | POA: Diagnosis not present

## 2022-01-28 DIAGNOSIS — N179 Acute kidney failure, unspecified: Secondary | ICD-10-CM | POA: Diagnosis not present

## 2022-01-28 DIAGNOSIS — N3281 Overactive bladder: Secondary | ICD-10-CM | POA: Diagnosis not present

## 2022-01-28 DIAGNOSIS — G4700 Insomnia, unspecified: Secondary | ICD-10-CM | POA: Diagnosis not present

## 2022-01-28 DIAGNOSIS — N390 Urinary tract infection, site not specified: Secondary | ICD-10-CM | POA: Diagnosis not present

## 2022-01-28 DIAGNOSIS — K5900 Constipation, unspecified: Secondary | ICD-10-CM | POA: Diagnosis not present

## 2022-01-28 DIAGNOSIS — E559 Vitamin D deficiency, unspecified: Secondary | ICD-10-CM | POA: Diagnosis not present

## 2022-01-28 DIAGNOSIS — F32A Depression, unspecified: Secondary | ICD-10-CM | POA: Diagnosis not present

## 2022-01-29 ENCOUNTER — Encounter: Payer: Self-pay | Admitting: Orthopaedic Surgery

## 2022-01-29 ENCOUNTER — Ambulatory Visit (INDEPENDENT_AMBULATORY_CARE_PROVIDER_SITE_OTHER): Payer: Medicare Other | Admitting: Orthopaedic Surgery

## 2022-01-29 ENCOUNTER — Ambulatory Visit: Payer: Self-pay

## 2022-01-29 VITALS — BP 99/64 | HR 97 | Ht 65.0 in | Wt 105.0 lb

## 2022-01-29 DIAGNOSIS — Z96642 Presence of left artificial hip joint: Secondary | ICD-10-CM

## 2022-01-29 NOTE — Progress Notes (Signed)
Post-Op Visit Note   Patient: Evelyn [name]           Date of Birth: 1925/03/23           MRN: [id number] Visit Date: 01/29/2022 PCP: Frederica Kuster, MD   Assessment & Plan: Follow-up hemiarthroplasty.  She is ambulating with therapy with a walker.  When she got up originally and fell and broke her hip she did not use her walker when she got up.  Chief Complaint:  Chief Complaint  Patient presents with   Left Hip - Follow-up    12/17/2021 left hip hemiarthroplasty   Visit Diagnoses:  1. Status post left hip replacement     Plan: Continue ambulation with her walker.  She will gradually work on increasing distance and stamina.  Follow-up here on as-needed basis.  She is happy with the results of surgery.  Follow-Up Instructions: No follow-ups on file.   Orders:  Orders Placed This Encounter  Procedures   XR HIP UNILAT W OR W/O PELVIS 2-3 VIEWS LEFT   No orders of the defined types were placed in this encounter.   Imaging: No results found.  PMFS History: Patient Active Problem List   Diagnosis Date Noted   Pyuria 12/18/2021   Fever 12/18/2021   Macrocytic anemia 12/18/2021   Malnutrition of moderate degree 12/18/2021   Fracture of femoral neck, left (HCC) 12/17/2021   Hip fracture (HCC) 12/17/2021   Delirium 11/09/2021   HTN (hypertension) 11/08/2021   Hypokalemia 11/08/2021   Compression fracture of body of thoracic vertebra (HCC) 11/06/2021   Acute anemia 11/06/2021   Cognitive impairment 11/06/2021   Lactic acidosis 11/06/2021   Black stools    Heme + stool    Peptic ulcer disease    Acute metabolic encephalopathy 11/05/2021   Closed wedge compression fracture of T12 vertebra (HCC) 11/24/2016   Chronic midline low back pain 11/18/2016   Chronic renal disease, stage 3, moderately decreased glomerular filtration rate (GFR) between 30-59 mL/min/1.73 square meter (HCC) 11/18/2016   IBS (irritable bowel syndrome)    UTI (urinary tract infection)     Routine general medical examination at a health care facility 05/03/2013   Chronic idiopathic constipation 03/31/2012   Other screening mammogram 03/24/2011   Depression with somatization 03/06/2010   Insomnia 03/08/2008   Hyperlipidemia with target LDL less than 130 02/21/2007   Past Medical History:  Diagnosis Date   Allergy    Arthritis    Chronic cystitis    Congenital blindness    right eye   Depression    Gastric ulcer    H/O: hematuria    Hepatitis, unspecified    statin related, resolved quickly   Hyperlipidemia    Legally blind in right eye, as defined in [place]    Syncope and collapse 04/01/2014   UTI (lower urinary tract infection)    HISTORY OF UTI    Family History  Problem Relation Age of Onset   Cancer Neg Hx        Colon cancer   Stroke Neg Hx    Heart disease Neg Hx    Hyperlipidemia Neg Hx    Hypertension Neg Hx    Kidney disease Neg Hx    Colon polyps Neg Hx    Colon cancer Neg Hx     Past Surgical History:  Procedure Laterality Date   ABDOMINAL HYSTERECTOMY     vaginal   BLADDER SUSPENSION     A-P   CATARACT EXTRACTION W/  INTRAOCULAR LENS IMPLANT Left    ESOPHAGOGASTRODUODENOSCOPY     HIP ARTHROPLASTY Left 12/17/2021   Procedure: ARTHROPLASTY BIPOLAR HIP (HEMIARTHROPLASTY);  Surgeon: Eldred Manges, MD;  Location: [facility];  Service: Orthopedics;  Laterality: Left;   OPEN REDUCTION INTERNAL FIXATION (ORIF) DISTAL RADIAL FRACTURE Right 03/09/2016   Procedure: OPEN REDUCTION INTERNAL FIXATION (ORIF) DISTAL RADIAL FRACTURE;  Surgeon: Betha Loa, MD;  Location: [facility];  Service: Orthopedics;  Laterality: Right;   Social History   Occupational History   Occupation: Retired  Tobacco Use   Smoking status: Never   Smokeless tobacco: Never  Vaping Use   Vaping Use: Never used  Substance and Sexual Activity   Alcohol use: No   Drug use: No   Sexual activity: Not Currently

## 2022-02-01 DIAGNOSIS — K219 Gastro-esophageal reflux disease without esophagitis: Secondary | ICD-10-CM | POA: Diagnosis not present

## 2022-02-01 DIAGNOSIS — K5900 Constipation, unspecified: Secondary | ICD-10-CM | POA: Diagnosis not present

## 2022-02-01 DIAGNOSIS — R4189 Other symptoms and signs involving cognitive functions and awareness: Secondary | ICD-10-CM | POA: Diagnosis not present

## 2022-02-01 DIAGNOSIS — E559 Vitamin D deficiency, unspecified: Secondary | ICD-10-CM | POA: Diagnosis not present

## 2022-02-01 DIAGNOSIS — D649 Anemia, unspecified: Secondary | ICD-10-CM | POA: Diagnosis not present

## 2022-02-01 DIAGNOSIS — N3281 Overactive bladder: Secondary | ICD-10-CM | POA: Diagnosis not present

## 2022-02-01 DIAGNOSIS — F32A Depression, unspecified: Secondary | ICD-10-CM | POA: Diagnosis not present

## 2022-02-01 DIAGNOSIS — I7090 Unspecified atherosclerosis: Secondary | ICD-10-CM | POA: Diagnosis not present

## 2022-02-01 DIAGNOSIS — I10 Essential (primary) hypertension: Secondary | ICD-10-CM | POA: Diagnosis not present

## 2022-02-01 DIAGNOSIS — S72002D Fracture of unspecified part of neck of left femur, subsequent encounter for closed fracture with routine healing: Secondary | ICD-10-CM | POA: Diagnosis not present

## 2022-02-01 DIAGNOSIS — G4700 Insomnia, unspecified: Secondary | ICD-10-CM | POA: Diagnosis not present

## 2022-02-01 DIAGNOSIS — N189 Chronic kidney disease, unspecified: Secondary | ICD-10-CM | POA: Diagnosis not present

## 2022-02-10 DIAGNOSIS — N390 Urinary tract infection, site not specified: Secondary | ICD-10-CM | POA: Diagnosis not present

## 2022-02-12 ENCOUNTER — Ambulatory Visit: Payer: Medicare Other | Admitting: Gastroenterology

## 2022-02-15 DIAGNOSIS — K582 Mixed irritable bowel syndrome: Secondary | ICD-10-CM | POA: Diagnosis not present

## 2022-02-15 DIAGNOSIS — D649 Anemia, unspecified: Secondary | ICD-10-CM | POA: Diagnosis not present

## 2022-02-15 DIAGNOSIS — M6281 Muscle weakness (generalized): Secondary | ICD-10-CM | POA: Diagnosis not present

## 2022-02-15 DIAGNOSIS — E785 Hyperlipidemia, unspecified: Secondary | ICD-10-CM | POA: Diagnosis not present

## 2022-02-15 DIAGNOSIS — E559 Vitamin D deficiency, unspecified: Secondary | ICD-10-CM | POA: Diagnosis not present

## 2022-02-15 DIAGNOSIS — K219 Gastro-esophageal reflux disease without esophagitis: Secondary | ICD-10-CM | POA: Diagnosis not present

## 2022-02-15 DIAGNOSIS — R278 Other lack of coordination: Secondary | ICD-10-CM | POA: Diagnosis not present

## 2022-02-15 DIAGNOSIS — M545 Low back pain, unspecified: Secondary | ICD-10-CM | POA: Diagnosis not present

## 2022-02-15 DIAGNOSIS — N390 Urinary tract infection, site not specified: Secondary | ICD-10-CM | POA: Diagnosis not present

## 2022-02-15 DIAGNOSIS — N3281 Overactive bladder: Secondary | ICD-10-CM | POA: Diagnosis not present

## 2022-02-15 DIAGNOSIS — R600 Localized edema: Secondary | ICD-10-CM | POA: Diagnosis not present

## 2022-02-16 DIAGNOSIS — R2681 Unsteadiness on feet: Secondary | ICD-10-CM | POA: Diagnosis not present

## 2022-02-16 DIAGNOSIS — M6281 Muscle weakness (generalized): Secondary | ICD-10-CM | POA: Diagnosis not present

## 2022-02-16 DIAGNOSIS — Z9181 History of falling: Secondary | ICD-10-CM | POA: Diagnosis not present

## 2022-02-17 DIAGNOSIS — M6281 Muscle weakness (generalized): Secondary | ICD-10-CM | POA: Diagnosis not present

## 2022-02-17 DIAGNOSIS — R278 Other lack of coordination: Secondary | ICD-10-CM | POA: Diagnosis not present

## 2022-02-18 DIAGNOSIS — M1990 Unspecified osteoarthritis, unspecified site: Secondary | ICD-10-CM | POA: Diagnosis not present

## 2022-02-18 DIAGNOSIS — F331 Major depressive disorder, recurrent, moderate: Secondary | ICD-10-CM | POA: Diagnosis not present

## 2022-02-19 DIAGNOSIS — M6281 Muscle weakness (generalized): Secondary | ICD-10-CM | POA: Diagnosis not present

## 2022-02-19 DIAGNOSIS — R2681 Unsteadiness on feet: Secondary | ICD-10-CM | POA: Diagnosis not present

## 2022-02-19 DIAGNOSIS — R278 Other lack of coordination: Secondary | ICD-10-CM | POA: Diagnosis not present

## 2022-02-19 DIAGNOSIS — F331 Major depressive disorder, recurrent, moderate: Secondary | ICD-10-CM | POA: Diagnosis not present

## 2022-02-19 DIAGNOSIS — Z9181 History of falling: Secondary | ICD-10-CM | POA: Diagnosis not present

## 2022-02-19 DIAGNOSIS — F5102 Adjustment insomnia: Secondary | ICD-10-CM | POA: Diagnosis not present

## 2022-02-23 DIAGNOSIS — N3020 Other chronic cystitis without hematuria: Secondary | ICD-10-CM | POA: Diagnosis not present

## 2022-02-24 DIAGNOSIS — R2681 Unsteadiness on feet: Secondary | ICD-10-CM | POA: Diagnosis not present

## 2022-02-24 DIAGNOSIS — M6281 Muscle weakness (generalized): Secondary | ICD-10-CM | POA: Diagnosis not present

## 2022-02-24 DIAGNOSIS — Z9181 History of falling: Secondary | ICD-10-CM | POA: Diagnosis not present

## 2022-02-24 DIAGNOSIS — R278 Other lack of coordination: Secondary | ICD-10-CM | POA: Diagnosis not present

## 2022-02-26 DIAGNOSIS — R278 Other lack of coordination: Secondary | ICD-10-CM | POA: Diagnosis not present

## 2022-02-26 DIAGNOSIS — M6281 Muscle weakness (generalized): Secondary | ICD-10-CM | POA: Diagnosis not present

## 2022-02-27 DIAGNOSIS — R2681 Unsteadiness on feet: Secondary | ICD-10-CM | POA: Diagnosis not present

## 2022-02-27 DIAGNOSIS — Z9181 History of falling: Secondary | ICD-10-CM | POA: Diagnosis not present

## 2022-02-27 DIAGNOSIS — M6281 Muscle weakness (generalized): Secondary | ICD-10-CM | POA: Diagnosis not present

## 2022-03-01 DIAGNOSIS — M6281 Muscle weakness (generalized): Secondary | ICD-10-CM | POA: Diagnosis not present

## 2022-03-01 DIAGNOSIS — R278 Other lack of coordination: Secondary | ICD-10-CM | POA: Diagnosis not present

## 2022-03-02 DIAGNOSIS — K582 Mixed irritable bowel syndrome: Secondary | ICD-10-CM | POA: Diagnosis not present

## 2022-03-02 DIAGNOSIS — D649 Anemia, unspecified: Secondary | ICD-10-CM | POA: Diagnosis not present

## 2022-03-02 DIAGNOSIS — E785 Hyperlipidemia, unspecified: Secondary | ICD-10-CM | POA: Diagnosis not present

## 2022-03-02 DIAGNOSIS — M6281 Muscle weakness (generalized): Secondary | ICD-10-CM | POA: Diagnosis not present

## 2022-03-02 DIAGNOSIS — M545 Low back pain, unspecified: Secondary | ICD-10-CM | POA: Diagnosis not present

## 2022-03-02 DIAGNOSIS — R2681 Unsteadiness on feet: Secondary | ICD-10-CM | POA: Diagnosis not present

## 2022-03-02 DIAGNOSIS — K219 Gastro-esophageal reflux disease without esophagitis: Secondary | ICD-10-CM | POA: Diagnosis not present

## 2022-03-02 DIAGNOSIS — N3281 Overactive bladder: Secondary | ICD-10-CM | POA: Diagnosis not present

## 2022-03-02 DIAGNOSIS — Z9181 History of falling: Secondary | ICD-10-CM | POA: Diagnosis not present

## 2022-03-02 DIAGNOSIS — R600 Localized edema: Secondary | ICD-10-CM | POA: Diagnosis not present

## 2022-03-02 DIAGNOSIS — E559 Vitamin D deficiency, unspecified: Secondary | ICD-10-CM | POA: Diagnosis not present

## 2022-03-02 DIAGNOSIS — N390 Urinary tract infection, site not specified: Secondary | ICD-10-CM | POA: Diagnosis not present

## 2022-03-04 DIAGNOSIS — R278 Other lack of coordination: Secondary | ICD-10-CM | POA: Diagnosis not present

## 2022-03-04 DIAGNOSIS — Z9181 History of falling: Secondary | ICD-10-CM | POA: Diagnosis not present

## 2022-03-04 DIAGNOSIS — R2681 Unsteadiness on feet: Secondary | ICD-10-CM | POA: Diagnosis not present

## 2022-03-04 DIAGNOSIS — M6281 Muscle weakness (generalized): Secondary | ICD-10-CM | POA: Diagnosis not present

## 2022-03-05 DIAGNOSIS — R278 Other lack of coordination: Secondary | ICD-10-CM | POA: Diagnosis not present

## 2022-03-05 DIAGNOSIS — M6281 Muscle weakness (generalized): Secondary | ICD-10-CM | POA: Diagnosis not present

## 2022-03-08 DIAGNOSIS — M6281 Muscle weakness (generalized): Secondary | ICD-10-CM | POA: Diagnosis not present

## 2022-03-08 DIAGNOSIS — R278 Other lack of coordination: Secondary | ICD-10-CM | POA: Diagnosis not present

## 2022-03-11 DIAGNOSIS — M6281 Muscle weakness (generalized): Secondary | ICD-10-CM | POA: Diagnosis not present

## 2022-03-11 DIAGNOSIS — Z9181 History of falling: Secondary | ICD-10-CM | POA: Diagnosis not present

## 2022-03-11 DIAGNOSIS — R278 Other lack of coordination: Secondary | ICD-10-CM | POA: Diagnosis not present

## 2022-03-11 DIAGNOSIS — R2681 Unsteadiness on feet: Secondary | ICD-10-CM | POA: Diagnosis not present

## 2022-03-12 DIAGNOSIS — R278 Other lack of coordination: Secondary | ICD-10-CM | POA: Diagnosis not present

## 2022-03-12 DIAGNOSIS — M6281 Muscle weakness (generalized): Secondary | ICD-10-CM | POA: Diagnosis not present

## 2022-03-15 ENCOUNTER — Telehealth: Payer: Self-pay

## 2022-03-15 DIAGNOSIS — K582 Mixed irritable bowel syndrome: Secondary | ICD-10-CM | POA: Diagnosis not present

## 2022-03-15 DIAGNOSIS — N3281 Overactive bladder: Secondary | ICD-10-CM | POA: Diagnosis not present

## 2022-03-15 DIAGNOSIS — M6281 Muscle weakness (generalized): Secondary | ICD-10-CM | POA: Diagnosis not present

## 2022-03-15 DIAGNOSIS — Z9181 History of falling: Secondary | ICD-10-CM | POA: Diagnosis not present

## 2022-03-15 DIAGNOSIS — R2681 Unsteadiness on feet: Secondary | ICD-10-CM | POA: Diagnosis not present

## 2022-03-15 DIAGNOSIS — G894 Chronic pain syndrome: Secondary | ICD-10-CM | POA: Diagnosis not present

## 2022-03-15 DIAGNOSIS — K219 Gastro-esophageal reflux disease without esophagitis: Secondary | ICD-10-CM | POA: Diagnosis not present

## 2022-03-15 DIAGNOSIS — F331 Major depressive disorder, recurrent, moderate: Secondary | ICD-10-CM | POA: Diagnosis not present

## 2022-03-15 DIAGNOSIS — R278 Other lack of coordination: Secondary | ICD-10-CM | POA: Diagnosis not present

## 2022-03-15 NOTE — Telephone Encounter (Signed)
Patient's son,[name] (POA) called and left voicemail message on clinical intake voicemail. He was inquiring about whether you recommend the latest COVID booster for patient.  Message routed to Dr. Alain Honey.

## 2022-03-16 DIAGNOSIS — B9620 Unspecified Escherichia coli [E. coli] as the cause of diseases classified elsewhere: Secondary | ICD-10-CM | POA: Diagnosis not present

## 2022-03-16 DIAGNOSIS — N390 Urinary tract infection, site not specified: Secondary | ICD-10-CM | POA: Diagnosis not present

## 2022-03-16 DIAGNOSIS — F5102 Adjustment insomnia: Secondary | ICD-10-CM | POA: Diagnosis not present

## 2022-03-16 DIAGNOSIS — F331 Major depressive disorder, recurrent, moderate: Secondary | ICD-10-CM | POA: Diagnosis not present

## 2022-03-17 DIAGNOSIS — R278 Other lack of coordination: Secondary | ICD-10-CM | POA: Diagnosis not present

## 2022-03-17 DIAGNOSIS — M6281 Muscle weakness (generalized): Secondary | ICD-10-CM | POA: Diagnosis not present

## 2022-03-19 DIAGNOSIS — R278 Other lack of coordination: Secondary | ICD-10-CM | POA: Diagnosis not present

## 2022-03-19 DIAGNOSIS — M6281 Muscle weakness (generalized): Secondary | ICD-10-CM | POA: Diagnosis not present

## 2022-03-19 NOTE — Telephone Encounter (Signed)
Wardell Honour, MD to Carroll Kinds, CMA      03/16/22  8:48 AM We do recommend Covid booster

## 2022-03-19 NOTE — Telephone Encounter (Signed)
Patient son Tressie Ellis called and notified.

## 2022-03-24 DIAGNOSIS — M6281 Muscle weakness (generalized): Secondary | ICD-10-CM | POA: Diagnosis not present

## 2022-03-24 DIAGNOSIS — R278 Other lack of coordination: Secondary | ICD-10-CM | POA: Diagnosis not present

## 2022-03-26 DIAGNOSIS — R278 Other lack of coordination: Secondary | ICD-10-CM | POA: Diagnosis not present

## 2022-03-26 DIAGNOSIS — M6281 Muscle weakness (generalized): Secondary | ICD-10-CM | POA: Diagnosis not present

## 2022-03-29 DIAGNOSIS — N390 Urinary tract infection, site not specified: Secondary | ICD-10-CM | POA: Diagnosis not present

## 2022-03-29 DIAGNOSIS — G894 Chronic pain syndrome: Secondary | ICD-10-CM | POA: Diagnosis not present

## 2022-03-31 ENCOUNTER — Other Ambulatory Visit: Payer: Self-pay

## 2022-03-31 ENCOUNTER — Emergency Department (HOSPITAL_BASED_OUTPATIENT_CLINIC_OR_DEPARTMENT_OTHER)
Admission: EM | Admit: 2022-03-31 | Discharge: 2022-03-31 | Disposition: A | Discharge status: Home / Self Care | Payer: Medicare Other | Source: Home / Self Care | Attending: Emergency Medicine | Admitting: Emergency Medicine

## 2022-03-31 ENCOUNTER — Encounter (HOSPITAL_BASED_OUTPATIENT_CLINIC_OR_DEPARTMENT_OTHER): Payer: Self-pay | Admitting: Emergency Medicine

## 2022-03-31 DIAGNOSIS — Z66 Do not resuscitate: Secondary | ICD-10-CM | POA: Diagnosis present

## 2022-03-31 DIAGNOSIS — N390 Urinary tract infection, site not specified: Secondary | ICD-10-CM | POA: Insufficient documentation

## 2022-03-31 DIAGNOSIS — N281 Cyst of kidney, acquired: Secondary | ICD-10-CM | POA: Diagnosis not present

## 2022-03-31 DIAGNOSIS — R079 Chest pain, unspecified: Secondary | ICD-10-CM | POA: Diagnosis not present

## 2022-03-31 DIAGNOSIS — N12 Tubulo-interstitial nephritis, not specified as acute or chronic: Secondary | ICD-10-CM | POA: Diagnosis not present

## 2022-03-31 DIAGNOSIS — Z8249 Family history of ischemic heart disease and other diseases of the circulatory system: Secondary | ICD-10-CM | POA: Diagnosis not present

## 2022-03-31 DIAGNOSIS — F32A Depression, unspecified: Secondary | ICD-10-CM | POA: Diagnosis not present

## 2022-03-31 DIAGNOSIS — R4182 Altered mental status, unspecified: Secondary | ICD-10-CM | POA: Diagnosis not present

## 2022-03-31 DIAGNOSIS — R410 Disorientation, unspecified: Secondary | ICD-10-CM | POA: Diagnosis not present

## 2022-03-31 DIAGNOSIS — R531 Weakness: Secondary | ICD-10-CM | POA: Insufficient documentation

## 2022-03-31 DIAGNOSIS — Z743 Need for continuous supervision: Secondary | ICD-10-CM | POA: Diagnosis not present

## 2022-03-31 DIAGNOSIS — R278 Other lack of coordination: Secondary | ICD-10-CM | POA: Diagnosis not present

## 2022-03-31 DIAGNOSIS — Z79899 Other long term (current) drug therapy: Secondary | ICD-10-CM | POA: Insufficient documentation

## 2022-03-31 DIAGNOSIS — G8929 Other chronic pain: Secondary | ICD-10-CM | POA: Diagnosis not present

## 2022-03-31 DIAGNOSIS — I700 Atherosclerosis of aorta: Secondary | ICD-10-CM | POA: Diagnosis not present

## 2022-03-31 DIAGNOSIS — Z7982 Long term (current) use of aspirin: Secondary | ICD-10-CM | POA: Diagnosis not present

## 2022-03-31 DIAGNOSIS — I959 Hypotension, unspecified: Secondary | ICD-10-CM | POA: Diagnosis not present

## 2022-03-31 DIAGNOSIS — M419 Scoliosis, unspecified: Secondary | ICD-10-CM | POA: Diagnosis not present

## 2022-03-31 DIAGNOSIS — N189 Chronic kidney disease, unspecified: Secondary | ICD-10-CM | POA: Insufficient documentation

## 2022-03-31 DIAGNOSIS — H5461 Unqualified visual loss, right eye, normal vision left eye: Secondary | ICD-10-CM | POA: Diagnosis present

## 2022-03-31 DIAGNOSIS — R5381 Other malaise: Secondary | ICD-10-CM | POA: Diagnosis not present

## 2022-03-31 DIAGNOSIS — Z96642 Presence of left artificial hip joint: Secondary | ICD-10-CM | POA: Diagnosis present

## 2022-03-31 DIAGNOSIS — J189 Pneumonia, unspecified organism: Secondary | ICD-10-CM | POA: Diagnosis not present

## 2022-03-31 DIAGNOSIS — I129 Hypertensive chronic kidney disease with stage 1 through stage 4 chronic kidney disease, or unspecified chronic kidney disease: Secondary | ICD-10-CM | POA: Diagnosis present

## 2022-03-31 DIAGNOSIS — E860 Dehydration: Secondary | ICD-10-CM | POA: Diagnosis present

## 2022-03-31 DIAGNOSIS — E785 Hyperlipidemia, unspecified: Secondary | ICD-10-CM | POA: Diagnosis present

## 2022-03-31 DIAGNOSIS — M5440 Lumbago with sciatica, unspecified side: Secondary | ICD-10-CM | POA: Diagnosis not present

## 2022-03-31 DIAGNOSIS — F450 Somatization disorder: Secondary | ICD-10-CM | POA: Diagnosis not present

## 2022-03-31 DIAGNOSIS — M545 Low back pain, unspecified: Secondary | ICD-10-CM | POA: Diagnosis present

## 2022-03-31 DIAGNOSIS — Z83438 Family history of other disorder of lipoprotein metabolism and other lipidemia: Secondary | ICD-10-CM | POA: Diagnosis not present

## 2022-03-31 DIAGNOSIS — M549 Dorsalgia, unspecified: Secondary | ICD-10-CM | POA: Diagnosis not present

## 2022-03-31 DIAGNOSIS — Z681 Body mass index (BMI) 19 or less, adult: Secondary | ICD-10-CM | POA: Diagnosis not present

## 2022-03-31 DIAGNOSIS — M6281 Muscle weakness (generalized): Secondary | ICD-10-CM | POA: Diagnosis not present

## 2022-03-31 DIAGNOSIS — R627 Adult failure to thrive: Secondary | ICD-10-CM | POA: Diagnosis not present

## 2022-03-31 DIAGNOSIS — J479 Bronchiectasis, uncomplicated: Secondary | ICD-10-CM | POA: Diagnosis not present

## 2022-03-31 DIAGNOSIS — D7389 Other diseases of spleen: Secondary | ICD-10-CM | POA: Diagnosis not present

## 2022-03-31 DIAGNOSIS — K449 Diaphragmatic hernia without obstruction or gangrene: Secondary | ICD-10-CM | POA: Diagnosis not present

## 2022-03-31 DIAGNOSIS — Z1152 Encounter for screening for COVID-19: Secondary | ICD-10-CM | POA: Diagnosis not present

## 2022-03-31 DIAGNOSIS — R4189 Other symptoms and signs involving cognitive functions and awareness: Secondary | ICD-10-CM | POA: Diagnosis not present

## 2022-03-31 DIAGNOSIS — N1831 Chronic kidney disease, stage 3a: Secondary | ICD-10-CM | POA: Diagnosis not present

## 2022-03-31 LAB — CBC
HCT: 36.5 % (ref 36.0–46.0)
Hemoglobin: 11.7 g/dL — ABNORMAL LOW (ref 12.0–15.0)
MCH: 29.3 pg (ref 26.0–34.0)
MCHC: 32.1 g/dL (ref 30.0–36.0)
MCV: 91.5 fL (ref 80.0–100.0)
Platelets: 275 10*3/uL (ref 150–400)
RBC: 3.99 MIL/uL (ref 3.87–5.11)
RDW: 15 % (ref 11.5–15.5)
WBC: 11.8 10*3/uL — ABNORMAL HIGH (ref 4.0–10.5)
nRBC: 0 % (ref 0.0–0.2)

## 2022-03-31 LAB — BASIC METABOLIC PANEL
Anion gap: 11 (ref 5–15)
BUN: 37 mg/dL — ABNORMAL HIGH (ref 8–23)
CO2: 19 mmol/L — ABNORMAL LOW (ref 22–32)
Calcium: 10.4 mg/dL — ABNORMAL HIGH (ref 8.9–10.3)
Chloride: 106 mmol/L (ref 98–111)
Creatinine, Ser: 1.2 mg/dL — ABNORMAL HIGH (ref 0.44–1.00)
GFR, Estimated: 42 mL/min — ABNORMAL LOW (ref >60)
Glucose, Bld: 103 mg/dL — ABNORMAL HIGH (ref 70–99)
Potassium: 4.1 mmol/L (ref 3.5–5.1)
Sodium: 136 mmol/L (ref 135–145)

## 2022-03-31 LAB — CK: Total CK: 23 U/L — ABNORMAL LOW (ref 38–234)

## 2022-03-31 LAB — MAGNESIUM: Magnesium: 2 mg/dL (ref 1.7–2.4)

## 2022-03-31 LAB — SARS CORONAVIRUS 2 BY RT PCR: SARS Coronavirus 2 by RT PCR: NEGATIVE

## 2022-03-31 LAB — CBG MONITORING, ED: Glucose-Capillary: 87 mg/dL (ref 70–99)

## 2022-03-31 MED ORDER — SODIUM CHLORIDE 0.9 % IV SOLN
1.0000 g | Freq: Once | INTRAVENOUS | Status: AC
  Administered 2022-03-31: 1 g via INTRAVENOUS
  Filled 2022-03-31: qty 10

## 2022-03-31 MED ORDER — NITROFURANTOIN MONOHYD MACRO 100 MG PO CAPS
100.0000 mg | ORAL_CAPSULE | Freq: Once | ORAL | Status: AC
  Administered 2022-03-31: 100 mg via ORAL
  Filled 2022-03-31: qty 1

## 2022-03-31 MED ORDER — CEPHALEXIN 500 MG PO CAPS: 500.0000 mg | ORAL_CAPSULE | Freq: Three times a day (TID) | ORAL | 0 refills | Status: DC

## 2022-03-31 MED ORDER — SODIUM CHLORIDE 0.9 % IV BOLUS
500.0000 mL | Freq: Once | INTRAVENOUS | Status: AC
Start: 2022-03-31 — End: 2022-03-31
  Administered 2022-03-31: 500 mL via INTRAVENOUS

## 2022-03-31 MED ORDER — FOSFOMYCIN TROMETHAMINE 3 G PO PACK
3.0000 g | PACK | Freq: Once | ORAL | Status: AC
  Administered 2022-03-31: 3 g via ORAL
  Filled 2022-03-31: qty 3

## 2022-03-31 NOTE — Discharge Instructions (Addendum)
Ms. Evelyn [name] was seen in the emergency room for generalized weakness.  In the emergency room, her blood work is reassuring.  There is no evidence of any renal failure, severe electrolyte abnormality.  Her white count is normal.  Hemoglobin is normal.  She was able to ambulate independently with an assist device.  We have given her a dose of fosfomycin, as there is concerns for UTI that is not responding to [place].  She also mentions flank pain on the left side, there is concern for possible pyelonephritis therefore we are starting her on Keflex.  We recommend that you have the medical staff at the facility reassess her as soon as possible.  It might be worthwhile for her to get physical therapy as well if possible.  Ms. Evelyn [name] can return to the emergency room if there is any increasing weakness, confusion, severe nausea or vomiting, high fevers.

## 2022-03-31 NOTE — ED Notes (Signed)
Patient ambulated very well with walker up and down the hallway.

## 2022-03-31 NOTE — ED Notes (Signed)
Pt ambulated to the bathroom well with a walker and stand by assist.

## 2022-03-31 NOTE — ED Triage Notes (Signed)
On antibiotic for UTI, severe back pain and left flank pain started yesterday.had partial left hip surgery in July. Her lower extr. And wrist have started swelling past few days. Her energy is low.

## 2022-03-31 NOTE — ED Notes (Signed)
Patient reassessed as she felt lightheaded in the Waiting Area. CBG and Patient reassessed. Patient currently awaiting Examination Room for EDP Assessment.

## 2022-03-31 NOTE — ED Notes (Signed)
Provided pt with water.

## 2022-03-31 NOTE — ED Provider Notes (Signed)
[facility] EMERGENCY DEPT Provider Note   CSN: [id number] Arrival date & time: 03/31/22  1414     History  Chief Complaint  Patient presents with   Weakness    Evelyn [name] is a 88 y.o. female.  HPI     88 year old female comes in with chief complaint of generalized weakness.  Patient has history of hyperlipidemia, recurrent UTI, IBS and resides at a nursing home.  She is brought here by the daughter.  Allegedly, patient has had a chronic UTI issue because of incontinence.  She was on some antibiotics daily that was switched 2 weeks ago after urine cultures.  She is now on Macrobid.  Patient was profoundly weak today, having difficulty getting out of the bed therefore family brought her to the ER.  She also has reduced p.o. intake.  Initially patient told me that she had back pain, but she has arthritis and the pain is related to back.  Later on in patient's stay, at the time of discharge -the daughter states that patient actually was complaining of flank pain that is different than her hip pain.  Patient then confirms that the pain is higher up in the flank region.  Patient has urinary incontinence.  She has no pain with urination.  She has had UTI in the past with generalized weakness.   Home Medications Prior to Admission medications   Medication Sig Start Date End Date Taking? Authorizing Provider  aspirin EC 325 MG tablet Take 1 tablet (325 mg total) by mouth daily with breakfast. MUST TAKE AT LEAST 4 WEEKS POSTOP FOR DVT PROPHYLAXIS 12/19/21   Naida Sleight, PA-C  cholecalciferol (VITAMIN D3) 25 MCG (1000 UNIT) tablet Take 1,000 Units by mouth daily.    [provider]  docusate sodium (COLACE) 100 MG capsule Take 100 mg by mouth in the morning.    [provider]  FLUoxetine (PROZAC) 20 MG capsule Take 20 mg by mouth daily.    [provider]  furosemide (LASIX) 40 MG tablet Take 40 mg by mouth daily. 11/23/21   [provider]  hydrochlorothiazide (MICROZIDE) 12.5 MG capsule Take 12.5 mg by mouth daily. 12/09/21   [provider]  HYDROcodone-acetaminophen (NORCO/VICODIN) 5-325 MG tablet Take 1 tablet by mouth every 6 (six) hours as needed for moderate pain. 12/18/21   Naida Sleight, PA-C  mirtazapine (REMERON) 15 MG tablet Take 15 mg by mouth at bedtime. 01/11/22   [provider]  Multiple Vitamin (MULTIVITAMIN WITH MINERALS) TABS tablet Take 1 tablet by mouth daily. 12/21/21   Lanae Boast, MD  nitrofurantoin, macrocrystal-monohydrate, (MACROBID) 100 MG capsule Take 100 mg by mouth 2 (two) times daily. 03/19/22   [provider]  oxybutynin 2.5 MG TABS Take 2.5 mg by mouth 3 (three) times daily. 11/10/21   Leatha Gilding, MD  pantoprazole (PROTONIX) 40 MG tablet Take 1 tablet (40 mg total) by mouth daily before breakfast. 11/10/21   Gherghe, Daylene Katayama, MD  polyethylene glycol (MIRALAX / GLYCOLAX) 17 g packet Take 17 g by mouth daily.    [provider]  zolpidem (AMBIEN) 10 MG tablet Take 1/2 tablet by mouth once daily at bedtime as needed for sleep. Patient taking differently: Take 5 mg by mouth at bedtime. 07/01/21   Frederica Kuster, MD      Allergies    Patient has no known allergies.    Review of Systems   Review of Systems  All other systems reviewed  and are negative.   Physical Exam Updated Vital Signs BP (!) 155/102   Pulse 67   Temp (!) 97.5 F (36.4 C)   Resp 18   SpO2 96%  Physical Exam Vitals and nursing note reviewed.  Constitutional:      Appearance: She is well-developed.  HENT:     Head: Atraumatic.  Cardiovascular:     Rate and Rhythm: Normal rate.  Pulmonary:     Effort: Pulmonary effort is normal.  Abdominal:     Tenderness: There is no abdominal tenderness.  Musculoskeletal:     Cervical back: Normal range of motion and neck supple.  Skin:    General: Skin is warm and dry.  Neurological:     Mental Status: She is alert and  oriented to person, place, and time.     ED Results / Procedures / Treatments   Labs (all labs ordered are listed, but only abnormal results are displayed) Labs Reviewed  BASIC METABOLIC PANEL - Abnormal; Notable for the following components:      Result Value   CO2 19 (*)    Glucose, Bld 103 (*)    BUN 37 (*)    Creatinine, Ser 1.20 (*)    Calcium 10.4 (*)    GFR, Estimated 42 (*)    All other components within normal limits  CBC - Abnormal; Notable for the following components:   WBC 11.8 (*)    Hemoglobin 11.7 (*)    All other components within normal limits  CK - Abnormal; Notable for the following components:   Total CK 23 (*)    All other components within normal limits  SARS CORONAVIRUS 2 BY RT PCR  URINE CULTURE  MAGNESIUM  URINALYSIS, ROUTINE W REFLEX MICROSCOPIC  CBG MONITORING, ED    EKG EKG Interpretation  Date/Time:  Wednesday March 31 2022 14:37:38 EDT Ventricular Rate:  91 PR Interval:  138 QRS Duration: 72 QT Interval:  358 QTC Calculation: 440 R Axis:   55 Text Interpretation: Normal sinus rhythm Septal infarct , age undetermined Abnormal ECG When compared with ECG of 17-Dec-2021 06:41, PREVIOUS ECG IS PRESENT No acute changes No significant change since last tracing Confirmed by Derwood Kaplan [phone]) on 03/31/2022 6:31:21 PM  Radiology No results found.  Procedures Procedures    Medications Ordered in ED Medications  fosfomycin (MONUROL) packet 3 g (has no administration in time range)  nitrofurantoin (macrocrystal-monohydrate) (MACROBID) capsule 100 mg (has no administration in time range)  sodium chloride 0.9 % bolus 500 mL (500 mLs Intravenous New Bag/Given 03/31/22 1921)    ED Course/ Medical Decision Making/ A&P Clinical Course as of 03/31/22 2150  Wed Mar 31, 2022  2148 Patient's work-up is reassuring.  CBC, BMP are reassuring.  Renal function is normal for patient.  COVID-19 test is negative.  Spoke with patient's son over the  phone and daughter who is at the bedside.  It appears that patient is on Macrobid that will end on Friday.  They are concerned that perhaps Macrobid is not helping.  Patient was started on Macrobid because urine cultures likely showed susceptibility, as prior to that she was on a different antibiotic.  We have decided to give patient a dose of fosfomycin here.  We have discussed the results and family is comfortable with patient going home after fosfomycin.  We have also provided outpatient urology follow-up given recurrence of these infections. [AN]    Clinical Course User Index [AN] Derwood Kaplan, MD  Medical Decision Making Amount and/or Complexity of Data Reviewed Labs: ordered.  Risk Prescription drug management.   This patient presents to the ED with chief complaint(s) of weakness with pertinent past medical history of CKD, chronic UTI currently on Macrobid for the last 10 days for 2-week course.The complaint involves an extensive differential diagnosis and also carries with it a high risk of complications and morbidity.   The differential diagnosis includes acute kidney injury, severe electrolyte abnormality, dehydration, UTI, medication side effects, COVID-19.  Patient was started on oxycodone for her back pain recently.  The initial plan is to get basic labs and reassess the patient.  I do not see significant utility in getting urine analysis as she is already on Macrobid and there was cultures completed at the outpatient facility which led to selection of Macrobid as her antibiotic.   Additional history obtained: Additional history obtained from family Records reviewed  previous cultures of the urine in our system, in June she had pansensitive E. coli.  Independent labs interpretation:  The following labs were independently interpreted: CBC, BMP are reassuring and at baseline level for patient.  0 SIRS criteria on our assessment besides slightly  elevated heart rate in the 90s.   Treatment and Reassessment: Patient was ambulated in the ER with assist device by our staff.  Her labs are completely reassuring.  Results of been discussed with the patient and her family.  I had spoken with patient's brother as well.  Initial thought was that perhaps Macrobid is not helping.  I reassured them that the team has likely chosen Macrobid because of the susceptibilities.  Yet, I decided to give patient fosfomycin to alleviate some of their concerns.  Later it was added that patient is having more flank pain.  This changes direction of how we approach this infection.  This could be pyelonephritis, for which Macrobid is not the best antibiotic given its poor penetration.  Therefore we have given her IV ceftriaxone and change her to Keflex.  We will advised that the staff at the nursing facility assess her as soon as possible. Return precautions discussed.  Final Clinical Impression(s) / ED Diagnoses Final diagnoses:  Generalized weakness  Chronic UTI    Rx / DC Orders ED Discharge Orders     None         Varney Biles, MD 03/31/22 2358

## 2022-04-01 ENCOUNTER — Ambulatory Visit: Payer: Medicare Other | Admitting: Adult Health

## 2022-04-02 ENCOUNTER — Emergency Department (HOSPITAL_COMMUNITY): Payer: Medicare Other

## 2022-04-02 ENCOUNTER — Encounter (HOSPITAL_COMMUNITY): Payer: Self-pay

## 2022-04-02 ENCOUNTER — Other Ambulatory Visit: Payer: Self-pay

## 2022-04-02 ENCOUNTER — Inpatient Hospital Stay (HOSPITAL_COMMUNITY)
Admission: EM | Admit: 2022-04-02 | Discharge: 2022-04-06 | DRG: 194 | Disposition: A | Discharge status: Skilled Nursing Facility | Payer: Medicare Other | Source: Skilled Nursing Facility | Attending: Internal Medicine | Admitting: Internal Medicine

## 2022-04-02 DIAGNOSIS — I700 Atherosclerosis of aorta: Secondary | ICD-10-CM | POA: Diagnosis not present

## 2022-04-02 DIAGNOSIS — J189 Pneumonia, unspecified organism: Secondary | ICD-10-CM | POA: Diagnosis not present

## 2022-04-02 DIAGNOSIS — R410 Disorientation, unspecified: Secondary | ICD-10-CM

## 2022-04-02 DIAGNOSIS — Z7982 Long term (current) use of aspirin: Secondary | ICD-10-CM

## 2022-04-02 DIAGNOSIS — H5461 Unqualified visual loss, right eye, normal vision left eye: Secondary | ICD-10-CM | POA: Diagnosis present

## 2022-04-02 DIAGNOSIS — K449 Diaphragmatic hernia without obstruction or gangrene: Secondary | ICD-10-CM | POA: Diagnosis present

## 2022-04-02 DIAGNOSIS — N1831 Chronic kidney disease, stage 3a: Secondary | ICD-10-CM | POA: Diagnosis present

## 2022-04-02 DIAGNOSIS — R531 Weakness: Principal | ICD-10-CM

## 2022-04-02 DIAGNOSIS — M549 Dorsalgia, unspecified: Secondary | ICD-10-CM | POA: Diagnosis not present

## 2022-04-02 DIAGNOSIS — E785 Hyperlipidemia, unspecified: Secondary | ICD-10-CM | POA: Diagnosis present

## 2022-04-02 DIAGNOSIS — E860 Dehydration: Secondary | ICD-10-CM | POA: Diagnosis present

## 2022-04-02 DIAGNOSIS — I959 Hypotension, unspecified: Secondary | ICD-10-CM | POA: Diagnosis not present

## 2022-04-02 DIAGNOSIS — M419 Scoliosis, unspecified: Secondary | ICD-10-CM | POA: Diagnosis not present

## 2022-04-02 DIAGNOSIS — G8929 Other chronic pain: Secondary | ICD-10-CM | POA: Diagnosis present

## 2022-04-02 DIAGNOSIS — R278 Other lack of coordination: Secondary | ICD-10-CM | POA: Diagnosis not present

## 2022-04-02 DIAGNOSIS — Z681 Body mass index (BMI) 19 or less, adult: Secondary | ICD-10-CM

## 2022-04-02 DIAGNOSIS — M545 Low back pain, unspecified: Secondary | ICD-10-CM | POA: Diagnosis present

## 2022-04-02 DIAGNOSIS — Z1152 Encounter for screening for COVID-19: Secondary | ICD-10-CM

## 2022-04-02 DIAGNOSIS — F32A Depression, unspecified: Secondary | ICD-10-CM | POA: Diagnosis present

## 2022-04-02 DIAGNOSIS — Z96642 Presence of left artificial hip joint: Secondary | ICD-10-CM | POA: Diagnosis present

## 2022-04-02 DIAGNOSIS — R079 Chest pain, unspecified: Secondary | ICD-10-CM | POA: Diagnosis not present

## 2022-04-02 DIAGNOSIS — R4182 Altered mental status, unspecified: Secondary | ICD-10-CM | POA: Diagnosis not present

## 2022-04-02 DIAGNOSIS — Z83438 Family history of other disorder of lipoprotein metabolism and other lipidemia: Secondary | ICD-10-CM

## 2022-04-02 DIAGNOSIS — R5381 Other malaise: Secondary | ICD-10-CM | POA: Diagnosis present

## 2022-04-02 DIAGNOSIS — I129 Hypertensive chronic kidney disease with stage 1 through stage 4 chronic kidney disease, or unspecified chronic kidney disease: Secondary | ICD-10-CM | POA: Diagnosis present

## 2022-04-02 DIAGNOSIS — J479 Bronchiectasis, uncomplicated: Secondary | ICD-10-CM | POA: Diagnosis not present

## 2022-04-02 DIAGNOSIS — M6281 Muscle weakness (generalized): Secondary | ICD-10-CM | POA: Diagnosis not present

## 2022-04-02 DIAGNOSIS — N281 Cyst of kidney, acquired: Secondary | ICD-10-CM | POA: Diagnosis not present

## 2022-04-02 DIAGNOSIS — R627 Adult failure to thrive: Secondary | ICD-10-CM | POA: Diagnosis present

## 2022-04-02 DIAGNOSIS — Z66 Do not resuscitate: Secondary | ICD-10-CM | POA: Diagnosis present

## 2022-04-02 DIAGNOSIS — Z79899 Other long term (current) drug therapy: Secondary | ICD-10-CM

## 2022-04-02 DIAGNOSIS — D7389 Other diseases of spleen: Secondary | ICD-10-CM | POA: Diagnosis not present

## 2022-04-02 DIAGNOSIS — Z8249 Family history of ischemic heart disease and other diseases of the circulatory system: Secondary | ICD-10-CM

## 2022-04-02 DIAGNOSIS — R4189 Other symptoms and signs involving cognitive functions and awareness: Secondary | ICD-10-CM | POA: Diagnosis present

## 2022-04-02 DIAGNOSIS — F450 Somatization disorder: Secondary | ICD-10-CM | POA: Diagnosis present

## 2022-04-02 LAB — URINALYSIS, ROUTINE W REFLEX MICROSCOPIC
Bilirubin Urine: NEGATIVE
Glucose, UA: NEGATIVE mg/dL
Hgb urine dipstick: NEGATIVE
Ketones, ur: 5 mg/dL — AB
Leukocytes,Ua: NEGATIVE
Nitrite: NEGATIVE
Protein, ur: NEGATIVE mg/dL
Specific Gravity, Urine: 1.019 (ref 1.005–1.030)
pH: 5 (ref 5.0–8.0)

## 2022-04-02 LAB — CBC WITH DIFFERENTIAL/PLATELET
Abs Immature Granulocytes: 0.09 10*3/uL — ABNORMAL HIGH (ref 0.00–0.07)
Basophils Absolute: 0.1 10*3/uL (ref 0.0–0.1)
Basophils Relative: 1 %
Eosinophils Absolute: 0.4 10*3/uL (ref 0.0–0.5)
Eosinophils Relative: 5 %
HCT: 36.5 % (ref 36.0–46.0)
Hemoglobin: 11.6 g/dL — ABNORMAL LOW (ref 12.0–15.0)
Immature Granulocytes: 1 %
Lymphocytes Relative: 14 %
Lymphs Abs: 1.2 10*3/uL (ref 0.7–4.0)
MCH: 29.3 pg (ref 26.0–34.0)
MCHC: 31.8 g/dL (ref 30.0–36.0)
MCV: 92.2 fL (ref 80.0–100.0)
Monocytes Absolute: 0.8 10*3/uL (ref 0.1–1.0)
Monocytes Relative: 10 %
Neutro Abs: 5.7 10*3/uL (ref 1.7–7.7)
Neutrophils Relative %: 69 %
Platelets: 267 10*3/uL (ref 150–400)
RBC: 3.96 MIL/uL (ref 3.87–5.11)
RDW: 15 % (ref 11.5–15.5)
WBC: 8.4 10*3/uL (ref 4.0–10.5)
nRBC: 0 % (ref 0.0–0.2)

## 2022-04-02 LAB — COMPREHENSIVE METABOLIC PANEL
ALT: 31 U/L (ref 0–44)
AST: 54 U/L — ABNORMAL HIGH (ref 15–41)
Albumin: 2.7 g/dL — ABNORMAL LOW (ref 3.5–5.0)
Alkaline Phosphatase: 110 U/L (ref 38–126)
Anion gap: 7 (ref 5–15)
BUN: 26 mg/dL — ABNORMAL HIGH (ref 8–23)
CO2: 18 mmol/L — ABNORMAL LOW (ref 22–32)
Calcium: 10.1 mg/dL (ref 8.9–10.3)
Chloride: 113 mmol/L — ABNORMAL HIGH (ref 98–111)
Creatinine, Ser: 1.05 mg/dL — ABNORMAL HIGH (ref 0.44–1.00)
GFR, Estimated: 49 mL/min — ABNORMAL LOW (ref >60)
Glucose, Bld: 127 mg/dL — ABNORMAL HIGH (ref 70–99)
Potassium: 3.6 mmol/L (ref 3.5–5.1)
Sodium: 138 mmol/L (ref 135–145)
Total Bilirubin: 0.4 mg/dL (ref 0.3–1.2)
Total Protein: 6.5 g/dL (ref 6.5–8.1)

## 2022-04-02 LAB — SARS CORONAVIRUS 2 BY RT PCR: SARS Coronavirus 2 by RT PCR: NEGATIVE

## 2022-04-02 LAB — LIPASE, BLOOD: Lipase: 33 U/L (ref 11–51)

## 2022-04-02 MED ORDER — SODIUM CHLORIDE 0.9 % IV BOLUS
1000.0000 mL | Freq: Once | INTRAVENOUS | Status: AC
  Administered 2022-04-02: 1000 mL via INTRAVENOUS

## 2022-04-02 MED ORDER — PANTOPRAZOLE SODIUM 40 MG IV SOLR
40.0000 mg | Freq: Once | INTRAVENOUS | Status: AC
  Administered 2022-04-03: 40 mg via INTRAVENOUS
  Filled 2022-04-02: qty 10

## 2022-04-02 NOTE — ED Triage Notes (Signed)
Per EMS- patient is from [facility].  Patient c/o weakness x 2 weeks. Patient has had recurring UTI's, chronic abdominal and back pain.  Patient was seen at [facility] for the same 2-3 days ago.

## 2022-04-02 NOTE — ED Provider Triage Note (Signed)
Emergency Medicine Provider Triage Evaluation Note  DATHA KISSINGER , a 88 y.o. female  was evaluated in triage.  Pt complains of abd pain. Progressive weakness, with lower back pain and abd pain for the past week.  Recurrent UTI, seen in the ER several days prior and got abx treatment without improvement of sxs.  Endorse diarrhea today.  No hx of C diff.  Review of Systems  Positive: As above Negative: As above  Physical Exam  BP (!) 112/57 (BP Location: Left Arm)   Pulse 97   Temp 97.9 F (36.6 C) (Oral)   Resp 16   Ht 5\' 3"  (1.6 m)   Wt 45.4 kg   SpO2 93%   BMI 17.71 kg/m  Gen:   Awake, no distress   Resp:  Normal effort  MSK:   Moves extremities without difficulty  Other:  Mouth is dry  Medical Decision Making  Medically screening exam initiated at 4:37 PM.  Appropriate orders placed.  TOIA MICALE was informed that the remainder of the evaluation will be completed by another provider, this initial triage assessment does not replace that evaluation, and the importance of remaining in the ED until their evaluation is complete.     Domenic Moras, PA-C 04/02/22 1645

## 2022-04-02 NOTE — Social Work (Signed)
CSW spoke to to the Pt daughter, Pt daughter called with concerns of the mom's wait, she also wanted a pillow. The daughter states that the mom has been here since 2 pm and has been in a wheelchair while waiting. CSW called triage to see how much longer the wait would be. The Pt daughter stated that a tech gave the Pt's daughter the social worker number to see about a pillow and the wait. TOC is not consulted however made sure to care for the family and their current needs.

## 2022-04-02 NOTE — ED Provider Notes (Signed)
[facility]-EMERGENCY DEPT Provider Note   CSN: [id number] Arrival date & time: 04/02/22  1459     History {Add pertinent medical, surgical, social history, OB history to HPI:1} Chief Complaint  Patient presents with   Weakness   Abdominal Pain   Back Pain    Evelyn [name] is a 89 y.o. female.  Is brought in by ambulance from her assisted living for continued weakness.  She has had chronic back issues and poor feeding.  Has had multiple UTIs and has been on multiple antibiotics.  She was just seen 2 days ago at outside facility for continued symptoms.  Work-up was fairly unremarkable, given IV ceftriaxone and prescription for Keflex for possible UTI.  Since then she has had continued to have generalized weakness unable to stand or walk along with new diarrhea 2 episodes.  No fevers noted she denies any headache chest pain shortness of breath.  She does endorse some chronic low abdominal pain.  Family is very concerned that she has been bouncing in and out of emergency departments and urgent care without any real improvement.  The history is provided by the patient and a relative.  Weakness Severity:  Severe Onset quality:  Gradual Timing:  Constant Progression:  Worsening Chronicity:  New Context: dehydration and urinary tract infection   Relieved by:  Nothing Worsened by:  Activity Ineffective treatments:  Rest Associated symptoms: abdominal pain, diarrhea and difficulty walking   Associated symptoms: no cough, no fever, no headaches, no nausea, no shortness of breath and no vomiting   Abdominal Pain Associated symptoms: diarrhea   Associated symptoms: no cough, no fever, no nausea, no shortness of breath, no sore throat and no vomiting        Home Medications Prior to Admission medications   Medication Sig Start Date End Date Taking? Authorizing Provider  aspirin EC 325 MG tablet Take 1 tablet (325 mg total) by mouth daily with breakfast. MUST TAKE AT  LEAST 4 WEEKS POSTOP FOR DVT PROPHYLAXIS 12/19/21   Naida Sleight, PA-C  cephALEXin (KEFLEX) 500 MG capsule Take 1 capsule (500 mg total) by mouth 3 (three) times daily. 03/31/22   Derwood Kaplan, MD  cholecalciferol (VITAMIN D3) 25 MCG (1000 UNIT) tablet Take 1,000 Units by mouth daily.    [provider]  docusate sodium (COLACE) 100 MG capsule Take 100 mg by mouth in the morning.    [provider]  FLUoxetine (PROZAC) 20 MG capsule Take 20 mg by mouth daily.    [provider]  furosemide (LASIX) 40 MG tablet Take 40 mg by mouth daily. 11/23/21   [provider]  hydrochlorothiazide (MICROZIDE) 12.5 MG capsule Take 12.5 mg by mouth daily. 12/09/21   [provider]  HYDROcodone-acetaminophen (NORCO/VICODIN) 5-325 MG tablet Take 1 tablet by mouth every 6 (six) hours as needed for moderate pain. 12/18/21   Naida Sleight, PA-C  mirtazapine (REMERON) 15 MG tablet Take 15 mg by mouth at bedtime. 01/11/22   [provider]  Multiple Vitamin (MULTIVITAMIN WITH MINERALS) TABS tablet Take 1 tablet by mouth daily. 12/21/21   Lanae Boast, MD  nitrofurantoin, macrocrystal-monohydrate, (MACROBID) 100 MG capsule Take 100 mg by mouth 2 (two) times daily. 03/19/22   [provider]  oxybutynin 2.5 MG TABS Take 2.5 mg by mouth 3 (three) times daily. 11/10/21   Leatha Gilding, MD  pantoprazole (PROTONIX) 40 MG tablet Take 1 tablet (40 mg total) by mouth daily before breakfast. 11/10/21  Leatha Gilding, MD  polyethylene glycol (MIRALAX / GLYCOLAX) 17 g packet Take 17 g by mouth daily.    [provider]  zolpidem (AMBIEN) 10 MG tablet Take 1/2 tablet by mouth once daily at bedtime as needed for sleep. Patient taking differently: Take 5 mg by mouth at bedtime. 07/01/21   Frederica Kuster, MD      Allergies    Patient has no known allergies.    Review of Systems   Review of Systems  Constitutional:  Positive for appetite change. Negative for  fever.  HENT:  Negative for sore throat.   Respiratory:  Negative for cough and shortness of breath.   Gastrointestinal:  Positive for abdominal pain and diarrhea. Negative for nausea and vomiting.  Musculoskeletal:  Positive for back pain.  Skin:  Negative for rash.  Neurological:  Negative for headaches.    Physical Exam Updated Vital Signs BP (!) 117/56   Pulse 92   Temp (!) 97.4 F (36.3 C) (Oral)   Resp (!) 23   Ht 5\' 3"  (1.6 m)   Wt 45.4 kg   SpO2 97%   BMI 17.71 kg/m  Physical Exam Vitals and nursing note reviewed.  Constitutional:      General: She is not in acute distress.    Appearance: Normal appearance. She is underweight.  HENT:     Head: Normocephalic and atraumatic.  Eyes:     Conjunctiva/sclera: Conjunctivae normal.  Cardiovascular:     Rate and Rhythm: Normal rate and regular rhythm.     Heart sounds: No murmur heard. Pulmonary:     Effort: Pulmonary effort is normal. No respiratory distress.     Breath sounds: Normal breath sounds.  Abdominal:     Palpations: Abdomen is soft.     Tenderness: There is no abdominal tenderness. There is no guarding or rebound.  Musculoskeletal:        General: No deformity. Normal range of motion.     Cervical back: Neck supple.  Skin:    General: Skin is warm and dry.     Capillary Refill: Capillary refill takes less than 2 seconds.  Neurological:     General: No focal deficit present.     Mental Status: She is alert.     Sensory: No sensory deficit.     Motor: No weakness.     Comments: She is awake and alert.  She knows it is October although cannot remember where she lives.  She is moving all extremities without any focal deficits.     ED Results / Procedures / Treatments   Labs (all labs ordered are listed, but only abnormal results are displayed) Labs Reviewed  CBC WITH DIFFERENTIAL/PLATELET - Abnormal; Notable for the following components:      Result Value   Hemoglobin 11.6 (*)    Abs Immature  Granulocytes 0.09 (*)    All other components within normal limits  COMPREHENSIVE METABOLIC PANEL - Abnormal; Notable for the following components:   Chloride 113 (*)    CO2 18 (*)    Glucose, Bld 127 (*)    BUN 26 (*)    Creatinine, Ser 1.05 (*)    Albumin 2.7 (*)    AST 54 (*)    GFR, Estimated 49 (*)    All other components within normal limits  URINALYSIS, ROUTINE W REFLEX MICROSCOPIC - Abnormal; Notable for the following components:   Color, Urine AMBER (*)    APPearance HAZY (*)  Ketones, ur 5 (*)    All other components within normal limits  SARS CORONAVIRUS 2 BY RT PCR  C DIFFICILE QUICK SCREEN W PCR REFLEX    LIPASE, BLOOD    EKG EKG Interpretation  Date/Time:  Friday April 02 2022 17:05:40 EDT Ventricular Rate:  103 PR Interval:  142 QRS Duration: 87 QT Interval:  335 QTC Calculation: 439 R Axis:   67 Text Interpretation: Sinus tachycardia increased rate from prior 10/23 Confirmed by Aletta Edouard [phone]) on 04/02/2022 9:33:04 PM  Radiology CT Renal Stone Study  Result Date: 04/02/2022 CLINICAL DATA:  Flank pain EXAM: CT ABDOMEN AND PELVIS WITHOUT CONTRAST TECHNIQUE: Multidetector CT imaging of the abdomen and pelvis was performed following the standard protocol without IV contrast. RADIATION DOSE REDUCTION: This exam was performed according to the departmental dose-optimization program which includes automated exposure control, adjustment of the mA and/or kV according to patient size and/or use of iterative reconstruction technique. COMPARISON:  11/05/2021 FINDINGS: Lower chest: Coronary artery calcifications are seen. There is large fixed hiatal hernia. There are new patchy infiltrates in posterior lower lung fields on both sides. Left hemidiaphragm is elevated. Small bilateral pleural effusions are seen. Hepatobiliary: There is 12 mm low-density in the right lobe with no significant change, possibly cysts or hemangiomas. There is no dilation of bile ducts.  Gallbladder is not distended. Pancreas: No focal abnormalities are seen. Spleen: Coarse calcification is seen in the spleen. Spleen is not enlarged. Adrenals/Urinary Tract: Adrenals are unremarkable. There is no hydronephrosis. There are bilateral renal cysts largest in the upper pole of left kidney measuring 5.3 cm. There are no renal or ureteral stones. Evaluation of bladder is limited by beam hardening artifacts. Stomach/Bowel: Large fixed hiatal hernia is seen. Stomach is unremarkable. Small bowel loops are not dilated. Appendix is not seen. There is no pericecal inflammation. Vascular/Lymphatic: Extensive arterial calcifications are seen. Reproductive: Uterus is not seen. Beam hardening artifacts caused by surgical hardware in left hip limit evaluation of lower pelvis. Other: There is no ascites or pneumoperitoneum. Musculoskeletal: There is left hip arthroplasty. Degenerative changes are noted in lumbar spine. There is decrease in height of bodies of T12 and L3 vertebrae with no significant interval change. There is S shaped scoliosis in lumbar spine. IMPRESSION: There is no evidence of intestinal obstruction or pneumoperitoneum. There is no hydronephrosis. Coronary artery disease. Large fixed hiatal hernia. There are new patchy infiltrates in the lower lung fields, more so on the right side suggesting atelectasis/pneumonia. Minimal bilateral pleural effusions are seen. There is decrease in height of bodies of T12 and L3 vertebrae suggesting old compression fractures. Other findings as described in the body of the report. Electronically Signed   By: Elmer Picker M.D.   On: 04/02/2022 17:45    Procedures Procedures  {Document cardiac monitor, telemetry assessment procedure when appropriate:1}  Medications Ordered in ED Medications  sodium chloride 0.9 % bolus 1,000 mL (has no administration in time range)    ED Course/ Medical Decision Making/ A&P                           Medical Decision  Making Amount and/or Complexity of Data Reviewed Radiology: ordered.   This patient complains of ***; this involves an extensive number of treatment Options and is a complaint that carries with it a high risk of complications and morbidity. The differential includes ***  I ordered, reviewed and interpreted labs, which included *** I ordered  medication *** and reviewed PMP when indicated. I ordered imaging studies which included *** and I independently    visualized and interpreted imaging which showed *** Additional history obtained from *** Previous records obtained and reviewed *** I consulted *** and discussed lab and imaging findings and discussed disposition.  Cardiac monitoring reviewed, *** Social determinants considered, *** Critical Interventions: ***  After the interventions stated above, I reevaluated the patient and found *** Admission and further testing considered, ***   {Document critical care time when appropriate:1} {Document review of labs and clinical decision tools ie heart score, Chads2Vasc2 etc:1}  {Document your independent review of radiology images, and any outside records:1} {Document your discussion with family members, caretakers, and with consultants:1} {Document social determinants of health affecting pt's care:1} {Document your decision making why or why not admission, treatments were needed:1} Final Clinical Impression(s) / ED Diagnoses Final diagnoses:  None    Rx / DC Orders ED Discharge Orders     None

## 2022-04-03 DIAGNOSIS — Z1152 Encounter for screening for COVID-19: Secondary | ICD-10-CM | POA: Diagnosis not present

## 2022-04-03 DIAGNOSIS — M545 Low back pain, unspecified: Secondary | ICD-10-CM | POA: Diagnosis present

## 2022-04-03 DIAGNOSIS — F450 Somatization disorder: Secondary | ICD-10-CM | POA: Diagnosis not present

## 2022-04-03 DIAGNOSIS — G8929 Other chronic pain: Secondary | ICD-10-CM | POA: Diagnosis present

## 2022-04-03 DIAGNOSIS — R627 Adult failure to thrive: Secondary | ICD-10-CM | POA: Diagnosis present

## 2022-04-03 DIAGNOSIS — R410 Disorientation, unspecified: Secondary | ICD-10-CM | POA: Diagnosis not present

## 2022-04-03 DIAGNOSIS — J189 Pneumonia, unspecified organism: Secondary | ICD-10-CM | POA: Diagnosis present

## 2022-04-03 DIAGNOSIS — Z681 Body mass index (BMI) 19 or less, adult: Secondary | ICD-10-CM | POA: Diagnosis not present

## 2022-04-03 DIAGNOSIS — F32A Depression, unspecified: Secondary | ICD-10-CM | POA: Diagnosis present

## 2022-04-03 DIAGNOSIS — R4189 Other symptoms and signs involving cognitive functions and awareness: Secondary | ICD-10-CM | POA: Diagnosis present

## 2022-04-03 DIAGNOSIS — Z83438 Family history of other disorder of lipoprotein metabolism and other lipidemia: Secondary | ICD-10-CM | POA: Diagnosis not present

## 2022-04-03 DIAGNOSIS — H5461 Unqualified visual loss, right eye, normal vision left eye: Secondary | ICD-10-CM | POA: Diagnosis present

## 2022-04-03 DIAGNOSIS — Z8249 Family history of ischemic heart disease and other diseases of the circulatory system: Secondary | ICD-10-CM | POA: Diagnosis not present

## 2022-04-03 DIAGNOSIS — M5440 Lumbago with sciatica, unspecified side: Secondary | ICD-10-CM | POA: Diagnosis not present

## 2022-04-03 DIAGNOSIS — R531 Weakness: Secondary | ICD-10-CM | POA: Diagnosis not present

## 2022-04-03 DIAGNOSIS — Z66 Do not resuscitate: Secondary | ICD-10-CM | POA: Diagnosis present

## 2022-04-03 DIAGNOSIS — K449 Diaphragmatic hernia without obstruction or gangrene: Secondary | ICD-10-CM | POA: Diagnosis present

## 2022-04-03 DIAGNOSIS — Z79899 Other long term (current) drug therapy: Secondary | ICD-10-CM | POA: Diagnosis not present

## 2022-04-03 DIAGNOSIS — Z96642 Presence of left artificial hip joint: Secondary | ICD-10-CM | POA: Diagnosis present

## 2022-04-03 DIAGNOSIS — R5381 Other malaise: Secondary | ICD-10-CM | POA: Diagnosis not present

## 2022-04-03 DIAGNOSIS — E785 Hyperlipidemia, unspecified: Secondary | ICD-10-CM | POA: Diagnosis present

## 2022-04-03 DIAGNOSIS — N1831 Chronic kidney disease, stage 3a: Secondary | ICD-10-CM

## 2022-04-03 DIAGNOSIS — Z7982 Long term (current) use of aspirin: Secondary | ICD-10-CM | POA: Diagnosis not present

## 2022-04-03 DIAGNOSIS — E860 Dehydration: Secondary | ICD-10-CM | POA: Diagnosis present

## 2022-04-03 DIAGNOSIS — I129 Hypertensive chronic kidney disease with stage 1 through stage 4 chronic kidney disease, or unspecified chronic kidney disease: Secondary | ICD-10-CM | POA: Diagnosis present

## 2022-04-03 DIAGNOSIS — Z743 Need for continuous supervision: Secondary | ICD-10-CM | POA: Diagnosis not present

## 2022-04-03 LAB — CBC
HCT: 29.7 % — ABNORMAL LOW (ref 36.0–46.0)
Hemoglobin: 9.5 g/dL — ABNORMAL LOW (ref 12.0–15.0)
MCH: 29.4 pg (ref 26.0–34.0)
MCHC: 32 g/dL (ref 30.0–36.0)
MCV: 92 fL (ref 80.0–100.0)
Platelets: 218 10*3/uL (ref 150–400)
RBC: 3.23 MIL/uL — ABNORMAL LOW (ref 3.87–5.11)
RDW: 15.2 % (ref 11.5–15.5)
WBC: 6.4 10*3/uL (ref 4.0–10.5)
nRBC: 0 % (ref 0.0–0.2)

## 2022-04-03 LAB — BASIC METABOLIC PANEL
Anion gap: 2 — ABNORMAL LOW (ref 5–15)
BUN: 23 mg/dL (ref 8–23)
CO2: 18 mmol/L — ABNORMAL LOW (ref 22–32)
Calcium: 8.9 mg/dL (ref 8.9–10.3)
Chloride: 115 mmol/L — ABNORMAL HIGH (ref 98–111)
Creatinine, Ser: 0.91 mg/dL (ref 0.44–1.00)
GFR, Estimated: 58 mL/min — ABNORMAL LOW (ref >60)
Glucose, Bld: 96 mg/dL (ref 70–99)
Potassium: 3.5 mmol/L (ref 3.5–5.1)
Sodium: 135 mmol/L (ref 135–145)

## 2022-04-03 LAB — C DIFFICILE QUICK SCREEN W PCR REFLEX
C Diff antigen: NEGATIVE
C Diff interpretation: NOT DETECTED
C Diff toxin: NEGATIVE

## 2022-04-03 LAB — STREP PNEUMONIAE URINARY ANTIGEN: Strep Pneumo Urinary Antigen: NEGATIVE

## 2022-04-03 MED ORDER — ENOXAPARIN SODIUM 30 MG/0.3ML IJ SOSY
30.0000 mg | PREFILLED_SYRINGE | INTRAMUSCULAR | Status: DC
  Administered 2022-04-03 – 2022-04-05 (×3): 30 mg via SUBCUTANEOUS
  Filled 2022-04-03 (×3): qty 0.3

## 2022-04-03 MED ORDER — HYDROCODONE-ACETAMINOPHEN 5-325 MG PO TABS
1.0000 | ORAL_TABLET | Freq: Once | ORAL | Status: AC
  Administered 2022-04-03: 1 via ORAL
  Filled 2022-04-03: qty 1

## 2022-04-03 MED ORDER — ONDANSETRON HCL 4 MG/2ML IJ SOLN: 4.0000 mg | Freq: Four times a day (QID) | INTRAMUSCULAR | Status: DC | PRN

## 2022-04-03 MED ORDER — SODIUM CHLORIDE 0.9 % IV SOLN: INTRAVENOUS | Status: DC

## 2022-04-03 MED ORDER — LACTATED RINGERS IV SOLN: INTRAVENOUS | Status: AC

## 2022-04-03 MED ORDER — ACETAMINOPHEN 650 MG RE SUPP: 650.0000 mg | Freq: Four times a day (QID) | RECTAL | Status: DC | PRN

## 2022-04-03 MED ORDER — ACETAMINOPHEN 325 MG PO TABS: 650.0000 mg | ORAL_TABLET | Freq: Four times a day (QID) | ORAL | Status: DC | PRN

## 2022-04-03 MED ORDER — ZOLPIDEM TARTRATE 5 MG PO TABS
5.0000 mg | ORAL_TABLET | Freq: Every day | ORAL | Status: DC
  Administered 2022-04-03 – 2022-04-05 (×4): 5 mg via ORAL
  Filled 2022-04-03 (×4): qty 1

## 2022-04-03 MED ORDER — SODIUM CHLORIDE 0.9 % IV SOLN
500.0000 mg | INTRAVENOUS | Status: DC
  Administered 2022-04-03 – 2022-04-06 (×3): 500 mg via INTRAVENOUS
  Filled 2022-04-03 (×3): qty 5

## 2022-04-03 MED ORDER — ONDANSETRON HCL 4 MG PO TABS: 4.0000 mg | ORAL_TABLET | Freq: Four times a day (QID) | ORAL | Status: DC | PRN

## 2022-04-03 MED ORDER — FLUOXETINE HCL 20 MG PO CAPS
20.0000 mg | ORAL_CAPSULE | Freq: Every day | ORAL | Status: DC
  Administered 2022-04-03 – 2022-04-06 (×4): 20 mg via ORAL
  Filled 2022-04-03 (×4): qty 1

## 2022-04-03 MED ORDER — SODIUM CHLORIDE 0.9 % IV SOLN
1.0000 g | Freq: Once | INTRAVENOUS | Status: AC
  Administered 2022-04-03: 1 g via INTRAVENOUS
  Filled 2022-04-03: qty 10

## 2022-04-03 MED ORDER — SODIUM CHLORIDE 0.9 % IV SOLN
2.0000 g | INTRAVENOUS | Status: DC
  Administered 2022-04-03 – 2022-04-06 (×3): 2 g via INTRAVENOUS
  Filled 2022-04-03 (×3): qty 20

## 2022-04-03 MED ORDER — HYDROCODONE-ACETAMINOPHEN 5-325 MG PO TABS
1.0000 | ORAL_TABLET | Freq: Three times a day (TID) | ORAL | Status: DC | PRN
  Administered 2022-04-03 – 2022-04-04 (×2): 1 via ORAL
  Filled 2022-04-03 (×2): qty 1

## 2022-04-03 MED ORDER — SODIUM CHLORIDE 0.9 % IV SOLN
500.0000 mg | Freq: Once | INTRAVENOUS | Status: AC
  Administered 2022-04-03: 500 mg via INTRAVENOUS
  Filled 2022-04-03: qty 5

## 2022-04-03 NOTE — Progress Notes (Signed)
Report was given by ER nurse. Patient arrived to the unit via stretcher. Patient was able to ambulate from stretcher to bed. Patient alert and oriented x4. Patient oriented to the unit and room. Call light and patient's belongings within reach. Bed in lowest position and side rails x3 up. No further questions from patient at this time. Will continue to monitor patient.

## 2022-04-03 NOTE — Assessment & Plan Note (Signed)
Continue Prozac

## 2022-04-03 NOTE — Evaluation (Signed)
Physical Therapy Evaluation Patient Details Name: Evelyn [name] MRN: [id number] DOB: 1925-05-25 Today's Date: 04/03/2022  History of Present Illness  88 y.o. 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  Clinical Impression  Pt admitted with above diagnosis.  Pt currently with functional limitations due to the deficits listed below (see PT Problem List). Pt will benefit from skilled PT to increase their independence and safety with mobility to allow discharge to the venue listed below.  Pt was able to ambulate 10' with RW in room which was fatigued her.  O2 90% on RA and HR 100. Recommend returning to ALF with PT services.        Recommendations for follow up therapy are one component of a multi-disciplinary discharge planning process, led by the attending physician.  Recommendations may be updated based on patient status, additional functional criteria and insurance authorization.  Follow Up Recommendations Home health PT      Assistance Recommended at Discharge Intermittent Supervision/Assistance  Patient can return home with the following  A little help with walking and/or transfers    Equipment Recommendations None recommended by PT  Recommendations for Other Services       Functional Status Assessment Patient has had a recent decline in their functional status and demonstrates the ability to make significant improvements in function in a reasonable and predictable amount of time.     Precautions / Restrictions Precautions Precautions: Fall Restrictions Weight Bearing Restrictions: No      Mobility  Bed Mobility Overal bed mobility: Modified Independent             General bed mobility comments: HOB elevated     Transfers Overall transfer level: Needs assistance Equipment used: Rolling walker (2 wheels) Transfers: Sit to/from Stand Sit to Stand: Min guard           General transfer comment: slow transition, but min/guard for safety    Ambulation/Gait Ambulation/Gait assistance: Min guard Gait Distance (Feet): 10 Feet Assistive device: Rolling walker (2 wheels) Gait Pattern/deviations: Step-through pattern, Trunk flexed Gait velocity: decreased     General Gait Details: fatigued from short distance gait with o2 90% on RA and HR 100.  Stairs            Wheelchair Mobility    Modified Rankin (Stroke Patients Only)       Balance Overall balance assessment: History of Falls                                           Pertinent Vitals/Pain Pain Assessment Pain Assessment: No/denies pain    Home Living Family/patient expects to be discharged to:: Assisted living                 Home Equipment: Conservation officer, nature (2 wheels)      Prior Function               Mobility Comments: Amb with RW in facility to dining hall ADLs Comments: States they help her with bathing, but she dresses herself.     Hand Dominance        Extremity/Trunk Assessment   Upper Extremity Assessment Upper Extremity Assessment:  Overall [facility] for tasks assessed    Lower Extremity Assessment Lower Extremity Assessment: Generalized weakness    Cervical / Trunk Assessment Cervical / Trunk Assessment: Kyphotic  Communication      Cognition Arousal/Alertness: Awake/alert Behavior During Therapy: WFL for tasks assessed/performed Overall Cognitive Status: Within Functional Limits for tasks assessed                                 General Comments: Some trouble with word finding/dates, but able to tell me about breaking her hip in June and moving from her house to ALF.        General Comments      Exercises     Assessment/Plan    PT  Assessment Patient needs continued PT services  PT Problem List Decreased strength;Decreased activity tolerance;Decreased mobility;Decreased balance;Cardiopulmonary status limiting activity       PT Treatment Interventions DME instruction;Gait training;Functional mobility training;Therapeutic activities;Balance training;Therapeutic exercise    PT Goals (Current goals can be found in the Care Plan section)  Acute Rehab PT Goals Patient Stated Goal: To go back to her apartment. PT Goal Formulation: With patient Time For Goal Achievement: 04/17/22 Potential to Achieve Goals: Good    Frequency Min 3X/week     Co-evaluation               AM-PAC PT "6 Clicks" Mobility  Outcome Measure Help needed turning from your back to your side while in a flat bed without using bedrails?: None Help needed moving from lying on your back to sitting on the side of a flat bed without using bedrails?: None Help needed moving to and from a bed to a chair (including a wheelchair)?: A Little Help needed standing up from a chair using your arms (e.g., wheelchair or bedside chair)?: A Little Help needed to walk in hospital room?: A Little Help needed climbing 3-5 steps with a railing? : A Lot 6 Click Score: 19    End of Session   Activity Tolerance: Patient limited by fatigue Patient left: in chair;with call bell/phone within reach;with chair alarm set Nurse Communication: Mobility status PT Visit Diagnosis: Muscle weakness (generalized) (M62.81);Difficulty in walking, not elsewhere classified (R26.2)    Time: 1345-1411 PT Time Calculation (min) (ACUTE ONLY): 26 min   Charges:   PT Evaluation $PT Eval Moderate Complexity: 1 Mod PT Treatments $Gait Training: 8-22 mins        Tamrah Victorino L. Tamala Julian, PT  04/03/2022   Evelyn [name] 04/03/2022, 2:53 PM

## 2022-04-03 NOTE — Progress Notes (Signed)
PROGRESS NOTE    Evelyn [name]  [id number] DOB: 09-08-1924 DOA: 04/02/2022 PCP: Wardell Honour, MD    Brief Narrative:   Evelyn [name] is a 88 y.o. female with past medical history significant for HTN, CKD stage IIIa, s/p left hip replacement 12/17/2021, recurrent UTI, cognitive impairment, blind in the right eye, hiatal hernia, chronic abdominal and back pain who presented to the ED for evaluation of generalized weakness.  History is supplemented by daughter at bedside. Patient states that she has been residing at [facility] since her left hip replacement on 12/17/2021.  She has been ambulating with the use of a walker.  She has been having progressive functional decline with generalized weakness and is less ambulatory.  She has recurrent UTI and daughter states that she has been essentially on antibiotics for the last 6 months.  She denies any current dysuria after most recent antibiotic course. She is having some increased confusion compared to her baseline which does consist of some memory deficits.  She has occasional cough.  She denies any significant dyspnea, chest pain, nausea, vomiting.  Daughter states that she does appear to have some occasional difficulty with swallowing food.  In the ED, BP 117/56, pulse 93, RR 23, temp 97.4 F, SPO2 97% on room air. WBC 8.4, hemoglobin 11.6, platelets 267,000, sodium 138, potassium 3.6, bicarb 18, BUN 26, creatinine 1.05, serum glucose 127, lipase 33.  Urinalysis negative for UTI. Portable chest x-ray negative for focal consolidation, edema, effusion.  Hiatal hernia noted. CT head without contrast negative for acute intracranial abnormality. CT chest without contrast showed partial posterior lower lobe consolidations which may reflect atelectasis, pneumonia, aspiration.  Mild heterogenous groundglass density in the anterior left upper lobe seen and suspected to be infectious in etiology.  Moderate hiatal hernia again noted. Patient was given  1 L normal saline, IV ceftriaxone and azithromycin, IV Protonix 40 mg.  The hospitalist service was consulted to admit for further evaluation and management.  Assessment & Plan:   Community-acquired pneumonia CT shows partial posterior lower lobe consolidations.  Question of pneumonia or aspiration with likely atelectasis.  Currently saturating well on room air. --Strep pneumo urinary antigen: Pending --Azithromycin 500 mg IV every 24 hours --Ceftriaxone 2 g IV every 24 hours --Incentive spirometer --SLP eval to assess aspiration risk   Debility Progressive generalized weakness, likely age related as well as due to poor oral intake.  Currently resides at [facility]. --PT/OT eval pending   Cognitive impairment Family concern for increased confusion compared to baseline.  She is alert and oriented to self, place, but not year.  Otherwise appropriate. --Delirium precautions --Get up during the day --Encourage a familiar face to remain present throughout the day --Keep blinds open and lights on during daylight hours --Minimize the use of opioids/benzodiazepines   Chronic kidney disease, stage 3a ([place]) Renal function stable, creatinine 0.91   Chronic midline low back pain On chronic Norco 5-325 mg TID, changed to as needed for now.   Depression with somatization Continue Prozac 20 mg p.o. daily.  Diarrhea: C. difficile PCR: Pending   DVT prophylaxis: enoxaparin (LOVENOX) injection 30 mg Start: 04/03/22 2200    Code Status: DNR Family Communication: No family present at bedside this morning  Disposition Plan:  Level of care: Med-Surg Status is: Inpatient Remains inpatient appropriate because: IV antibiotics, anticipate discharge back to ALF in 1-2 days    Consultants:  None  Procedures:  None  Antimicrobials:  Azithromycin 10/13>>  Ceftriaxone 10/13>>  Subjective: Patient seen examined bedside, resting comfortably.  Pleasantly confused.  Remains on room air.   No specific complaints this morning.  Denies headache, no visual changes, no chest pain, no palpitations, no fever/chills, no nausea/vomiting/diarrhea, no shortness of breath, no abdominal pain, no focal weakness, no fatigue, no paresthesias.  No acute events overnight per nursing staff.  Objective: Vitals:   04/03/22 0127 04/03/22 0210 04/03/22 0633 04/03/22 1115  BP:  (!) 146/57 (!) 140/58 (!) 144/49  Pulse:  82 85 78  Resp:  20 20 17   Temp: 97.8 F (36.6 C) (!) 97.5 F (36.4 C) 98.3 F (36.8 C) 97.7 F (36.5 C)  TempSrc: Oral Oral Oral Oral  SpO2:  90% 90%   Weight:      Height:        Intake/Output Summary (Last 24 hours) at 04/03/2022 1203 Last data filed at 04/03/2022 0300 Gross per 24 hour  Intake 1576.79 ml  Output --  Net 1576.79 ml   Filed Weights   04/02/22 1611  Weight: 45.4 kg    Examination:  Physical Exam: GEN: NAD, alert and oriented to place ([place]/Hospital) but not time/person, thin/cachectic/elderly in appearance HEENT: NCAT, PERRL, EOMI, sclera clear, MMM PULM: CTAB w/o wheezes/crackles, normal respiratory effort on room air CV: RRR w/o M/G/R GI: abd soft, NTND, NABS, no R/G/M MSK: no peripheral edema, muscle strength globally intact 5/5 bilateral upper/lower extremities NEURO: CN II-XII intact, no focal deficits, sensation to light touch intact PSYCH: Depressed mood, flat affect Integumentary: No concerning rash/lesions/wounds noted on exposed skin surfaces    Data Reviewed: I have personally reviewed following labs and imaging studies  CBC: Recent Labs  Lab 03/31/22 1444 04/02/22 1758 04/03/22 0508  WBC 11.8* 8.4 6.4  NEUTROABS  --  5.7  --   HGB 11.7* 11.6* 9.5*  HCT 36.5 36.5 29.7*  MCV 91.5 92.2 92.0  PLT 275 267 [id number]   Basic Metabolic Panel: Recent Labs  Lab 03/31/22 1444 04/02/22 1758 04/03/22 0508  NA 136 138 135  K 4.1 3.6 3.5  CL 106 113* 115*  CO2 19* 18* 18*  GLUCOSE 103* 127* 96  BUN 37* 26* 23  CREATININE 1.20*  1.05* 0.91  CALCIUM 10.4* 10.1 8.9  MG 2.0  --   --    GFR: Estimated Creatinine Clearance: 26.5 mL/min (by C-G formula based on SCr of 0.91 mg/dL). Liver Function Tests: Recent Labs  Lab 04/02/22 1758  AST 54*  ALT 31  ALKPHOS 110  BILITOT 0.4  PROT 6.5  ALBUMIN 2.7*   Recent Labs  Lab 04/02/22 1758  LIPASE 33   No results for input(s): "AMMONIA" in the last 168 hours. Coagulation Profile: No results for input(s): "INR", "PROTIME" in the last 168 hours. Cardiac Enzymes: Recent Labs  Lab 03/31/22 1444  CKTOTAL 23*   BNP (last 3 results) No results for input(s): "PROBNP" in the last 8760 hours. HbA1C: No results for input(s): "HGBA1C" in the last 72 hours. CBG: Recent Labs  Lab 03/31/22 1541  GLUCAP 87   Lipid Profile: No results for input(s): "CHOL", "HDL", "LDLCALC", "TRIG", "CHOLHDL", "LDLDIRECT" in the last 72 hours. Thyroid Function Tests: No results for input(s): "TSH", "T4TOTAL", "FREET4", "T3FREE", "THYROIDAB" in the last 72 hours. Anemia Panel: No results for input(s): "VITAMINB12", "FOLATE", "FERRITIN", "TIBC", "IRON", "RETICCTPCT" in the last 72 hours. Sepsis Labs: No results for input(s): "PROCALCITON", "LATICACIDVEN" in the last 168 hours.  Recent Results (from the past 240 hour(s))  SARS Coronavirus  2 by RT PCR (hospital order, performed in [facility] hospital lab) *cepheid single result test* Anterior Nasal Swab     Status: None   Collection Time: 03/31/22  6:11 PM   Specimen: Anterior Nasal Swab  Result Value Ref Range Status   SARS Coronavirus 2 by RT PCR NEGATIVE NEGATIVE Final    Comment: (NOTE) SARS-CoV-2 target nucleic acids are NOT DETECTED.  The SARS-CoV-2 RNA is generally detectable in upper and lower respiratory specimens during the acute phase of infection. The lowest concentration of SARS-CoV-2 viral copies this assay can detect is 250 copies / mL. A negative result does not preclude SARS-CoV-2 infection and should not be used  as the sole basis for treatment or other patient management decisions.  A negative result may occur with improper specimen collection / handling, submission of specimen other than nasopharyngeal swab, presence of viral mutation(s) within the areas targeted by this assay, and inadequate number of viral copies (<250 copies / mL). A negative result must be combined with clinical observations, patient history, and epidemiological information.  Fact Sheet for Patients:   https://www.patel.info/  Fact Sheet for Healthcare Providers: https://hall.com/  This test is not yet approved or  cleared by the [place] FDA and has been authorized for detection and/or diagnosis of SARS-CoV-2 by FDA under an Emergency Use Authorization (EUA).  This EUA will remain in effect (meaning this test can be used) for the duration of the COVID-19 declaration under Section 564(b)(1) of the Act, 21 U.S.C. section 360bbb-3(b)(1), unless the authorization is terminated or revoked sooner.  Performed at [facility], [address] [place], [place] [address]   SARS Coronavirus 2 by RT PCR (hospital order, performed in [facility] hospital lab) *cepheid single result test* Anterior Nasal Swab     Status: None   Collection Time: 04/02/22  5:01 PM   Specimen: Anterior Nasal Swab  Result Value Ref Range Status   SARS Coronavirus 2 by RT PCR NEGATIVE NEGATIVE Final    Comment: (NOTE) SARS-CoV-2 target nucleic acids are NOT DETECTED.  The SARS-CoV-2 RNA is generally detectable in upper and lower respiratory specimens during the acute phase of infection. The lowest concentration of SARS-CoV-2 viral copies this assay can detect is 250 copies / mL. A negative result does not preclude SARS-CoV-2 infection and should not be used as the sole basis for treatment or other patient management decisions.  A negative result may occur with improper specimen  collection / handling, submission of specimen other than nasopharyngeal swab, presence of viral mutation(s) within the areas targeted by this assay, and inadequate number of viral copies (<250 copies / mL). A negative result must be combined with clinical observations, patient history, and epidemiological information.  Fact Sheet for Patients:   https://www.patel.info/  Fact Sheet for Healthcare Providers: https://hall.com/  This test is not yet approved or  cleared by the [place] FDA and has been authorized for detection and/or diagnosis of SARS-CoV-2 by FDA under an Emergency Use Authorization (EUA).  This EUA will remain in effect (meaning this test can be used) for the duration of the COVID-19 declaration under Section 564(b)(1) of the Act, 21 U.S.C. section 360bbb-3(b)(1), unless the authorization is terminated or revoked sooner.  Performed at [facility], [place] [address], [place], [place] [address]          Radiology Studies: CT Chest Wo Contrast  Result Date: 04/02/2022 CLINICAL DATA:  Progressive weakness back pain EXAM: CT CHEST WITHOUT CONTRAST TECHNIQUE: Multidetector  CT imaging of the chest was performed following the standard protocol without IV contrast. RADIATION DOSE REDUCTION: This exam was performed according to the departmental dose-optimization program which includes automated exposure control, adjustment of the mA and/or kV according to patient size and/or use of iterative reconstruction technique. COMPARISON:  Chest x-ray 04/02/2022, radiograph 12/17/2021, CT 04/02/2022, 11/05/2021 FINDINGS: Cardiovascular: Limited evaluation without intravenous contrast. Moderate aortic atherosclerosis. No aneurysm. Coronary vascular calcification. Normal cardiac size. No pericardial effusion Mediastinum/Nodes: Midline trachea. No thyroid mass. No suspicious lymph nodes. Moderate large hiatal hernia with  thickened appearing stomach in the hernia sac. Lungs/Pleura: No pleural effusion or pneumothorax. Mild diffuse septal thickening. Partial lower lobe consolidations with mild air bronchograms. Mild ground-glass densities within the left upper lobe anteriorly. Minimal bronchiectasis in the right lower lobe. Mild subpleural reticulation. Upper Abdomen: No acute finding. Musculoskeletal: Old sternal manubrial fracture. Chronic moderate compression fracture T12. Multiple old left-sided rib fractures. IMPRESSION: 1. Partial posterior lower lobe consolidations, this may reflect atelectasis, pneumonia or aspiration. Mild heterogeneous ground-glass density in the anterior left upper lobe is suspected to be infectious in etiology. 2. Moderate hiatal hernia Aortic Atherosclerosis (ICD10-I70.0). Electronically Signed   By: Donavan Foil M.D.   On: 04/02/2022 23:50   CT Head Wo Contrast  Result Date: 04/02/2022 CLINICAL DATA:  Mental status change, unknown cause. Progressive weakness. EXAM: CT HEAD WITHOUT CONTRAST TECHNIQUE: Contiguous axial images were obtained from the base of the skull through the vertex without intravenous contrast. RADIATION DOSE REDUCTION: This exam was performed according to the departmental dose-optimization program which includes automated exposure control, adjustment of the mA and/or kV according to patient size and/or use of iterative reconstruction technique. COMPARISON:  11/08/2021. FINDINGS: Brain: No acute intracranial hemorrhage, midline shift or mass effect. No extra-axial fluid collection. Diffuse atrophy is noted. Periventricular white matter hypodensities are present bilaterally. No hydrocephalus. There is chronic asymmetry of the lateral ventricles with encephalomalacia of the caudate and/or external capsule on the right. Vascular: No hyperdense vessel or unexpected calcification. Skull: Normal. Negative for fracture or focal lesion. Sinuses/Orbits: No acute finding. Other: None.  IMPRESSION: Stable head CT with no acute intracranial abnormality. Electronically Signed   By: Brett Fairy M.D.   On: 04/02/2022 23:46   DG Chest Port 1 View  Result Date: 04/02/2022 CLINICAL DATA:  Weakness EXAM: PORTABLE CHEST 1 VIEW COMPARISON:  12/20/2021 FINDINGS: No acute airspace disease. No pleural effusion. Stable cardiomediastinal silhouette with aortic atherosclerosis and hiatal hernia. No pneumothorax. Multiple left-sided rib fractures which are new compared to prior radiograph but appear subacute to chronic. IMPRESSION: No active disease.  Hiatal hernia Electronically Signed   By: Donavan Foil M.D.   On: 04/02/2022 22:54   CT Renal Stone Study  Result Date: 04/02/2022 CLINICAL DATA:  Flank pain EXAM: CT ABDOMEN AND PELVIS WITHOUT CONTRAST TECHNIQUE: Multidetector CT imaging of the abdomen and pelvis was performed following the standard protocol without IV contrast. RADIATION DOSE REDUCTION: This exam was performed according to the departmental dose-optimization program which includes automated exposure control, adjustment of the mA and/or kV according to patient size and/or use of iterative reconstruction technique. COMPARISON:  11/05/2021 FINDINGS: Lower chest: Coronary artery calcifications are seen. There is large fixed hiatal hernia. There are new patchy infiltrates in posterior lower lung fields on both sides. Left hemidiaphragm is elevated. Small bilateral pleural effusions are seen. Hepatobiliary: There is 12 mm low-density in the right lobe with no significant change, possibly cysts or hemangiomas. There is no dilation of bile  ducts. Gallbladder is not distended. Pancreas: No focal abnormalities are seen. Spleen: Coarse calcification is seen in the spleen. Spleen is not enlarged. Adrenals/Urinary Tract: Adrenals are unremarkable. There is no hydronephrosis. There are bilateral renal cysts largest in the upper pole of left kidney measuring 5.3 cm. There are no renal or ureteral  stones. Evaluation of bladder is limited by beam hardening artifacts. Stomach/Bowel: Large fixed hiatal hernia is seen. Stomach is unremarkable. Small bowel loops are not dilated. Appendix is not seen. There is no pericecal inflammation. Vascular/Lymphatic: Extensive arterial calcifications are seen. Reproductive: Uterus is not seen. Beam hardening artifacts caused by surgical hardware in left hip limit evaluation of lower pelvis. Other: There is no ascites or pneumoperitoneum. Musculoskeletal: There is left hip arthroplasty. Degenerative changes are noted in lumbar spine. There is decrease in height of bodies of T12 and L3 vertebrae with no significant interval change. There is S shaped scoliosis in lumbar spine. IMPRESSION: There is no evidence of intestinal obstruction or pneumoperitoneum. There is no hydronephrosis. Coronary artery disease. Large fixed hiatal hernia. There are new patchy infiltrates in the lower lung fields, more so on the right side suggesting atelectasis/pneumonia. Minimal bilateral pleural effusions are seen. There is decrease in height of bodies of T12 and L3 vertebrae suggesting old compression fractures. Other findings as described in the body of the report. Electronically Signed   By: Elmer Picker M.D.   On: 04/02/2022 17:45        Scheduled Meds:  enoxaparin (LOVENOX) injection  30 mg Subcutaneous Q24H   FLUoxetine  20 mg Oral Daily   zolpidem  5 mg Oral QHS   Continuous Infusions:  sodium chloride 100 mL/hr at 04/03/22 0110   [START ON 04/04/2022] azithromycin     [START ON 04/04/2022] cefTRIAXone (ROCEPHIN)  IV     lactated ringers 100 mL/hr at 04/03/22 0234     LOS: 0 days    Time spent: 51 minutes spent on chart review, discussion with nursing staff, consultants, updating family and interview/physical exam; more than 50% of that time was spent in counseling and/or coordination of care.    Emerly Prak J [place] DO [facility] Available via Epic  secure chat 7am-7pm After these hours, please refer to coverage provider listed on [email or web address] 04/03/2022, 12:03 PM

## 2022-04-03 NOTE — Assessment & Plan Note (Signed)
On chronic Norco 5-325 mg TID, changed to as needed for now.

## 2022-04-03 NOTE — Assessment & Plan Note (Signed)
Renal function stable.

## 2022-04-03 NOTE — Hospital Course (Signed)
Evelyn [name] is a 88 y.o. female with medical history significant for HTN, CKD stage IIIa, s/p left hip replacement 12/17/2021, recurrent UTI, cognitive impairment, blind in the right eye, hiatal hernia, chronic abdominal and back pain who is admitted with lower lobe pneumonia.

## 2022-04-03 NOTE — H&P (Signed)
History and Physical    Evelyn [name] [id number] DOB: 1924/09/06 DOA: 04/02/2022  PCP: Wardell Honour, MD  Patient coming from: [facility]  I have personally briefly reviewed patient's old medical records in [place]  Chief Complaint: Generalized weakness  HPI: NAKIETA ADVINCULA is a 88 y.o. female with medical history significant for HTN, CKD stage IIIa, s/p left hip replacement 12/17/2021, recurrent UTI, cognitive impairment, blind in the right eye, hiatal hernia, chronic abdominal and back pain who presented to the ED for evaluation of generalized weakness.  History is supplemented by daughter at bedside.  Patient states that she has been residing at [facility] since her left hip replacement on 12/17/2021.  She has been ambulating with the use of a walker.  She has been having progressive functional decline with generalized weakness and is less ambulatory.  She has recurrent UTI and daughter states that she has been essentially on antibiotics for the last 6 months.  She denies any current dysuria after most recent antibiotic course.  She is having some increased confusion compared to her baseline which does consist of some memory deficits.  She has occasional cough.  She denies any significant dyspnea, chest pain, nausea, vomiting.  Daughter states that she does appear to have some occasional difficulty with swallowing food.  ED Course  Labs/Imaging on admission: I have personally reviewed following labs and imaging studies.  Initial vitals showed BP 117/56, pulse 93, RR 23, temp 97.4 F, SPO2 97% on room air.  Labs show WBC 8.4, hemoglobin 11.6, platelets 267,000, sodium 138, potassium 3.6, bicarb 18, BUN 26, creatinine 1.05, serum glucose 127, lipase 33.  Urinalysis negative for UTI.  Portable chest x-ray negative for focal consolidation, edema, effusion.  Hiatal hernia noted.  CT head without contrast negative for acute intracranial abnormality.  CT chest  without contrast showed partial posterior lower lobe consolidations which may reflect atelectasis, pneumonia, aspiration.  Mild heterogenous groundglass density in the anterior left upper lobe seen and suspected to be infectious in etiology.  Moderate hiatal hernia again noted.  Patient was given 1 L normal saline, IV ceftriaxone and azithromycin, IV Protonix 40 mg.  The hospitalist service was consulted to admit for further evaluation and management.  Review of Systems: All systems reviewed and are negative except as documented in history of present illness above.   Past Medical History:  Diagnosis Date   Allergy    Arthritis    Chronic cystitis    Congenital blindness    right eye   Depression    Gastric ulcer    H/O: hematuria    Hepatitis, unspecified    statin related, resolved quickly   Hyperlipidemia    Legally blind in right eye, as defined in [place]    Syncope and collapse 04/01/2014   UTI (lower urinary tract infection)    HISTORY OF UTI    Past Surgical History:  Procedure Laterality Date   ABDOMINAL HYSTERECTOMY     vaginal   BLADDER SUSPENSION     A-P   CATARACT EXTRACTION W/ INTRAOCULAR LENS IMPLANT Left    ESOPHAGOGASTRODUODENOSCOPY     HIP ARTHROPLASTY Left 12/17/2021   Procedure: ARTHROPLASTY BIPOLAR HIP (HEMIARTHROPLASTY);  Surgeon: Marybelle Killings, MD;  Location: [facility];  Service: Orthopedics;  Laterality: Left;   OPEN REDUCTION INTERNAL FIXATION (ORIF) DISTAL RADIAL FRACTURE Right 03/09/2016   Procedure: OPEN REDUCTION INTERNAL FIXATION (ORIF) DISTAL RADIAL FRACTURE;  Surgeon: Leanora Cover, MD;  Location: [facility];  Service: Orthopedics;  Laterality: Right;    Social History:  reports that she has never smoked. She has never used smokeless tobacco. She reports that she does not drink alcohol and does not use drugs.  No Known Allergies  Family History  Problem Relation Age of Onset   Cancer Neg Hx        Colon cancer   Stroke Neg Hx     Heart disease Neg Hx    Hyperlipidemia Neg Hx    Hypertension Neg Hx    Kidney disease Neg Hx    Colon polyps Neg Hx    Colon cancer Neg Hx      Prior to Admission medications   Medication Sig Start Date End Date Taking? Authorizing Provider  aspirin EC 325 MG tablet Take 1 tablet (325 mg total) by mouth daily with breakfast. MUST TAKE AT LEAST 4 WEEKS POSTOP FOR DVT PROPHYLAXIS 12/19/21   Lanae Crumbly, PA-C  cephALEXin (KEFLEX) 500 MG capsule Take 1 capsule (500 mg total) by mouth 3 (three) times daily. 03/31/22   Varney Biles, MD  cholecalciferol (VITAMIN D3) 25 MCG (1000 UNIT) tablet Take 1,000 Units by mouth daily.    [provider]  docusate sodium (COLACE) 100 MG capsule Take 100 mg by mouth in the morning.    [provider]  FLUoxetine (PROZAC) 20 MG capsule Take 20 mg by mouth daily.    [provider]  furosemide (LASIX) 40 MG tablet Take 40 mg by mouth daily. 11/23/21   [provider]  hydrochlorothiazide (MICROZIDE) 12.5 MG capsule Take 12.5 mg by mouth daily. 12/09/21   [provider]  HYDROcodone-acetaminophen (NORCO/VICODIN) 5-325 MG tablet Take 1 tablet by mouth every 6 (six) hours as needed for moderate pain. 12/18/21   Lanae Crumbly, PA-C  mirtazapine (REMERON) 15 MG tablet Take 15 mg by mouth at bedtime. 01/11/22   [provider]  Multiple Vitamin (MULTIVITAMIN WITH MINERALS) TABS tablet Take 1 tablet by mouth daily. 12/21/21   Antonieta Pert, MD  nitrofurantoin, macrocrystal-monohydrate, (MACROBID) 100 MG capsule Take 100 mg by mouth 2 (two) times daily. 03/19/22   [provider]  oxybutynin 2.5 MG TABS Take 2.5 mg by mouth 3 (three) times daily. 11/10/21   Caren Griffins, MD  pantoprazole (PROTONIX) 40 MG tablet Take 1 tablet (40 mg total) by mouth daily before breakfast. 11/10/21   Gherghe, Vella Redhead, MD  polyethylene glycol (MIRALAX / GLYCOLAX) 17 g packet Take 17 g by mouth daily.    [provider]   zolpidem (AMBIEN) 10 MG tablet Take 1/2 tablet by mouth once daily at bedtime as needed for sleep. Patient taking differently: Take 5 mg by mouth at bedtime. 07/01/21   Wardell Honour, MD    Physical Exam: Vitals:   04/02/22 2230 04/03/22 0030 04/03/22 0100 04/03/22 0127  BP: 130/60 (!) 149/65 (!) 146/61   Pulse: 83 87 88   Resp: (!) 21 20 19    Temp:    97.8 F (36.6 C)  TempSrc:    Oral  SpO2: 98% 90% 97%   Weight:      Height:       Constitutional: Thin elderly woman resting in bed, NAD, calm, comfortable Eyes: EOMI, lids and conjunctivae normal ENMT: Mucous membranes are dry. Posterior pharynx clear of any exudate or lesions.Normal dentition.  Neck: normal, supple, no masses. Respiratory: clear to auscultation bilaterally, no wheezing, no crackles. Normal respiratory effort. No accessory muscle  use.  Cardiovascular: Regular rate and rhythm, no murmurs / rubs / gallops. No extremity edema. 2+ pedal pulses. Abdomen: no tenderness, no masses palpated. Musculoskeletal: no clubbing / cyanosis. No joint deformity upper and lower extremities. Good ROM, no contractures. Normal muscle tone.  Skin: no rashes, lesions, ulcers. No induration Neurologic: Sensation intact. Strength 5/5 in all 4.  Psychiatric: Awake, alert.  Oriented to self, knows she is in [place] and in the hospital.  Tells me the year is 2022.  EKG: Personally reviewed. Sinus tachycardia, rate 103, no acute ischemic changes.  Rate is faster when compared to prior.  Assessment/Plan Principal Problem:   Lower lobe pneumonia Active Problems:   Debility   Depression with somatization   Chronic midline low back pain   Chronic kidney disease, stage 3a ([place])   Cognitive impairment   ARTHELIA PUREWAL is a 88 y.o. female with medical history significant for HTN, CKD stage IIIa, s/p left hip replacement 12/17/2021, recurrent UTI, cognitive impairment, blind in the right eye, hiatal hernia, chronic abdominal and back pain  who is admitted with lower lobe pneumonia.  Assessment and Plan: * Lower lobe pneumonia CT shows partial posterior lower lobe consolidations.  Question of pneumonia or aspiration with likely atelectasis.  Currently saturating well on room air. -Continue empiric IV ceftriaxone and azithromycin -Incentive spirometer -Supplemental oxygen as needed -SLP eval  Debility Progressive generalized weakness, likely age related as well as due to poor oral intake. -PT/OT eval  Cognitive impairment Family concern for increased confusion compared to baseline.  She is alert and oriented to self, place, but not year.  Otherwise appropriate.  Chronic kidney disease, stage 3a ([place]) Renal function stable.  Chronic midline low back pain On chronic Norco 5-325 mg TID, changed to as needed for now.  Depression with somatization Continue Prozac.  DVT prophylaxis: enoxaparin (LOVENOX) injection 30 mg Start: 04/03/22 2200 Code Status: DNR, confirmed on admission Family Communication: Daughter at bedside Disposition Plan: From ALF, dispo pending clinical progress Consults called: None Severity of Illness: The appropriate patient status for this patient is OBSERVATION. Observation status is judged to be reasonable and necessary in order to provide the required intensity of service to ensure the patient's safety. The patient's presenting symptoms, physical exam findings, and initial radiographic and laboratory data in the context of their medical condition is felt to place them at decreased risk for further clinical deterioration. Furthermore, it is anticipated that the patient will be medically stable for discharge from the hospital within 2 midnights of admission.   Zada Finders MD [facility]  If 7PM-7AM, please contact night-coverage [email or web address]  04/03/2022, 1:35 AM

## 2022-04-03 NOTE — Evaluation (Signed)
Occupational Therapy Evaluation Patient Details Name: Evelyn [name] MRN: [id number] DOB: 08-02-1924 Today's Date: 04/03/2022   History of Present Illness 88 y.o. 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   Clinical Impression   Patient is a 88 year old female who was admitted for above. Patient was recently moved to [place]. Patient has PRN support from daughter as well. Patient was mod A  for Lb dressing and min guard for transfers with consistent cues for sequencing. Patient was noted to have decreased functional activity tolerance, decreased endurance, decreased standing balance, increased confusion, decreased safety awareness, and decreased knowledge of AD/AE impacting participation in ADLs. Patient would continue to benefit from skilled OT services at this time while admitted and after d/c to address noted deficits in order to improve overall safety and independence in ADLs.         Recommendations for follow up therapy are one component of a multi-disciplinary discharge planning process, led by the attending physician.  Recommendations may be updated based on patient status, additional functional criteria and insurance authorization.   Follow Up Recommendations  Home health OT    Assistance Recommended at Discharge Frequent or constant Supervision/Assistance  Patient can return home with the following A little help with walking and/or transfers;A little help with bathing/dressing/bathroom;Direct supervision/assist for financial management;Assist for transportation;Direct supervision/assist for medications management;Help with stairs or ramp for entrance;Assistance with cooking/housework    Functional Status  Assessment  Patient has had a recent decline in their functional status and demonstrates the ability to make significant improvements in function in a reasonable and predictable amount of time.  Equipment Recommendations  None recommended by OT    Recommendations for Other Services       Precautions / Restrictions Precautions Precautions: Fall Restrictions Weight Bearing Restrictions: No      Mobility Bed Mobility               General bed mobility comments: patient was in recliner and returned to the same    Transfers                          Balance Overall balance assessment: History of Falls                                         ADL either performed or assessed with clinical judgement   ADL Overall ADL's : Needs assistance/impaired     Grooming: Min guard;Set up;Standing   Upper Body Bathing: Min guard;Standing   Lower Body Bathing: Sit to/from stand;Sitting/lateral leans;Minimal assistance   Upper Body Dressing : Set up;Minimal assistance;Sitting Upper Body Dressing Details (indicate cue type and reason): patient was confused with large size of hospital gown with education on how to don/doff properly with patient taking off gown x3 during session without meaning to. Lower Body Dressing: Moderate assistance;Sit to/from stand Lower Body Dressing Details (indicate cue type and reason): to don adult abdorbent undergarments with increased time and cues. noted confusion. Toilet Transfer: Min guard;Ambulation;Rolling walker (2 wheels) Toilet Transfer Details (indicate cue type and reason): with cues for safety and sequencing of sitting down on commode  with patientpreparting to sit next to commode instead of on it Toileting- Water quality scientist and Hygiene: Moderate assistance;Sit to/from stand Toileting - Clothing Manipulation Details (indicate cue type and reason): to pull up undergarments.             Vision Baseline  Vision/History: 1 Wears glasses Patient Visual Report: No change from baseline       Perception     Praxis      Pertinent Vitals/Pain Pain Assessment Pain Assessment: Faces Faces Pain Scale: Hurts a little bit Pain Location: stomach Pain Descriptors / Indicators: Discomfort Pain Intervention(s): Monitored during session     Hand Dominance Right   Extremity/Trunk Assessment Upper Extremity Assessment Upper Extremity Assessment: Overall WFL for tasks assessed   Lower Extremity Assessment Lower Extremity Assessment: Generalized weakness   Cervical / Trunk Assessment Cervical / Trunk Assessment: Kyphotic   Communication Communication Communication: No difficulties   Cognition                                             General Comments       Exercises     Shoulder Instructions      Home Living Family/patient expects to be discharged to:: Assisted living                             Home Equipment: Rolling Walker (2 wheels)          Prior Functioning/Environment Prior Level of Function : Independent/Modified Independent             Mobility Comments: Amb with RW in facility to dining hall ADLs Comments: States they help her with bathing, but she dresses herself.        OT Problem List: Decreased activity tolerance;Impaired balance (sitting and/or standing);Decreased safety awareness;Decreased knowledge of precautions;Decreased knowledge of use of DME or AE      OT Treatment/Interventions: Self-care/ADL training;Therapeutic exercise;Neuromuscular education;Energy conservation;DME and/or AE instruction;Therapeutic activities;Balance training;Patient/family education    OT Goals(Current goals can be found in the care plan section) Acute Rehab OT Goals Patient Stated Goal: to go home OT Goal Formulation: With patient/family Time For Goal Achievement: 04/17/22 Potential to Achieve Goals: Fair  OT Frequency: Min 2X/week     Co-evaluation              AM-PAC OT "6 Clicks" Daily Activity     Outcome Measure Help from another person eating meals?: A Little Help from another person taking care of personal grooming?: A Little Help from another person toileting, which includes using toliet, bedpan, or urinal?: A Little Help from another person bathing (including washing, rinsing, drying)?: A Little Help from another person to put on and taking off regular upper body clothing?: A Little Help from another person to put on and taking off regular lower body clothing?: A Little 6 Click Score: 18   End of Session Equipment Utilized During Treatment: Rolling walker (2 wheels) Nurse Communication: Mobility status;Other (comment) (need to use bathroom v.s. pure wic)  Activity Tolerance: Patient tolerated treatment well Patient left: in chair;with call bell/phone within reach;with family/visitor present;with nursing/sitter in room  OT Visit Diagnosis: Unsteadiness on feet (R26.81);Other abnormalities of gait and mobility (R26.89);Muscle weakness (generalized) (M62.81);Other symptoms and signs involving cognitive function  Time: 2902-1115 OT Time Calculation (min): 26 min Charges:  OT General Charges $OT Visit: 1 Visit OT Evaluation $OT Eval Low Complexity: 1 Low OT Treatments $Self Care/Home Management : 8-22 mins  Evelyn Plowman, MS Acute Rehabilitation Department Office# [phone]   Evelyn [name] 04/03/2022, 3:40 PM

## 2022-04-03 NOTE — Assessment & Plan Note (Signed)
CT shows partial posterior lower lobe consolidations.  Question of pneumonia or aspiration with likely atelectasis.  Currently saturating well on room air. -Continue empiric IV ceftriaxone and azithromycin -Incentive spirometer -Supplemental oxygen as needed -SLP eval

## 2022-04-03 NOTE — Assessment & Plan Note (Signed)
Family concern for increased confusion compared to baseline.  She is alert and oriented to self, place, but not year.  Otherwise appropriate.

## 2022-04-03 NOTE — Evaluation (Signed)
Clinical/Bedside Swallow Evaluation Patient Details  Name: Evelyn [name] MRN: [id number] Date of Birth: Mar 15, 1925  Today's Date: 04/03/2022 Time: SLP Start Time (ACUTE ONLY): 72 SLP Stop Time (ACUTE ONLY): [id number] SLP Time Calculation (min) (ACUTE ONLY): 22 min  Past Medical History:  Past Medical History:  Diagnosis Date   Allergy    Arthritis    Chronic cystitis    Congenital blindness    right eye   Depression    Gastric ulcer    H/O: hematuria    Hepatitis, unspecified    statin related, resolved quickly   Hyperlipidemia    Legally blind in right eye, as defined in [place]    Syncope and collapse 04/01/2014   UTI (lower urinary tract infection)    HISTORY OF UTI   Past Surgical History:  Past Surgical History:  Procedure Laterality Date   ABDOMINAL HYSTERECTOMY     vaginal   BLADDER SUSPENSION     A-P   CATARACT EXTRACTION W/ INTRAOCULAR LENS IMPLANT Left    ESOPHAGOGASTRODUODENOSCOPY     HIP ARTHROPLASTY Left 12/17/2021   Procedure: ARTHROPLASTY BIPOLAR HIP (HEMIARTHROPLASTY);  Surgeon: Evelyn Killings, MD;  Location: [facility];  Service: Orthopedics;  Laterality: Left;   OPEN REDUCTION INTERNAL FIXATION (ORIF) DISTAL RADIAL FRACTURE Right 03/09/2016   Procedure: OPEN REDUCTION INTERNAL FIXATION (ORIF) DISTAL RADIAL FRACTURE;  Surgeon: Evelyn Cover, MD;  Location: [facility];  Service: Orthopedics;  Laterality: Right;   HPI:  88 y.o. 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    Assessment / Plan / Recommendation  Clinical Impression  Evelyn [name] denies dysphagia overall and was seen during lunch meal with tea, chicken breast, cooked carrots, and mashed potatoes. Pt consumed all consistencies without  difficulty. All oral structures appeared functional for intake. She had no s/s aspiration with any consistency. Current hospitalization states it is for lower lobe PNA however imaging reports that lower lobe is atelectasis versus PNA and upper lobe is consistent with infection. Family reports h/o difficulty swallowing solids, which pt describes as a feeling of foods getting stuck substernally (consistent with her dx of hiatal hernia/esophageal dysphagia). Pt passed the [facility] Protocol 3 oz challenge by consuming 3 oz thin liquid without stopping with no s/s aspiration, making her at low risk for (prandial) aspiration. Should concerns persist over aspiration, suspect it would be post-prandial in nature and likely best assessed with esophagram given hernia.  No further ST indicated; pt in agreement with plan. Continue regular solids, thin liquids, meds as tolerated.    SLP Visit Diagnosis: Dysphagia, unspecified (R13.10)    Aspiration Risk  Mild aspiration risk    Diet Recommendation Regular;Thin liquid   Liquid Administration via: Cup;Straw Medication Administration: Whole meds with liquid Supervision: Patient able to self feed Compensations: Small sips/bites;Slow rate Postural Changes: Seated upright at 90 degrees;Remain upright for at least 30 minutes after po intake    Other  Recommendations Oral Care Recommendations: Oral care BID    Recommendations for follow up therapy are one component of a multi-disciplinary discharge planning process, led by the attending physician.  Recommendations may be updated based on patient status, additional functional criteria and insurance authorization.  Follow up Recommendations No SLP follow up  Swallow Study   General Date of Onset: 04/03/22 HPI: 88 y.o. female with past medical history significant for HTN, CKD stage IIIa, s/p left hip replacement 12/17/2021, recurrent UTI, cognitive impairment, blind in the right eye, hiatal  hernia, chronic abdominal and back pain who presented to the ED for evaluation of generalized weakness and found to have pneumonia. Type of Study: Bedside Swallow Evaluation Previous Swallow Assessment: none Diet Prior to this Study: Regular;Thin liquids Temperature Spikes Noted: No Respiratory Status: Room air History of Recent Intubation: No Behavior/Cognition: Alert;Cooperative;Pleasant mood Oral Cavity Assessment: Within Functional Limits Oral Care Completed by SLP: Other (Comment) (eating lunch) Oral Cavity - Dentition: Adequate natural dentition Vision: Functional for self-feeding Self-Feeding Abilities: Able to feed self Patient Positioning: Upright in chair Baseline Vocal Quality: Normal Volitional Cough: Strong Volitional Swallow: Able to elicit    Oral/Motor/Sensory Function Overall Oral Motor/Sensory Function: Within functional limits   Ice Chips     Thin Liquid Thin Liquid: Within functional limits Presentation: Cup;Straw    Puree Puree: Within functional limits Presentation: Self Fed   Solid     Solid: Within functional limits Presentation: Evelyn [name] P. Evelyn [name], M.S., [place] Pathologist Acute Rehabilitation Services Pager: [place] 04/03/2022,3:12 PM

## 2022-04-03 NOTE — Assessment & Plan Note (Signed)
Progressive generalized weakness, likely age related as well as due to poor oral intake. -PT/OT eval

## 2022-04-04 DIAGNOSIS — J189 Pneumonia, unspecified organism: Secondary | ICD-10-CM | POA: Diagnosis not present

## 2022-04-04 DIAGNOSIS — R4189 Other symptoms and signs involving cognitive functions and awareness: Secondary | ICD-10-CM | POA: Diagnosis not present

## 2022-04-04 DIAGNOSIS — M5440 Lumbago with sciatica, unspecified side: Secondary | ICD-10-CM | POA: Diagnosis not present

## 2022-04-04 DIAGNOSIS — N1831 Chronic kidney disease, stage 3a: Secondary | ICD-10-CM | POA: Diagnosis not present

## 2022-04-04 LAB — BASIC METABOLIC PANEL
Anion gap: 3 — ABNORMAL LOW (ref 5–15)
BUN: 18 mg/dL (ref 8–23)
CO2: 18 mmol/L — ABNORMAL LOW (ref 22–32)
Calcium: 8.9 mg/dL (ref 8.9–10.3)
Chloride: 118 mmol/L — ABNORMAL HIGH (ref 98–111)
Creatinine, Ser: 0.68 mg/dL (ref 0.44–1.00)
GFR, Estimated: >60 mL/min (ref >60)
Glucose, Bld: 88 mg/dL (ref 70–99)
Potassium: 3.5 mmol/L (ref 3.5–5.1)
Sodium: 139 mmol/L (ref 135–145)

## 2022-04-04 LAB — MAGNESIUM: Magnesium: 1.8 mg/dL (ref 1.7–2.4)

## 2022-04-04 LAB — PHOSPHORUS: Phosphorus: 2.4 mg/dL — ABNORMAL LOW (ref 2.5–4.6)

## 2022-04-04 MED ORDER — MAGNESIUM SULFATE 2 GM/50ML IV SOLN
2.0000 g | Freq: Once | INTRAVENOUS | Status: AC
  Administered 2022-04-04: 2 g via INTRAVENOUS
  Filled 2022-04-04: qty 50

## 2022-04-04 MED ORDER — ENSURE ENLIVE PO LIQD
237.0000 mL | Freq: Two times a day (BID) | ORAL | Status: DC
  Administered 2022-04-04: 237 mL via ORAL

## 2022-04-04 MED ORDER — K PHOS MONO-SOD PHOS DI & MONO 155-852-130 MG PO TABS
500.0000 mg | ORAL_TABLET | Freq: Once | ORAL | Status: AC
  Administered 2022-04-04: 500 mg via ORAL
  Filled 2022-04-04: qty 2

## 2022-04-04 MED ORDER — POTASSIUM CHLORIDE CRYS ER 20 MEQ PO TBCR
40.0000 meq | EXTENDED_RELEASE_TABLET | Freq: Once | ORAL | Status: AC
  Administered 2022-04-04: 40 meq via ORAL
  Filled 2022-04-04: qty 2

## 2022-04-04 NOTE — Progress Notes (Signed)
Mobility Specialist - Progress Note    04/04/22 1149  Mobility  Activity Ambulated with assistance in room;Ambulated with assistance in hallway  Level of Assistance Contact guard assist, steadying assist  Assistive Device Front wheel walker  Distance Ambulated (ft) 55 ft  Activity Response Tolerated well  $Mobility charge 1 Mobility   Pt received in bed and was agreeable to mobilize this morning. No A needed at bed level this day, once up pt required steadying assist to ambulate and to steer RW. No complaints made during activity, pt returned to bed at EOS and left with bed alarm on and call bell within reach.   [place] Specialist Acute Rehabilitation Services Phone: [phone] 04/04/22, 11:54 AM

## 2022-04-04 NOTE — TOC Initial Note (Addendum)
Transition of Care [facility]) - Initial/Assessment Note    Patient Details  Name: Evelyn [name] MRN: [id number] Date of Birth: 1925/02/11  Transition of [facility]) CM/SW Contact:    Vassie Moselle, LCSW Phone Number: 04/04/2022, 1:43 PM  Clinical Narrative:                 Met with pt and daughter at bedside and confirmed pt is a resident at [facility]. Pt currently receives in house PT/OT. CSW discussed recommendation for home health PT/OT for additional support/services when she returns. Pt's daughter was upset during conversation that pt had no monitors on and was not receiving any form of IV fluid/medication. She states her brother who lives out of State wants to speak to MD. MD notified.  CSW contacted [facility] and confirmed pt is a resident at their facility. CSW was told that they typically use [facility] and [facility] for home health services for their residents. HHPT/OT has been arranged with [facility].   FL-2 has been faxed to [facility] for review.   Expected Discharge Plan: Assisted Living Barriers to Discharge: Continued Medical Work up   Patient Goals and CMS Choice Patient states their goals for this hospitalization and ongoing recovery are:: Per daughter to return to [place] [employer] Compare Post Acute Care list provided to:: Patient Represenative (must comment) (Daughter) Choice offered to / list presented to : Adult Children, Patient  Expected Discharge Plan and Services Expected Discharge Plan: Assisted Living In-house Referral: NA Discharge Planning Services: CM Consult Post Acute Care Choice: Home Health, Resumption of Svcs/PTA Provider Living arrangements for the past 2 months: Assisted Living Facility                 DME Arranged: N/A DME Agency: NA       HH Arranged: PT, OT HH Agency: [facility] Date [place]: 04/04/22 Time [place]: [id number] Representative spoke with at [place]: [place]  Arrangements/Services Living arrangements for the past 2 months: [place] Lives with:: Facility Resident Patient language and need for interpreter reviewed:: Yes Do you feel safe going back to the place where you live?: Yes      Need for Family Participation in Patient Care: Yes (Comment) Care giver support system in place?: Yes (comment) Current home services: DME, Home PT, Home OT Criminal Activity/Legal Involvement Pertinent to Current Situation/Hospitalization: No - Comment as needed  Activities of Daily Living Home Assistive Devices/Equipment: Environmental consultant (specify type) ADL Screening (condition at time of admission) Patient's cognitive ability adequate to safely complete daily activities?: Yes Is the patient deaf or have difficulty hearing?: No Does the patient have difficulty seeing, even when wearing glasses/contacts?: Yes Does the patient have difficulty concentrating, remembering, or making decisions?: No Patient able to express need for assistance with ADLs?: Yes Does the patient have difficulty dressing or bathing?: No Independently performs ADLs?: Yes (appropriate for developmental age) Does the patient have difficulty walking or climbing stairs?: Yes Weakness of Legs: Both Weakness of Arms/Hands: Both  Permission Sought/Granted Permission sought to share information with : Facility Sport and exercise psychologist, Family Supports Permission granted to share information with : Yes, Verbal Permission Granted  Share Information with NAME: Lattie Haw Hipp  Permission granted to share info w AGENCY: [place] granted to share info w Relationship: Daughter  Permission granted to share info w Contact Information: [phone]  Emotional Assessment Appearance:: Appears stated age Attitude/Demeanor/Rapport: Engaged Affect (typically observed): Pleasant  Orientation: : Oriented to Self Alcohol / Substance Use: Not Applicable Psych Involvement: No  (comment)  Admission diagnosis:  Lower lobe pneumonia [J18.9] Patient Active Problem List   Diagnosis Date Noted   Lower lobe pneumonia 04/03/2022   Debility 04/03/2022   Pyuria 12/18/2021   Fever 12/18/2021   Macrocytic anemia 12/18/2021   Malnutrition of moderate degree 12/18/2021   Fracture of femoral neck, left ([place]) 12/17/2021   Hip fracture ([place]) 12/17/2021   Delirium 11/09/2021   HTN (hypertension) 11/08/2021   Hypokalemia 11/08/2021   Compression fracture of body of thoracic vertebra (HCC) 11/06/2021   Acute anemia 11/06/2021   Cognitive impairment 11/06/2021   Lactic acidosis 11/06/2021   Black stools    Heme + stool    Peptic ulcer disease    Acute metabolic encephalopathy 09/47/0962   Closed wedge compression fracture of T12 vertebra ([place]) 11/24/2016   Chronic midline low back pain 11/18/2016   Fatigue 11/18/2016   Chronic kidney disease, stage 3a ([place]) 11/18/2016   IBS (irritable bowel syndrome)    UTI (urinary tract infection)    Routine general medical examination at a health care facility 05/03/2013   Chronic idiopathic constipation 03/31/2012   Other screening mammogram 03/24/2011   Depression with somatization 03/06/2010   Insomnia 03/08/2008   Hyperlipidemia with target LDL less than 130 02/21/2007   PCP:  Wardell Honour, MD Pharmacy:  No Pharmacies Listed    Social Determinants of Health (SDOH) Interventions    Readmission Risk Interventions    04/04/2022    1:26 PM  Readmission Risk Prevention Plan  Transportation Screening Complete  PCP or Specialist Appt within 5-7 Days Complete  Home Care Screening Complete  Medication Review (RN CM) Complete

## 2022-04-04 NOTE — NC FL2 (Signed)
[place] LEVEL OF CARE SCREENING TOOL     IDENTIFICATION  Patient Name: Evelyn [name] Birthdate: June 17, 1925 Sex: female Admission Date (Current Location): 04/02/2022  [facility] and [place] Number:  Herbalist and Address:  [facility],  [address] [place], [place]      Provider Number: [id number]  Attending Physician Name and Address:  [place] Eric J, DO  Relative Name and Phone Number:  Daughter, Lattie Haw Hipp [phone]    Current Level of Care: Hospital Recommended Level of Care: [place] Prior Approval Number:    Date Approved/Denied:   PASRR Number:    Discharge Plan: Other (Comment) (ALF)    Current Diagnoses: Patient Active Problem List   Diagnosis Date Noted   Lower lobe pneumonia 04/03/2022   Debility 04/03/2022   Pyuria 12/18/2021   Fever 12/18/2021   Macrocytic anemia 12/18/2021   Malnutrition of moderate degree 12/18/2021   Fracture of femoral neck, left ([place]) 12/17/2021   Hip fracture ([place]) 12/17/2021   Delirium 11/09/2021   HTN (hypertension) 11/08/2021   Hypokalemia 11/08/2021   Compression fracture of body of thoracic vertebra ([place]) 11/06/2021   Acute anemia 11/06/2021   Cognitive impairment 11/06/2021   Lactic acidosis 11/06/2021   Black stools    Heme + stool    Peptic ulcer disease    Acute metabolic encephalopathy 08/65/7846   Closed wedge compression fracture of T12 vertebra ([place]) 11/24/2016   Chronic midline low back pain 11/18/2016   Fatigue 11/18/2016   Chronic kidney disease, stage 3a ([place]) 11/18/2016   IBS (irritable bowel syndrome)    UTI (urinary tract infection)    Routine general medical examination at a health care facility 05/03/2013   Chronic idiopathic constipation 03/31/2012   Other screening mammogram 03/24/2011   Depression with somatization 03/06/2010   Insomnia 03/08/2008   Hyperlipidemia with target LDL less than 130 02/21/2007    Orientation RESPIRATION  BLADDER Height & Weight     Self, Situation, Place  Normal Continent Weight: 100 lb (45.4 kg) Height:  5\' 3"  (160 cm)  BEHAVIORAL SYMPTOMS/MOOD NEUROLOGICAL BOWEL NUTRITION STATUS      Continent Diet (Regular)  AMBULATORY STATUS COMMUNICATION OF NEEDS Skin   Limited Assist Verbally Normal                       Personal Care Assistance Level of Assistance  Bathing, Dressing, Feeding Bathing Assistance: Limited assistance Feeding assistance: Independent Dressing Assistance: Limited assistance     Functional Limitations Info  Sight, Hearing, Speech Sight Info: Impaired Hearing Info: Adequate Speech Info: Adequate    SPECIAL CARE FACTORS FREQUENCY  PT (By licensed PT), OT (By licensed OT)     PT Frequency: 2-3x/wk OT Frequency: 2-3x/wk            Contractures Contractures Info: Not present    Additional Factors Info  Code Status, Allergies Code Status Info: DNR Allergies Info: No Known Allergies           Current Medications (04/04/2022):  This is the current hospital active medication list Current Facility-Administered Medications  Medication Dose Route Frequency Provider Last Rate Last Admin   acetaminophen (TYLENOL) tablet 650 mg  650 mg Oral Q6H PRN Lenore Cordia, MD       Or   acetaminophen (TYLENOL) suppository 650 mg  650 mg Rectal Q6H PRN Lenore Cordia, MD       azithromycin (ZITHROMAX) 500 mg in sodium chloride  0.9 % 250 mL IVPB  500 mg Intravenous Q24H Zada Finders R, MD 250 mL/hr at 04/03/22 2247 500 mg at 04/03/22 2247   cefTRIAXone (ROCEPHIN) 2 g in sodium chloride 0.9 % 100 mL IVPB  2 g Intravenous Q24H Zada Finders R, MD 200 mL/hr at 04/03/22 2208 2 g at 04/03/22 2208   enoxaparin (LOVENOX) injection 30 mg  30 mg Subcutaneous Q24H Zada Finders R, MD   30 mg at 04/03/22 2203   feeding supplement (ENSURE ENLIVE / ENSURE PLUS) liquid 237 mL  237 mL Oral BID BM [place] Eric J, DO       FLUoxetine (PROZAC) capsule 20 mg  20 mg Oral Daily  Zada Finders R, MD   20 mg at 04/04/22 0958   HYDROcodone-acetaminophen (NORCO/VICODIN) 5-325 MG per tablet 1 tablet  1 tablet Oral Q8H PRN Lenore Cordia, MD   1 tablet at 04/04/22 0958   ondansetron (ZOFRAN) tablet 4 mg  4 mg Oral Q6H PRN Lenore Cordia, MD       Or   ondansetron (ZOFRAN) injection 4 mg  4 mg Intravenous Q6H PRN Lenore Cordia, MD       phosphorus (K PHOS NEUTRAL) tablet 500 mg  500 mg Oral Once [place] Eric J, DO       zolpidem (AMBIEN) tablet 5 mg  5 mg Oral QHS Lenore Cordia, MD   5 mg at 04/03/22 2203     Discharge Medications: Please see discharge summary for a list of discharge medications.  Relevant Imaging Results:  Relevant Lab Results:   Additional Information SSN: [id number]  Vassie Moselle, LCSW

## 2022-04-04 NOTE — Progress Notes (Signed)
Initial Nutrition Assessment RD working remotely.  DOCUMENTATION CODES:   Underweight  INTERVENTION:  - ordered Ensure Plus High Protein po BID, each supplement provides 350 kcal and 20 grams of protein.  - added High Calorie, High Protein handouts from the Academy of [employer] Dietetics to the AVS.  - complete NFPE when feasible.   NUTRITION DIAGNOSIS:   Increased nutrient needs related to acute illness, chronic illness as evidenced by estimated needs.  GOAL:   Patient will meet greater than or equal to 90% of their needs  MONITOR:   PO intake, Supplement acceptance, Labs, Weight trends  REASON FOR ASSESSMENT:   Malnutrition Screening Tool, Consult Assessment of nutrition requirement/status  ASSESSMENT:   88 y.o. 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 She presented to the ED for evaluation of generalized weakness. Patient states that she has been residing at [facility] since her hip replacement and that she has been ambulating with the use of a walker. She has progressive functional decline with generalized weakness and is less ambulatory. She has recurrent UTI and daughter states that she has been essentially on antibiotics for the last 6 months. She is having some increased confusion compared to her baseline which does consist of some memory deficits.  She has occasional cough. Daughter states that she does appear to have some occasional difficulty with swallowing food.  Yesterday patient ate 50% of breakfast, 100% of lunch, and 50% of dinner.   She was evaluated by PT, OT, and SLP yesterday. SLP note reviewed; recommendation was for regular, thin liquids.   Patient was assessed in person by a [place] RD on 12/18/21 at which time she met criteria for moderate malnutrition related to chronic illness as evidenced by  moderate fat depletion, severe muscle depletion.  Weight on that date was 105 lb and weight on 04/02/22 was 100 lb. This indicates 5 lb weight loss (4.8% body weight) in the past ~4 months; not significant for time frame. Suspect severity of malnutrition is ongoing.   Per notes, c.diff pending. Patient admitted with dx of CAP.   Labs reviewed; Cl: 118 mmol/l, Phos: 2.4 mg/dl. Medications reviewed; 2 g IV Mg sulfate x1 run 10/15, 40 mEq Klor-Con x1 dose 10/15. IVF; NS @ 100 ml/hr.    NUTRITION - FOCUSED PHYSICAL EXAM:  RD working remotely.  Diet Order:   Diet Order             DIET SOFT Room service appropriate? Yes; Fluid consistency: Thin  Diet effective now                   EDUCATION NEEDS:   Education needs have been addressed  Skin:  Skin Assessment: Reviewed RN Assessment  Last BM:  10/14 (type 6 x1, small amount)  Height:   Ht Readings from Last 1 Encounters:  04/02/22 _0  (1.6 m)    Weight:   Wt Readings from Last 1 Encounters:  04/02/22 45.4 kg     BMI:  Body mass index is 17.71 kg/m.  Estimated Nutritional Needs:  Kcal:  1370-1600 kcal Protein:  68-80 grams Fluid:  >/= 1.5 L/day     Jarome Matin, MS, RD, LDN, CNSC Clinical Dietitian PRN/Relief staff On-call/weekend pager # available in [facility]

## 2022-04-04 NOTE — Progress Notes (Signed)
PROGRESS NOTE    Evelyn [name]  IPJ:[id number] DOB: [birth year] DOA: 04/02/2022 PCP: Wardell Honour, MD    Brief Narrative:   Evelyn [name] is a 88 y.o. female with past medical history significant for HTN, CKD stage IIIa, s/p left hip replacement 12/17/2021, recurrent UTI, cognitive impairment, blind in the right eye, hiatal hernia, chronic abdominal and back pain who presented to the ED for evaluation of generalized weakness.  History is supplemented by daughter at bedside. Patient states that she has been residing at [facility] since her left hip replacement on 12/17/2021.  She has been ambulating with the use of a walker.  She has been having progressive functional decline with generalized weakness and is less ambulatory.  She has recurrent UTI and daughter states that she has been essentially on antibiotics for the last 6 months.  She denies any current dysuria after most recent antibiotic course. She is having some increased confusion compared to her baseline which does consist of some memory deficits.  She has occasional cough.  She denies any significant dyspnea, chest pain, nausea, vomiting.  Daughter states that she does appear to have some occasional difficulty with swallowing food.  In the ED, BP 117/56, pulse 93, RR 23, temp 97.4 F, SPO2 97% on room air. WBC 8.4, hemoglobin 11.6, platelets 267,000, sodium 138, potassium 3.6, bicarb 18, BUN 26, creatinine 1.05, serum glucose 127, lipase 33.  Urinalysis negative for UTI. Portable chest x-ray negative for focal consolidation, edema, effusion.  Hiatal hernia noted. CT head without contrast negative for acute intracranial abnormality. CT chest without contrast showed partial posterior lower lobe consolidations which may reflect atelectasis, pneumonia, aspiration.  Mild heterogenous groundglass density in the anterior left upper lobe seen and suspected to be infectious in etiology.  Moderate hiatal hernia again noted. Patient was given  1 L normal saline, IV ceftriaxone and azithromycin, IV Protonix 40 mg.  The hospitalist service was consulted to admit for further evaluation and management.  Assessment & Plan:   Community-acquired pneumonia CT shows partial posterior lower lobe consolidations.  Question of pneumonia or aspiration with likely atelectasis.  Currently saturating well on room air.  Strep pneumo urinary antigen negative.  Seen by speech therapy 10/14 with recommendation of regular diet, thin liquids with mild aspiration risk. --Azithromycin 500 mg IV every 24 hours --Ceftriaxone 2 g IV every 24 hours --Incentive spirometer   Debility Progressive generalized weakness, likely age related as well as due to poor oral intake.  Currently resides at [facility]. --PT/OT recommend home health on discharge --Continue therapy efforts while inpatient   Cognitive impairment Family concern for increased confusion compared to baseline.  She is alert and oriented to self, place, but not year.  Otherwise appropriate. --Delirium precautions --Get up during the day --Encourage a familiar face to remain present throughout the day --Keep blinds open and lights on during daylight hours --Minimize the use of opioids/benzodiazepines   Chronic kidney disease, stage 3a ([place]) Renal function stable, creatinine 0.68   Chronic midline low back pain On chronic Norco 5-325 mg TID, changed to as needed for now.   Depression with somatization Continue Prozac 20 mg p.o. daily.  Diarrhea: C. difficile PCR: Negative   DVT prophylaxis: enoxaparin (LOVENOX) injection 30 mg Start: 04/03/22 2200    Code Status: DNR Family Communication: No family present at bedside this morning  Disposition Plan:  Level of care: Med-Surg Status is: Inpatient Remains inpatient appropriate because: IV antibiotics, anticipate discharge back  to ALF in tomorrow    Consultants:  None  Procedures:  None  Antimicrobials:  Azithromycin  10/13>> Ceftriaxone 10/13>>  Subjective: Patient seen examined bedside, resting comfortably.  Pleasantly confused.  Remains on room air.  No specific complaints this morning.  Denies headache, no visual changes, no chest pain, no palpitations, no fever/chills, no nausea/vomiting/diarrhea, no shortness of breath, no abdominal pain, no focal weakness, no fatigue, no paresthesias.  No acute events overnight per nursing staff.  Objective: Vitals:   04/03/22 1115 04/03/22 1426 04/03/22 2101 04/04/22 0456  BP: (!) 144/49 (!) 152/63 (!) 153/75 139/65  Pulse: 78 87 89 86  Resp: 17 18 16 18   Temp: 97.7 F (36.5 C) (!) 97.4 F (36.3 C) 98.6 F (37 C) (!) 97.5 F (36.4 C)  TempSrc: Oral Oral Oral Oral  SpO2:  90% 93% 91%  Weight:      Height:        Intake/Output Summary (Last 24 hours) at 04/04/2022 1113 Last data filed at 04/04/2022 0300 Gross per 24 hour  Intake 3387 ml  Output 2 ml  Net 3385 ml   Filed Weights   04/02/22 1611  Weight: 45.4 kg    Examination:  Physical Exam: GEN: NAD, alert and oriented to place ([place]Hospital) but not time/person, thin/cachectic/elderly in appearance HEENT: NCAT, PERRL, EOMI, sclera clear, MMM PULM: CTAB w/o wheezes/crackles, normal respiratory effort on room air CV: RRR w/o M/G/R GI: abd soft, NTND, NABS, no R/G/M MSK: no peripheral edema, muscle strength globally intact 5/5 bilateral upper/lower extremities NEURO: CN II-XII intact, no focal deficits, sensation to light touch intact PSYCH: Depressed mood, flat affect Integumentary: No concerning rash/lesions/wounds noted on exposed skin surfaces    Data Reviewed: I have personally reviewed following labs and imaging studies  CBC: Recent Labs  Lab 03/31/22 1444 04/02/22 1758 04/03/22 0508  WBC 11.8* 8.4 6.4  NEUTROABS  --  5.7  --   HGB 11.7* 11.6* 9.5*  HCT 36.5 36.5 29.7*  MCV 91.5 92.2 92.0  PLT 275 267 [id number]   Basic Metabolic Panel: Recent Labs  Lab 03/31/22 1444  04/02/22 1758 04/03/22 0508 04/04/22 0530  NA 136 138 135 139  K 4.1 3.6 3.5 3.5  CL 106 113* 115* 118*  CO2 19* 18* 18* 18*  GLUCOSE 103* 127* 96 88  BUN 37* 26* 23 18  CREATININE 1.20* 1.05* 0.91 0.68  CALCIUM 10.4* 10.1 8.9 8.9  MG 2.0  --   --  1.8  PHOS  --   --   --  2.4*   GFR: Estimated Creatinine Clearance: 30.1 mL/min (by C-G formula based on SCr of 0.68 mg/dL). Liver Function Tests: Recent Labs  Lab 04/02/22 1758  AST 54*  ALT 31  ALKPHOS 110  BILITOT 0.4  PROT 6.5  ALBUMIN 2.7*   Recent Labs  Lab 04/02/22 1758  LIPASE 33   No results for input(s): "AMMONIA" in the last 168 hours. Coagulation Profile: No results for input(s): "INR", "PROTIME" in the last 168 hours. Cardiac Enzymes: Recent Labs  Lab 03/31/22 1444  CKTOTAL 23*   BNP (last 3 results) No results for input(s): "PROBNP" in the last 8760 hours. HbA1C: No results for input(s): "HGBA1C" in the last 72 hours. CBG: Recent Labs  Lab 03/31/22 1541  GLUCAP 87   Lipid Profile: No results for input(s): "CHOL", "HDL", "LDLCALC", "TRIG", "CHOLHDL", "LDLDIRECT" in the last 72 hours. Thyroid Function Tests: No results for input(s): "TSH", "T4TOTAL", "FREET4", "T3FREE", "THYROIDAB" in the  last 72 hours. Anemia Panel: No results for input(s): "VITAMINB12", "FOLATE", "FERRITIN", "TIBC", "IRON", "RETICCTPCT" in the last 72 hours. Sepsis Labs: No results for input(s): "PROCALCITON", "LATICACIDVEN" in the last 168 hours.  Recent Results (from the past 240 hour(s))  SARS Coronavirus 2 by RT PCR (hospital order, performed in [facility] hospital lab) *cepheid single result test* Anterior Nasal Swab     Status: None   Collection Time: 03/31/22  6:11 PM   Specimen: Anterior Nasal Swab  Result Value Ref Range Status   SARS Coronavirus 2 by RT PCR NEGATIVE NEGATIVE Final    Comment: (NOTE) SARS-CoV-2 target nucleic acids are NOT DETECTED.  The SARS-CoV-2 RNA is generally detectable in upper and  lower respiratory specimens during the acute phase of infection. The lowest concentration of SARS-CoV-2 viral copies this assay can detect is 250 copies / mL. A negative result does not preclude SARS-CoV-2 infection and should not be used as the sole basis for treatment or other patient management decisions.  A negative result may occur with improper specimen collection / handling, submission of specimen other than nasopharyngeal swab, presence of viral mutation(s) within the areas targeted by this assay, and inadequate number of viral copies (<250 copies / mL). A negative result must be combined with clinical observations, patient history, and epidemiological information.  Fact Sheet for Patients:   RoadLapTop.co.zahttps://www.fda.gov/media/158405/download  Fact Sheet for Healthcare Providers: http://kim-miller.com/https://www.fda.gov/media/158404/download  This test is not yet approved or  cleared by the [place] FDA and has been authorized for detection and/or diagnosis of SARS-CoV-2 by FDA under an Emergency Use Authorization (EUA).  This EUA will remain in effect (meaning this test can be used) for the duration of the COVID-19 declaration under Section 564(b)(1) of the Act, 21 U.S.C. section 360bbb-3(b)(1), unless the authorization is terminated or revoked sooner.  Performed at [facility], [address], [place], [place] [address]   SARS Coronavirus 2 by RT PCR (hospital order, performed in [facility] hospital lab) *cepheid single result test* Anterior Nasal Swab     Status: None   Collection Time: 04/02/22  5:01 PM   Specimen: Anterior Nasal Swab  Result Value Ref Range Status   SARS Coronavirus 2 by RT PCR NEGATIVE NEGATIVE Final    Comment: (NOTE) SARS-CoV-2 target nucleic acids are NOT DETECTED.  The SARS-CoV-2 RNA is generally detectable in upper and lower respiratory specimens during the acute phase of infection. The lowest concentration of SARS-CoV-2 viral copies this assay  can detect is 250 copies / mL. A negative result does not preclude SARS-CoV-2 infection and should not be used as the sole basis for treatment or other patient management decisions.  A negative result may occur with improper specimen collection / handling, submission of specimen other than nasopharyngeal swab, presence of viral mutation(s) within the areas targeted by this assay, and inadequate number of viral copies (<250 copies / mL). A negative result must be combined with clinical observations, patient history, and epidemiological information.  Fact Sheet for Patients:   RoadLapTop.co.zahttps://www.fda.gov/media/158405/download  Fact Sheet for Healthcare Providers: http://kim-miller.com/https://www.fda.gov/media/158404/download  This test is not yet approved or  cleared by the [place] FDA and has been authorized for detection and/or diagnosis of SARS-CoV-2 by FDA under an Emergency Use Authorization (EUA).  This EUA will remain in effect (meaning this test can be used) for the duration of the COVID-19 declaration under Section 564(b)(1) of the Act, 21 U.S.C. section 360bbb-3(b)(1), unless the authorization is terminated or revoked sooner.  Performed at  [facility], [place] [address] [place], [place] [address]   C Difficile Quick Screen w PCR reflex     Status: None   Collection Time: 04/03/22  6:20 PM   Specimen: STOOL  Result Value Ref Range Status   C Diff antigen NEGATIVE NEGATIVE Final   C Diff toxin NEGATIVE NEGATIVE Final   C Diff interpretation No C. difficile detected.  Final    Comment: Performed at [facility], [place] [address] [place], [place] [address]         Radiology Studies: CT Chest Wo Contrast  Result Date: 04/02/2022 CLINICAL DATA:  Progressive weakness back pain EXAM: CT CHEST WITHOUT CONTRAST TECHNIQUE: Multidetector CT imaging of the chest was performed following the standard protocol without IV contrast. RADIATION DOSE REDUCTION: This exam  was performed according to the departmental dose-optimization program which includes automated exposure control, adjustment of the mA and/or kV according to patient size and/or use of iterative reconstruction technique. COMPARISON:  Chest x-ray 04/02/2022, radiograph 12/17/2021, CT 04/02/2022, 11/05/2021 FINDINGS: Cardiovascular: Limited evaluation without intravenous contrast. Moderate aortic atherosclerosis. No aneurysm. Coronary vascular calcification. Normal cardiac size. No pericardial effusion Mediastinum/Nodes: Midline trachea. No thyroid mass. No suspicious lymph nodes. Moderate large hiatal hernia with thickened appearing stomach in the hernia sac. Lungs/Pleura: No pleural effusion or pneumothorax. Mild diffuse septal thickening. Partial lower lobe consolidations with mild air bronchograms. Mild ground-glass densities within the left upper lobe anteriorly. Minimal bronchiectasis in the right lower lobe. Mild subpleural reticulation. Upper Abdomen: No acute finding. Musculoskeletal: Old sternal manubrial fracture. Chronic moderate compression fracture T12. Multiple old left-sided rib fractures. IMPRESSION: 1. Partial posterior lower lobe consolidations, this may reflect atelectasis, pneumonia or aspiration. Mild heterogeneous ground-glass density in the anterior left upper lobe is suspected to be infectious in etiology. 2. Moderate hiatal hernia Aortic Atherosclerosis (ICD10-I70.0). Electronically Signed   By: Donavan Foil M.D.   On: 04/02/2022 23:50   CT Head Wo Contrast  Result Date: 04/02/2022 CLINICAL DATA:  Mental status change, unknown cause. Progressive weakness. EXAM: CT HEAD WITHOUT CONTRAST TECHNIQUE: Contiguous axial images were obtained from the base of the skull through the vertex without intravenous contrast. RADIATION DOSE REDUCTION: This exam was performed according to the departmental dose-optimization program which includes automated exposure control, adjustment of the mA and/or kV  according to patient size and/or use of iterative reconstruction technique. COMPARISON:  11/08/2021. FINDINGS: Brain: No acute intracranial hemorrhage, midline shift or mass effect. No extra-axial fluid collection. Diffuse atrophy is noted. Periventricular white matter hypodensities are present bilaterally. No hydrocephalus. There is chronic asymmetry of the lateral ventricles with encephalomalacia of the caudate and/or external capsule on the right. Vascular: No hyperdense vessel or unexpected calcification. Skull: Normal. Negative for fracture or focal lesion. Sinuses/Orbits: No acute finding. Other: None. IMPRESSION: Stable head CT with no acute intracranial abnormality. Electronically Signed   By: Brett Fairy M.D.   On: 04/02/2022 23:46   DG Chest Port 1 View  Result Date: 04/02/2022 CLINICAL DATA:  Weakness EXAM: PORTABLE CHEST 1 VIEW COMPARISON:  12/20/2021 FINDINGS: No acute airspace disease. No pleural effusion. Stable cardiomediastinal silhouette with aortic atherosclerosis and hiatal hernia. No pneumothorax. Multiple left-sided rib fractures which are new compared to prior radiograph but appear subacute to chronic. IMPRESSION: No active disease.  Hiatal hernia Electronically Signed   By: Donavan Foil M.D.   On: 04/02/2022 22:54   CT Renal Stone Study  Result Date: 04/02/2022 CLINICAL DATA:  Flank pain EXAM: CT ABDOMEN  AND PELVIS WITHOUT CONTRAST TECHNIQUE: Multidetector CT imaging of the abdomen and pelvis was performed following the standard protocol without IV contrast. RADIATION DOSE REDUCTION: This exam was performed according to the departmental dose-optimization program which includes automated exposure control, adjustment of the mA and/or kV according to patient size and/or use of iterative reconstruction technique. COMPARISON:  11/05/2021 FINDINGS: Lower chest: Coronary artery calcifications are seen. There is large fixed hiatal hernia. There are new patchy infiltrates in posterior  lower lung fields on both sides. Left hemidiaphragm is elevated. Small bilateral pleural effusions are seen. Hepatobiliary: There is 12 mm low-density in the right lobe with no significant change, possibly cysts or hemangiomas. There is no dilation of bile ducts. Gallbladder is not distended. Pancreas: No focal abnormalities are seen. Spleen: Coarse calcification is seen in the spleen. Spleen is not enlarged. Adrenals/Urinary Tract: Adrenals are unremarkable. There is no hydronephrosis. There are bilateral renal cysts largest in the upper pole of left kidney measuring 5.3 cm. There are no renal or ureteral stones. Evaluation of bladder is limited by beam hardening artifacts. Stomach/Bowel: Large fixed hiatal hernia is seen. Stomach is unremarkable. Small bowel loops are not dilated. Appendix is not seen. There is no pericecal inflammation. Vascular/Lymphatic: Extensive arterial calcifications are seen. Reproductive: Uterus is not seen. Beam hardening artifacts caused by surgical hardware in left hip limit evaluation of lower pelvis. Other: There is no ascites or pneumoperitoneum. Musculoskeletal: There is left hip arthroplasty. Degenerative changes are noted in lumbar spine. There is decrease in height of bodies of T12 and L3 vertebrae with no significant interval change. There is S shaped scoliosis in lumbar spine. IMPRESSION: There is no evidence of intestinal obstruction or pneumoperitoneum. There is no hydronephrosis. Coronary artery disease. Large fixed hiatal hernia. There are new patchy infiltrates in the lower lung fields, more so on the right side suggesting atelectasis/pneumonia. Minimal bilateral pleural effusions are seen. There is decrease in height of bodies of T12 and L3 vertebrae suggesting old compression fractures. Other findings as described in the body of the report. Electronically Signed   By: Elmer Picker M.D.   On: 04/02/2022 17:45        Scheduled Meds:  enoxaparin (LOVENOX)  injection  30 mg Subcutaneous Q24H   feeding supplement  237 mL Oral BID BM   FLUoxetine  20 mg Oral Daily   zolpidem  5 mg Oral QHS   Continuous Infusions:  sodium chloride 100 mL/hr at 04/03/22 1725   azithromycin 500 mg (04/03/22 2247)   cefTRIAXone (ROCEPHIN)  IV 2 g (04/03/22 2208)     LOS: 1 day    Time spent: 51 minutes spent on chart review, discussion with nursing staff, consultants, updating family and interview/physical exam; more than 50% of that time was spent in counseling and/or coordination of care.    Aleyza Salmi J [place] DO [facility] Available via Epic secure chat 7am-7pm After these hours, please refer to coverage provider listed on [email or web address] 04/04/2022, 11:13 AM

## 2022-04-04 NOTE — Discharge Instructions (Signed)

## 2022-04-04 NOTE — Progress Notes (Signed)
FYI--Writer called to pt's room by daughter (son on speaker phone). Questioning antibiotic schedule. Also, concerned with pt's ability to return to ALF tomorrow (or anytime soon). Assured daughter that pt has been able to walk with walker and standby assist to bathroom and sat in chair to eat meals. Let them both know the antibiotics are IV and are given at midnight per schedule. Also, that OT and PT has seen pt yesterday and put in their evaluations for patient once she returns to ALF. They are requesting OT and PT to see pt again before discharge, as they feel pt is unable to return to ALF at this time.

## 2022-04-05 MED ORDER — LIP MEDEX EX OINT
TOPICAL_OINTMENT | CUTANEOUS | Status: DC | PRN
  Administered 2022-04-05: 75 via TOPICAL
  Filled 2022-04-05: qty 7

## 2022-04-05 MED ORDER — PHENOL 1.4 % MT LIQD
1.0000 | OROMUCOSAL | Status: DC | PRN
  Administered 2022-04-05: 1 via OROMUCOSAL
  Filled 2022-04-05: qty 177

## 2022-04-05 NOTE — Progress Notes (Signed)
PROGRESS NOTE    Evelyn [name]  [id number] DOB: [birth year] DOA: 04/02/2022 PCP: Wardell Honour, MD    Brief Narrative:   Evelyn [name] is a 88 y.o. female with past medical history significant for HTN, CKD stage IIIa, s/p left hip replacement 12/17/2021, recurrent UTI, cognitive impairment, blind in the right eye, hiatal hernia, chronic abdominal and back pain who presented to the ED for evaluation of generalized weakness.  History is supplemented by daughter at bedside. Patient states that she has been residing at [facility] since her left hip replacement on 12/17/2021.  She has been ambulating with the use of a walker.  She has been having progressive functional decline with generalized weakness and is less ambulatory.  She has recurrent UTI and daughter states that she has been essentially on antibiotics for the last 6 months.  She denies any current dysuria after most recent antibiotic course. She is having some increased confusion compared to her baseline which does consist of some memory deficits.  She has occasional cough.  She denies any significant dyspnea, chest pain, nausea, vomiting.  Daughter states that she does appear to have some occasional difficulty with swallowing food.  In the ED, BP 117/56, pulse 93, RR 23, temp 97.4 F, SPO2 97% on room air. WBC 8.4, hemoglobin 11.6, platelets 267,000, sodium 138, potassium 3.6, bicarb 18, BUN 26, creatinine 1.05, serum glucose 127, lipase 33.  Urinalysis negative for UTI. Portable chest x-ray negative for focal consolidation, edema, effusion.  Hiatal hernia noted. CT head without contrast negative for acute intracranial abnormality. CT chest without contrast showed partial posterior lower lobe consolidations which may reflect atelectasis, pneumonia, aspiration.  Mild heterogenous groundglass density in the anterior left upper lobe seen and suspected to be infectious in etiology.  Moderate hiatal hernia again noted. Patient was given  1 L normal saline, IV ceftriaxone and azithromycin, IV Protonix 40 mg.  The hospitalist service was consulted to admit for further evaluation and management.  Assessment & Plan:   Community-acquired pneumonia CT shows partial posterior lower lobe consolidations.  Question of pneumonia or aspiration with likely atelectasis.  Currently saturating well on room air.  Strep pneumo urinary antigen negative.  Seen by speech therapy 10/14 with recommendation of regular diet, thin liquids with mild aspiration risk. --Azithromycin 500 mg IV every 24 hours --Ceftriaxone 2 g IV every 24 hours --Incentive spirometer   Debility Progressive generalized weakness, likely age related as well as due to poor oral intake.  Currently resides at [facility]. --PT/OT recommend home health on discharge; awaiting reevaluation today --Continue therapy efforts while inpatient   Cognitive impairment Family concern for increased confusion compared to baseline.  She is alert and oriented to self, place, but not year.  Otherwise appropriate. --Delirium precautions --Get up during the day --Encourage a familiar face to remain present throughout the day --Keep blinds open and lights on during daylight hours --Minimize the use of opioids/benzodiazepines   Chronic kidney disease, stage 3a ([place]) Renal function stable, creatinine 0.68   Chronic midline low back pain On chronic Norco 5-325 mg TID, changed to as needed for now.   Depression with somatization Continue Prozac 20 mg p.o. daily.  Diarrhea: C. difficile PCR: Negative   DVT prophylaxis: enoxaparin (LOVENOX) injection 30 mg Start: 04/03/22 2200    Code Status: DNR Family Communication: No family present at bedside this morning updated patient's [name] extensively via telephone yesterday afternoon  Disposition Plan:  Level of care: Med-Surg  Status is: Inpatient Remains inpatient appropriate because: IV antibiotics, anticipate discharge back to  ALF tomorrow    Consultants:  None  Procedures:  None  Antimicrobials:  Azithromycin 10/13>> Ceftriaxone 10/13>>  Subjective: Patient seen examined bedside, resting comfortably.  Pleasantly confused.  Remains on room air.  No specific complaints this morning.  Denies headache, no visual changes, no chest pain, no palpitations, no fever/chills, no nausea/vomiting/diarrhea, no shortness of breath, no abdominal pain, no focal weakness, no fatigue, no paresthesias.  RN reports patient with poor sleep overnight, otherwise no acute events overnight per nursing staff.  Objective: Vitals:   04/04/22 0456 04/04/22 1339 04/04/22 1934 04/05/22 0440  BP: 139/65 (!) 161/56 (!) 171/60 (!) 158/64  Pulse: 86 88 79 79  Resp: 18 15 17 18   Temp: (!) 97.5 F (36.4 C) 97.7 F (36.5 C) (!) 97.5 F (36.4 C) 98.2 F (36.8 C)  TempSrc: Oral Oral  Oral  SpO2: 91% 91% 95% 92%  Weight:      Height:        Intake/Output Summary (Last 24 hours) at 04/05/2022 1149 Last data filed at 04/05/2022 [id number] Gross per 24 hour  Intake 720 ml  Output 600 ml  Net 120 ml   Filed Weights   04/02/22 1611  Weight: 45.4 kg    Examination:  Physical Exam: GEN: NAD, alert and oriented to place ([place]/Hospital) but not time/person, thin/cachectic/elderly in appearance HEENT: NCAT, PERRL, EOMI, sclera clear, MMM PULM: CTAB w/o wheezes/crackles, normal respiratory effort on room air CV: RRR w/o M/G/R GI: abd soft, NTND, NABS, no R/G/M MSK: no peripheral edema, muscle strength globally intact 5/5 bilateral upper/lower extremities NEURO: CN II-XII intact, no focal deficits, sensation to light touch intact PSYCH: Normal mood, flat affect Integumentary: No concerning rash/lesions/wounds noted on exposed skin surfaces    Data Reviewed: I have personally reviewed following labs and imaging studies  CBC: Recent Labs  Lab 03/31/22 1444 04/02/22 1758 04/03/22 0508  WBC 11.8* 8.4 6.4  NEUTROABS  --  5.7  --    HGB 11.7* 11.6* 9.5*  HCT 36.5 36.5 29.7*  MCV 91.5 92.2 92.0  PLT 275 267 [id number]   Basic Metabolic Panel: Recent Labs  Lab 03/31/22 1444 04/02/22 1758 04/03/22 0508 04/04/22 0530  NA 136 138 135 139  K 4.1 3.6 3.5 3.5  CL 106 113* 115* 118*  CO2 19* 18* 18* 18*  GLUCOSE 103* 127* 96 88  BUN 37* 26* 23 18  CREATININE 1.20* 1.05* 0.91 0.68  CALCIUM 10.4* 10.1 8.9 8.9  MG 2.0  --   --  1.8  PHOS  --   --   --  2.4*   GFR: Estimated Creatinine Clearance: 30.1 mL/min (by C-G formula based on SCr of 0.68 mg/dL). Liver Function Tests: Recent Labs  Lab 04/02/22 1758  AST 54*  ALT 31  ALKPHOS 110  BILITOT 0.4  PROT 6.5  ALBUMIN 2.7*   Recent Labs  Lab 04/02/22 1758  LIPASE 33   No results for input(s): "AMMONIA" in the last 168 hours. Coagulation Profile: No results for input(s): "INR", "PROTIME" in the last 168 hours. Cardiac Enzymes: Recent Labs  Lab 03/31/22 1444  CKTOTAL 23*   BNP (last 3 results) No results for input(s): "PROBNP" in the last 8760 hours. HbA1C: No results for input(s): "HGBA1C" in the last 72 hours. CBG: Recent Labs  Lab 03/31/22 1541  GLUCAP 87   Lipid Profile: No results for input(s): "CHOL", "HDL", "LDLCALC", "TRIG", "CHOLHDL", "LDLDIRECT"  in the last 72 hours. Thyroid Function Tests: No results for input(s): "TSH", "T4TOTAL", "FREET4", "T3FREE", "THYROIDAB" in the last 72 hours. Anemia Panel: No results for input(s): "VITAMINB12", "FOLATE", "FERRITIN", "TIBC", "IRON", "RETICCTPCT" in the last 72 hours. Sepsis Labs: No results for input(s): "PROCALCITON", "LATICACIDVEN" in the last 168 hours.  Recent Results (from the past 240 hour(s))  SARS Coronavirus 2 by RT PCR (hospital order, performed in [facility] hospital lab) *cepheid single result test* Anterior Nasal Swab     Status: None   Collection Time: 03/31/22  6:11 PM   Specimen: Anterior Nasal Swab  Result Value Ref Range Status   SARS Coronavirus 2 by RT PCR NEGATIVE  NEGATIVE Final    Comment: (NOTE) SARS-CoV-2 target nucleic acids are NOT DETECTED.  The SARS-CoV-2 RNA is generally detectable in upper and lower respiratory specimens during the acute phase of infection. The lowest concentration of SARS-CoV-2 viral copies this assay can detect is 250 copies / mL. A negative result does not preclude SARS-CoV-2 infection and should not be used as the sole basis for treatment or other patient management decisions.  A negative result may occur with improper specimen collection / handling, submission of specimen other than nasopharyngeal swab, presence of viral mutation(s) within the areas targeted by this assay, and inadequate number of viral copies (<250 copies / mL). A negative result must be combined with clinical observations, patient history, and epidemiological information.  Fact Sheet for Patients:   https://www.patel.info/  Fact Sheet for Healthcare Providers: https://hall.com/  This test is not yet approved or  cleared by the [place] FDA and has been authorized for detection and/or diagnosis of SARS-CoV-2 by FDA under an Emergency Use Authorization (EUA).  This EUA will remain in effect (meaning this test can be used) for the duration of the COVID-19 declaration under Section 564(b)(1) of the Act, 21 U.S.C. section 360bbb-3(b)(1), unless the authorization is terminated or revoked sooner.  Performed at [facility], [address], [place], [place] [address]   SARS Coronavirus 2 by RT PCR (hospital order, performed in [facility] hospital lab) *cepheid single result test* Anterior Nasal Swab     Status: None   Collection Time: 04/02/22  5:01 PM   Specimen: Anterior Nasal Swab  Result Value Ref Range Status   SARS Coronavirus 2 by RT PCR NEGATIVE NEGATIVE Final    Comment: (NOTE) SARS-CoV-2 target nucleic acids are NOT DETECTED.  The SARS-CoV-2 RNA is generally  detectable in upper and lower respiratory specimens during the acute phase of infection. The lowest concentration of SARS-CoV-2 viral copies this assay can detect is 250 copies / mL. A negative result does not preclude SARS-CoV-2 infection and should not be used as the sole basis for treatment or other patient management decisions.  A negative result may occur with improper specimen collection / handling, submission of specimen other than nasopharyngeal swab, presence of viral mutation(s) within the areas targeted by this assay, and inadequate number of viral copies (<250 copies / mL). A negative result must be combined with clinical observations, patient history, and epidemiological information.  Fact Sheet for Patients:   https://www.patel.info/  Fact Sheet for Healthcare Providers: https://hall.com/  This test is not yet approved or  cleared by the [place] FDA and has been authorized for detection and/or diagnosis of SARS-CoV-2 by FDA under an Emergency Use Authorization (EUA).  This EUA will remain in effect (meaning this test can be used) for the duration of the COVID-19 declaration under Section  564(b)(1) of the Act, 21 U.S.C. section 360bbb-3(b)(1), unless the authorization is terminated or revoked sooner.  Performed at [facility], [place] [address], [place], [place] [address]   C Difficile Quick Screen w PCR reflex     Status: None   Collection Time: 04/03/22  6:20 PM   Specimen: STOOL  Result Value Ref Range Status   C Diff antigen NEGATIVE NEGATIVE Final   C Diff toxin NEGATIVE NEGATIVE Final   C Diff interpretation No C. difficile detected.  Final    Comment: Performed at [facility], [place] [address] [place], [place] [address]         Radiology Studies: No results found.      Scheduled Meds:  enoxaparin (LOVENOX) injection  30 mg Subcutaneous Q24H   feeding supplement   237 mL Oral BID BM   FLUoxetine  20 mg Oral Daily   zolpidem  5 mg Oral QHS   Continuous Infusions:  azithromycin Stopped (04/05/22 0017)   cefTRIAXone (ROCEPHIN)  IV Stopped (04/04/22 2307)     LOS: 2 days    Time spent: 48 minutes spent on chart review, discussion with nursing staff, consultants, updating family and interview/physical exam; more than 50% of that time was spent in counseling and/or coordination of care.    Estalee Mccandlish J [place] DO [facility] Available via Epic secure chat 7am-7pm After these hours, please refer to coverage provider listed on [email or web address] 04/05/2022, 11:49 AM

## 2022-04-05 NOTE — Progress Notes (Signed)
Physical Therapy Treatment Patient Details Name: Evelyn [name] MRN: [id number] DOB: Feb 25, 1925 Today's Date: 04/05/2022   History of Present Illness 88 y.o. 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    PT Comments    Progressing with mobility. O2 93% on RA during session. No family present during session.    Recommendations for follow up therapy are one component of a multi-disciplinary discharge planning process, led by the attending physician.  Recommendations may be updated based on patient status, additional functional criteria and insurance authorization.  Follow Up Recommendations  Home health PT     Assistance Recommended at Discharge Intermittent Supervision/Assistance  Patient can return home with the following A little help with walking and/or transfers;A little help with bathing/dressing/bathroom;Assistance with cooking/housework;Assist for transportation   Equipment Recommendations  None recommended by PT    Recommendations for Other Services       Precautions / Restrictions Precautions Precautions: Fall Restrictions Weight Bearing Restrictions: No     Mobility  Bed Mobility               General bed mobility comments: oob in recliner    Transfers Overall transfer level: Needs assistance Equipment used: Rolling walker (2 wheels) Transfers: Sit to/from Stand Sit to Stand: Min guard           General transfer comment: slow transition, but min/guard for safety    Ambulation/Gait Ambulation/Gait assistance: Min guard Gait Distance (Feet): 150 Feet Assistive device: Rolling walker (2 wheels) Gait Pattern/deviations: Step-through pattern, Trunk flexed       General Gait Details: Cues  for safety. O2 93% on RA. Pt tolerated distance well although she did report feeling weak.   Stairs             Wheelchair Mobility    Modified Rankin (Stroke Patients Only)       Balance Overall balance assessment: History of Falls                                          Cognition Arousal/Alertness: Awake/alert Behavior During Therapy: WFL for tasks assessed/performed Overall Cognitive Status: No family/caregiver present to determine baseline cognitive functioning                                 General Comments: Short term memory issues, but able to tell me about breaking her hip in June and moving from her house to ALF.        Exercises      General Comments        Pertinent Vitals/Pain Pain Assessment Pain Assessment: No/denies pain    Home Living                          Prior Function            PT Goals (current goals can now be found in the care plan section) Progress towards PT goals: Progressing toward goals    Frequency    Min 3X/week      PT Plan Current plan remains appropriate  Co-evaluation              AM-PAC PT "6 Clicks" Mobility   Outcome Measure  Help needed turning from your back to your side while in a flat bed without using bedrails?: None Help needed moving from lying on your back to sitting on the side of a flat bed without using bedrails?: None Help needed moving to and from a bed to a chair (including a wheelchair)?: A Little Help needed standing up from a chair using your arms (e.g., wheelchair or bedside chair)?: A Little Help needed to walk in hospital room?: A Little Help needed climbing 3-5 steps with a railing? : A Little 6 Click Score: 20    End of Session Equipment Utilized During Treatment: Gait belt Activity Tolerance: Patient tolerated treatment well Patient left: in chair;with call bell/phone within reach;with chair alarm set   PT Visit Diagnosis:  Muscle weakness (generalized) (M62.81);Difficulty in walking, not elsewhere classified (R26.2)     Time: 7681-1572 PT Time Calculation (min) (ACUTE ONLY): 17 min  Charges:  $Gait Training: 8-22 mins                        Doreatha Massed, PT Acute Rehabilitation  Office: [phone] Pager: [phone]

## 2022-04-05 NOTE — Progress Notes (Signed)
Mobility Specialist - Progress Note   04/05/22 1307  Mobility  Activity Ambulated with assistance to bathroom  Level of Assistance Standby assist, set-up cues, supervision of patient - no hands on  Assistive Device Front wheel walker  Distance Ambulated (ft) 20 ft  Activity Response Tolerated well  Mobility Referral Yes  $Mobility charge 1 Mobility   Pt received in bed requesting ambulation to bathroom, no c/o pain nor discomfort. Pt back to bed with all needs met and NT in room.   Roderick Pee Mobility Specialist

## 2022-04-06 ENCOUNTER — Other Ambulatory Visit (HOSPITAL_COMMUNITY): Payer: Self-pay

## 2022-04-06 MED ORDER — CEFDINIR 300 MG PO CAPS
300.0000 mg | ORAL_CAPSULE | Freq: Two times a day (BID) | ORAL | 0 refills | Status: AC
  Filled 2022-04-06: qty 6, 3d supply, fill #0

## 2022-04-06 MED ORDER — AZITHROMYCIN 500 MG PO TABS
500.0000 mg | ORAL_TABLET | Freq: Every day | ORAL | 0 refills | Status: AC
  Filled 2022-04-06: qty 1, 1d supply, fill #0

## 2022-04-06 NOTE — TOC Transition Note (Signed)
Transition of Care [facility]) - CM/SW Discharge Note   Patient Details  Name: Evelyn [name] MRN: [id number] Date of Birth: 1925/06/13  Transition of [facility]) CM/SW Contact:  Vassie Moselle, [place] Phone Number: 04/06/2022, 10:23 AM   Clinical Narrative:    Pt is to return to [facility] and will be going to room 126. RN to call report to [phone]. CSW spoke with pt's daughter who is agreeable to discharge plan and has requested pt be transported via [place]. PTAR to be arranged for transport.    Final next level of care: Assisted Living (with home health services) Barriers to Discharge: Barriers Resolved   Patient Goals and CMS Choice Patient states their goals for this hospitalization and ongoing recovery are:: Per daughter to return to [place] [employer].gov Compare Post Acute Care list provided to:: Patient Represenative (must comment) (Daughter) Choice offered to / list presented to : Adult Children, Patient  Discharge Placement              Patient chooses bed at: [facility] [place] Patient to be transferred to facility by: [place] Name of family member notified: Lattie Haw Hipp Patient and family notified of of transfer: 04/06/22  Discharge Plan and Services In-house Referral: NA Discharge Planning Services: CM Consult Post Acute Care Choice: Home Health, Resumption of Svcs/PTA Provider          DME Arranged: N/A DME Agency: NA       HH Arranged: RN, PT, OT HH Agency: [facility] Date [place]: 04/04/22 Time [place]: 1343 Representative spoke with at [place]: [place] ([place]) Interventions     Readmission Risk Interventions    04/04/2022    1:26 PM  Readmission Risk Prevention Plan  Transportation Screening Complete  PCP or Specialist Appt within 5-7 Days Complete  Home Care Screening Complete  Medication Review (RN CM) Complete

## 2022-04-06 NOTE — Plan of Care (Signed)

## 2022-04-06 NOTE — Discharge Summary (Addendum)
Physician Discharge Summary  [name] [id number] DOB: 1924-09-20 DOA: 04/02/2022  PCP: Wardell Honour, MD  Admit date: 04/02/2022 Discharge date: 04/06/2022  Admitted From: [facility] Disposition: [facility]  Recommendations for Outpatient Follow-up:  Follow up with PCP in 1-2 weeks Continue antibiotics with azithromycin and cefdinir to complete antibiotic course for community-acquired pneumonia  Home Health: PT/OT/RN Equipment/Devices: None  Discharge Condition: Stay able CODE STATUS: DNR Diet recommendation: Regular diet  History of present illness:  Evelyn [name] is a 88 y.o. female with past medical history significant for HTN, CKD stage IIIa, s/p left hip replacement 12/17/2021, recurrent UTI, cognitive impairment, blind in the right eye, hiatal hernia, chronic abdominal and back pain who presented to the ED for evaluation of generalized weakness.  History is supplemented by daughter at bedside. Patient states that she has been residing at [facility] since her left hip replacement on 12/17/2021.  She has been ambulating with the use of a walker.  She has been having progressive functional decline with generalized weakness and is less ambulatory.  She has recurrent UTI and daughter states that she has been essentially on antibiotics for the last 6 months.  She denies any current dysuria after most recent antibiotic course. She is having some increased confusion compared to her baseline which does consist of some memory deficits.  She has occasional cough.  She denies any significant dyspnea, chest pain, nausea, vomiting.  Daughter states that she does appear to have some occasional difficulty with swallowing food.   In the ED, BP 117/56, pulse 93, RR 23, temp 97.4 F, SPO2 97% on room air. WBC 8.4, hemoglobin 11.6, platelets 267,000, sodium 138, potassium 3.6, bicarb 18, BUN 26, creatinine 1.05, serum glucose 127, lipase 33.  Urinalysis negative for UTI.  Portable chest x-ray negative for focal consolidation, edema, effusion.  Hiatal hernia noted. CT head without contrast negative for acute intracranial abnormality. CT chest without contrast showed partial posterior lower lobe consolidations which may reflect atelectasis, pneumonia, aspiration.  Mild heterogenous groundglass density in the anterior left upper lobe seen and suspected to be infectious in etiology.  Moderate hiatal hernia again noted. Patient was given 1 L normal saline, IV ceftriaxone and azithromycin, IV Protonix 40 mg.  The hospitalist service was consulted to admit for further evaluation and management.  Hospital course:  Community-acquired pneumonia CT shows partial posterior lower lobe consolidations.  Question of pneumonia or aspiration with likely atelectasis.  Currently saturating well on room air.  Strep pneumo urinary antigen negative.  Seen by speech therapy 10/14 with recommendation of regular diet, thin liquids with mild aspiration risk.  Patient was started on azithromycin and ceftriaxone we will continue azithromycin/cefdinir on discharge complete antibiotic course.   Debility Progressive generalized weakness, likely age related as well as due to poor oral intake.  Currently resides at [facility].  Seen by PT and OT who recommended home health on discharge.   Cognitive impairment Family concern for increased confusion compared to baseline.  She is alert and oriented to self, place, but not year.  Otherwise appropriate. Delirium precautions Minimize the use of opioids/benzodiazepines   Chronic kidney disease, stage 3a ([place]) Renal function stable, creatinine 0.68   Chronic midline low back pain On chronic Norco 5-325 mg TID   Depression with somatization Continue Prozac 20 mg p.o. daily.   Diarrhea: C. difficile PCR: Negative  Discharge Diagnoses:  Principal Problem:   Lower lobe pneumonia Active Problems:   Debility  Depression with somatization    Chronic midline low back pain   Chronic kidney disease, stage 3a ([place])   Cognitive impairment    Discharge Instructions  Discharge Instructions     Call MD for:  difficulty breathing, headache or visual disturbances   Complete by: As directed    Call MD for:  extreme fatigue   Complete by: As directed    Call MD for:  persistant dizziness or light-headedness   Complete by: As directed    Call MD for:  persistant nausea and vomiting   Complete by: As directed    Call MD for:  severe uncontrolled pain   Complete by: As directed    Call MD for:  temperature >100.4   Complete by: As directed    Diet - low sodium heart healthy   Complete by: As directed    Increase activity slowly   Complete by: As directed       Allergies as of 04/06/2022   No Known Allergies      Medication List     STOP taking these medications    cephALEXin 500 MG capsule Commonly known as: KEFLEX   nitrofurantoin (macrocrystal-monohydrate) 100 MG capsule Commonly known as: MACROBID   oxyBUTYnin Chloride 2.5 MG Tabs       TAKE these medications    acetaminophen 500 MG tablet Commonly known as: TYLENOL Take 500 mg by mouth in the morning and at bedtime.   aspirin EC 81 MG tablet Take 81 mg by mouth daily. Swallow whole.   azithromycin 500 MG tablet Commonly known as: Zithromax Take 1 tablet (500 mg total) by mouth daily for 1 day. Take 1 tablet daily for 3 days.   cefdinir 300 MG capsule Commonly known as: OMNICEF Take 1 capsule (300 mg total) by mouth 2 (two) times daily for 3 days.   cholecalciferol 25 MCG (1000 UNIT) tablet Commonly known as: VITAMIN D3 Take 1,000 Units by mouth daily.   CRANBERRY-VITAMIN C-D MANNOSE PO Take 1 capsule by mouth daily.   DAILY VITE PO Take 1 tablet by mouth daily.   docusate sodium 100 MG capsule Commonly known as: COLACE Take 100 mg by mouth every other day.   FLUoxetine 20 MG capsule Commonly known as: PROZAC Take 20 mg by mouth  daily.   folic acid 1 MG tablet Commonly known as: FOLVITE Take 1 mg by mouth daily.   HYDROcodone-acetaminophen 5-325 MG tablet Commonly known as: NORCO/VICODIN Take 1 tablet by mouth every 6 (six) hours as needed for moderate pain. What changed:  how much to take when to take this   lidocaine 4 % Place 1 patch onto the skin daily.   pantoprazole 40 MG tablet Commonly known as: PROTONIX Take 1 tablet (40 mg total) by mouth daily before breakfast.   polyethylene glycol 17 g packet Commonly known as: MIRALAX / GLYCOLAX Take 17 g by mouth 2 (two) times a week. Tuesday's and Friday's   Ultimate Probiotic Formula Caps Take 1 capsule by mouth daily.   zolpidem 5 MG tablet Commonly known as: AMBIEN Take 5 mg by mouth at bedtime.        No Known Allergies  Consultations: None   Procedures/Studies: CT Chest Wo Contrast  Result Date: 04/02/2022 CLINICAL DATA:  Progressive weakness back pain EXAM: CT CHEST WITHOUT CONTRAST TECHNIQUE: Multidetector CT imaging of the chest was performed following the standard protocol without IV contrast. RADIATION DOSE REDUCTION: This exam was performed according to the departmental dose-optimization program  which includes automated exposure control, adjustment of the mA and/or kV according to patient size and/or use of iterative reconstruction technique. COMPARISON:  Chest x-ray 04/02/2022, radiograph 12/17/2021, CT 04/02/2022, 11/05/2021 FINDINGS: Cardiovascular: Limited evaluation without intravenous contrast. Moderate aortic atherosclerosis. No aneurysm. Coronary vascular calcification. Normal cardiac size. No pericardial effusion Mediastinum/Nodes: Midline trachea. No thyroid mass. No suspicious lymph nodes. Moderate large hiatal hernia with thickened appearing stomach in the hernia sac. Lungs/Pleura: No pleural effusion or pneumothorax. Mild diffuse septal thickening. Partial lower lobe consolidations with mild air bronchograms. Mild  ground-glass densities within the left upper lobe anteriorly. Minimal bronchiectasis in the right lower lobe. Mild subpleural reticulation. Upper Abdomen: No acute finding. Musculoskeletal: Old sternal manubrial fracture. Chronic moderate compression fracture T12. Multiple old left-sided rib fractures. IMPRESSION: 1. Partial posterior lower lobe consolidations, this may reflect atelectasis, pneumonia or aspiration. Mild heterogeneous ground-glass density in the anterior left upper lobe is suspected to be infectious in etiology. 2. Moderate hiatal hernia Aortic Atherosclerosis (ICD10-I70.0). Electronically Signed   By: Donavan Foil M.D.   On: 04/02/2022 23:50   CT Head Wo Contrast  Result Date: 04/02/2022 CLINICAL DATA:  Mental status change, unknown cause. Progressive weakness. EXAM: CT HEAD WITHOUT CONTRAST TECHNIQUE: Contiguous axial images were obtained from the base of the skull through the vertex without intravenous contrast. RADIATION DOSE REDUCTION: This exam was performed according to the departmental dose-optimization program which includes automated exposure control, adjustment of the mA and/or kV according to patient size and/or use of iterative reconstruction technique. COMPARISON:  11/08/2021. FINDINGS: Brain: No acute intracranial hemorrhage, midline shift or mass effect. No extra-axial fluid collection. Diffuse atrophy is noted. Periventricular white matter hypodensities are present bilaterally. No hydrocephalus. There is chronic asymmetry of the lateral ventricles with encephalomalacia of the caudate and/or external capsule on the right. Vascular: No hyperdense vessel or unexpected calcification. Skull: Normal. Negative for fracture or focal lesion. Sinuses/Orbits: No acute finding. Other: None. IMPRESSION: Stable head CT with no acute intracranial abnormality. Electronically Signed   By: Brett Fairy M.D.   On: 04/02/2022 23:46   DG Chest Port 1 View  Result Date: 04/02/2022 CLINICAL  DATA:  Weakness EXAM: PORTABLE CHEST 1 VIEW COMPARISON:  12/20/2021 FINDINGS: No acute airspace disease. No pleural effusion. Stable cardiomediastinal silhouette with aortic atherosclerosis and hiatal hernia. No pneumothorax. Multiple left-sided rib fractures which are new compared to prior radiograph but appear subacute to chronic. IMPRESSION: No active disease.  Hiatal hernia Electronically Signed   By: Donavan Foil M.D.   On: 04/02/2022 22:54   CT Renal Stone Study  Result Date: 04/02/2022 CLINICAL DATA:  Flank pain EXAM: CT ABDOMEN AND PELVIS WITHOUT CONTRAST TECHNIQUE: Multidetector CT imaging of the abdomen and pelvis was performed following the standard protocol without IV contrast. RADIATION DOSE REDUCTION: This exam was performed according to the departmental dose-optimization program which includes automated exposure control, adjustment of the mA and/or kV according to patient size and/or use of iterative reconstruction technique. COMPARISON:  11/05/2021 FINDINGS: Lower chest: Coronary artery calcifications are seen. There is large fixed hiatal hernia. There are new patchy infiltrates in posterior lower lung fields on both sides. Left hemidiaphragm is elevated. Small bilateral pleural effusions are seen. Hepatobiliary: There is 12 mm low-density in the right lobe with no significant change, possibly cysts or hemangiomas. There is no dilation of bile ducts. Gallbladder is not distended. Pancreas: No focal abnormalities are seen. Spleen: Coarse calcification is seen in the spleen. Spleen is not enlarged. Adrenals/Urinary Tract: Adrenals are  unremarkable. There is no hydronephrosis. There are bilateral renal cysts largest in the upper pole of left kidney measuring 5.3 cm. There are no renal or ureteral stones. Evaluation of bladder is limited by beam hardening artifacts. Stomach/Bowel: Large fixed hiatal hernia is seen. Stomach is unremarkable. Small bowel loops are not dilated. Appendix is not seen.  There is no pericecal inflammation. Vascular/Lymphatic: Extensive arterial calcifications are seen. Reproductive: Uterus is not seen. Beam hardening artifacts caused by surgical hardware in left hip limit evaluation of lower pelvis. Other: There is no ascites or pneumoperitoneum. Musculoskeletal: There is left hip arthroplasty. Degenerative changes are noted in lumbar spine. There is decrease in height of bodies of T12 and L3 vertebrae with no significant interval change. There is S shaped scoliosis in lumbar spine. IMPRESSION: There is no evidence of intestinal obstruction or pneumoperitoneum. There is no hydronephrosis. Coronary artery disease. Large fixed hiatal hernia. There are new patchy infiltrates in the lower lung fields, more so on the right side suggesting atelectasis/pneumonia. Minimal bilateral pleural effusions are seen. There is decrease in height of bodies of T12 and L3 vertebrae suggesting old compression fractures. Other findings as described in the body of the report. Electronically Signed   By: Elmer Picker M.D.   On: 04/02/2022 17:45     Subjective: Patient seen examined bedside, resting calmly.  No complaints this morning.  Discharging back to ALF.  Denies headache, no visual changes, no chest pain, no palpitations, no fever/chills/night sweats, no nausea/vomiting/diarrhea, no shortness of breath, no abdominal pain, no focal weakness, no fatigue, no paresthesias.  No acute events overnight per nursing staff.  Discharge Exam: Vitals:   04/05/22 1957 04/06/22 0418  BP: (!) 155/69 (!) 175/60  Pulse: 85 77  Resp: 16 20  Temp: 98.2 F (36.8 C) 98.1 F (36.7 C)  SpO2: 97% 96%   Vitals:   04/05/22 0440 04/05/22 1520 04/05/22 1957 04/06/22 0418  BP: (!) 158/64 (!) 156/72 (!) 155/69 (!) 175/60  Pulse: 79 64 85 77  Resp: 18 18 16 20   Temp: 98.2 F (36.8 C) 98.1 F (36.7 C) 98.2 F (36.8 C) 98.1 F (36.7 C)  TempSrc: Oral Oral Oral Oral  SpO2: 92% 100% 97% 96%  Weight:       Height:        Physical Exam: GEN: NAD, alert, chronically ill/elderly in appearance HEENT: NCAT, PERRL, EOMI, sclera clear, MMM PULM: CTAB w/o wheezes/crackles, normal respiratory effort, on room air CV: RRR w/o M/G/R GI: abd soft, NTND, NABS, no R/G/M MSK: no peripheral edema, muscle strength globally intact 5/5 bilateral upper/lower extremities NEURO: CN II-XII intact, no focal deficits, sensation to light touch intact PSYCH: normal mood/affect Integumentary: dry/intact, no rashes or wounds    The results of significant diagnostics from this hospitalization (including imaging, microbiology, ancillary and laboratory) are listed below for reference.     Microbiology: Recent Results (from the past 240 hour(s))  SARS Coronavirus 2 by RT PCR (hospital order, performed in [facility] hospital lab) *cepheid single result test* Anterior Nasal Swab     Status: None   Collection Time: 03/31/22  6:11 PM   Specimen: Anterior Nasal Swab  Result Value Ref Range Status   SARS Coronavirus 2 by RT PCR NEGATIVE NEGATIVE Final    Comment: (NOTE) SARS-CoV-2 target nucleic acids are NOT DETECTED.  The SARS-CoV-2 RNA is generally detectable in upper and lower respiratory specimens during the acute phase of infection. The lowest concentration of SARS-CoV-2 viral copies this assay  can detect is 250 copies / mL. A negative result does not preclude SARS-CoV-2 infection and should not be used as the sole basis for treatment or other patient management decisions.  A negative result may occur with improper specimen collection / handling, submission of specimen other than nasopharyngeal swab, presence of viral mutation(s) within the areas targeted by this assay, and inadequate number of viral copies (<250 copies / mL). A negative result must be combined with clinical observations, patient history, and epidemiological information.  Fact Sheet for Patients:    https://www.patel.info/  Fact Sheet for Healthcare Providers: https://hall.com/  This test is not yet approved or  cleared by the [place] FDA and has been authorized for detection and/or diagnosis of SARS-CoV-2 by FDA under an Emergency Use Authorization (EUA).  This EUA will remain in effect (meaning this test can be used) for the duration of the COVID-19 declaration under Section 564(b)(1) of the Act, 21 U.S.C. section 360bbb-3(b)(1), unless the authorization is terminated or revoked sooner.  Performed at [facility], [address] [place], [place] [address]   SARS Coronavirus 2 by RT PCR (hospital order, performed in [facility] hospital lab) *cepheid single result test* Anterior Nasal Swab     Status: None   Collection Time: 04/02/22  5:01 PM   Specimen: Anterior Nasal Swab  Result Value Ref Range Status   SARS Coronavirus 2 by RT PCR NEGATIVE NEGATIVE Final    Comment: (NOTE) SARS-CoV-2 target nucleic acids are NOT DETECTED.  The SARS-CoV-2 RNA is generally detectable in upper and lower respiratory specimens during the acute phase of infection. The lowest concentration of SARS-CoV-2 viral copies this assay can detect is 250 copies / mL. A negative result does not preclude SARS-CoV-2 infection and should not be used as the sole basis for treatment or other patient management decisions.  A negative result may occur with improper specimen collection / handling, submission of specimen other than nasopharyngeal swab, presence of viral mutation(s) within the areas targeted by this assay, and inadequate number of viral copies (<250 copies / mL). A negative result must be combined with clinical observations, patient history, and epidemiological information.  Fact Sheet for Patients:   https://www.patel.info/  Fact Sheet for Healthcare  Providers: https://hall.com/  This test is not yet approved or  cleared by the [place] FDA and has been authorized for detection and/or diagnosis of SARS-CoV-2 by FDA under an Emergency Use Authorization (EUA).  This EUA will remain in effect (meaning this test can be used) for the duration of the COVID-19 declaration under Section 564(b)(1) of the Act, 21 U.S.C. section 360bbb-3(b)(1), unless the authorization is terminated or revoked sooner.  Performed at [facility], [place] [address] [place], [place] [address]   C Difficile Quick Screen w PCR reflex     Status: None   Collection Time: 04/03/22  6:20 PM   Specimen: STOOL  Result Value Ref Range Status   C Diff antigen NEGATIVE NEGATIVE Final   C Diff toxin NEGATIVE NEGATIVE Final   C Diff interpretation No C. difficile detected.  Final    Comment: Performed at [facility], [place] [address], [place], [place] [address]     Labs: BNP (last 3 results) Recent Labs    12/20/21 0317  BNP [id number]*   Basic Metabolic Panel: Recent Labs  Lab 03/31/22 1444 04/02/22 1758 04/03/22 0508 04/04/22 0530  NA 136 138 135 139  K 4.1 3.6 3.5 3.5  CL 106 113*  115* 118*  CO2 19* 18* 18* 18*  GLUCOSE 103* 127* 96 88  BUN 37* 26* 23 18  CREATININE 1.20* 1.05* 0.91 0.68  CALCIUM 10.4* 10.1 8.9 8.9  MG 2.0  --   --  1.8  PHOS  --   --   --  2.4*   Liver Function Tests: Recent Labs  Lab 04/02/22 1758  AST 54*  ALT 31  ALKPHOS 110  BILITOT 0.4  PROT 6.5  ALBUMIN 2.7*   Recent Labs  Lab 04/02/22 1758  LIPASE 33   No results for input(s): "AMMONIA" in the last 168 hours. CBC: Recent Labs  Lab 03/31/22 1444 04/02/22 1758 04/03/22 0508  WBC 11.8* 8.4 6.4  NEUTROABS  --  5.7  --   HGB 11.7* 11.6* 9.5*  HCT 36.5 36.5 29.7*  MCV 91.5 92.2 92.0  PLT 275 267 218   Cardiac Enzymes: Recent Labs  Lab 03/31/22 1444  CKTOTAL 23*   BNP: Invalid input(s):  "POCBNP" CBG: Recent Labs  Lab 03/31/22 1541  GLUCAP 87   D-Dimer No results for input(s): "DDIMER" in the last 72 hours. Hgb A1c No results for input(s): "HGBA1C" in the last 72 hours. Lipid Profile No results for input(s): "CHOL", "HDL", "LDLCALC", "TRIG", "CHOLHDL", "LDLDIRECT" in the last 72 hours. Thyroid function studies No results for input(s): "TSH", "T4TOTAL", "T3FREE", "THYROIDAB" in the last 72 hours.  Invalid input(s): "FREET3" Anemia work up No results for input(s): "VITAMINB12", "FOLATE", "FERRITIN", "TIBC", "IRON", "RETICCTPCT" in the last 72 hours. Urinalysis    Component Value Date/Time   COLORURINE AMBER (A) 04/02/2022 1939   APPEARANCEUR HAZY (A) 04/02/2022 1939   LABSPEC 1.019 04/02/2022 1939   PHURINE 5.0 04/02/2022 1939   GLUCOSEU NEGATIVE 04/02/2022 1939   GLUCOSEU NEGATIVE 02/01/2017 1212   HGBUR NEGATIVE 04/02/2022 1939   BILIRUBINUR NEGATIVE 04/02/2022 1939   KETONESUR 5 (A) 04/02/2022 1939   PROTEINUR NEGATIVE 04/02/2022 1939   UROBILINOGEN 1.0 02/01/2017 1212   NITRITE NEGATIVE 04/02/2022 1939   LEUKOCYTESUR NEGATIVE 04/02/2022 1939   Sepsis Labs Recent Labs  Lab 03/31/22 1444 04/02/22 1758 04/03/22 0508  WBC 11.8* 8.4 6.4   Microbiology Recent Results (from the past 240 hour(s))  SARS Coronavirus 2 by RT PCR (hospital order, performed in [place] [facility] hospital lab) *cepheid single result test* Anterior Nasal Swab     Status: None   Collection Time: 03/31/22  6:11 PM   Specimen: Anterior Nasal Swab  Result Value Ref Range Status   SARS Coronavirus 2 by RT PCR NEGATIVE NEGATIVE Final    Comment: (NOTE) SARS-CoV-2 target nucleic acids are NOT DETECTED.  The SARS-CoV-2 RNA is generally detectable in upper and lower respiratory specimens during the acute phase of infection. The lowest concentration of SARS-CoV-2 viral copies this assay can detect is 250 copies / mL. A negative result does not preclude SARS-CoV-2 infection and should not  be used as the sole basis for treatment or other patient management decisions.  A negative result may occur with improper specimen collection / handling, submission of specimen other than nasopharyngeal swab, presence of viral mutation(s) within the areas targeted by this assay, and inadequate number of viral copies (<250 copies / mL). A negative result must be combined with clinical observations, patient history, and epidemiological information.  Fact Sheet for Patients:   https://www.patel.info/  Fact Sheet for Healthcare Providers: https://hall.com/  This test is not yet approved or  cleared by the [place] FDA and has been authorized for detection and/or  diagnosis of SARS-CoV-2 by FDA under an Emergency Use Authorization (EUA).  This EUA will remain in effect (meaning this test can be used) for the duration of the COVID-19 declaration under Section 564(b)(1) of the Act, 21 U.S.C. section 360bbb-3(b)(1), unless the authorization is terminated or revoked sooner.  Performed at [facility], [address] [place], [place] [address]   SARS Coronavirus 2 by RT PCR (hospital order, performed in [facility] hospital lab) *cepheid single result test* Anterior Nasal Swab     Status: None   Collection Time: 04/02/22  5:01 PM   Specimen: Anterior Nasal Swab  Result Value Ref Range Status   SARS Coronavirus 2 by RT PCR NEGATIVE NEGATIVE Final    Comment: (NOTE) SARS-CoV-2 target nucleic acids are NOT DETECTED.  The SARS-CoV-2 RNA is generally detectable in upper and lower respiratory specimens during the acute phase of infection. The lowest concentration of SARS-CoV-2 viral copies this assay can detect is 250 copies / mL. A negative result does not preclude SARS-CoV-2 infection and should not be used as the sole basis for treatment or other patient management decisions.  A negative result may occur with improper  specimen collection / handling, submission of specimen other than nasopharyngeal swab, presence of viral mutation(s) within the areas targeted by this assay, and inadequate number of viral copies (<250 copies / mL). A negative result must be combined with clinical observations, patient history, and epidemiological information.  Fact Sheet for Patients:   https://www.patel.info/  Fact Sheet for Healthcare Providers: https://hall.com/  This test is not yet approved or  cleared by the [place] FDA and has been authorized for detection and/or diagnosis of SARS-CoV-2 by FDA under an Emergency Use Authorization (EUA).  This EUA will remain in effect (meaning this test can be used) for the duration of the COVID-19 declaration under Section 564(b)(1) of the Act, 21 U.S.C. section 360bbb-3(b)(1), unless the authorization is terminated or revoked sooner.  Performed at [facility], [place] [address] [place], [place] [address]   C Difficile Quick Screen w PCR reflex     Status: None   Collection Time: 04/03/22  6:20 PM   Specimen: STOOL  Result Value Ref Range Status   C Diff antigen NEGATIVE NEGATIVE Final   C Diff toxin NEGATIVE NEGATIVE Final   C Diff interpretation No C. difficile detected.  Final    Comment: Performed at [facility] [facility], [place] [address], [place], [place] [address]     Time coordinating discharge: Over 30 minutes  SIGNED:   Mubarak Bevens J [place] DO  [facility] 04/06/2022, 10:04 AM

## 2022-04-06 NOTE — Progress Notes (Signed)
Occupational Therapy Treatment Patient Details Name: Evelyn [name] MRN: [id number] DOB: 17-Mar-1925 Today's Date: 04/06/2022   History of present illness 88 y.o. 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   OT comments  Patient participated in toileting tasks with increased time and min guard for standing balance to participate in hygiene tasks at commode with education continuous about wiping from front to back and importance of hygiene tasks. Patient verbalized understanding but unable to carryover education. Patient would continue to benefit from skilled OT services at this time while admitted and after d/c to address noted deficits in order to improve overall safety and independence in ADLs.     Recommendations for follow up therapy are one component of a multi-disciplinary discharge planning process, led by the attending physician.  Recommendations may be updated based on patient status, additional functional criteria and insurance authorization.    Follow Up Recommendations  Home health OT    Assistance Recommended at Discharge Frequent or constant Supervision/Assistance  Patient can return home with the following  A little help with walking and/or transfers;A little help with bathing/dressing/bathroom;Direct supervision/assist for financial management;Assist for transportation;Direct supervision/assist for medications management;Help with stairs or ramp for entrance;Assistance with cooking/housework   Equipment Recommendations       Recommendations for Other Services      Precautions / Restrictions Precautions Precautions: Fall Restrictions Weight Bearing Restrictions: No       Mobility Bed Mobility Overal bed  mobility: Modified Independent                  Transfers Overall transfer level: Needs assistance Equipment used: Rolling walker (2 wheels) Transfers: Sit to/from Stand Sit to Stand: Min guard           General transfer comment: slow transition, but min/guard for safety     Balance                                           ADL either performed or assessed with clinical judgement   ADL Overall ADL's : Needs assistance/impaired                         Toilet Transfer: Min guard;Ambulation;Rolling walker (2 wheels) Toilet Transfer Details (indicate cue type and reason): with cues for safety and sequencing of sitting down on commode Toileting- Clothing Manipulation and Hygiene: Minimal assistance Toileting - Clothing Manipulation Details (indicate cue type and reason): to complete pulling up undergarments and to complete hygiene tasks with continued education on wiping front to back.       General ADL Comments: patietn was positioend in chair at end of session with patient self feeding after set up.    Extremity/Trunk Assessment              Vision       Perception     Praxis      Cognition Arousal/Alertness: Awake/alert Behavior During Therapy: WFL for tasks assessed/performed Overall Cognitive Status: No family/caregiver present to determine baseline cognitive functioning  General Comments: short term memory and safety issues but appropirate during session        Exercises      Shoulder Instructions       General Comments      Pertinent Vitals/ Pain       Pain Assessment Pain Assessment: No/denies pain  Home Living                                          Prior Functioning/Environment              Frequency  Min 2X/week        Progress Toward Goals  OT Goals(current goals can now be found in the care plan section)  Progress towards  OT goals: Progressing toward goals     Plan Discharge plan remains appropriate    Co-evaluation                 AM-PAC OT "6 Clicks" Daily Activity     Outcome Measure   Help from another person eating meals?: A Little Help from another person taking care of personal grooming?: A Little Help from another person toileting, which includes using toliet, bedpan, or urinal?: A Little Help from another person bathing (including washing, rinsing, drying)?: A Little Help from another person to put on and taking off regular upper body clothing?: A Little Help from another person to put on and taking off regular lower body clothing?: A Little 6 Click Score: 18    End of Session Equipment Utilized During Treatment: Rolling walker (2 wheels)  OT Visit Diagnosis: Unsteadiness on feet (R26.81);Other abnormalities of gait and mobility (R26.89);Muscle weakness (generalized) (M62.81);Other symptoms and signs involving cognitive function   Activity Tolerance Patient tolerated treatment well   Patient Left in chair;with call bell/phone within reach;with family/visitor present;with nursing/sitter in room   Nurse Communication Mobility status        Time: 1130-1146 OT Time Calculation (min): 16 min  Charges: OT General Charges $OT Visit: 1 Visit OT Treatments $Self Care/Home Management : 8-22 mins  Rennie Plowman, MS Acute Rehabilitation Department Office# [phone]   Marcellina Millin 04/06/2022, 1:51 PM

## 2022-04-06 NOTE — Care Management Important Message (Signed)
Important Message  Patient Details IM Letter given Name: ARIDAY BRINKER MRN: [id number] Date of Birth: Apr 26, 1925   Medicare Important Message Given:  Yes     Kerin Salen 04/06/2022, 10:51 AM

## 2022-04-08 DIAGNOSIS — R197 Diarrhea, unspecified: Secondary | ICD-10-CM | POA: Diagnosis not present

## 2022-04-08 DIAGNOSIS — N390 Urinary tract infection, site not specified: Secondary | ICD-10-CM | POA: Diagnosis not present

## 2022-04-08 DIAGNOSIS — R54 Age-related physical debility: Secondary | ICD-10-CM | POA: Diagnosis not present

## 2022-04-08 DIAGNOSIS — R4189 Other symptoms and signs involving cognitive functions and awareness: Secondary | ICD-10-CM | POA: Diagnosis not present

## 2022-04-09 DIAGNOSIS — N1831 Chronic kidney disease, stage 3a: Secondary | ICD-10-CM | POA: Diagnosis not present

## 2022-04-09 DIAGNOSIS — Z90710 Acquired absence of both cervix and uterus: Secondary | ICD-10-CM | POA: Diagnosis not present

## 2022-04-09 DIAGNOSIS — F450 Somatization disorder: Secondary | ICD-10-CM | POA: Diagnosis not present

## 2022-04-09 DIAGNOSIS — Z87440 Personal history of urinary (tract) infections: Secondary | ICD-10-CM | POA: Diagnosis not present

## 2022-04-09 DIAGNOSIS — K2970 Gastritis, unspecified, without bleeding: Secondary | ICD-10-CM | POA: Diagnosis not present

## 2022-04-09 DIAGNOSIS — E440 Moderate protein-calorie malnutrition: Secondary | ICD-10-CM | POA: Diagnosis not present

## 2022-04-09 DIAGNOSIS — I700 Atherosclerosis of aorta: Secondary | ICD-10-CM | POA: Diagnosis not present

## 2022-04-09 DIAGNOSIS — G4700 Insomnia, unspecified: Secondary | ICD-10-CM | POA: Diagnosis not present

## 2022-04-09 DIAGNOSIS — Z96642 Presence of left artificial hip joint: Secondary | ICD-10-CM | POA: Diagnosis not present

## 2022-04-09 DIAGNOSIS — K449 Diaphragmatic hernia without obstruction or gangrene: Secondary | ICD-10-CM | POA: Diagnosis not present

## 2022-04-09 DIAGNOSIS — M545 Low back pain, unspecified: Secondary | ICD-10-CM | POA: Diagnosis not present

## 2022-04-09 DIAGNOSIS — E785 Hyperlipidemia, unspecified: Secondary | ICD-10-CM | POA: Diagnosis not present

## 2022-04-09 DIAGNOSIS — Z7982 Long term (current) use of aspirin: Secondary | ICD-10-CM | POA: Diagnosis not present

## 2022-04-09 DIAGNOSIS — J470 Bronchiectasis with acute lower respiratory infection: Secondary | ICD-10-CM | POA: Diagnosis not present

## 2022-04-09 DIAGNOSIS — I129 Hypertensive chronic kidney disease with stage 1 through stage 4 chronic kidney disease, or unspecified chronic kidney disease: Secondary | ICD-10-CM | POA: Diagnosis not present

## 2022-04-09 DIAGNOSIS — M47816 Spondylosis without myelopathy or radiculopathy, lumbar region: Secondary | ICD-10-CM | POA: Diagnosis not present

## 2022-04-09 DIAGNOSIS — K581 Irritable bowel syndrome with constipation: Secondary | ICD-10-CM | POA: Diagnosis not present

## 2022-04-09 DIAGNOSIS — J189 Pneumonia, unspecified organism: Secondary | ICD-10-CM | POA: Diagnosis not present

## 2022-04-09 DIAGNOSIS — F32A Depression, unspecified: Secondary | ICD-10-CM | POA: Diagnosis not present

## 2022-04-09 DIAGNOSIS — G8929 Other chronic pain: Secondary | ICD-10-CM | POA: Diagnosis not present

## 2022-04-09 DIAGNOSIS — D631 Anemia in chronic kidney disease: Secondary | ICD-10-CM | POA: Diagnosis not present

## 2022-04-09 DIAGNOSIS — H5461 Unqualified visual loss, right eye, normal vision left eye: Secondary | ICD-10-CM | POA: Diagnosis not present

## 2022-04-09 DIAGNOSIS — I2510 Atherosclerotic heart disease of native coronary artery without angina pectoris: Secondary | ICD-10-CM | POA: Diagnosis not present

## 2022-04-09 DIAGNOSIS — M419 Scoliosis, unspecified: Secondary | ICD-10-CM | POA: Diagnosis not present

## 2022-04-09 DIAGNOSIS — Z961 Presence of intraocular lens: Secondary | ICD-10-CM | POA: Diagnosis not present

## 2022-04-11 DIAGNOSIS — J189 Pneumonia, unspecified organism: Secondary | ICD-10-CM | POA: Diagnosis not present

## 2022-04-11 DIAGNOSIS — I2510 Atherosclerotic heart disease of native coronary artery without angina pectoris: Secondary | ICD-10-CM | POA: Diagnosis not present

## 2022-04-11 DIAGNOSIS — N1831 Chronic kidney disease, stage 3a: Secondary | ICD-10-CM | POA: Diagnosis not present

## 2022-04-11 DIAGNOSIS — D631 Anemia in chronic kidney disease: Secondary | ICD-10-CM | POA: Diagnosis not present

## 2022-04-11 DIAGNOSIS — J470 Bronchiectasis with acute lower respiratory infection: Secondary | ICD-10-CM | POA: Diagnosis not present

## 2022-04-11 DIAGNOSIS — I129 Hypertensive chronic kidney disease with stage 1 through stage 4 chronic kidney disease, or unspecified chronic kidney disease: Secondary | ICD-10-CM | POA: Diagnosis not present

## 2022-04-12 DIAGNOSIS — R0782 Intercostal pain: Secondary | ICD-10-CM | POA: Diagnosis not present

## 2022-04-12 DIAGNOSIS — W19XXXA Unspecified fall, initial encounter: Secondary | ICD-10-CM | POA: Diagnosis not present

## 2022-04-12 DIAGNOSIS — R0602 Shortness of breath: Secondary | ICD-10-CM | POA: Diagnosis not present

## 2022-04-12 DIAGNOSIS — J189 Pneumonia, unspecified organism: Secondary | ICD-10-CM | POA: Diagnosis not present

## 2022-04-12 DIAGNOSIS — R54 Age-related physical debility: Secondary | ICD-10-CM | POA: Diagnosis not present

## 2022-04-13 DIAGNOSIS — Z23 Encounter for immunization: Secondary | ICD-10-CM | POA: Diagnosis not present

## 2022-04-14 DIAGNOSIS — J470 Bronchiectasis with acute lower respiratory infection: Secondary | ICD-10-CM | POA: Diagnosis not present

## 2022-04-14 DIAGNOSIS — J189 Pneumonia, unspecified organism: Secondary | ICD-10-CM | POA: Diagnosis not present

## 2022-04-14 DIAGNOSIS — D631 Anemia in chronic kidney disease: Secondary | ICD-10-CM | POA: Diagnosis not present

## 2022-04-14 DIAGNOSIS — I2510 Atherosclerotic heart disease of native coronary artery without angina pectoris: Secondary | ICD-10-CM | POA: Diagnosis not present

## 2022-04-14 DIAGNOSIS — N1831 Chronic kidney disease, stage 3a: Secondary | ICD-10-CM | POA: Diagnosis not present

## 2022-04-14 DIAGNOSIS — I129 Hypertensive chronic kidney disease with stage 1 through stage 4 chronic kidney disease, or unspecified chronic kidney disease: Secondary | ICD-10-CM | POA: Diagnosis not present

## 2022-04-16 DIAGNOSIS — F331 Major depressive disorder, recurrent, moderate: Secondary | ICD-10-CM | POA: Diagnosis not present

## 2022-04-16 DIAGNOSIS — G4700 Insomnia, unspecified: Secondary | ICD-10-CM | POA: Diagnosis not present

## 2022-04-20 DIAGNOSIS — R54 Age-related physical debility: Secondary | ICD-10-CM | POA: Diagnosis not present

## 2022-04-20 DIAGNOSIS — G894 Chronic pain syndrome: Secondary | ICD-10-CM | POA: Diagnosis not present

## 2022-04-21 DIAGNOSIS — J189 Pneumonia, unspecified organism: Secondary | ICD-10-CM | POA: Diagnosis not present

## 2022-04-21 DIAGNOSIS — D631 Anemia in chronic kidney disease: Secondary | ICD-10-CM | POA: Diagnosis not present

## 2022-04-21 DIAGNOSIS — I2510 Atherosclerotic heart disease of native coronary artery without angina pectoris: Secondary | ICD-10-CM | POA: Diagnosis not present

## 2022-04-21 DIAGNOSIS — N1831 Chronic kidney disease, stage 3a: Secondary | ICD-10-CM | POA: Diagnosis not present

## 2022-04-21 DIAGNOSIS — J470 Bronchiectasis with acute lower respiratory infection: Secondary | ICD-10-CM | POA: Diagnosis not present

## 2022-04-21 DIAGNOSIS — I129 Hypertensive chronic kidney disease with stage 1 through stage 4 chronic kidney disease, or unspecified chronic kidney disease: Secondary | ICD-10-CM | POA: Diagnosis not present

## 2022-04-22 DIAGNOSIS — R1310 Dysphagia, unspecified: Secondary | ICD-10-CM | POA: Diagnosis not present

## 2022-04-22 DIAGNOSIS — S2241XA Multiple fractures of ribs, right side, initial encounter for closed fracture: Secondary | ICD-10-CM | POA: Diagnosis not present

## 2022-04-26 DIAGNOSIS — M545 Low back pain, unspecified: Secondary | ICD-10-CM | POA: Diagnosis not present

## 2022-04-26 DIAGNOSIS — G894 Chronic pain syndrome: Secondary | ICD-10-CM | POA: Diagnosis not present

## 2022-04-27 DIAGNOSIS — B351 Tinea unguium: Secondary | ICD-10-CM | POA: Diagnosis not present

## 2022-04-27 DIAGNOSIS — I739 Peripheral vascular disease, unspecified: Secondary | ICD-10-CM | POA: Diagnosis not present

## 2022-04-27 DIAGNOSIS — M2041 Other hammer toe(s) (acquired), right foot: Secondary | ICD-10-CM | POA: Diagnosis not present

## 2022-04-29 DIAGNOSIS — Z9181 History of falling: Secondary | ICD-10-CM | POA: Diagnosis not present

## 2022-04-29 DIAGNOSIS — M6281 Muscle weakness (generalized): Secondary | ICD-10-CM | POA: Diagnosis not present

## 2022-04-29 DIAGNOSIS — R2681 Unsteadiness on feet: Secondary | ICD-10-CM | POA: Diagnosis not present

## 2022-05-01 DIAGNOSIS — M6281 Muscle weakness (generalized): Secondary | ICD-10-CM | POA: Diagnosis not present

## 2022-05-02 DIAGNOSIS — I129 Hypertensive chronic kidney disease with stage 1 through stage 4 chronic kidney disease, or unspecified chronic kidney disease: Secondary | ICD-10-CM | POA: Diagnosis not present

## 2022-05-02 DIAGNOSIS — J470 Bronchiectasis with acute lower respiratory infection: Secondary | ICD-10-CM | POA: Diagnosis not present

## 2022-05-02 DIAGNOSIS — J189 Pneumonia, unspecified organism: Secondary | ICD-10-CM | POA: Diagnosis not present

## 2022-05-04 DIAGNOSIS — R2681 Unsteadiness on feet: Secondary | ICD-10-CM | POA: Diagnosis not present

## 2022-05-04 DIAGNOSIS — Z9181 History of falling: Secondary | ICD-10-CM | POA: Diagnosis not present

## 2022-05-04 DIAGNOSIS — M6281 Muscle weakness (generalized): Secondary | ICD-10-CM | POA: Diagnosis not present

## 2022-05-05 DIAGNOSIS — M6281 Muscle weakness (generalized): Secondary | ICD-10-CM | POA: Diagnosis not present

## 2022-05-05 DIAGNOSIS — Z9181 History of falling: Secondary | ICD-10-CM | POA: Diagnosis not present

## 2022-05-05 DIAGNOSIS — R2681 Unsteadiness on feet: Secondary | ICD-10-CM | POA: Diagnosis not present

## 2022-05-06 DIAGNOSIS — M6281 Muscle weakness (generalized): Secondary | ICD-10-CM | POA: Diagnosis not present

## 2022-05-07 DIAGNOSIS — M6281 Muscle weakness (generalized): Secondary | ICD-10-CM | POA: Diagnosis not present

## 2022-05-09 DIAGNOSIS — R2681 Unsteadiness on feet: Secondary | ICD-10-CM | POA: Diagnosis not present

## 2022-05-09 DIAGNOSIS — M6281 Muscle weakness (generalized): Secondary | ICD-10-CM | POA: Diagnosis not present

## 2022-05-09 DIAGNOSIS — Z9181 History of falling: Secondary | ICD-10-CM | POA: Diagnosis not present

## 2022-05-10 DIAGNOSIS — E785 Hyperlipidemia, unspecified: Secondary | ICD-10-CM | POA: Diagnosis not present

## 2022-05-10 DIAGNOSIS — R2681 Unsteadiness on feet: Secondary | ICD-10-CM | POA: Diagnosis not present

## 2022-05-10 DIAGNOSIS — E559 Vitamin D deficiency, unspecified: Secondary | ICD-10-CM | POA: Diagnosis not present

## 2022-05-10 DIAGNOSIS — Z9181 History of falling: Secondary | ICD-10-CM | POA: Diagnosis not present

## 2022-05-10 DIAGNOSIS — G894 Chronic pain syndrome: Secondary | ICD-10-CM | POA: Diagnosis not present

## 2022-05-10 DIAGNOSIS — M6281 Muscle weakness (generalized): Secondary | ICD-10-CM | POA: Diagnosis not present

## 2022-05-10 DIAGNOSIS — D649 Anemia, unspecified: Secondary | ICD-10-CM | POA: Diagnosis not present

## 2022-05-11 DIAGNOSIS — M6281 Muscle weakness (generalized): Secondary | ICD-10-CM | POA: Diagnosis not present

## 2022-05-12 DIAGNOSIS — M6281 Muscle weakness (generalized): Secondary | ICD-10-CM | POA: Diagnosis not present

## 2022-05-12 DIAGNOSIS — R2681 Unsteadiness on feet: Secondary | ICD-10-CM | POA: Diagnosis not present

## 2022-05-12 DIAGNOSIS — Z9181 History of falling: Secondary | ICD-10-CM | POA: Diagnosis not present

## 2022-05-14 DIAGNOSIS — M6281 Muscle weakness (generalized): Secondary | ICD-10-CM | POA: Diagnosis not present

## 2022-05-14 DIAGNOSIS — R2681 Unsteadiness on feet: Secondary | ICD-10-CM | POA: Diagnosis not present

## 2022-05-14 DIAGNOSIS — Z9181 History of falling: Secondary | ICD-10-CM | POA: Diagnosis not present

## 2022-05-17 DIAGNOSIS — Z9181 History of falling: Secondary | ICD-10-CM | POA: Diagnosis not present

## 2022-05-17 DIAGNOSIS — R2681 Unsteadiness on feet: Secondary | ICD-10-CM | POA: Diagnosis not present

## 2022-05-17 DIAGNOSIS — M6281 Muscle weakness (generalized): Secondary | ICD-10-CM | POA: Diagnosis not present

## 2022-05-19 DIAGNOSIS — M6281 Muscle weakness (generalized): Secondary | ICD-10-CM | POA: Diagnosis not present

## 2022-05-20 DIAGNOSIS — M6281 Muscle weakness (generalized): Secondary | ICD-10-CM | POA: Diagnosis not present

## 2022-05-20 DIAGNOSIS — E039 Hypothyroidism, unspecified: Secondary | ICD-10-CM | POA: Diagnosis not present

## 2022-05-20 DIAGNOSIS — Z9181 History of falling: Secondary | ICD-10-CM | POA: Diagnosis not present

## 2022-05-20 DIAGNOSIS — E559 Vitamin D deficiency, unspecified: Secondary | ICD-10-CM | POA: Diagnosis not present

## 2022-05-20 DIAGNOSIS — I10 Essential (primary) hypertension: Secondary | ICD-10-CM | POA: Diagnosis not present

## 2022-05-20 DIAGNOSIS — E782 Mixed hyperlipidemia: Secondary | ICD-10-CM | POA: Diagnosis not present

## 2022-05-20 DIAGNOSIS — E119 Type 2 diabetes mellitus without complications: Secondary | ICD-10-CM | POA: Diagnosis not present

## 2022-05-20 DIAGNOSIS — I129 Hypertensive chronic kidney disease with stage 1 through stage 4 chronic kidney disease, or unspecified chronic kidney disease: Secondary | ICD-10-CM | POA: Diagnosis not present

## 2022-05-20 DIAGNOSIS — L989 Disorder of the skin and subcutaneous tissue, unspecified: Secondary | ICD-10-CM | POA: Diagnosis not present

## 2022-05-20 DIAGNOSIS — D519 Vitamin B12 deficiency anemia, unspecified: Secondary | ICD-10-CM | POA: Diagnosis not present

## 2022-05-20 DIAGNOSIS — J470 Bronchiectasis with acute lower respiratory infection: Secondary | ICD-10-CM | POA: Diagnosis not present

## 2022-05-20 DIAGNOSIS — R2681 Unsteadiness on feet: Secondary | ICD-10-CM | POA: Diagnosis not present

## 2022-05-21 DIAGNOSIS — M6281 Muscle weakness (generalized): Secondary | ICD-10-CM | POA: Diagnosis not present

## 2022-05-21 DIAGNOSIS — Z23 Encounter for immunization: Secondary | ICD-10-CM | POA: Diagnosis not present

## 2022-05-22 DIAGNOSIS — M6281 Muscle weakness (generalized): Secondary | ICD-10-CM | POA: Diagnosis not present

## 2022-05-23 DIAGNOSIS — M6281 Muscle weakness (generalized): Secondary | ICD-10-CM | POA: Diagnosis not present

## 2022-05-23 DIAGNOSIS — Z9181 History of falling: Secondary | ICD-10-CM | POA: Diagnosis not present

## 2022-05-23 DIAGNOSIS — R2681 Unsteadiness on feet: Secondary | ICD-10-CM | POA: Diagnosis not present

## 2022-05-24 DIAGNOSIS — G894 Chronic pain syndrome: Secondary | ICD-10-CM | POA: Diagnosis not present

## 2022-05-24 DIAGNOSIS — B379 Candidiasis, unspecified: Secondary | ICD-10-CM | POA: Diagnosis not present

## 2022-05-25 DIAGNOSIS — M6281 Muscle weakness (generalized): Secondary | ICD-10-CM | POA: Diagnosis not present

## 2022-05-26 DIAGNOSIS — M6281 Muscle weakness (generalized): Secondary | ICD-10-CM | POA: Diagnosis not present

## 2022-05-26 DIAGNOSIS — Z9181 History of falling: Secondary | ICD-10-CM | POA: Diagnosis not present

## 2022-05-26 DIAGNOSIS — R2681 Unsteadiness on feet: Secondary | ICD-10-CM | POA: Diagnosis not present

## 2022-05-27 DIAGNOSIS — M6281 Muscle weakness (generalized): Secondary | ICD-10-CM | POA: Diagnosis not present

## 2022-05-27 DIAGNOSIS — R2681 Unsteadiness on feet: Secondary | ICD-10-CM | POA: Diagnosis not present

## 2022-05-27 DIAGNOSIS — Z9181 History of falling: Secondary | ICD-10-CM | POA: Diagnosis not present

## 2022-05-28 DIAGNOSIS — G4700 Insomnia, unspecified: Secondary | ICD-10-CM | POA: Diagnosis not present

## 2022-05-28 DIAGNOSIS — M6281 Muscle weakness (generalized): Secondary | ICD-10-CM | POA: Diagnosis not present

## 2022-05-28 DIAGNOSIS — F331 Major depressive disorder, recurrent, moderate: Secondary | ICD-10-CM | POA: Diagnosis not present

## 2022-05-30 DIAGNOSIS — Z9181 History of falling: Secondary | ICD-10-CM | POA: Diagnosis not present

## 2022-05-30 DIAGNOSIS — R2681 Unsteadiness on feet: Secondary | ICD-10-CM | POA: Diagnosis not present

## 2022-05-30 DIAGNOSIS — M6281 Muscle weakness (generalized): Secondary | ICD-10-CM | POA: Diagnosis not present

## 2022-05-31 DIAGNOSIS — M6281 Muscle weakness (generalized): Secondary | ICD-10-CM | POA: Diagnosis not present

## 2022-06-01 DIAGNOSIS — M6281 Muscle weakness (generalized): Secondary | ICD-10-CM | POA: Diagnosis not present

## 2022-06-02 DIAGNOSIS — M6281 Muscle weakness (generalized): Secondary | ICD-10-CM | POA: Diagnosis not present

## 2022-06-02 DIAGNOSIS — Z9181 History of falling: Secondary | ICD-10-CM | POA: Diagnosis not present

## 2022-06-02 DIAGNOSIS — R2681 Unsteadiness on feet: Secondary | ICD-10-CM | POA: Diagnosis not present

## 2022-06-03 DIAGNOSIS — R2681 Unsteadiness on feet: Secondary | ICD-10-CM | POA: Diagnosis not present

## 2022-06-03 DIAGNOSIS — Z9181 History of falling: Secondary | ICD-10-CM | POA: Diagnosis not present

## 2022-06-03 DIAGNOSIS — M6281 Muscle weakness (generalized): Secondary | ICD-10-CM | POA: Diagnosis not present

## 2022-06-04 DIAGNOSIS — M6281 Muscle weakness (generalized): Secondary | ICD-10-CM | POA: Diagnosis not present

## 2022-06-06 DIAGNOSIS — M6281 Muscle weakness (generalized): Secondary | ICD-10-CM | POA: Diagnosis not present

## 2022-06-06 DIAGNOSIS — R2681 Unsteadiness on feet: Secondary | ICD-10-CM | POA: Diagnosis not present

## 2022-06-06 DIAGNOSIS — Z9181 History of falling: Secondary | ICD-10-CM | POA: Diagnosis not present

## 2022-06-07 DIAGNOSIS — N3281 Overactive bladder: Secondary | ICD-10-CM | POA: Diagnosis not present

## 2022-06-07 DIAGNOSIS — R2681 Unsteadiness on feet: Secondary | ICD-10-CM | POA: Diagnosis not present

## 2022-06-07 DIAGNOSIS — M6281 Muscle weakness (generalized): Secondary | ICD-10-CM | POA: Diagnosis not present

## 2022-06-07 DIAGNOSIS — K582 Mixed irritable bowel syndrome: Secondary | ICD-10-CM | POA: Diagnosis not present

## 2022-06-07 DIAGNOSIS — Z9181 History of falling: Secondary | ICD-10-CM | POA: Diagnosis not present

## 2022-06-07 DIAGNOSIS — K219 Gastro-esophageal reflux disease without esophagitis: Secondary | ICD-10-CM | POA: Diagnosis not present

## 2022-06-07 DIAGNOSIS — G4700 Insomnia, unspecified: Secondary | ICD-10-CM | POA: Diagnosis not present

## 2022-06-08 DIAGNOSIS — M6281 Muscle weakness (generalized): Secondary | ICD-10-CM | POA: Diagnosis not present

## 2022-06-09 DIAGNOSIS — M6281 Muscle weakness (generalized): Secondary | ICD-10-CM | POA: Diagnosis not present

## 2022-06-10 DIAGNOSIS — Z9181 History of falling: Secondary | ICD-10-CM | POA: Diagnosis not present

## 2022-06-10 DIAGNOSIS — R2681 Unsteadiness on feet: Secondary | ICD-10-CM | POA: Diagnosis not present

## 2022-06-10 DIAGNOSIS — E782 Mixed hyperlipidemia: Secondary | ICD-10-CM | POA: Diagnosis not present

## 2022-06-10 DIAGNOSIS — I129 Hypertensive chronic kidney disease with stage 1 through stage 4 chronic kidney disease, or unspecified chronic kidney disease: Secondary | ICD-10-CM | POA: Diagnosis not present

## 2022-06-10 DIAGNOSIS — M6281 Muscle weakness (generalized): Secondary | ICD-10-CM | POA: Diagnosis not present

## 2022-06-11 DIAGNOSIS — F331 Major depressive disorder, recurrent, moderate: Secondary | ICD-10-CM | POA: Diagnosis not present

## 2022-06-11 DIAGNOSIS — G4700 Insomnia, unspecified: Secondary | ICD-10-CM | POA: Diagnosis not present

## 2022-06-15 DIAGNOSIS — R2681 Unsteadiness on feet: Secondary | ICD-10-CM | POA: Diagnosis not present

## 2022-06-15 DIAGNOSIS — Z9181 History of falling: Secondary | ICD-10-CM | POA: Diagnosis not present

## 2022-06-15 DIAGNOSIS — M6281 Muscle weakness (generalized): Secondary | ICD-10-CM | POA: Diagnosis not present

## 2022-06-16 ENCOUNTER — Ambulatory Visit: Payer: Medicare Other | Admitting: Hematology and Oncology

## 2022-06-16 ENCOUNTER — Other Ambulatory Visit: Payer: Medicare Other

## 2022-06-16 ENCOUNTER — Other Ambulatory Visit: Payer: Self-pay | Admitting: Hematology and Oncology

## 2022-06-16 DIAGNOSIS — D649 Anemia, unspecified: Secondary | ICD-10-CM

## 2022-06-16 DIAGNOSIS — R2681 Unsteadiness on feet: Secondary | ICD-10-CM | POA: Diagnosis not present

## 2022-06-16 DIAGNOSIS — Z9181 History of falling: Secondary | ICD-10-CM | POA: Diagnosis not present

## 2022-06-16 DIAGNOSIS — M6281 Muscle weakness (generalized): Secondary | ICD-10-CM | POA: Diagnosis not present

## 2022-06-17 DIAGNOSIS — M6281 Muscle weakness (generalized): Secondary | ICD-10-CM | POA: Diagnosis not present

## 2022-06-17 DIAGNOSIS — Z9181 History of falling: Secondary | ICD-10-CM | POA: Diagnosis not present

## 2022-06-17 DIAGNOSIS — R2681 Unsteadiness on feet: Secondary | ICD-10-CM | POA: Diagnosis not present

## 2022-06-18 DIAGNOSIS — M6281 Muscle weakness (generalized): Secondary | ICD-10-CM | POA: Diagnosis not present

## 2022-06-20 DIAGNOSIS — Z9181 History of falling: Secondary | ICD-10-CM | POA: Diagnosis not present

## 2022-06-20 DIAGNOSIS — M6281 Muscle weakness (generalized): Secondary | ICD-10-CM | POA: Diagnosis not present

## 2022-06-20 DIAGNOSIS — R2681 Unsteadiness on feet: Secondary | ICD-10-CM | POA: Diagnosis not present

## 2022-08-20 ENCOUNTER — Telehealth: Payer: Self-pay

## 2022-08-20 NOTE — Telephone Encounter (Signed)
Patient daughter called and states that patient ears are clogged up. She wants to schedule appointment for ears needed to be flush. She schedule appointment for next week and will call back if no longer needed.

## 2022-08-25 ENCOUNTER — Ambulatory Visit: Payer: Medicare Other | Admitting: Family

## 2022-12-23 ENCOUNTER — Encounter (HOSPITAL_BASED_OUTPATIENT_CLINIC_OR_DEPARTMENT_OTHER): Payer: Self-pay

## 2022-12-23 ENCOUNTER — Emergency Department (HOSPITAL_BASED_OUTPATIENT_CLINIC_OR_DEPARTMENT_OTHER): Payer: Medicare Other | Admitting: Radiology

## 2022-12-23 ENCOUNTER — Emergency Department (HOSPITAL_BASED_OUTPATIENT_CLINIC_OR_DEPARTMENT_OTHER): Payer: Medicare Other

## 2022-12-23 ENCOUNTER — Inpatient Hospital Stay (HOSPITAL_BASED_OUTPATIENT_CLINIC_OR_DEPARTMENT_OTHER)
Admission: EM | Admit: 2022-12-23 | Discharge: 2022-12-28 | DRG: 690 | Disposition: A | Discharge status: Skilled Nursing Facility | Payer: Medicare Other | Source: Skilled Nursing Facility | Attending: Internal Medicine | Admitting: Internal Medicine

## 2022-12-23 DIAGNOSIS — R531 Weakness: Secondary | ICD-10-CM | POA: Diagnosis not present

## 2022-12-23 DIAGNOSIS — S2241XA Multiple fractures of ribs, right side, initial encounter for closed fracture: Secondary | ICD-10-CM | POA: Diagnosis present

## 2022-12-23 DIAGNOSIS — H548 Legal blindness, as defined in USA: Secondary | ICD-10-CM | POA: Diagnosis present

## 2022-12-23 DIAGNOSIS — N3000 Acute cystitis without hematuria: Secondary | ICD-10-CM

## 2022-12-23 DIAGNOSIS — E785 Hyperlipidemia, unspecified: Secondary | ICD-10-CM | POA: Diagnosis present

## 2022-12-23 DIAGNOSIS — S32592A Other specified fracture of left pubis, initial encounter for closed fracture: Secondary | ICD-10-CM | POA: Diagnosis present

## 2022-12-23 DIAGNOSIS — S3219XA Other fracture of sacrum, initial encounter for closed fracture: Secondary | ICD-10-CM | POA: Diagnosis present

## 2022-12-23 DIAGNOSIS — F05 Delirium due to known physiological condition: Secondary | ICD-10-CM | POA: Diagnosis not present

## 2022-12-23 DIAGNOSIS — S72009D Fracture of unspecified part of neck of unspecified femur, subsequent encounter for closed fracture with routine healing: Secondary | ICD-10-CM | POA: Diagnosis not present

## 2022-12-23 DIAGNOSIS — Z7982 Long term (current) use of aspirin: Secondary | ICD-10-CM

## 2022-12-23 DIAGNOSIS — N1831 Chronic kidney disease, stage 3a: Secondary | ICD-10-CM | POA: Diagnosis present

## 2022-12-23 DIAGNOSIS — I10 Essential (primary) hypertension: Secondary | ICD-10-CM | POA: Diagnosis not present

## 2022-12-23 DIAGNOSIS — S3282XA Multiple fractures of pelvis without disruption of pelvic ring, initial encounter for closed fracture: Secondary | ICD-10-CM | POA: Diagnosis present

## 2022-12-23 DIAGNOSIS — Z8673 Personal history of transient ischemic attack (TIA), and cerebral infarction without residual deficits: Secondary | ICD-10-CM

## 2022-12-23 DIAGNOSIS — S3282XS Multiple fractures of pelvis without disruption of pelvic ring, sequela: Secondary | ICD-10-CM

## 2022-12-23 DIAGNOSIS — S32591A Other specified fracture of right pubis, initial encounter for closed fracture: Secondary | ICD-10-CM

## 2022-12-23 DIAGNOSIS — Y92128 Other place in nursing home as the place of occurrence of the external cause: Secondary | ICD-10-CM | POA: Diagnosis not present

## 2022-12-23 DIAGNOSIS — F3289 Other specified depressive episodes: Secondary | ICD-10-CM

## 2022-12-23 DIAGNOSIS — Z66 Do not resuscitate: Secondary | ICD-10-CM | POA: Diagnosis present

## 2022-12-23 DIAGNOSIS — W010XXA Fall on same level from slipping, tripping and stumbling without subsequent striking against object, initial encounter: Secondary | ICD-10-CM | POA: Diagnosis present

## 2022-12-23 DIAGNOSIS — Z96643 Presence of artificial hip joint, bilateral: Secondary | ICD-10-CM

## 2022-12-23 DIAGNOSIS — S2231XA Fracture of one rib, right side, initial encounter for closed fracture: Secondary | ICD-10-CM

## 2022-12-23 DIAGNOSIS — E782 Mixed hyperlipidemia: Secondary | ICD-10-CM

## 2022-12-23 DIAGNOSIS — D631 Anemia in chronic kidney disease: Secondary | ICD-10-CM | POA: Diagnosis present

## 2022-12-23 DIAGNOSIS — N179 Acute kidney failure, unspecified: Secondary | ICD-10-CM | POA: Diagnosis present

## 2022-12-23 DIAGNOSIS — I129 Hypertensive chronic kidney disease with stage 1 through stage 4 chronic kidney disease, or unspecified chronic kidney disease: Secondary | ICD-10-CM | POA: Diagnosis present

## 2022-12-23 DIAGNOSIS — S32502A Unspecified fracture of left pubis, initial encounter for closed fracture: Secondary | ICD-10-CM

## 2022-12-23 DIAGNOSIS — Z90710 Acquired absence of both cervix and uterus: Secondary | ICD-10-CM

## 2022-12-23 DIAGNOSIS — S72009A Fracture of unspecified part of neck of unspecified femur, initial encounter for closed fracture: Secondary | ICD-10-CM | POA: Diagnosis present

## 2022-12-23 DIAGNOSIS — N390 Urinary tract infection, site not specified: Secondary | ICD-10-CM | POA: Diagnosis not present

## 2022-12-23 DIAGNOSIS — F32A Depression, unspecified: Secondary | ICD-10-CM | POA: Diagnosis present

## 2022-12-23 DIAGNOSIS — D649 Anemia, unspecified: Secondary | ICD-10-CM | POA: Diagnosis not present

## 2022-12-23 DIAGNOSIS — Z79899 Other long term (current) drug therapy: Secondary | ICD-10-CM | POA: Diagnosis not present

## 2022-12-23 DIAGNOSIS — S3282XD Multiple fractures of pelvis without disruption of pelvic ring, subsequent encounter for fracture with routine healing: Secondary | ICD-10-CM | POA: Diagnosis not present

## 2022-12-23 DIAGNOSIS — S3210XA Unspecified fracture of sacrum, initial encounter for closed fracture: Secondary | ICD-10-CM

## 2022-12-23 DIAGNOSIS — S72001A Fracture of unspecified part of neck of right femur, initial encounter for closed fracture: Secondary | ICD-10-CM | POA: Diagnosis not present

## 2022-12-23 DIAGNOSIS — R410 Disorientation, unspecified: Secondary | ICD-10-CM | POA: Diagnosis not present

## 2022-12-23 LAB — COMPREHENSIVE METABOLIC PANEL
ALT: 10 U/L (ref 0–44)
AST: 23 U/L (ref 15–41)
Albumin: 3.8 g/dL (ref 3.5–5.0)
Alkaline Phosphatase: 198 U/L — ABNORMAL HIGH (ref 38–126)
Anion gap: 10 (ref 5–15)
BUN: 32 mg/dL — ABNORMAL HIGH (ref 8–23)
CO2: 18 mmol/L — ABNORMAL LOW (ref 22–32)
Calcium: 9.6 mg/dL (ref 8.9–10.3)
Chloride: 108 mmol/L (ref 98–111)
Creatinine, Ser: 1.29 mg/dL — ABNORMAL HIGH (ref 0.44–1.00)
GFR, Estimated: 38 mL/min — ABNORMAL LOW (ref >60)
Glucose, Bld: 109 mg/dL — ABNORMAL HIGH (ref 70–99)
Potassium: 4.1 mmol/L (ref 3.5–5.1)
Sodium: 136 mmol/L (ref 135–145)
Total Bilirubin: 0.6 mg/dL (ref 0.3–1.2)
Total Protein: 6.7 g/dL (ref 6.5–8.1)

## 2022-12-23 LAB — URINALYSIS, ROUTINE W REFLEX MICROSCOPIC
Bacteria, UA: NONE SEEN
Bilirubin Urine: NEGATIVE
Glucose, UA: NEGATIVE mg/dL
Hgb urine dipstick: NEGATIVE
Ketones, ur: 15 mg/dL — AB
Nitrite: NEGATIVE
Specific Gravity, Urine: 1.019 (ref 1.005–1.030)
pH: 6 (ref 5.0–8.0)

## 2022-12-23 LAB — CBC
HCT: 32.4 % — ABNORMAL LOW (ref 36.0–46.0)
Hemoglobin: 10.6 g/dL — ABNORMAL LOW (ref 12.0–15.0)
MCH: 31.3 pg (ref 26.0–34.0)
MCHC: 32.7 g/dL (ref 30.0–36.0)
MCV: 95.6 fL (ref 80.0–100.0)
Platelets: 234 10*3/uL (ref 150–400)
RBC: 3.39 MIL/uL — ABNORMAL LOW (ref 3.87–5.11)
RDW: 13.9 % (ref 11.5–15.5)
WBC: 13.3 10*3/uL — ABNORMAL HIGH (ref 4.0–10.5)
nRBC: 0 % (ref 0.0–0.2)

## 2022-12-23 MED ORDER — OXYCODONE-ACETAMINOPHEN 5-325 MG PO TABS: 1.0000 | ORAL_TABLET | Freq: Four times a day (QID) | ORAL | Status: DC | PRN

## 2022-12-23 MED ORDER — FOLIC ACID 1 MG PO TABS
1.0000 mg | ORAL_TABLET | Freq: Every day | ORAL | Status: DC
  Administered 2022-12-24 – 2022-12-28 (×5): 1 mg via ORAL
  Filled 2022-12-23 (×6): qty 1

## 2022-12-23 MED ORDER — POLYETHYLENE GLYCOL 3350 17 G PO PACK: 17.0000 g | PACK | ORAL | Status: DC

## 2022-12-23 MED ORDER — SODIUM CHLORIDE 0.9 % IV BOLUS
500.0000 mL | Freq: Once | INTRAVENOUS | Status: AC
  Administered 2022-12-23: 500 mL via INTRAVENOUS

## 2022-12-23 MED ORDER — PANTOPRAZOLE SODIUM 40 MG PO TBEC
40.0000 mg | DELAYED_RELEASE_TABLET | Freq: Every day | ORAL | Status: DC
  Administered 2022-12-24 – 2022-12-28 (×5): 40 mg via ORAL
  Filled 2022-12-23 (×5): qty 1

## 2022-12-23 MED ORDER — ADULT MULTIVITAMIN W/MINERALS CH
1.0000 | ORAL_TABLET | Freq: Every day | ORAL | Status: DC
  Administered 2022-12-24 – 2022-12-28 (×5): 1 via ORAL
  Filled 2022-12-23 (×5): qty 1

## 2022-12-23 MED ORDER — ASPIRIN 81 MG PO TBEC
81.0000 mg | DELAYED_RELEASE_TABLET | Freq: Every day | ORAL | Status: DC
  Administered 2022-12-24 – 2022-12-28 (×5): 81 mg via ORAL
  Filled 2022-12-23 (×5): qty 1

## 2022-12-23 MED ORDER — DOCUSATE SODIUM 100 MG PO CAPS
100.0000 mg | ORAL_CAPSULE | ORAL | Status: DC
  Administered 2022-12-24 – 2022-12-26 (×2): 100 mg via ORAL
  Filled 2022-12-23 (×2): qty 1

## 2022-12-23 MED ORDER — SODIUM CHLORIDE 0.9 % IV SOLN
1.0000 g | Freq: Once | INTRAVENOUS | Status: AC
  Administered 2022-12-23: 1 g via INTRAVENOUS
  Filled 2022-12-23: qty 10

## 2022-12-23 MED ORDER — ONDANSETRON HCL 4 MG PO TABS: 4.0000 mg | ORAL_TABLET | Freq: Four times a day (QID) | ORAL | Status: DC | PRN

## 2022-12-23 MED ORDER — ACETAMINOPHEN 650 MG RE SUPP: 650.0000 mg | Freq: Four times a day (QID) | RECTAL | Status: DC | PRN

## 2022-12-23 MED ORDER — RISAQUAD PO CAPS
1.0000 | ORAL_CAPSULE | Freq: Every day | ORAL | Status: DC
  Administered 2022-12-24 – 2022-12-28 (×5): 1 via ORAL
  Filled 2022-12-23 (×5): qty 1

## 2022-12-23 MED ORDER — OXYCODONE-ACETAMINOPHEN 5-325 MG PO TABS
1.0000 | ORAL_TABLET | Freq: Four times a day (QID) | ORAL | Status: DC | PRN
  Administered 2022-12-23 – 2022-12-24 (×2): 1 via ORAL
  Administered 2022-12-24: 2 via ORAL
  Administered 2022-12-24: 1 via ORAL
  Administered 2022-12-25: 2 via ORAL
  Administered 2022-12-25 – 2022-12-27 (×5): 1 via ORAL
  Filled 2022-12-23 (×2): qty 2
  Filled 2022-12-23 (×9): qty 1

## 2022-12-23 MED ORDER — HYDROCODONE-ACETAMINOPHEN 5-325 MG PO TABS: 1.0000 | ORAL_TABLET | ORAL | Status: DC | PRN

## 2022-12-23 MED ORDER — SODIUM CHLORIDE 0.9% FLUSH
3.0000 mL | INTRAVENOUS | Status: DC | PRN
  Administered 2022-12-25: 3 mL via INTRAVENOUS

## 2022-12-23 MED ORDER — FLUOXETINE HCL 20 MG PO CAPS
20.0000 mg | ORAL_CAPSULE | Freq: Every day | ORAL | Status: DC
  Administered 2022-12-24 – 2022-12-28 (×5): 20 mg via ORAL
  Filled 2022-12-23 (×5): qty 1

## 2022-12-23 MED ORDER — HYDROCODONE-ACETAMINOPHEN 5-325 MG PO TABS
1.0000 | ORAL_TABLET | Freq: Once | ORAL | Status: AC
  Administered 2022-12-23: 1 via ORAL
  Filled 2022-12-23: qty 1

## 2022-12-23 MED ORDER — SODIUM CHLORIDE 0.9% FLUSH
3.0000 mL | Freq: Two times a day (BID) | INTRAVENOUS | Status: DC
  Administered 2022-12-25 – 2022-12-28 (×6): 3 mL via INTRAVENOUS

## 2022-12-23 MED ORDER — CARMEX CLASSIC LIP BALM EX OINT
TOPICAL_OINTMENT | CUTANEOUS | Status: DC | PRN
  Filled 2022-12-23: qty 10

## 2022-12-23 MED ORDER — SODIUM CHLORIDE 0.9 % IV SOLN
1.0000 g | INTRAVENOUS | Status: AC
  Administered 2022-12-24 – 2022-12-27 (×4): 1 g via INTRAVENOUS
  Filled 2022-12-23 (×4): qty 10

## 2022-12-23 MED ORDER — SODIUM CHLORIDE 0.9% FLUSH
3.0000 mL | Freq: Two times a day (BID) | INTRAVENOUS | Status: DC
  Administered 2022-12-23: 3 mL via INTRAVENOUS

## 2022-12-23 MED ORDER — SODIUM CHLORIDE 0.9 % IV SOLN: 250.0000 mL | INTRAVENOUS | Status: DC | PRN

## 2022-12-23 MED ORDER — LACTATED RINGERS IV SOLN: INTRAVENOUS | Status: DC

## 2022-12-23 MED ORDER — MORPHINE SULFATE (PF) 2 MG/ML IV SOLN: 2.0000 mg | INTRAVENOUS | Status: DC | PRN

## 2022-12-23 MED ORDER — ACETAMINOPHEN 325 MG PO TABS: 650.0000 mg | ORAL_TABLET | Freq: Four times a day (QID) | ORAL | Status: DC | PRN

## 2022-12-23 MED ORDER — ENOXAPARIN SODIUM 30 MG/0.3ML IJ SOSY
30.0000 mg | PREFILLED_SYRINGE | INTRAMUSCULAR | Status: DC
  Administered 2022-12-24 – 2022-12-28 (×5): 30 mg via SUBCUTANEOUS
  Filled 2022-12-23 (×5): qty 0.3

## 2022-12-23 MED ORDER — VITAMIN D 25 MCG (1000 UNIT) PO TABS
1000.0000 [IU] | ORAL_TABLET | Freq: Every day | ORAL | Status: DC
  Administered 2022-12-24 – 2022-12-28 (×5): 1000 [IU] via ORAL
  Filled 2022-12-23 (×5): qty 1

## 2022-12-23 MED ORDER — ENOXAPARIN SODIUM 40 MG/0.4ML IJ SOSY
40.0000 mg | PREFILLED_SYRINGE | INTRAMUSCULAR | Status: DC
  Administered 2022-12-23: 40 mg via SUBCUTANEOUS
  Filled 2022-12-23: qty 0.4

## 2022-12-23 MED ORDER — ONDANSETRON HCL 4 MG/2ML IJ SOLN: 4.0000 mg | Freq: Four times a day (QID) | INTRAMUSCULAR | Status: DC | PRN

## 2022-12-23 MED ORDER — CEPHALEXIN 500 MG PO CAPS: 500.0000 mg | ORAL_CAPSULE | Freq: Two times a day (BID) | ORAL | Status: DC

## 2022-12-23 MED ORDER — SODIUM CHLORIDE 0.9 % IV SOLN: INTRAVENOUS | Status: DC | PRN

## 2022-12-23 NOTE — Progress Notes (Signed)
Plan of Care Note for accepted transfer   Patient: [name] MRN: [id number]   DOA: 12/23/2022  [facility] transfer[facility] Requesting Provider: [facility] Reason for transfer: Pelvic fractures Facility course: Patient with h/o HLD, HTN, stage 3a CKD, recurrent UTI and recent treatment for UTI treated with Bactrim -> Macrobid (culture with E coli) presenting for persistent urinary symptoms.  Increased confusion.  Had a fall last Thursday, unable to ambulate since with her usual walker.  CT with numerous pelvic fractures and subacute rib fractures. WBC 13.3.  No significant creatinine increase.  All fractures are non-operative.  I have requested that the EDP do a goals of care conversation - since these fractures are non-operative, it is also reasonable to offer this 89yo pain control and hospice with return to her ALF.  Family is concerned about her ability to through in ALF and agree with admission for PT/OT and probably SNF placement.    Plan of care: The patient is accepted for admission to Med-surg  unit, at [facility] or [facility].  Author: Jonah Blue, MD 12/23/2022  Check [email or web address] for on-call coverage.  Nursing staff, Please call [facility] Admits & Consults System-Wide number on Amion as soon as patient's arrival, so appropriate admitting provider can evaluate the pt.

## 2022-12-23 NOTE — ED Notes (Signed)
Evelyn [name] with cl called for transport  

## 2022-12-23 NOTE — Plan of Care (Signed)
  Problem: Health Behavior/Discharge Planning: Goal: Ability to manage health-related needs will improve Outcome: Progressing   

## 2022-12-23 NOTE — ED Notes (Signed)
PA at bedside, discussing fractures/prognosis, discussing trajectory of her care. Pt prefers hospice. Pure wick placed.

## 2022-12-23 NOTE — ED Provider Notes (Signed)
EMERGENCY DEPARTMENT AT [facility] Provider Note   CSN: [id number] Arrival date & time: 12/23/22  1435     History  Chief Complaint  Patient presents with   Urinary Tract Infection   Weakness    Evelyn [name] is a 89 y.o. female with medical history of arthritis, congenital blindness, depression, hepatitis, chronic cystitis with chronic colonization.  Patient presents to ED for evaluation of urinary tract infection as well as recent fall.  She is here with her daughter who provides history.  Per patient daughter, the patient had a ground-level fall last Thursday which she does not remember.  The patient reports that the last thing she remembers is watching the presidential debate and then wound up on the floor.  She believes that she may have cracked a rib on the right side.  Her daughter is concerned as she has had decreased ambulation, increased weakness ever since falling.  The patient daughter reports that the patient typically walks with a walker at baseline but has been unable to do so.  The patient daughter states that she was diagnosed with UTI 4 days ago and placed on Bactrim.  The patient daughter reports that the patient had a culture done which grew out E. coli and she was changed over to Macrobid last night.  The patient daughter is concerned because there has been no improvement in her symptoms.  The patient daughter reports the patient is currently confused, she asked the patient daughter "what party were you at last night" and the patient daughter denies ever being at a party.  The patient daughter denies any history of Alzheimer's disease.  Patient daughter reports that the patient typically has UTIs and sees urology.  The patient daughter denies that the patient takes blood thinning medication in reference to her recent fall.  The patient denies hitting her head, losing consciousness.  Patient daughter denies any recent fevers, nausea, vomiting, diarrhea or  abdominal pain for the patient.  The patient is currently alert and oriented x 2.   Urinary Tract Infection Associated symptoms: no abdominal pain, no fever, no nausea and no vomiting   Weakness Associated symptoms: arthralgias and dysuria   Associated symptoms: no abdominal pain, no fever, no nausea and no vomiting        Home Medications Prior to Admission medications   Medication Sig Start Date End Date Taking? Authorizing Provider  acetaminophen (TYLENOL) 500 MG tablet Take 500 mg by mouth in the morning and at bedtime.    [provider]  aspirin EC 81 MG tablet Take 81 mg by mouth daily. Swallow whole.    [provider]  cholecalciferol (VITAMIN D3) 25 MCG (1000 UNIT) tablet Take 1,000 Units by mouth daily.    [provider]  CRANBERRY-VITAMIN C-D MANNOSE PO Take 1 capsule by mouth daily.    [provider]  docusate sodium (COLACE) 100 MG capsule Take 100 mg by mouth every other day.    [provider]  FLUoxetine (PROZAC) 20 MG capsule Take 20 mg by mouth daily.    [provider]  folic acid (FOLVITE) 1 MG tablet Take 1 mg by mouth daily.    [provider]  HYDROcodone-acetaminophen (NORCO/VICODIN) 5-325 MG tablet Take 1 tablet by mouth every 6 (six) hours as needed for moderate pain. Patient taking differently: Take 0.5 tablets by mouth in the morning, at noon, and at bedtime. 12/18/21   Naida Sleight, PA-C  Lactobacillus (ULTIMATE PROBIOTIC FORMULA)  CAPS Take 1 capsule by mouth daily.    [provider]  lidocaine 4 % Place 1 patch onto the skin daily.    [provider]  Multiple Vitamin (DAILY VITE PO) Take 1 tablet by mouth daily.    [provider]  pantoprazole (PROTONIX) 40 MG tablet Take 1 tablet (40 mg total) by mouth daily before breakfast. 11/10/21   Gherghe, Daylene Katayama, MD  polyethylene glycol (MIRALAX / GLYCOLAX) 17 g packet Take 17 g by mouth 2 (two) times a week. Tuesday's  and Friday's    [provider]  zolpidem (AMBIEN) 5 MG tablet Take 5 mg by mouth at bedtime.    [provider]      Allergies    Patient has no known allergies.    Review of Systems   Review of Systems  Constitutional:  Negative for fever.  Gastrointestinal:  Negative for abdominal pain, nausea and vomiting.  Genitourinary:  Positive for dysuria.  Musculoskeletal:  Positive for arthralgias.  Neurological:  Positive for weakness.  All other systems reviewed and are negative.   Physical Exam Updated Vital Signs BP 121/81   Pulse 79   Temp 98.8 F (37.1 C)   Resp 16   SpO2 95%  Physical Exam Vitals and nursing note reviewed.  Constitutional:      General: She is not in acute distress.    Appearance: Normal appearance. She is not ill-appearing, toxic-appearing or diaphoretic.  HENT:     Head: Normocephalic and atraumatic.     Nose: Nose normal.     Mouth/Throat:     Mouth: Mucous membranes are moist.     Pharynx: Oropharynx is clear.  Eyes:     Extraocular Movements: Extraocular movements intact.     Conjunctiva/sclera: Conjunctivae normal.     Pupils: Pupils are equal, round, and reactive to light.  Cardiovascular:     Rate and Rhythm: Normal rate and regular rhythm.  Pulmonary:     Effort: Pulmonary effort is normal.     Breath sounds: Normal breath sounds. No wheezing.  Chest:       Comments: TTP of right posterior chest wall.  Abdominal:     General: Abdomen is flat. Bowel sounds are normal.     Palpations: Abdomen is soft.     Tenderness: There is no abdominal tenderness.  Musculoskeletal:     Cervical back: Normal range of motion and neck supple. No tenderness.     Right lower leg: No edema.     Left lower leg: No edema.     Comments: No tenderness to bilateral hips. No obvious shortening or rotation to bilateral lower extremities. Full ROM of bilateral wrists, elbows, shoulders. Full range of motion of bilateral ankles, knees.   Skin:     General: Skin is warm and dry.     Capillary Refill: Capillary refill takes less than 2 seconds.  Neurological:     General: No focal deficit present.     Mental Status: She is alert. She is disoriented.     GCS: GCS eye subscore is 4. GCS verbal subscore is 5. GCS motor subscore is 6.     Cranial Nerves: Cranial nerves 2-12 are intact. No cranial nerve deficit.     Sensory: Sensation is intact. No sensory deficit.     Motor: Motor function is intact. No weakness.     Coordination: Coordination is intact. Heel to [facility] Test normal.     Comments: Oriented to self, situation,  place. Not oriented to time.     ED Results / Procedures / Treatments   Labs (all labs ordered are listed, but only abnormal results are displayed) Labs Reviewed  CBC - Abnormal; Notable for the following components:      Result Value   WBC 13.3 (*)    RBC 3.39 (*)    Hemoglobin 10.6 (*)    HCT 32.4 (*)    All other components within normal limits  COMPREHENSIVE METABOLIC PANEL - Abnormal; Notable for the following components:   CO2 18 (*)    Glucose, Bld 109 (*)    BUN 32 (*)    Creatinine, Ser 1.29 (*)    Alkaline Phosphatase 198 (*)    GFR, Estimated 38 (*)    All other components within normal limits  URINALYSIS, ROUTINE W REFLEX MICROSCOPIC - Abnormal; Notable for the following components:   Ketones, ur 15 (*)    Protein, ur TRACE (*)    Leukocytes,Ua MODERATE (*)    All other components within normal limits    EKG None  Radiology DG Ribs Unilateral W/Chest Right  Result Date: 12/23/2022 CLINICAL DATA:  Status post fall Thursday.  Right rib pain. EXAM: RIGHT RIBS AND CHEST - 3+ VIEW COMPARISON:  None Available. FINDINGS: Bilateral chronic interstitial thickening. No focal consolidation. No pleural effusion or pneumothorax. Heart and mediastinal contours are unremarkable. Large hiatal hernia. Subacute-chronic multiple right posterior rib fractures involving the right posterior third, fourth,  fifth, sixth, seventh and eighth ribs. Old left posterior fifth through ninth ribs. IMPRESSION: 1. Subacute-chronic multiple right posterior rib fractures involving the right posterior third, fourth, fifth, sixth, seventh and eighth ribs. Electronically Signed   By: Elige Ko M.D.   On: 12/23/2022 16:41   DG Hip Unilat W or Wo Pelvis 2-3 Views Right  Result Date: 12/23/2022 CLINICAL DATA:  Fall.  Pain. EXAM: DG HIP (WITH OR WITHOUT PELVIS) 2-3V RIGHT COMPARISON:  12/31/2021 FINDINGS: Acute fractures of the right superior inferior pubic rami evident. Acute left pubic bone fracture noted. Advanced degenerative changes in the left hip without evidence for left femoral neck fracture. IMPRESSION: 1. Acute fractures of the right superior and inferior pubic rami. 2. Acute left pubic bone fracture. Electronically Signed   By: Kennith Center M.D.   On: 12/23/2022 16:41   CT PELVIS WO CONTRAST  Result Date: 12/23/2022 CLINICAL DATA:  Follow-up bilateral hip pain. EXAM: CT PELVIS WITHOUT CONTRAST TECHNIQUE: Multidetector CT imaging of the pelvis was performed following the standard protocol without intravenous contrast. RADIATION DOSE REDUCTION: This exam was performed according to the departmental dose-optimization program which includes automated exposure control, adjustment of the mA and/or kV according to patient size and/or use of iterative reconstruction technique. COMPARISON:  CT stone study 04/02/2022 FINDINGS: Urinary Tract:  Unremarkable. Bowel:  Choose 1 Vascular/Lymphatic: Atherosclerotic calcification distal aorta and common iliac arteries. No pelvic sidewall lymphadenopathy. Reproductive:  There is no adnexal mass. Other:  No intraperitoneal free fluid. Musculoskeletal: Status post left hip hemiarthroplasty. Probable insufficiency fracture of the right sacral ala. Nondisplaced acute appearing fracture identified in the left sacrum (see coronal 45/6 and axial 33/3). There is an acute nondisplaced fracture  involving the lateral aspect of the right superior pubic ramus near the acetabulum with acute fracture involving the mid aspect of the right inferior pubic ramus. Acute fracture noted left pubic bone (axial 101/3) with an associated fracture of the left inferior pubic ramus, age indeterminate. Advanced degenerative changes noted in the right hip  without evidence for right femoral neck fracture. The entire femoral component of the left hip replacement has not been included on the study. IMPRESSION: 1. Acute nondisplaced fracture involving the lateral aspect of the right superior pubic ramus near the acetabulum with acute fracture involving the mid aspect of the right inferior pubic ramus. 2. Acute fracture of the left pubic bone with an associated fracture of the left inferior pubic ramus, age indeterminate. 3. Nondisplaced acute appearing fracture of the left sacrum. 4. Fracture of the right sacral ala is likely a chronic insufficiency fracture. 5. Advanced degenerative changes in the right hip without evidence for right femoral neck fracture. 6.  Aortic Atherosclerosis (ICD10-I70.0). Electronically Signed   By: Kennith Center M.D.   On: 12/23/2022 16:39   DG Hip Unilat W or Wo Pelvis 2-3 Views Left  Result Date: 12/23/2022 CLINICAL DATA:  Status post fall.  Hip pain. EXAM: DG HIP (WITH OR WITHOUT PELVIS) 2-3V LEFT COMPARISON:  01/29/2022 FINDINGS: Bones are diffusely demineralized. SI joints unremarkable. There is an acute fracture of the right superior pubic ramus near the acetabulum with cortical disruption in the inferior right pubic ramus. These findings are new since prior. Possible acute fracture involving the left pubic bone. Degenerative changes noted right hip. Patient is status post left hip hemiarthroplasty without complicating features. IMPRESSION: 1. Acute fractures of the right superior and inferior pubic rami. 2. Possible acute fracture involving the left pubic bone. Cortical irregularity in the  inferior left pubic pubic ramus also raises the question of fracture. CT imaging could be used to further evaluate as clinically warranted. 3. Status post left hip hemiarthroplasty without complicating features. Electronically Signed   By: Kennith Center M.D.   On: 12/23/2022 16:32   CT Head Wo Contrast  Result Date: 12/23/2022 CLINICAL DATA:  Head and neck trauma EXAM: CT HEAD WITHOUT CONTRAST CT CERVICAL SPINE WITHOUT CONTRAST TECHNIQUE: Multidetector CT imaging of the head and cervical spine was performed following the standard protocol without intravenous contrast. Multiplanar CT image reconstructions of the cervical spine were also generated. RADIATION DOSE REDUCTION: This exam was performed according to the departmental dose-optimization program which includes automated exposure control, adjustment of the mA and/or kV according to patient size and/or use of iterative reconstruction technique. COMPARISON:  04/02/2022 FINDINGS: CT HEAD FINDINGS Brain: No evidence of acute infarction, hemorrhage, hydrocephalus, extra-axial collection or mass lesion/mass effect. Periventricular white matter hypodensity. Lacunar infarct of the right caudate, unchanged. Vascular: No hyperdense vessel or unexpected calcification. Skull: Normal. Negative for fracture or focal lesion. Sinuses/Orbits: No acute finding. Other: None. CT CERVICAL SPINE FINDINGS Alignment: Normal. Skull base and vertebrae: No acute fracture. No primary bone lesion or focal pathologic process. Soft tissues and spinal canal: No prevertebral fluid or swelling. No visible canal hematoma. Disc levels: Mild multilevel disc space height loss and osteophytosis. Upper chest: Negative. Other: None. IMPRESSION: 1. No acute intracranial pathology. 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 2. No fracture or static subluxation of the cervical spine. 3. Mild multilevel cervical disc degenerative disease. Electronically  Signed   By: Jearld Lesch M.D.   On: 12/23/2022 16:25   CT Cervical Spine Wo Contrast  Result Date: 12/23/2022 CLINICAL DATA:  Head and neck trauma EXAM: CT HEAD WITHOUT CONTRAST CT CERVICAL SPINE WITHOUT CONTRAST TECHNIQUE: Multidetector CT imaging of the head and cervical spine was performed following the standard protocol without intravenous contrast. Multiplanar CT image reconstructions of the cervical spine were also  generated. RADIATION DOSE REDUCTION: This exam was performed according to the departmental dose-optimization program which includes automated exposure control, adjustment of the mA and/or kV according to patient size and/or use of iterative reconstruction technique. COMPARISON:  04/02/2022 FINDINGS: CT HEAD FINDINGS Brain: No evidence of acute infarction, hemorrhage, hydrocephalus, extra-axial collection or mass lesion/mass effect. Periventricular white matter hypodensity. Lacunar infarct of the right caudate, unchanged. Vascular: No hyperdense vessel or unexpected calcification. Skull: Normal. Negative for fracture or focal lesion. Sinuses/Orbits: No acute finding. Other: None. CT CERVICAL SPINE FINDINGS Alignment: Normal. Skull base and vertebrae: No acute fracture. No primary bone lesion or focal pathologic process. Soft tissues and spinal canal: No prevertebral fluid or swelling. No visible canal hematoma. Disc levels: Mild multilevel disc space height loss and osteophytosis. Upper chest: Negative. Other: None. IMPRESSION: 1. No acute intracranial pathology. 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 2. No fracture or static subluxation of the cervical spine. 3. Mild multilevel cervical disc degenerative disease. Electronically Signed   By: Jearld Lesch M.D.   On: 12/23/2022 16:25    Procedures Procedures   Medications Ordered in ED Medications  0.9 %  sodium chloride infusion ( Intravenous New Bag/Given 12/23/22 1633)  sodium chloride  0.9 % bolus 500 mL (500 mLs Intravenous New Bag/Given 12/23/22 1706)  cefTRIAXone (ROCEPHIN) 1 g in sodium chloride 0.9 % 100 mL IVPB (1 g Intravenous New Bag/Given 12/23/22 1634)  HYDROcodone-acetaminophen (NORCO/VICODIN) 5-325 MG per tablet 1 tablet (1 tablet Oral Given 12/23/22 1843)    ED Course/ Medical Decision Making/ A&P Clinical Course as of 12/23/22 1932  Thu Dec 23, 2022  1643 Acute nondisplaced fracture involving the lateral aspect of the right superior pubic ramus near the acetabulum with acute fracture involving the mid aspect of the right inferior pubic ramus. 2. Acute fracture of the left pubic bone with an associated fracture of the left inferior pubic ramus, age indeterminate. 3. Nondisplaced acute appearing fracture of the left sacrum.   [CG]    Clinical Course User Index [CG] Al Decant, PA-C    Medical Decision Making Amount and/or Complexity of Data Reviewed Labs: ordered. Radiology: ordered.   89 year old female presents to the ED for evaluation.  Please see HPI for further details. On examination patient is afebrile and nontachycardic.  Her lung sounds are clear bilaterally and she is not hypoxic.  Her abdomen is soft and compressible with no tenderness.  No CVA tenderness bilaterally.  Patient has full range of motion of bilateral upper extremities.  She has full range of motion of bilateral ankles, knees.  She has minimal tenderness to left hip.  She is alert and oriented x 2, not oriented to year.  Patient daughter reports that this is new for her she believes secondary to UTI.   CBC with leukocytosis 13.3, hemoglobin 10.6.  Patient metabolic panel shows creatinine 1.29 which is elevated from prior.  Patient CO2 18, glucose 109, BUN 32, alk phos 198.  Patient anion gap of 10.  Urinalysis shows leukocytes, trace protein.  Patient recently switched medications to [facility] and had recent culture.  Patient CT of head and cervical spine  unremarkable.  Patient planum imaging of right chest shows subacute to chronic multiple right posterior rib fractures involving the right posterior third, fourth, fifth, sixth, seventh and eighth ribs.  Patient CT pelvis without contrast shows an acute nondisplaced fracture involving the lateral aspect of the right superior pubic rami near the acetabulum with  acute fracture involving the mid aspect of the right inferior pubic ramus.  There is also an acute fracture of the left pubic bone with an associated fracture of the left inferior pubic ramus, age-indeterminate.  Also a nondisplaced acute appearing fracture of the left sacrum.  Also a fracture of the right sacral [place] is likely a chronic insufficiency fracture.  These findings were discussed with on-call orthopedic doctor, Dr. Steward Drone, who advises these are stable fractures which would not require surgical management.  He is happy to consult on the patient if she is admitted.  Patient does have history of left hip hemiarthroplasty with Dr. Ophelia Charter in 2023.  Reached out to hospitalist, Dr. Ophelia Charter, for admission.  Dr. Ophelia Charter is concerned about patient coming into hospital when she most likely could be sent home with pain control and possible hospice referral.  Discussed this with healthcare power of attorney, Roseanne Reno, the patient's son.  Roseanne Reno wants the patient to be admitted and to be "as aggressive as possible, money is no issue, I do not even want to hear the word hospice".  I got my attending, Dr. Deretha Emory, involved in the situation.  Dr. Deretha Emory engaged with the patient family and after lengthy discussion, they have decided that the patient will be admitted for PT and OT evaluation and placement into a skilled nursing facility.  The patient daughter here is agreeable to this.  The patient is agreeable to this as well.  Patient admitted at this time to Dr. Ophelia Charter.  Final Clinical Impression(s) / ED Diagnoses Final diagnoses:  Closed fracture of  ramus of right pubis, initial encounter (HCC)  Closed fracture of left pubis, unspecified portion of pubis, initial encounter (HCC)  Closed fracture of sacrum, unspecified portion of sacrum, initial encounter [facility])  Urinary tract infection without hematuria, site unspecified    Rx / DC Orders ED Discharge Orders     None         Al Decant, PA-C 12/23/22 1932    Rozelle Logan, DO 12/24/22 [phone]

## 2022-12-23 NOTE — H&P (Signed)
History and Physical    Evelyn [name] ZOX:[id number] DOB: 1925/01/26 DOA: 12/23/2022  DOS: the patient was seen and examined on 12/23/2022  PCP: Frederica Kuster, MD   Patient coming from: ALF   Chief Complaint:  Chief Complaint  Patient presents with   Urinary Tract Infection   Weakness    HPI:  This is a 89 year old female medical history of recurrent UTI, hypertension, hyperlipidemia, CKD stage 3A, left hip replacement 12/17/2021, cognitive impairment, right-sided eye blindness, hiatal hernia, chronic abdominal and back pain who initially presented to emergency department at [facility] for evaluation of urinary tract infection as well as recent fall.  She was initially presented her daughter who provides history.  Per patient daughter, the patient had a ground-level fall last Thursday which she does not remember.  The patient reports that the last thing she remembers is watching the presidential debate and then wound up on the floor.  She believes that she may have cracked a rib on the right side.  Her daughter is concerned as she has had decreased ambulation, increased weakness ever since falling.  The patient daughter reports that the patient typically walks with a walker at baseline but has been unable to do so.  The patient daughter states that she was diagnosed with UTI 4 days ago and placed on Bactrim.  The patient daughter reports that the patient had a culture done which grew out E. coli and she was changed over to Macrobid last night.  The patient daughter is concerned because there has been no improvement in her symptoms.  The patient daughter reports the patient is currently confused, she asked the patient daughter "what party were you at last night" and the patient daughter denies ever being at a party.  The patient daughter denies any history of Alzheimer's disease.  Patient daughter reports that the patient typically has UTIs and sees urology.  The patient daughter denies that the  patient takes blood thinning medication in reference to her recent fall.  The patient denies hitting her head, losing consciousness.  Patient daughter denies any recent fevers, nausea, vomiting, diarrhea or abdominal pain for the patient.  Patient reporting some burning sensation during urination.  ED Course:  At ED on initial presentation patient's heart rate 86, respiratory 16, blood pressure 133/56 and O2 sat 98% room air, elevated ALP 198, GFR 38 and elevated creatinine 1.29.  CBC showed slight leukocytosis 13.3, stable H&H 10.6 and 32.4. CMP showed l sodium 136, potassium 4.1. UA clear appearance, normal gravity, trace ketone, trace protein, moderate leukocyte and no bacteria.  X-ray chest showed subacute chronic multiple right posterior rib fracture involving right posterior 3rd to 8th ribs. X-ray left hip showed acute fracture of right posterior inferior pelvic rami.  Possible acute fracture involving left pubic bone.  Status post left hip hemiarthroplasty without complication paycheck. X-ray right hip showed acute fracture of right superior and inferior pubic rami and acute left pubic bone fracture.  CT pelvis wo contrast showed-cute nondisplaced fracture involving the lateral aspect of the right pubic ramus and acute fracture of the left pubic bone with associated fracture of the left inferior pubic ramus.  Due to history of fall CT head obtained which ruled out any acute intracranial pathology.  Small vessel white matter disease and old lacunar infarction on the right caudate unchanged.  CT cervical spine no acute intracranial pathology, no fracture or subluxation of cervical spine and mild multilevel cervical disc degenerative disease.  ED physician has reached  out to on call  orthopedics Dr. who advises these are stable fractures which would not require surgical management.  He is happy to consult on the patient if she is admitted.  Patient does have history of left hip  hemiarthroplasty with Dr. Ophelia Charter in 2023.   Per chart review from from ED provider note from [facility]-"Discussed this with healthcare power of attorney, Roseanne Reno, the patient's son. Roseanne Reno wants the patient to be admitted and to be "as aggressive as possible, money is no issue, I do not even want to hear the word hospice". I got my attending, Dr. Deretha Emory, involved in the situation. Dr. Deretha Emory engaged with the patient family and after lengthy discussion, they have decided that the patient will be admitted for PT and OT evaluation and placement into a skilled nursing facility. The patient daughter here is agreeable to this. The patient is agreeable to this as well."  Review of Systems:  Review of Systems  Constitutional:  Negative for chills and fever.  Respiratory:  Negative for cough.   Cardiovascular:  Negative for chest pain and leg swelling.  Gastrointestinal:  Negative for heartburn, nausea and vomiting.  Musculoskeletal:  Positive for back pain.       Complaining about pain around the pelvic area  Neurological:  Negative for dizziness, tremors, weakness and headaches.  Psychiatric/Behavioral:  The patient is not nervous/anxious.     Past Medical History:  Diagnosis Date   Allergy    Arthritis    Chronic cystitis    Congenital blindness    right eye   Depression    Gastric ulcer    H/O: hematuria    Hepatitis, unspecified    statin related, resolved quickly   Hyperlipidemia    Legally blind in right eye, as defined in [place]    Syncope and collapse 04/01/2014   UTI (lower urinary tract infection)    HISTORY OF UTI    Past Surgical History:  Procedure Laterality Date   ABDOMINAL HYSTERECTOMY     vaginal   BLADDER SUSPENSION     A-P   CATARACT EXTRACTION W/ INTRAOCULAR LENS IMPLANT Left    ESOPHAGOGASTRODUODENOSCOPY     HIP ARTHROPLASTY Left 12/17/2021   Procedure: ARTHROPLASTY BIPOLAR HIP (HEMIARTHROPLASTY);  Surgeon: Eldred Manges, MD;  Location: [facility];  Service:  Orthopedics;  Laterality: Left;   OPEN REDUCTION INTERNAL FIXATION (ORIF) DISTAL RADIAL FRACTURE Right 03/09/2016   Procedure: OPEN REDUCTION INTERNAL FIXATION (ORIF) DISTAL RADIAL FRACTURE;  Surgeon: Betha Loa, MD;  Location: [facility];  Service: Orthopedics;  Laterality: Right;     reports that she has never smoked. She has never used smokeless tobacco. She reports that she does not drink alcohol and does not use drugs.  No Known Allergies  Family History  Problem Relation Age of Onset   Cancer Neg Hx        Colon cancer   Stroke Neg Hx    Heart disease Neg Hx    Hyperlipidemia Neg Hx    Hypertension Neg Hx    Kidney disease Neg Hx    Colon polyps Neg Hx    Colon cancer Neg Hx     Prior to Admission medications   Medication Sig Start Date End Date Taking? Authorizing Provider  acetaminophen (TYLENOL) 500 MG tablet Take 500 mg by mouth in the morning and at bedtime.    [provider]  aspirin EC 81 MG tablet Take 81 mg by mouth daily. Swallow whole.  [provider]  cholecalciferol (VITAMIN D3) 25 MCG (1000 UNIT) tablet Take 1,000 Units by mouth daily.    [provider]  CRANBERRY-VITAMIN C-D MANNOSE PO Take 1 capsule by mouth daily.    [provider]  docusate sodium (COLACE) 100 MG capsule Take 100 mg by mouth every other day.    [provider]  FLUoxetine (PROZAC) 20 MG capsule Take 20 mg by mouth daily.    [provider]  folic acid (FOLVITE) 1 MG tablet Take 1 mg by mouth daily.    [provider]  HYDROcodone-acetaminophen (NORCO/VICODIN) 5-325 MG tablet Take 1 tablet by mouth every 6 (six) hours as needed for moderate pain. Patient taking differently: Take 0.5 tablets by mouth in the morning, at noon, and at bedtime. 12/18/21   Naida Sleight, PA-C  Lactobacillus (ULTIMATE PROBIOTIC FORMULA) CAPS Take 1 capsule by mouth daily.    [provider]  lidocaine 4 % Place 1 patch onto  the skin daily.    [provider]  Multiple Vitamin (DAILY VITE PO) Take 1 tablet by mouth daily.    [provider]  pantoprazole (PROTONIX) 40 MG tablet Take 1 tablet (40 mg total) by mouth daily before breakfast. 11/10/21   Gherghe, Daylene Katayama, MD  polyethylene glycol (MIRALAX / GLYCOLAX) 17 g packet Take 17 g by mouth 2 (two) times a week. Tuesday's and Friday's    [provider]  zolpidem (AMBIEN) 5 MG tablet Take 5 mg by mouth at bedtime.    [provider]     Physical Exam: Vitals:   12/23/22 1445 12/23/22 1500 12/23/22 2010 12/23/22 2051  BP: (!) 121/57 121/81 (!) 164/86 129/62  Pulse: 81 79 91 87  Resp:   20 19  Temp:   98.6 F (37 C) (!) 97.4 F (36.3 C)  TempSrc:   Oral Oral  SpO2: 95% 95% 92% 92%    Physical Exam Constitutional:      Appearance: Normal appearance.  HENT:     Head: Normocephalic and atraumatic.     Mouth/Throat:     Mouth: Mucous membranes are dry.  Eyes:     Pupils: Pupils are equal, round, and reactive to light.  Cardiovascular:     Rate and Rhythm: Normal rate and regular rhythm.     Pulses: Normal pulses.     Heart sounds: Normal heart sounds.  Pulmonary:     Effort: Pulmonary effort is normal.     Breath sounds: Normal breath sounds.  Abdominal:     General: Bowel sounds are normal.  Musculoskeletal:     Cervical back: Neck supple.     Right lower leg: No edema.     Left lower leg: No edema.     Comments: Pain 6-7 out of 10 intensity around bilateral hip joint area.    Skin:    General: Skin is warm.     Capillary Refill: Capillary refill takes less than 2 seconds.  Neurological:     Mental Status: She is alert and oriented to person, place, and time.     Cranial Nerves: No cranial nerve deficit.     Motor: No weakness.  Psychiatric:        Mood and Affect: Mood normal.      Labs on Admission: I have personally reviewed following labs and imaging studies  CBC: Recent Labs  Lab  12/23/22 1459  WBC 13.3*  HGB 10.6*  HCT 32.4*  MCV 95.6  PLT  234   Basic Metabolic Panel: Recent Labs  Lab 12/23/22 1459  NA 136  K 4.1  CL 108  CO2 18*  GLUCOSE 109*  BUN 32*  CREATININE 1.29*  CALCIUM 9.6   GFR: CrCl cannot be calculated (Unknown ideal weight.). Liver Function Tests: Recent Labs  Lab 12/23/22 1459  AST 23  ALT 10  ALKPHOS 198*  BILITOT 0.6  PROT 6.7  ALBUMIN 3.8   No results for input(s): "LIPASE", "AMYLASE" in the last 168 hours. No results for input(s): "AMMONIA" in the last 168 hours. Coagulation Profile: No results for input(s): "INR", "PROTIME" in the last 168 hours. Cardiac Enzymes: No results for input(s): "CKTOTAL", "CKMB", "CKMBINDEX", "TROPONINI", "TROPONINIHS" in the last 168 hours. BNP (last 3 results) No results for input(s): "BNP" in the last 8760 hours. HbA1C: No results for input(s): "HGBA1C" in the last 72 hours. CBG: No results for input(s): "GLUCAP" in the last 168 hours. Lipid Profile: No results for input(s): "CHOL", "HDL", "LDLCALC", "TRIG", "CHOLHDL", "LDLDIRECT" in the last 72 hours. Thyroid Function Tests: No results for input(s): "TSH", "T4TOTAL", "FREET4", "T3FREE", "THYROIDAB" in the last 72 hours. Anemia Panel: No results for input(s): "VITAMINB12", "FOLATE", "FERRITIN", "TIBC", "IRON", "RETICCTPCT" in the last 72 hours. Urine analysis:    Component Value Date/Time   COLORURINE YELLOW 12/23/2022 1800   APPEARANCEUR CLEAR 12/23/2022 1800   LABSPEC 1.019 12/23/2022 1800   PHURINE 6.0 12/23/2022 1800   GLUCOSEU NEGATIVE 12/23/2022 1800   GLUCOSEU NEGATIVE 02/01/2017 1212   HGBUR NEGATIVE 12/23/2022 1800   BILIRUBINUR NEGATIVE 12/23/2022 1800   KETONESUR 15 (A) 12/23/2022 1800   PROTEINUR TRACE (A) 12/23/2022 1800   UROBILINOGEN 1.0 02/01/2017 1212   NITRITE NEGATIVE 12/23/2022 1800   LEUKOCYTESUR MODERATE (A) 12/23/2022 1800    Radiological Exams on Admission: I have personally reviewed images DG Ribs  Unilateral W/Chest Right  Result Date: 12/23/2022 CLINICAL DATA:  Status post fall Thursday.  Right rib pain. EXAM: RIGHT RIBS AND CHEST - 3+ VIEW COMPARISON:  None Available. FINDINGS: Bilateral chronic interstitial thickening. No focal consolidation. No pleural effusion or pneumothorax. Heart and mediastinal contours are unremarkable. Large hiatal hernia. Subacute-chronic multiple right posterior rib fractures involving the right posterior third, fourth, fifth, sixth, seventh and eighth ribs. Old left posterior fifth through ninth ribs. IMPRESSION: 1. Subacute-chronic multiple right posterior rib fractures involving the right posterior third, fourth, fifth, sixth, seventh and eighth ribs. Electronically Signed   By: Elige Ko M.D.   On: 12/23/2022 16:41   DG Hip Unilat W or Wo Pelvis 2-3 Views Right  Result Date: 12/23/2022 CLINICAL DATA:  Fall.  Pain. EXAM: DG HIP (WITH OR WITHOUT PELVIS) 2-3V RIGHT COMPARISON:  12/31/2021 FINDINGS: Acute fractures of the right superior inferior pubic rami evident. Acute left pubic bone fracture noted. Advanced degenerative changes in the left hip without evidence for left femoral neck fracture. IMPRESSION: 1. Acute fractures of the right superior and inferior pubic rami. 2. Acute left pubic bone fracture. Electronically Signed   By: Kennith Center M.D.   On: 12/23/2022 16:41   CT PELVIS WO CONTRAST  Result Date: 12/23/2022 CLINICAL DATA:  Follow-up bilateral hip pain. EXAM: CT PELVIS WITHOUT CONTRAST TECHNIQUE: Multidetector CT imaging of the pelvis was performed following the standard protocol without intravenous contrast. RADIATION DOSE REDUCTION: This exam was performed according to the departmental dose-optimization program which includes automated exposure control, adjustment of the mA and/or kV according to patient size and/or use of iterative reconstruction technique. COMPARISON:  CT stone  study 04/02/2022 FINDINGS: Urinary Tract:  Unremarkable. Bowel:  Choose 1  Vascular/Lymphatic: Atherosclerotic calcification distal aorta and common iliac arteries. No pelvic sidewall lymphadenopathy. Reproductive:  There is no adnexal mass. Other:  No intraperitoneal free fluid. Musculoskeletal: Status post left hip hemiarthroplasty. Probable insufficiency fracture of the right sacral ala. Nondisplaced acute appearing fracture identified in the left sacrum (see coronal 45/6 and axial 33/3). There is an acute nondisplaced fracture involving the lateral aspect of the right superior pubic ramus near the acetabulum with acute fracture involving the mid aspect of the right inferior pubic ramus. Acute fracture noted left pubic bone (axial 101/3) with an associated fracture of the left inferior pubic ramus, age indeterminate. Advanced degenerative changes noted in the right hip without evidence for right femoral neck fracture. The entire femoral component of the left hip replacement has not been included on the study. IMPRESSION: 1. Acute nondisplaced fracture involving the lateral aspect of the right superior pubic ramus near the acetabulum with acute fracture involving the mid aspect of the right inferior pubic ramus. 2. Acute fracture of the left pubic bone with an associated fracture of the left inferior pubic ramus, age indeterminate. 3. Nondisplaced acute appearing fracture of the left sacrum. 4. Fracture of the right sacral ala is likely a chronic insufficiency fracture. 5. Advanced degenerative changes in the right hip without evidence for right femoral neck fracture. 6.  Aortic Atherosclerosis (ICD10-I70.0). Electronically Signed   By: Kennith Center M.D.   On: 12/23/2022 16:39   DG Hip Unilat W or Wo Pelvis 2-3 Views Left  Result Date: 12/23/2022 CLINICAL DATA:  Status post fall.  Hip pain. EXAM: DG HIP (WITH OR WITHOUT PELVIS) 2-3V LEFT COMPARISON:  01/29/2022 FINDINGS: Bones are diffusely demineralized. SI joints unremarkable. There is an acute fracture of the right superior pubic  ramus near the acetabulum with cortical disruption in the inferior right pubic ramus. These findings are new since prior. Possible acute fracture involving the left pubic bone. Degenerative changes noted right hip. Patient is status post left hip hemiarthroplasty without complicating features. IMPRESSION: 1. Acute fractures of the right superior and inferior pubic rami. 2. Possible acute fracture involving the left pubic bone. Cortical irregularity in the inferior left pubic pubic ramus also raises the question of fracture. CT imaging could be used to further evaluate as clinically warranted. 3. Status post left hip hemiarthroplasty without complicating features. Electronically Signed   By: Kennith Center M.D.   On: 12/23/2022 16:32   CT Head Wo Contrast  Result Date: 12/23/2022 CLINICAL DATA:  Head and neck trauma EXAM: CT HEAD WITHOUT CONTRAST CT CERVICAL SPINE WITHOUT CONTRAST TECHNIQUE: Multidetector CT imaging of the head and cervical spine was performed following the standard protocol without intravenous contrast. Multiplanar CT image reconstructions of the cervical spine were also generated. RADIATION DOSE REDUCTION: This exam was performed according to the departmental dose-optimization program which includes automated exposure control, adjustment of the mA and/or kV according to patient size and/or use of iterative reconstruction technique. COMPARISON:  04/02/2022 FINDINGS: CT HEAD FINDINGS Brain: No evidence of acute infarction, hemorrhage, hydrocephalus, extra-axial collection or mass lesion/mass effect. Periventricular white matter hypodensity. Lacunar infarct of the right caudate, unchanged. Vascular: No hyperdense vessel or unexpected calcification. Skull: Normal. Negative for fracture or focal lesion. Sinuses/Orbits: No acute finding. Other: None. CT CERVICAL SPINE FINDINGS Alignment: Normal. Skull base and vertebrae: No acute fracture. No primary bone lesion or focal pathologic process. Soft  tissues and spinal canal: No prevertebral fluid or  swelling. No visible canal hematoma. Disc levels: Mild multilevel disc space height loss and osteophytosis. Upper chest: Negative. Other: None. IMPRESSION: 1. No acute intracranial pathology. 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 2. No fracture or static subluxation of the cervical spine. 3. Mild multilevel cervical disc degenerative disease. Electronically Signed   By: Jearld Lesch M.D.   On: 12/23/2022 16:25   CT Cervical Spine Wo Contrast  Result Date: 12/23/2022 CLINICAL DATA:  Head and neck trauma EXAM: CT HEAD WITHOUT CONTRAST CT CERVICAL SPINE WITHOUT CONTRAST TECHNIQUE: Multidetector CT imaging of the head and cervical spine was performed following the standard protocol without intravenous contrast. Multiplanar CT image reconstructions of the cervical spine were also generated. RADIATION DOSE REDUCTION: This exam was performed according to the departmental dose-optimization program which includes automated exposure control, adjustment of the mA and/or kV according to patient size and/or use of iterative reconstruction technique. COMPARISON:  04/02/2022 FINDINGS: CT HEAD FINDINGS Brain: No evidence of acute infarction, hemorrhage, hydrocephalus, extra-axial collection or mass lesion/mass effect. Periventricular white matter hypodensity. Lacunar infarct of the right caudate, unchanged. Vascular: No hyperdense vessel or unexpected calcification. Skull: Normal. Negative for fracture or focal lesion. Sinuses/Orbits: No acute finding. Other: None. CT CERVICAL SPINE FINDINGS Alignment: Normal. Skull base and vertebrae: No acute fracture. No primary bone lesion or focal pathologic process. Soft tissues and spinal canal: No prevertebral fluid or swelling. No visible canal hematoma. Disc levels: Mild multilevel disc space height loss and osteophytosis. Upper chest: Negative. Other: None. IMPRESSION: 1. No acute  intracranial pathology. 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 2. No fracture or static subluxation of the cervical spine. 3. Mild multilevel cervical disc degenerative disease. Electronically Signed   By: Jearld Lesch M.D.   On: 12/23/2022 16:25      Assessment/Plan: Active Problems:   Bilateral acute nondisplaced hip fracture (HCC)   AKI (acute kidney injury) (HCC)   UTI (urinary tract infection)   HLD (hyperlipidemia)   Depression   CKD stage 3a, GFR 45-59 ml/min (HCC)   Essential hypertension   History of bilateral hip arthroplasty    Assessment and Plan: Bilateral acute nondisplaced rib fracture Nondisplaced posterior rib fracture  Mechanical fall - Patient was watching TV on the couch,, she went to stand up she lost her balance and had an fall on the ground. -X-ray chest showed subacute chronic multiple right posterior rib fracture involving right posterior 3rd to 8th ribs. -X-ray left hip showed acute fracture of right posterior inferior pelvic rami.  Possible acute fracture involving left pubic bone.  Status post left hip hemiarthroplasty without complication paycheck. -X-ray right hip showed acute fracture of right superior and inferior pubic rami and acute left pubic bone fracture. -CT pelvis wo contrast showed-cute nondisplaced fracture involving the lateral aspect of the right pubic ramus and acute fracture of the left pubic bone with associated fracture of the left inferior pubic ramus. -Due to history of fall CT head obtained which ruled out any acute intracranial pathology.  Small vessel white matter disease and old lacunar infarction on the right caudate unchanged. -CT cervical spine no acute intracranial pathology, no fracture or subluxation of cervical spine and mild multilevel cervical disc degenerative disease. -ED physician has reached out to on call  orthopedics Dr. who advises these are stable fractures which would  not require surgical management.  He is happy to consult on the patient if she is admitted.  Patient does have history of left hip hemiarthroplasty with Dr. Ophelia Charter in 2023.  -Given patient does not need any surgical intervention plan is to continue pain management, consult with PT and OT for evaluation. -Continue supplemental oxygen O2 sat able 94%, good pain control for healing of the ribs.  Continue incentive spirometry. - Continue fall precaution - Continue Percocet 1 to 2 tablet every 6 hours as needed for moderate to severe pain and Tylenol 6 mg every 6 hours as needed for mild pain. -Patient's daughter reported that she lives in an assisted living facility however they are looking for any skilled nursing facility for her.  Consulted with transition care team for skilled nursing facility placement.    Symptomatic UTI History of recurrent UTI Leukocytosis -Patient is recurrent UTI.  Currently she was treating outpatient with Bactrim then changed to Macrobid 1 day ago.  She is complaining about dysuria. -Obtaining UA and urine culture. -Slight elevated WBC count 13.3 likely reactive in the setting of acute fracture and UTI.  Patient is afebrile and hemodynamically stable. - Patient got ceftriaxone 1 g in the ED.  Plan to continue ceftriaxone 1 g daily until in urine culture guide for appropriate antibiotic management. -Continue to monitor CBC  AKI on CKD stage 3A -Slightly elevated creatinine 1.29.  Podalic any setting of oral intake and IV contrast exposure. - Continue IV fluid LR 75 cc/h for 1 day and encourage oral hydration. - Baseline GFR in between 42 to  68 -Avoiding to toxic agents when possible. - Continue monitoring urine output   History of depression -Resume home fluoxetine 20 mg daily.  DVT prophylaxis: Lovenox 30 mg daily Code Status: Full Code.  Patient's son wanted all the aggressive treatment and resuscitation for her and she is full code. Diet: Regular diet Family  Communication: Patient's daughter and son-in-law at the bedside. Disposition Plan: Plan to discharge in 1 to 2 days to a skilled nursing facility.  Waiting for PT and OT evaluation Consults: Orthopedic surgeon, PT, OT and TOC Admission status: Observation, Med-Surg  Severity of Illness: The appropriate patient status for this patient is OBSERVATION. Observation status is judged to be reasonable and necessary in order to provide the required intensity of service to ensure the patient's safety. The patient's presenting symptoms, physical exam findings, and initial radiographic and laboratory data in the context of their medical condition is felt to place them at decreased risk for further clinical deterioration. Furthermore, it is anticipated that the patient will be medically stable for discharge from the hospital within 2 midnights of admission.     Tereasa Coop MD [facility]  How to contact the [facility] Attending or Consulting provider 7A - 7P or covering provider during after hours 7P -7A, for this patient?   Check the care team in [facility] and look for a) attending/consulting [facility] provider listed and b) the [facility] team listed Log into [email or web address] and use [facility]'s universal password to access. If you do not have the password, please contact the hospital operator. Locate the [facility] provider you are looking for under [facility] and page to a number that you can be directly reached. If you still have difficulty reaching the provider, please page the [facility] (Director on Call) for the Hospitalists listed on amion for assistance.  12/23/2022, 11:18 PM

## 2022-12-23 NOTE — ED Triage Notes (Signed)
Pt via [facility] from [facility] for further eval of UTI. Recent dx approx 1wk ago, on bactrim for same, last night was switched to macrobid. Family concerned for no improvement in symptoms

## 2022-12-23 NOTE — ED Notes (Signed)
Pt instructed to call when has to use bathroom,aware we need a urine specimen. BSC at the bedside.

## 2022-12-24 ENCOUNTER — Other Ambulatory Visit: Payer: Self-pay

## 2022-12-24 DIAGNOSIS — S32591A Other specified fracture of right pubis, initial encounter for closed fracture: Secondary | ICD-10-CM | POA: Diagnosis not present

## 2022-12-24 DIAGNOSIS — I10 Essential (primary) hypertension: Secondary | ICD-10-CM | POA: Diagnosis not present

## 2022-12-24 DIAGNOSIS — S3282XD Multiple fractures of pelvis without disruption of pelvic ring, subsequent encounter for fracture with routine healing: Secondary | ICD-10-CM | POA: Diagnosis not present

## 2022-12-24 DIAGNOSIS — S72009D Fracture of unspecified part of neck of unspecified femur, subsequent encounter for closed fracture with routine healing: Secondary | ICD-10-CM | POA: Diagnosis not present

## 2022-12-24 DIAGNOSIS — N179 Acute kidney failure, unspecified: Secondary | ICD-10-CM | POA: Diagnosis not present

## 2022-12-24 LAB — URINALYSIS, W/ REFLEX TO CULTURE (INFECTION SUSPECTED)
Bilirubin Urine: NEGATIVE
Glucose, UA: NEGATIVE mg/dL
Hgb urine dipstick: NEGATIVE
Ketones, ur: 15 mg/dL — AB
Nitrite: NEGATIVE
Protein, ur: NEGATIVE mg/dL
Specific Gravity, Urine: 1.02 (ref 1.005–1.030)
pH: 6 (ref 5.0–8.0)

## 2022-12-24 LAB — COMPREHENSIVE METABOLIC PANEL
ALT: 15 U/L (ref 0–44)
AST: 26 U/L (ref 15–41)
Albumin: 3.1 g/dL — ABNORMAL LOW (ref 3.5–5.0)
Alkaline Phosphatase: 187 U/L — ABNORMAL HIGH (ref 38–126)
Anion gap: 9 (ref 5–15)
BUN: 27 mg/dL — ABNORMAL HIGH (ref 8–23)
CO2: 17 mmol/L — ABNORMAL LOW (ref 22–32)
Calcium: 8.7 mg/dL — ABNORMAL LOW (ref 8.9–10.3)
Chloride: 109 mmol/L (ref 98–111)
Creatinine, Ser: 1.15 mg/dL — ABNORMAL HIGH (ref 0.44–1.00)
GFR, Estimated: 44 mL/min — ABNORMAL LOW (ref >60)
Glucose, Bld: 105 mg/dL — ABNORMAL HIGH (ref 70–99)
Potassium: 3.9 mmol/L (ref 3.5–5.1)
Sodium: 135 mmol/L (ref 135–145)
Total Bilirubin: 0.8 mg/dL (ref 0.3–1.2)
Total Protein: 6.3 g/dL — ABNORMAL LOW (ref 6.5–8.1)

## 2022-12-24 LAB — CBC
HCT: 31 % — ABNORMAL LOW (ref 36.0–46.0)
Hemoglobin: 10 g/dL — ABNORMAL LOW (ref 12.0–15.0)
MCH: 31.7 pg (ref 26.0–34.0)
MCHC: 32.3 g/dL (ref 30.0–36.0)
MCV: 98.4 fL (ref 80.0–100.0)
Platelets: 234 10*3/uL (ref 150–400)
RBC: 3.15 MIL/uL — ABNORMAL LOW (ref 3.87–5.11)
RDW: 13.8 % (ref 11.5–15.5)
WBC: 11.4 10*3/uL — ABNORMAL HIGH (ref 4.0–10.5)
nRBC: 0 % (ref 0.0–0.2)

## 2022-12-24 MED ORDER — HYDRALAZINE HCL 25 MG PO TABS
25.0000 mg | ORAL_TABLET | Freq: Three times a day (TID) | ORAL | Status: DC | PRN
  Administered 2022-12-26 – 2022-12-27 (×2): 25 mg via ORAL
  Filled 2022-12-24 (×2): qty 1

## 2022-12-24 NOTE — Evaluation (Signed)
Physical Therapy Evaluation Patient Details Name: Evelyn [name] MRN: [id number] DOB: 1924/08/07 Today's Date: 12/24/2022  History of Present Illness  The pt is a 89 yo female presenting 7/4 with AMS and recent fall. Pt found to have UTI, nondisplaced fracture of R superior pubic ramus and inferior pubic ramus, and nondisplaced fx of L sacrum. PMH includes: recurrent UTI, HTN, HLD, CKD III, L hip replacement 2023, R eye blindness, and chronic back pain.   Clinical Impression  Pt in bed upon arrival of PT, agreeable to evaluation at this time. Prior to admission the pt was independent with mobility when using a RW, reports no other falls in last 6 months. Reports she is typically ambulatory at her ALF, walking to dining room for meals. The pt was able to complete bed mobility with cues but little physical assist today, but did need modA to complete sit-stand from EOB due to pain and weakness. Ambulation limited to ~5 ft with minG and use of RW due to pain, primarily in R hip with stance. The pt's safety with mobility is also impacted by AMS, pt with poor short term memory and A&O x2 a this time, stating month as February and unable to name state, city, or hospital name. She benefits from increased cues and demos poor memory of information given at start of session. The pt is motivated to return to independence, will benefit from continued inpatient therapies prior to return to facility to facilitate return to greatest level of independence prior to return. Pt in agreement with this plan.     Assistance Recommended at Discharge Frequent or constant Supervision/Assistance  If plan is discharge home, recommend the following:  Can travel by private vehicle  A lot of help with walking and/or transfers;A lot of help with bathing/dressing/bathroom;Assistance with cooking/housework;Direct supervision/assist for medications management;Assist for transportation;Help with stairs or ramp for entrance   Yes     Equipment Recommendations None recommended by PT  Recommendations for Other Services       Functional Status Assessment Patient has had a recent decline in their functional status and demonstrates the ability to make significant improvements in function in a reasonable and predictable amount of time.     Precautions / Restrictions Precautions Precautions: Fall Restrictions Weight Bearing Restrictions: Yes RLE Weight Bearing: Weight bearing as tolerated LLE Weight Bearing: Weight bearing as tolerated      Mobility  Bed Mobility Overal bed mobility: Needs Assistance Bed Mobility: Supine to Sit     Supine to sit: Supervision     General bed mobility comments: increased time and cueing for hand placement, scooting to EOB    Transfers Overall transfer level: Needs assistance Equipment used: Rolling walker (2 wheels) Transfers: Sit to/from Stand, Bed to chair/wheelchair/BSC Sit to Stand: Mod assist   Step pivot transfers: Min guard       General transfer comment: mod A to power up to standing from EOB with max cues for hand placement. no overt buckling or LOB in stance but modA to rise. minG for stepping    Ambulation/Gait Ambulation/Gait assistance: Min guard Gait Distance (Feet): 5 Feet Assistive device: Rolling walker (2 wheels) Gait Pattern/deviations: Step-to pattern, Decreased weight shift to right, Shuffle Gait velocity: decreased Gait velocity interpretation: <1.31 ft/sec, indicative of household ambulator   General Gait Details: small steps with minimal clearance and heavy dependence on BUE support. minimal wt shift onto R    Balance Overall balance assessment: Needs assistance, History of Falls Sitting-balance support: Feet supported  Sitting balance-Leahy Scale: Fair Sitting balance - Comments: able to reach outside BOS slightly   Standing balance support: During functional activity, Reliant on assistive device for balance Standing balance-Leahy Scale:  Poor Standing balance comment: requires RW for support, not able to balance unsupported                             Pertinent Vitals/Pain Pain Assessment Pain Assessment: 0-10 Pain Score: 6  Pain Location: no pain at rest, 5-6/10 after mobility, reports 10/10 during gait Pain Descriptors / Indicators: Aching, Discomfort Pain Intervention(s): Limited activity within patient's tolerance, Monitored during session, Repositioned    Home Living Family/patient expects to be discharged to:: Assisted living                 Home Equipment: Agricultural consultant (2 wheels)      Prior Function Prior Level of Function : Needs assist;History of Falls (last six months)             Mobility Comments: pt reports use of RW, can walk ~5 min to get food from cafeteria ADLs Comments: pt reports staff assists with showers     Hand Dominance   Dominant Hand: Right    Extremity/Trunk Assessment   Upper Extremity Assessment Upper Extremity Assessment: Defer to OT evaluation    Lower Extremity Assessment Lower Extremity Assessment: Generalized weakness (limited by pain with hip movements but able to complete AROM against gravity. reports sensation intact)    Cervical / Trunk Assessment Cervical / Trunk Assessment: Kyphotic  Communication   Communication: No difficulties  Cognition Arousal/Alertness: Awake/alert Behavior During Therapy: WFL for tasks assessed/performed Overall Cognitive Status: Impaired/Different from baseline Area of Impairment: Orientation, Memory, Safety/judgement, Awareness, Problem solving                 Orientation Level: Disoriented to, Time, Place (pt originally stating month as february, but does know yesterday was 4th of july. unable to recall hospital or city)   Memory: Decreased recall of precautions, Decreased short-term memory   Safety/Judgement: Decreased awareness of safety Awareness: Intellectual Problem Solving: Slow processing,  Requires verbal cues General Comments: Pt unable to state birth year at first, was able to by end of session. Pt stated it was February 2024, but knew independence day was yesterday. Pt oriented to situation/self, but not to location or date.        General Comments General comments (skin integrity, edema, etc.): VSS on RA        Assessment/Plan    PT Assessment Patient needs continued PT services  PT Problem List Decreased strength;Decreased activity tolerance;Decreased balance;Pain;Decreased mobility       PT Treatment Interventions DME instruction;Gait training;Stair training;Therapeutic activities;Functional mobility training;Balance training;Therapeutic exercise;Patient/family education    PT Goals (Current goals can be found in the Care Plan section)  Acute Rehab PT Goals Patient Stated Goal: retrun to independence PT Goal Formulation: With patient Time For Goal Achievement: 01/07/23 Potential to Achieve Goals: Good    Frequency Min 3X/week        AM-PAC PT "6 Clicks" Mobility  Outcome Measure Help needed turning from your back to your side while in a flat bed without using bedrails?: A Little Help needed moving from lying on your back to sitting on the side of a flat bed without using bedrails?: A Little Help needed moving to and from a bed to a chair (including a wheelchair)?: A Little Help needed standing up  from a chair using your arms (e.g., wheelchair or bedside chair)?: A Lot Help needed to walk in hospital room?: Total (<20 ft) Help needed climbing 3-5 steps with a railing? : Total 6 Click Score: 13    End of Session Equipment Utilized During Treatment: Gait belt Activity Tolerance: Patient tolerated treatment well;Patient limited by pain Patient left: in chair;with call bell/phone within reach;with chair alarm set Nurse Communication: Mobility status PT Visit Diagnosis: Unsteadiness on feet (R26.81);Muscle weakness (generalized) (M62.81);Pain Pain -  Right/Left: Right Pain - part of body: Hip    Time: 1610-9604 PT Time Calculation (min) (ACUTE ONLY): 34 min   Charges:   PT Evaluation $PT Eval Low Complexity: 1 Low   PT General Charges $$ ACUTE PT VISIT: 1 Visit         Vickki Muff, PT, DPT   Acute Rehabilitation Department Office [phone] Secure Chat Communication Preferred  Ronnie Derby 12/24/2022, 2:01 PM

## 2022-12-24 NOTE — Consult Note (Signed)
ORTHOPAEDIC CONSULTATION  REQUESTING PHYSICIAN: Kathlen Mody, MD  Chief Complaint: Difficulty with ambulation  HPI: Evelyn [name] is a 89 y.o. female who presents with right SPR IPR fractures in the setting of acute confusion just taken to the emergency room by her daughter who noticed a change in her mental status.  Her daughter notes that she has been progressively more limited in ambulation over the last several weeks.  She has recently been diagnosed with a UTI for which she is on Bactrim.  She has a baseline assisted living facility ambulator  Past Medical History:  Diagnosis Date   Allergy    Arthritis    Chronic cystitis    Congenital blindness    right eye   Depression    Gastric ulcer    H/O: hematuria    Hepatitis, unspecified    statin related, resolved quickly   Hyperlipidemia    Legally blind in right eye, as defined in [place]    Syncope and collapse 04/01/2014   UTI (lower urinary tract infection)    HISTORY OF UTI   Past Surgical History:  Procedure Laterality Date   ABDOMINAL HYSTERECTOMY     vaginal   BLADDER SUSPENSION     A-P   CATARACT EXTRACTION W/ INTRAOCULAR LENS IMPLANT Left    ESOPHAGOGASTRODUODENOSCOPY     HIP ARTHROPLASTY Left 12/17/2021   Procedure: ARTHROPLASTY BIPOLAR HIP (HEMIARTHROPLASTY);  Surgeon: Eldred Manges, MD;  Location: [facility];  Service: Orthopedics;  Laterality: Left;   OPEN REDUCTION INTERNAL FIXATION (ORIF) DISTAL RADIAL FRACTURE Right 03/09/2016   Procedure: OPEN REDUCTION INTERNAL FIXATION (ORIF) DISTAL RADIAL FRACTURE;  Surgeon: Betha Loa, MD;  Location: [facility];  Service: Orthopedics;  Laterality: Right;   Social History   Socioeconomic History   Marital status: Widowed    Spouse name: Not on file   Number of children: Not on file   Years of education: Not on file   Highest education level: Not on file  Occupational History   Occupation: Retired  Tobacco Use   Smoking status: Never   Smokeless  tobacco: Never  Vaping Use   Vaping Use: Never used  Substance and Sexual Activity   Alcohol use: No   Drug use: No   Sexual activity: Not Currently  Other Topics Concern   Not on file  Social History Narrative   Per [facility] New Patient Packet Abstracted on 12/09/2020:      Diet: Normal       Caffeine: Tea      Married, if yes what year: Widowed, married in 1951      Do you live in a house, apartment, assisted living, condo, trailer, ect: House      Is it one or more stories: More       How many persons live in your home? 1      Pets: No      Highest level or education completed: BA      Current/Past profession: Teacher      Exercise:       Left blank            Type and how often: Left Blank          Living Will: Yes   DNR: Yes   POA/HPOA: Yes      Functional Status:   Do you have difficulty bathing or dressing yourself? No   Do you have difficulty preparing food or eating? No   Do you have  difficulty managing your medications? No   Do you have difficulty managing your finances? No   Do you have difficulty affording your medications? No   Social Determinants of Health   Financial Resource Strain: Low Risk  (09/09/2020)   Overall Financial Resource Strain (CARDIA)    Difficulty of Paying Living Expenses: Not hard at all  Food Insecurity: No Food Insecurity (12/24/2022)   Hunger Vital Sign    Worried About Running Out of Food in the Last Year: Never true    Ran Out of Food in the Last Year: Never true  Transportation Needs: No Transportation Needs (12/24/2022)   PRAPARE - Administrator, Civil Service (Medical): No    Lack of Transportation (Non-Medical): No  Physical Activity: Sufficiently Active (09/09/2020)   Exercise Vital Sign    Days of Exercise per Week: 5 days    Minutes of Exercise per Session: 30 min  Stress: No Stress Concern Present (09/09/2020)   [employer] of Occupational Health - Occupational Stress Questionnaire    Feeling of Stress  : Not at all  Social Connections: Moderately Integrated (09/09/2020)   Social Connection and Isolation Panel [NHANES]    Frequency of Communication with Friends and Family: More than three times a week    Frequency of Social Gatherings with Friends and Family: More than three times a week    Attends Religious Services: 1 to 4 times per year    Active Member of [employer] or Organizations: No    Attends Banker Meetings: 1 to 4 times per year    Marital Status: Widowed   Family History  Problem Relation Age of Onset   Cancer Neg Hx        Colon cancer   Stroke Neg Hx    Heart disease Neg Hx    Hyperlipidemia Neg Hx    Hypertension Neg Hx    Kidney disease Neg Hx    Colon polyps Neg Hx    Colon cancer Neg Hx    - negative except otherwise stated in the family history section No Known Allergies Prior to Admission medications   Medication Sig Start Date End Date Taking? Authorizing Provider  acetaminophen (TYLENOL) 500 MG tablet Take 500 mg by mouth in the morning and at bedtime.   Yes [provider]  aspirin EC 81 MG tablet Take 81 mg by mouth daily. Swallow whole.   Yes [provider]  HYDROcodone-acetaminophen (NORCO/VICODIN) 5-325 MG tablet Take 1 tablet by mouth every 6 (six) hours as needed for moderate pain. Patient taking differently: Take 0.5 tablets by mouth in the morning, at noon, and at bedtime. 12/18/21  Yes Naida Sleight, PA-C  lidocaine 4 % Place 1 patch onto the skin daily.   Yes [provider]  pantoprazole (PROTONIX) 40 MG tablet Take 1 tablet (40 mg total) by mouth daily before breakfast. 11/10/21  Yes Gherghe, Daylene Katayama, MD  cholecalciferol (VITAMIN D3) 25 MCG (1000 UNIT) tablet Take 1,000 Units by mouth daily.    [provider]  CRANBERRY-VITAMIN C-D MANNOSE PO Take 1 capsule by mouth daily.    [provider]  docusate sodium (COLACE) 100 MG capsule Take 100 mg by mouth every other day.    [provider]  FLUoxetine (PROZAC) 20 MG capsule Take 20 mg by mouth daily.    [provider]  folic acid (FOLVITE) 1 MG tablet Take 1 mg by mouth daily.    [provider]  Lactobacillus (ULTIMATE PROBIOTIC FORMULA) CAPS Take 1 capsule by mouth daily.    [provider]  Multiple Vitamin (DAILY VITE PO) Take 1 tablet by mouth daily.    [provider]  polyethylene glycol (MIRALAX / GLYCOLAX) 17 g packet Take 17 g by mouth 2 (two) times a week. Tuesday's and Friday's    [provider]  zolpidem (AMBIEN) 5 MG tablet Take 5 mg by mouth at bedtime.    [provider]   DG Ribs Unilateral W/Chest Right  Result Date: 12/23/2022 CLINICAL DATA:  Status post fall Thursday.  Right rib pain. EXAM: RIGHT RIBS AND CHEST - 3+ VIEW COMPARISON:  None Available. FINDINGS: Bilateral chronic interstitial thickening. No focal consolidation. No pleural effusion or pneumothorax. Heart and mediastinal contours are unremarkable. Large hiatal hernia. Subacute-chronic multiple right posterior rib fractures involving the right posterior third, fourth, fifth, sixth, seventh and eighth ribs. Old left posterior fifth through ninth ribs. IMPRESSION: 1. Subacute-chronic multiple right posterior rib fractures involving the right posterior third, fourth, fifth, sixth, seventh and eighth ribs. Electronically Signed   By: Elige Ko M.D.   On: 12/23/2022 16:41   DG Hip Unilat W or Wo Pelvis 2-3 Views Right  Result Date: 12/23/2022 CLINICAL DATA:  Fall.  Pain. EXAM: DG HIP (WITH OR WITHOUT PELVIS) 2-3V RIGHT COMPARISON:  12/31/2021 FINDINGS: Acute fractures of the right superior inferior pubic rami evident. Acute left pubic bone fracture noted. Advanced degenerative changes in the left hip without evidence for left femoral neck fracture. IMPRESSION: 1. Acute fractures of the right superior and inferior pubic rami. 2. Acute left pubic bone fracture. Electronically Signed   By: Kennith Center  M.D.   On: 12/23/2022 16:41   CT PELVIS WO CONTRAST  Result Date: 12/23/2022 CLINICAL DATA:  Follow-up bilateral hip pain. EXAM: CT PELVIS WITHOUT CONTRAST TECHNIQUE: Multidetector CT imaging of the pelvis was performed following the standard protocol without intravenous contrast. RADIATION DOSE REDUCTION: This exam was performed according to the departmental dose-optimization program which includes automated exposure control, adjustment of the mA and/or kV according to patient size and/or use of iterative reconstruction technique. COMPARISON:  CT stone study 04/02/2022 FINDINGS: Urinary Tract:  Unremarkable. Bowel:  Choose 1 Vascular/Lymphatic: Atherosclerotic calcification distal aorta and common iliac arteries. No pelvic sidewall lymphadenopathy. Reproductive:  There is no adnexal mass. Other:  No intraperitoneal free fluid. Musculoskeletal: Status post left hip hemiarthroplasty. Probable insufficiency fracture of the right sacral ala. Nondisplaced acute appearing fracture identified in the left sacrum (see coronal 45/6 and axial 33/3). There is an acute nondisplaced fracture involving the lateral aspect of the right superior pubic ramus near the acetabulum with acute fracture involving the mid aspect of the right inferior pubic ramus. Acute fracture noted left pubic bone (axial 101/3) with an associated fracture of the left inferior pubic ramus, age indeterminate. Advanced degenerative changes noted in the right hip without evidence for right femoral neck fracture. The entire femoral component of the left hip replacement has not been included on the study. IMPRESSION: 1. Acute nondisplaced fracture involving the lateral aspect of the right superior pubic ramus near the acetabulum with acute fracture involving the mid aspect of the right inferior pubic ramus. 2. Acute fracture of the left pubic bone with an associated fracture of the left inferior pubic ramus, age indeterminate. 3. Nondisplaced acute appearing  fracture of the left sacrum. 4. Fracture of the right sacral ala is likely a chronic insufficiency fracture. 5. Advanced degenerative changes in the  right hip without evidence for right femoral neck fracture. 6.  Aortic Atherosclerosis (ICD10-I70.0). Electronically Signed   By: Kennith Center M.D.   On: 12/23/2022 16:39   DG Hip Unilat W or Wo Pelvis 2-3 Views Left  Result Date: 12/23/2022 CLINICAL DATA:  Status post fall.  Hip pain. EXAM: DG HIP (WITH OR WITHOUT PELVIS) 2-3V LEFT COMPARISON:  01/29/2022 FINDINGS: Bones are diffusely demineralized. SI joints unremarkable. There is an acute fracture of the right superior pubic ramus near the acetabulum with cortical disruption in the inferior right pubic ramus. These findings are new since prior. Possible acute fracture involving the left pubic bone. Degenerative changes noted right hip. Patient is status post left hip hemiarthroplasty without complicating features. IMPRESSION: 1. Acute fractures of the right superior and inferior pubic rami. 2. Possible acute fracture involving the left pubic bone. Cortical irregularity in the inferior left pubic pubic ramus also raises the question of fracture. CT imaging could be used to further evaluate as clinically warranted. 3. Status post left hip hemiarthroplasty without complicating features. Electronically Signed   By: Kennith Center M.D.   On: 12/23/2022 16:32   CT Head Wo Contrast  Result Date: 12/23/2022 CLINICAL DATA:  Head and neck trauma EXAM: CT HEAD WITHOUT CONTRAST CT CERVICAL SPINE WITHOUT CONTRAST TECHNIQUE: Multidetector CT imaging of the head and cervical spine was performed following the standard protocol without intravenous contrast. Multiplanar CT image reconstructions of the cervical spine were also generated. RADIATION DOSE REDUCTION: This exam was performed according to the departmental dose-optimization program which includes automated exposure control, adjustment of the mA and/or kV according to  patient size and/or use of iterative reconstruction technique. COMPARISON:  04/02/2022 FINDINGS: CT HEAD FINDINGS Brain: No evidence of acute infarction, hemorrhage, hydrocephalus, extra-axial collection or mass lesion/mass effect. Periventricular white matter hypodensity. Lacunar infarct of the right caudate, unchanged. Vascular: No hyperdense vessel or unexpected calcification. Skull: Normal. Negative for fracture or focal lesion. Sinuses/Orbits: No acute finding. Other: None. CT CERVICAL SPINE FINDINGS Alignment: Normal. Skull base and vertebrae: No acute fracture. No primary bone lesion or focal pathologic process. Soft tissues and spinal canal: No prevertebral fluid or swelling. No visible canal hematoma. Disc levels: Mild multilevel disc space height loss and osteophytosis. Upper chest: Negative. Other: None. IMPRESSION: 1. No acute intracranial pathology. 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 2. No fracture or static subluxation of the cervical spine. 3. Mild multilevel cervical disc degenerative disease. Electronically Signed   By: Jearld Lesch M.D.   On: 12/23/2022 16:25   CT Cervical Spine Wo Contrast  Result Date: 12/23/2022 CLINICAL DATA:  Head and neck trauma EXAM: CT HEAD WITHOUT CONTRAST CT CERVICAL SPINE WITHOUT CONTRAST TECHNIQUE: Multidetector CT imaging of the head and cervical spine was performed following the standard protocol without intravenous contrast. Multiplanar CT image reconstructions of the cervical spine were also generated. RADIATION DOSE REDUCTION: This exam was performed according to the departmental dose-optimization program which includes automated exposure control, adjustment of the mA and/or kV according to patient size and/or use of iterative reconstruction technique. COMPARISON:  04/02/2022 FINDINGS: CT HEAD FINDINGS Brain: No evidence of acute infarction, hemorrhage, hydrocephalus, extra-axial collection or mass  lesion/mass effect. Periventricular white matter hypodensity. Lacunar infarct of the right caudate, unchanged. Vascular: No hyperdense vessel or unexpected calcification. Skull: Normal. Negative for fracture or focal lesion. Sinuses/Orbits: No acute finding. Other: None. CT CERVICAL SPINE FINDINGS Alignment: Normal. Skull base and vertebrae: No acute fracture. No  primary bone lesion or focal pathologic process. Soft tissues and spinal canal: No prevertebral fluid or swelling. No visible canal hematoma. Disc levels: Mild multilevel disc space height loss and osteophytosis. Upper chest: Negative. Other: None. IMPRESSION: 1. No acute intracranial pathology. 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 2. No fracture or static subluxation of the cervical spine. 3. Mild multilevel cervical disc degenerative disease. Electronically Signed   By: Jearld Lesch M.D.   On: 12/23/2022 16:25     Positive ROS: All other systems have been reviewed and were otherwise negative with the exception of those mentioned in the HPI and as above.  Physical Exam: General: No acute distress Cardiovascular: No pedal edema Respiratory: No cyanosis, no use of accessory musculature GI: No organomegaly, abdomen is soft and non-tender Skin: No lesions in the area of chief complaint Neurologic: Sensation intact distally Psychiatric: Patient is at baseline mood and affect Lymphatic: No axillary or cervical lymphadenopathy  MUSCULOSKELETAL:  With exam due to mental status.  No pain with gentle logroll of either hip.  Previous left hip incision is well-appearing.  Limb lengths are equal.  2+ dorsalis pedis pulse distally bilaterally  Independent Imaging Review: CT scan pelvis: Minimally displaced right SPR/IPR fractures with an old appearing left superior ramus fracture  Assessment: With a lateral compression type injury involving the right side.  Overall she may continue to ambulate  and weight-bear as tolerated.  This will heal nicely without surgery.  She may be weight-bear as tolerated on the leg.  She will likely need physical therapy to assist with ambulation and mobilization.  Plan: She may follow-up in the outpatient setting on discharge 1 month after discharge  Thank you for the consult and the opportunity to see Ms. Harpster  Huel Cote, MD [facility] 6:54 AM

## 2022-12-24 NOTE — Progress Notes (Signed)
[facility]                                                                               Evelyn [name], is a 89 y.o. female, DOB - [birth year], ZOX:[id number] Admit date - 12/23/2022    Outpatient Primary MD for the patient is Frederica Kuster, MD  LOS - 1  days    Brief summary   89 year old female medical history of recurrent UTI, hypertension, hyperlipidemia, CKD stage 3A, left hip replacement 12/17/2021, cognitive impairment, right-sided eye blindness, hiatal hernia, chronic abdominal and back pain who initially presented to emergency department at [facility] for evaluation of urinary tract infection as well as recent fall.   CT pelvis wo contrast showed-cute nondisplaced fracture involving the lateral aspect of the right pubic ramus and acute fracture of the left pubic bone with associated fracture of the left inferior pubic ramus.   Assessment & Plan    Assessment and Plan:   Bilateral acute non displaced rib fracture/ Non displaced posterior rib fracture from mechanical fall: CT pelvis shows Acute nondisplaced fracture involving the lateral aspect of the right superior pubic ramus near the acetabulum with acute fracture involving the mid aspect of the right inferior pubic ramus. Acute fracture of the left pubic bone with an associated fracture of the left inferior pubic ramus, age indeterminate. Nondisplaced acute appearing fracture of the left sacrum. - orthopedics consulted , recommended non surgical management.  - pain control and therapy evaluations ordered.     Symptomatic UTI:  - Continue with rocephin and follow up urine cultures.   Hypertension  Well controlled.   Acute on stage 3 a CKD:  Continue with gentle hydration.  Creatinine improved to 1.15.     Anemia of chronic disease:  Hemoglobin stable around 10.    Depression:  Continue with fluoxetine 20 mg daily.      Estimated body mass index is 19.18 kg/m as calculated from the  following:   Height as of 04/02/22: 5\' 3"  (1.6 m).   Weight as of this encounter: 49.1 kg.  Code Status: full code.  DVT Prophylaxis:  enoxaparin (LOVENOX) injection 30 mg Start: 12/24/22 1000 SCDs Start: 12/23/22 2124   Level of Care: Level of care: Med-Surg Family Communication: none at bedside.   Disposition Plan:     Remains inpatient appropriate:  pending PT evaluation.   Procedures:  None.   Consultants:   Orthopedics.   Antimicrobials:   Anti-infectives (From admission, onward)    Start     Dose/Rate Route Frequency Ordered Stop   12/24/22 1000  cefTRIAXone (ROCEPHIN) 1 g in sodium chloride 0.9 % 100 mL IVPB        1 g 200 mL/hr over 30 Minutes Intravenous Every 24 hours 12/23/22 2147 12/28/22 0959   12/23/22 2230  cephALEXin (KEFLEX) capsule 500 mg  Status:  Discontinued        500 mg Oral Every 12 hours 12/23/22 2130 12/23/22 2147   12/23/22 1530  cefTRIAXone (ROCEPHIN) 1 g in sodium chloride 0.9 % 100 mL IVPB        1 g 200 mL/hr over 30 Minutes Intravenous  Once 12/23/22 1521 12/23/22  1704        Medications  Scheduled Meds:  acidophilus  1 capsule Oral Daily   aspirin EC  81 mg Oral Daily   cholecalciferol  1,000 Units Oral Daily   docusate sodium  100 mg Oral QODAY   enoxaparin (LOVENOX) injection  30 mg Subcutaneous Q24H   FLUoxetine  20 mg Oral Daily   folic acid  1 mg Oral Daily   multivitamin with minerals  1 tablet Oral Daily   pantoprazole  40 mg Oral QAC breakfast   [START ON 12/27/2022] polyethylene glycol  17 g Oral Once per day on Mon Thu   sodium chloride flush  3 mL Intravenous Q12H   sodium chloride flush  3 mL Intravenous Q12H   Continuous Infusions:  sodium chloride 10 mL/hr at 12/23/22 1633   sodium chloride     cefTRIAXone (ROCEPHIN)  IV 1 g (12/24/22 0833)   PRN Meds:.sodium chloride, sodium chloride, acetaminophen **OR** acetaminophen, hydrALAZINE, lip balm, ondansetron **OR** ondansetron (ZOFRAN) IV, oxyCODONE-acetaminophen,  sodium chloride flush    Subjective:   Evelyn [name] was seen and examined today.  Reports not able to urinate.  Pelvic pain.   Objective:   Vitals:   12/24/22 0538 12/24/22 0956 12/24/22 1200 12/24/22 1442  BP: (!) 152/65 (!) 183/65 (!) 159/65 112/65  Pulse:  66 70 83  Resp: 18 16  16   Temp: 98.1 F (36.7 C) 97.9 F (36.6 C)  98.1 F (36.7 C)  TempSrc: Oral   Oral  SpO2: 95% 97%  95%  Weight:        Intake/Output Summary (Last 24 hours) at 12/24/2022 1753 Last data filed at 12/24/2022 1600 Gross per 24 hour  Intake 688.43 ml  Output 300 ml  Net 388.43 ml   Filed Weights   12/24/22 0500  Weight: 49.1 kg     Exam General exam: Appears calm and comfortable  Respiratory system: Clear to auscultation. Respiratory effort normal. Cardiovascular system: S1 & S2 heard, RRR. No JVD, Gastrointestinal system: Abdomen is nondistended, soft and nontender.  Central nervous system: Alert and oriented. No focal neurological deficits. Extremities: Symmetric 5 x 5 power. Skin: No rashes,  Psychiatry: Mood & affect appropriate.     Data Reviewed:  I have personally reviewed following labs and imaging studies   CBC Lab Results  Component Value Date   WBC 11.4 (H) 12/24/2022   RBC 3.15 (L) 12/24/2022   HGB 10.0 (L) 12/24/2022   HCT 31.0 (L) 12/24/2022   MCV 98.4 12/24/2022   MCH 31.7 12/24/2022   PLT 234 12/24/2022   MCHC 32.3 12/24/2022   RDW 13.8 12/24/2022   LYMPHSABS 1.2 04/02/2022   MONOABS 0.8 04/02/2022   EOSABS 0.4 04/02/2022   BASOSABS 0.1 04/02/2022     Last metabolic panel Lab Results  Component Value Date   NA 135 12/24/2022   K 3.9 12/24/2022   CL 109 12/24/2022   CO2 17 (L) 12/24/2022   BUN 27 (H) 12/24/2022   CREATININE 1.15 (H) 12/24/2022   GLUCOSE 105 (H) 12/24/2022   GFRNONAA 44 (L) 12/24/2022   GFRAA 53 (L) 01/09/2018   CALCIUM 8.7 (L) 12/24/2022   PHOS 2.4 (L) 04/04/2022   PROT 6.3 (L) 12/24/2022   ALBUMIN 3.1 (L) 12/24/2022   LABGLOB  3.3 12/10/2021   BILITOT 0.8 12/24/2022   ALKPHOS 187 (H) 12/24/2022   AST 26 12/24/2022   ALT 15 12/24/2022   ANIONGAP 9 12/24/2022    CBG (last 3)  No results for input(s): "GLUCAP" in the last 72 hours.    Coagulation Profile: No results for input(s): "INR", "PROTIME" in the last 168 hours.   Radiology Studies: DG Ribs Unilateral W/Chest Right  Result Date: 12/23/2022 CLINICAL DATA:  Status post fall Thursday.  Right rib pain. EXAM: RIGHT RIBS AND CHEST - 3+ VIEW COMPARISON:  None Available. FINDINGS: Bilateral chronic interstitial thickening. No focal consolidation. No pleural effusion or pneumothorax. Heart and mediastinal contours are unremarkable. Large hiatal hernia. Subacute-chronic multiple right posterior rib fractures involving the right posterior third, fourth, fifth, sixth, seventh and eighth ribs. Old left posterior fifth through ninth ribs. IMPRESSION: 1. Subacute-chronic multiple right posterior rib fractures involving the right posterior third, fourth, fifth, sixth, seventh and eighth ribs. Electronically Signed   By: Elige Ko M.D.   On: 12/23/2022 16:41   DG Hip Unilat W or Wo Pelvis 2-3 Views Right  Result Date: 12/23/2022 CLINICAL DATA:  Fall.  Pain. EXAM: DG HIP (WITH OR WITHOUT PELVIS) 2-3V RIGHT COMPARISON:  12/31/2021 FINDINGS: Acute fractures of the right superior inferior pubic rami evident. Acute left pubic bone fracture noted. Advanced degenerative changes in the left hip without evidence for left femoral neck fracture. IMPRESSION: 1. Acute fractures of the right superior and inferior pubic rami. 2. Acute left pubic bone fracture. Electronically Signed   By: Kennith Center M.D.   On: 12/23/2022 16:41   CT PELVIS WO CONTRAST  Result Date: 12/23/2022 CLINICAL DATA:  Follow-up bilateral hip pain. EXAM: CT PELVIS WITHOUT CONTRAST TECHNIQUE: Multidetector CT imaging of the pelvis was performed following the standard protocol without intravenous contrast. RADIATION  DOSE REDUCTION: This exam was performed according to the departmental dose-optimization program which includes automated exposure control, adjustment of the mA and/or kV according to patient size and/or use of iterative reconstruction technique. COMPARISON:  CT stone study 04/02/2022 FINDINGS: Urinary Tract:  Unremarkable. Bowel:  Choose 1 Vascular/Lymphatic: Atherosclerotic calcification distal aorta and common iliac arteries. No pelvic sidewall lymphadenopathy. Reproductive:  There is no adnexal mass. Other:  No intraperitoneal free fluid. Musculoskeletal: Status post left hip hemiarthroplasty. Probable insufficiency fracture of the right sacral ala. Nondisplaced acute appearing fracture identified in the left sacrum (see coronal 45/6 and axial 33/3). There is an acute nondisplaced fracture involving the lateral aspect of the right superior pubic ramus near the acetabulum with acute fracture involving the mid aspect of the right inferior pubic ramus. Acute fracture noted left pubic bone (axial 101/3) with an associated fracture of the left inferior pubic ramus, age indeterminate. Advanced degenerative changes noted in the right hip without evidence for right femoral neck fracture. The entire femoral component of the left hip replacement has not been included on the study. IMPRESSION: 1. Acute nondisplaced fracture involving the lateral aspect of the right superior pubic ramus near the acetabulum with acute fracture involving the mid aspect of the right inferior pubic ramus. 2. Acute fracture of the left pubic bone with an associated fracture of the left inferior pubic ramus, age indeterminate. 3. Nondisplaced acute appearing fracture of the left sacrum. 4. Fracture of the right sacral ala is likely a chronic insufficiency fracture. 5. Advanced degenerative changes in the right hip without evidence for right femoral neck fracture. 6.  Aortic Atherosclerosis (ICD10-I70.0). Electronically Signed   By: Kennith Center  M.D.   On: 12/23/2022 16:39   DG Hip Unilat W or Wo Pelvis 2-3 Views Left  Result Date: 12/23/2022 CLINICAL DATA:  Status post fall.  Hip pain. EXAM:  DG HIP (WITH OR WITHOUT PELVIS) 2-3V LEFT COMPARISON:  01/29/2022 FINDINGS: Bones are diffusely demineralized. SI joints unremarkable. There is an acute fracture of the right superior pubic ramus near the acetabulum with cortical disruption in the inferior right pubic ramus. These findings are new since prior. Possible acute fracture involving the left pubic bone. Degenerative changes noted right hip. Patient is status post left hip hemiarthroplasty without complicating features. IMPRESSION: 1. Acute fractures of the right superior and inferior pubic rami. 2. Possible acute fracture involving the left pubic bone. Cortical irregularity in the inferior left pubic pubic ramus also raises the question of fracture. CT imaging could be used to further evaluate as clinically warranted. 3. Status post left hip hemiarthroplasty without complicating features. Electronically Signed   By: Kennith Center M.D.   On: 12/23/2022 16:32   CT Head Wo Contrast  Result Date: 12/23/2022 CLINICAL DATA:  Head and neck trauma EXAM: CT HEAD WITHOUT CONTRAST CT CERVICAL SPINE WITHOUT CONTRAST TECHNIQUE: Multidetector CT imaging of the head and cervical spine was performed following the standard protocol without intravenous contrast. Multiplanar CT image reconstructions of the cervical spine were also generated. RADIATION DOSE REDUCTION: This exam was performed according to the departmental dose-optimization program which includes automated exposure control, adjustment of the mA and/or kV according to patient size and/or use of iterative reconstruction technique. COMPARISON:  04/02/2022 FINDINGS: CT HEAD FINDINGS Brain: No evidence of acute infarction, hemorrhage, hydrocephalus, extra-axial collection or mass lesion/mass effect. Periventricular white matter hypodensity. Lacunar infarct of  the right caudate, unchanged. Vascular: No hyperdense vessel or unexpected calcification. Skull: Normal. Negative for fracture or focal lesion. Sinuses/Orbits: No acute finding. Other: None. CT CERVICAL SPINE FINDINGS Alignment: Normal. Skull base and vertebrae: No acute fracture. No primary bone lesion or focal pathologic process. Soft tissues and spinal canal: No prevertebral fluid or swelling. No visible canal hematoma. Disc levels: Mild multilevel disc space height loss and osteophytosis. Upper chest: Negative. Other: None. IMPRESSION: 1. No acute intracranial pathology. 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 2. No fracture or static subluxation of the cervical spine. 3. Mild multilevel cervical disc degenerative disease. Electronically Signed   By: Jearld Lesch M.D.   On: 12/23/2022 16:25   CT Cervical Spine Wo Contrast  Result Date: 12/23/2022 CLINICAL DATA:  Head and neck trauma EXAM: CT HEAD WITHOUT CONTRAST CT CERVICAL SPINE WITHOUT CONTRAST TECHNIQUE: Multidetector CT imaging of the head and cervical spine was performed following the standard protocol without intravenous contrast. Multiplanar CT image reconstructions of the cervical spine were also generated. RADIATION DOSE REDUCTION: This exam was performed according to the departmental dose-optimization program which includes automated exposure control, adjustment of the mA and/or kV according to patient size and/or use of iterative reconstruction technique. COMPARISON:  04/02/2022 FINDINGS: CT HEAD FINDINGS Brain: No evidence of acute infarction, hemorrhage, hydrocephalus, extra-axial collection or mass lesion/mass effect. Periventricular white matter hypodensity. Lacunar infarct of the right caudate, unchanged. Vascular: No hyperdense vessel or unexpected calcification. Skull: Normal. Negative for fracture or focal lesion. Sinuses/Orbits: No acute finding. Other: None. CT CERVICAL SPINE FINDINGS  Alignment: Normal. Skull base and vertebrae: No acute fracture. No primary bone lesion or focal pathologic process. Soft tissues and spinal canal: No prevertebral fluid or swelling. No visible canal hematoma. Disc levels: Mild multilevel disc space height loss and osteophytosis. Upper chest: Negative. Other: None. IMPRESSION: 1. No acute intracranial pathology. Small-vessel white matter disease and old lacunar infarct of the right caudate,  unchanged. 2. No fracture or static subluxation of the cervical spine. 3. Mild multilevel cervical disc degenerative disease. Electronically Signed   By: Jearld Lesch M.D.   On: 12/23/2022 16:25       Kathlen Mody M.D. [facility] 12/24/2022, 5:53 PM  Available via Epic secure chat 7am-7pm After 7 pm, please refer to night coverage provider listed on amion.

## 2022-12-24 NOTE — Progress Notes (Signed)
Mobility Specialist Progress Note:   12/24/22 1600  Mobility  Activity Ambulated with assistance in room  Level of Assistance Contact guard assist, steadying assist  Assistive Device Front wheel walker  Distance Ambulated (ft) 20 ft  RLE Weight Bearing WBAT  LLE Weight Bearing WBAT  Activity Response Tolerated well  Mobility Referral Yes  $Mobility charge 1 Mobility  Mobility Specialist Start Time (ACUTE ONLY) 1545  Mobility Specialist Stop Time (ACUTE ONLY) 1605  Mobility Specialist Time Calculation (min) (ACUTE ONLY) 20 min    Pt received ambulating in BR with RN, agreeable to mobility. Ambulated in room w/ RW and CG. C/o of mild hip pain. MinA for bed mobility. Pt left in bed with call bell and bed alarm on.  [facility] Earlene Plater Mobility Specialist Please contact via Special educational needs teacher or Rehab office at [phone]

## 2022-12-24 NOTE — Evaluation (Signed)
Occupational Therapy Evaluation Patient Details Name: Evelyn [name] MRN: [id number] DOB: 12-15-1924 Today's Date: 12/24/2022   History of Present Illness The pt is a 89 yo female presenting 7/4 with AMS and recent fall. Pt found to have UTI, nondisplaced fracture of R superior pubic ramus and inferior pubic ramus, and nondisplaced fx of L sacrum. PMH includes: recurrent UTI, HTN, HLD, CKD III, L hip replacement 2023, R eye blindness, and chronic back pain.   Clinical Impression   Pt s/p above diagnosis. Pt lives at ALF, mod I for ADLs/mobility at baseline, some assistance for bathing, medication management, IADLs. Pt currently requires significant assistance for LB ADLs, transfers, and ambulation. Pt motivated to participate and displays good potential for rehab, would benefit greatly from continued skilled OT and postacute follow up <3hrs/day to improve functional strength and return to PLOF.     Recommendations for follow up therapy are one component of a multi-disciplinary discharge planning process, led by the attending physician.  Recommendations may be updated based on patient status, additional functional criteria and insurance authorization.   Assistance Recommended at Discharge Frequent or constant Supervision/Assistance  Patient can return home with the following A little help with walking and/or transfers;A little help with bathing/dressing/bathroom;Assistance with cooking/housework;Direct supervision/assist for medications management;Assist for transportation;Help with stairs or ramp for entrance    Functional Status Assessment  Patient has had a recent decline in their functional status and demonstrates the ability to make significant improvements in function in a reasonable and predictable amount of time.  Equipment Recommendations  Other (comment) (defer)    Recommendations for Other Services       Precautions / Restrictions Precautions Precautions:  Fall Restrictions Weight Bearing Restrictions: Yes RLE Weight Bearing: Weight bearing as tolerated LLE Weight Bearing: Weight bearing as tolerated      Mobility Bed Mobility Overal bed mobility: Needs Assistance Bed Mobility: Supine to Sit     Supine to sit: Supervision     General bed mobility comments: increased time and cueing for hand placement, scooting to EOB    Transfers Overall transfer level: Needs assistance Equipment used: Rolling walker (2 wheels) Transfers: Sit to/from Stand, Bed to chair/wheelchair/BSC Sit to Stand: Mod assist     Step pivot transfers: Min guard     General transfer comment: mod A to power STS, cueing for hand placement, increased time due to pain with transfer      Balance Overall balance assessment: Needs assistance, History of Falls Sitting-balance support: Feet supported Sitting balance-Leahy Scale: Fair Sitting balance - Comments: EOB ADLs   Standing balance support: During functional activity, Reliant on assistive device for balance Standing balance-Leahy Scale: Poor Standing balance comment: requires RW for support, not able to balance unsupported                           ADL either performed or assessed with clinical judgement   ADL Overall ADL's : Needs assistance/impaired Eating/Feeding: Independent   Grooming: Set up;Sitting   Upper Body Bathing: Min guard;Sitting   Lower Body Bathing: Moderate assistance;Sitting/lateral leans   Upper Body Dressing : Set up;Sitting   Lower Body Dressing: Maximal assistance;Sit to/from stand   Toilet Transfer: Moderate assistance;BSC/3in1;Rolling walker (2 wheels)   Toileting- Clothing Manipulation and Hygiene: Moderate assistance;Sitting/lateral lean       Functional mobility during ADLs: Min guard;Rolling walker (2 wheels) General ADL Comments: requires LB ADL assistance, increased time for functional activities, mod A for power to  STS, increased cueing for hand  placement     Vision Baseline Vision/History: 1 Wears glasses Ability to See in Adequate Light: 0 Adequate Patient Visual Report: No change from baseline       Perception     Praxis      Pertinent Vitals/Pain Pain Assessment Pain Assessment: 0-10 Pain Score: 6  Pain Location: R hip Pain Descriptors / Indicators: Aching, Discomfort Pain Intervention(s): Monitored during session     Hand Dominance Right   Extremity/Trunk Assessment Upper Extremity Assessment Upper Extremity Assessment: Overall WFL for tasks assessed   Lower Extremity Assessment Lower Extremity Assessment: Defer to PT evaluation       Communication Communication Communication: No difficulties   Cognition Arousal/Alertness: Awake/alert Behavior During Therapy: WFL for tasks assessed/performed Overall Cognitive Status: Impaired/Different from baseline Area of Impairment: Orientation, Memory, Safety/judgement, Awareness, Problem solving                 Orientation Level: Disoriented to, Time, Place       Safety/Judgement: Decreased awareness of safety     General Comments: Pt unable to state birth year at first, was able to by end of session. Pt stated it was February 2024, but knew independence day was yesterday. Pt oriented to situation/self, but not to location or date.     General Comments       Exercises     Shoulder Instructions      Home Living Family/patient expects to be discharged to:: Assisted living                             Home Equipment: Rolling Walker (2 wheels)          Prior Functioning/Environment Prior Level of Function : Needs assist;History of Falls (last six months)             Mobility Comments: pt reports use of RW, can walk ~5 min to get food from cafeteria ADLs Comments: pt reports staff assists with showers        OT Problem List: Decreased strength;Decreased range of motion;Decreased activity tolerance;Impaired balance  (sitting and/or standing);Decreased cognition;Decreased safety awareness;Decreased knowledge of use of DME or AE;Pain      OT Treatment/Interventions: Self-care/ADL training;Therapeutic exercise;Energy conservation;DME and/or AE instruction;Therapeutic activities;Cognitive remediation/compensation    OT Goals(Current goals can be found in the care plan section) Acute Rehab OT Goals Patient Stated Goal: to return to PLOF OT Goal Formulation: With patient Time For Goal Achievement: 01/07/23 Potential to Achieve Goals: Good  OT Frequency: Min 2X/week    Co-evaluation              AM-PAC OT "6 Clicks" Daily Activity     Outcome Measure Help from another person eating meals?: None Help from another person taking care of personal grooming?: A Little Help from another person toileting, which includes using toliet, bedpan, or urinal?: A Lot Help from another person bathing (including washing, rinsing, drying)?: A Lot Help from another person to put on and taking off regular upper body clothing?: A Little Help from another person to put on and taking off regular lower body clothing?: A Lot 6 Click Score: 16   End of Session Equipment Utilized During Treatment: Gait belt;Rolling walker (2 wheels) Nurse Communication: Mobility status  Activity Tolerance: Patient tolerated treatment well Patient left: in chair;with call bell/phone within reach;with chair alarm set  OT Visit Diagnosis: Unsteadiness on feet (R26.81);Other abnormalities of gait and mobility (  R26.89);Muscle weakness (generalized) (M62.81);History of falling (Z91.81);Other symptoms and signs involving cognitive function;Pain Pain - Right/Left: Right Pain - part of body: Hip                Time: 0981-1914 OT Time Calculation (min): 34 min Charges:  OT General Charges $OT Visit: 1 Visit OT Evaluation $OT Eval Moderate Complexity: 1 [address], OTR/L   Alexis Goodell 12/24/2022, 1:42 PM

## 2022-12-25 DIAGNOSIS — N390 Urinary tract infection, site not specified: Secondary | ICD-10-CM

## 2022-12-25 DIAGNOSIS — D649 Anemia, unspecified: Secondary | ICD-10-CM

## 2022-12-25 DIAGNOSIS — S3282XD Multiple fractures of pelvis without disruption of pelvic ring, subsequent encounter for fracture with routine healing: Secondary | ICD-10-CM | POA: Diagnosis not present

## 2022-12-25 LAB — BASIC METABOLIC PANEL
Anion gap: 7 (ref 5–15)
BUN: 21 mg/dL (ref 8–23)
CO2: 19 mmol/L — ABNORMAL LOW (ref 22–32)
Calcium: 8.7 mg/dL — ABNORMAL LOW (ref 8.9–10.3)
Chloride: 110 mmol/L (ref 98–111)
Creatinine, Ser: 0.95 mg/dL (ref 0.44–1.00)
GFR, Estimated: 55 mL/min — ABNORMAL LOW (ref >60)
Glucose, Bld: 99 mg/dL (ref 70–99)
Potassium: 3.9 mmol/L (ref 3.5–5.1)
Sodium: 136 mmol/L (ref 135–145)

## 2022-12-25 LAB — CBC WITH DIFFERENTIAL/PLATELET
Abs Immature Granulocytes: 0.04 10*3/uL (ref 0.00–0.07)
Basophils Absolute: 0 10*3/uL (ref 0.0–0.1)
Basophils Relative: 1 %
Eosinophils Absolute: 0.4 10*3/uL (ref 0.0–0.5)
Eosinophils Relative: 8 %
HCT: 31.8 % — ABNORMAL LOW (ref 36.0–46.0)
Hemoglobin: 10.1 g/dL — ABNORMAL LOW (ref 12.0–15.0)
Immature Granulocytes: 1 %
Lymphocytes Relative: 16 %
Lymphs Abs: 0.9 10*3/uL (ref 0.7–4.0)
MCH: 30.4 pg (ref 26.0–34.0)
MCHC: 31.8 g/dL (ref 30.0–36.0)
MCV: 95.8 fL (ref 80.0–100.0)
Monocytes Absolute: 0.6 10*3/uL (ref 0.1–1.0)
Monocytes Relative: 11 %
Neutro Abs: 3.5 10*3/uL (ref 1.7–7.7)
Neutrophils Relative %: 63 %
Platelets: 211 10*3/uL (ref 150–400)
RBC: 3.32 MIL/uL — ABNORMAL LOW (ref 3.87–5.11)
RDW: 13.6 % (ref 11.5–15.5)
WBC: 5.5 10*3/uL (ref 4.0–10.5)
nRBC: 0 % (ref 0.0–0.2)

## 2022-12-25 LAB — URINE CULTURE: Culture: NO GROWTH

## 2022-12-25 MED ORDER — ZOLPIDEM TARTRATE 5 MG PO TABS
5.0000 mg | ORAL_TABLET | Freq: Once | ORAL | Status: AC | PRN
  Administered 2022-12-25: 5 mg via ORAL
  Filled 2022-12-25: qty 1

## 2022-12-25 NOTE — Progress Notes (Signed)
[facility] PROGRESS NOTE   SAVONA DORCH VWU:[id number] DOB: 12-11-1924 DOA: 12/23/2022  PCP: Frederica Kuster, MD  Brief History/Interval Summary: 89 year old female medical history of recurrent UTI, hypertension, hyperlipidemia, CKD stage 3A, left hip replacement 12/17/2021, cognitive impairment, right-sided eye blindness, hiatal hernia, chronic abdominal and back pain who initially presented to emergency department at [facility] for evaluation of urinary tract infection as well as recent fall.  CT pelvis wo contrast showed-cute nondisplaced fracture involving the lateral aspect of the right pubic ramus and acute fracture of the left pubic bone with associated fracture of the left inferior pubic ramus.   Consultants: Orthopedics  Procedures: None    Subjective/Interval History: Patient denies any significant pain in her pelvic area or in her right chest.  Denies any chest pain shortness of breath.  She would like to be discharged as soon as possible.  Denies any nausea vomiting.    Assessment/Plan:  Bilateral pubic rami fracture CT of the pelvis showed bilateral pubic rami fracture thought to be worse than the right. Seen by orthopedics.  Nonsurgical management.  PT and OT.  Weight-bear as tolerated. Pain control. Seen by physical therapy.  SNF is recommended for short-term rehab.  Sounds like patient lives either in independent living facility or an assisted living facility.  Right-sided rib fracture Seems to have only minimal symptoms.  Incentive spirometry.  Urinary tract infection Continue with ceftriaxone.  Urine culture without any growth.  Essential hypertension Occasional elevated readings noted.  Currently not on any antihypertensives.  Home medication list reviewed.  No antihypertensives noted there either.  Continue to monitor for now.  Acute on chronic kidney disease stage IIIa. Came in with creatinine of 1.29.  Improved to 0.95 today.  Encourage oral  intake.  Avoid nephrotoxic agents.  Monitor urine output.  Normocytic anemia Stable.  No evidence of overt bleeding.  History of depression Continue home medications.   DVT Prophylaxis: Lovenox Code Status: Full code Family Communication: Discussed with patient Disposition Plan: SNF    Medications: Scheduled:  acidophilus  1 capsule Oral Daily   aspirin EC  81 mg Oral Daily   cholecalciferol  1,000 Units Oral Daily   docusate sodium  100 mg Oral QODAY   enoxaparin (LOVENOX) injection  30 mg Subcutaneous Q24H   FLUoxetine  20 mg Oral Daily   folic acid  1 mg Oral Daily   multivitamin with minerals  1 tablet Oral Daily   pantoprazole  40 mg Oral QAC breakfast   [START ON 12/27/2022] polyethylene glycol  17 g Oral Once per day on Mon Thu   sodium chloride flush  3 mL Intravenous Q12H   Continuous:  sodium chloride 10 mL/hr at 12/23/22 1633   cefTRIAXone (ROCEPHIN)  IV 1 g (12/25/22 1012)   GNF:AOZHYQ chloride, acetaminophen **OR** acetaminophen, hydrALAZINE, lip balm, ondansetron **OR** ondansetron (ZOFRAN) IV, oxyCODONE-acetaminophen, sodium chloride flush  Antibiotics: Anti-infectives (From admission, onward)    Start     Dose/Rate Route Frequency Ordered Stop   12/24/22 1000  cefTRIAXone (ROCEPHIN) 1 g in sodium chloride 0.9 % 100 mL IVPB        1 g 200 mL/hr over 30 Minutes Intravenous Every 24 hours 12/23/22 2147 12/28/22 0959   12/23/22 2230  cephALEXin (KEFLEX) capsule 500 mg  Status:  Discontinued        500 mg Oral Every 12 hours 12/23/22 2130 12/23/22 2147   12/23/22 1530  cefTRIAXone (ROCEPHIN) 1 g in sodium chloride 0.9 % 100  mL IVPB        1 g 200 mL/hr over 30 Minutes Intravenous  Once 12/23/22 1521 12/23/22 1704       Objective:  Vital Signs  Vitals:   12/24/22 1932 12/25/22 0452 12/25/22 0515 12/25/22 0830  BP: (!) 149/59 (!) 161/63  (!) 147/86  Pulse: 71 72  71  Resp: 18 20  15   Temp: 98.3 F (36.8 C) 98.1 F (36.7 C)  97.9 F (36.6 C)   TempSrc:    Oral  SpO2: 98% 93%  95%  Weight:   52.3 kg     Intake/Output Summary (Last 24 hours) at 12/25/2022 1110 Last data filed at 12/25/2022 0515 Gross per 24 hour  Intake 100.18 ml  Output 1100 ml  Net -999.82 ml   Filed Weights   12/24/22 0500 12/25/22 0515  Weight: 49.1 kg 52.3 kg    General appearance: Awake alert.  In no distress Resp: Clear to auscultation bilaterally.  Normal effort No bruising noted over chest wall Cardio: S1-S2 is normal regular.  No S3-S4.  No rubs murmurs or bruit GI: Abdomen is soft.  Nontender nondistended.  Bowel sounds are present normal.  No masses organomegaly Extremities: No edema.  Was able to lift both legs off the bed but limited range of motion noted. Neurologic: Alert and oriented x3.  No focal neurological deficits.    Lab Results:  Data Reviewed: I have personally reviewed following labs and reports of the imaging studies  CBC: Recent Labs  Lab 12/23/22 1459 12/24/22 0323 12/25/22 0135  WBC 13.3* 11.4* 5.5  NEUTROABS  --   --  3.5  HGB 10.6* 10.0* 10.1*  HCT 32.4* 31.0* 31.8*  MCV 95.6 98.4 95.8  PLT 234 234 211    Basic Metabolic Panel: Recent Labs  Lab 12/23/22 1459 12/24/22 0323 12/25/22 0135  NA 136 135 136  K 4.1 3.9 3.9  CL 108 109 110  CO2 18* 17* 19*  GLUCOSE 109* 105* 99  BUN 32* 27* 21  CREATININE 1.29* 1.15* 0.95  CALCIUM 9.6 8.7* 8.7*    GFR: CrCl cannot be calculated (Unknown ideal weight.).  Liver Function Tests: Recent Labs  Lab 12/23/22 1459 12/24/22 0323  AST 23 26  ALT 10 15  ALKPHOS 198* 187*  BILITOT 0.6 0.8  PROT 6.7 6.3*  ALBUMIN 3.8 3.1*     Recent Results (from the past 240 hour(s))  Urine Culture     Status: None   Collection Time: 12/24/22  5:30 AM   Specimen: Urine, Random  Result Value Ref Range Status   Specimen Description URINE, RANDOM  Final   Special Requests NONE Reflexed from [id number]  Final   Culture   Final    NO GROWTH Performed at [facility]  Lab, [address], [place], [place] [address]    Report Status 12/25/2022 FINAL  Final      Radiology Studies: DG Ribs Unilateral W/Chest Right  Result Date: 12/23/2022 CLINICAL DATA:  Status post fall Thursday.  Right rib pain. EXAM: RIGHT RIBS AND CHEST - 3+ VIEW COMPARISON:  None Available. FINDINGS: Bilateral chronic interstitial thickening. No focal consolidation. No pleural effusion or pneumothorax. Heart and mediastinal contours are unremarkable. Large hiatal hernia. Subacute-chronic multiple right posterior rib fractures involving the right posterior third, fourth, fifth, sixth, seventh and eighth ribs. Old left posterior fifth through ninth ribs. IMPRESSION: 1. Subacute-chronic multiple right posterior rib fractures involving the right posterior third, fourth, fifth, sixth, seventh and eighth  ribs. Electronically Signed   By: Elige Ko M.D.   On: 12/23/2022 16:41   DG Hip Unilat W or Wo Pelvis 2-3 Views Right  Result Date: 12/23/2022 CLINICAL DATA:  Fall.  Pain. EXAM: DG HIP (WITH OR WITHOUT PELVIS) 2-3V RIGHT COMPARISON:  12/31/2021 FINDINGS: Acute fractures of the right superior inferior pubic rami evident. Acute left pubic bone fracture noted. Advanced degenerative changes in the left hip without evidence for left femoral neck fracture. IMPRESSION: 1. Acute fractures of the right superior and inferior pubic rami. 2. Acute left pubic bone fracture. Electronically Signed   By: Kennith Center M.D.   On: 12/23/2022 16:41   CT PELVIS WO CONTRAST  Result Date: 12/23/2022 CLINICAL DATA:  Follow-up bilateral hip pain. EXAM: CT PELVIS WITHOUT CONTRAST TECHNIQUE: Multidetector CT imaging of the pelvis was performed following the standard protocol without intravenous contrast. RADIATION DOSE REDUCTION: This exam was performed according to the departmental dose-optimization program which includes automated exposure control, adjustment of the mA and/or kV according to patient size and/or use of iterative  reconstruction technique. COMPARISON:  CT stone study 04/02/2022 FINDINGS: Urinary Tract:  Unremarkable. Bowel:  Choose 1 Vascular/Lymphatic: Atherosclerotic calcification distal aorta and common iliac arteries. No pelvic sidewall lymphadenopathy. Reproductive:  There is no adnexal mass. Other:  No intraperitoneal free fluid. Musculoskeletal: Status post left hip hemiarthroplasty. Probable insufficiency fracture of the right sacral ala. Nondisplaced acute appearing fracture identified in the left sacrum (see coronal 45/6 and axial 33/3). There is an acute nondisplaced fracture involving the lateral aspect of the right superior pubic ramus near the acetabulum with acute fracture involving the mid aspect of the right inferior pubic ramus. Acute fracture noted left pubic bone (axial 101/3) with an associated fracture of the left inferior pubic ramus, age indeterminate. Advanced degenerative changes noted in the right hip without evidence for right femoral neck fracture. The entire femoral component of the left hip replacement has not been included on the study. IMPRESSION: 1. Acute nondisplaced fracture involving the lateral aspect of the right superior pubic ramus near the acetabulum with acute fracture involving the mid aspect of the right inferior pubic ramus. 2. Acute fracture of the left pubic bone with an associated fracture of the left inferior pubic ramus, age indeterminate. 3. Nondisplaced acute appearing fracture of the left sacrum. 4. Fracture of the right sacral ala is likely a chronic insufficiency fracture. 5. Advanced degenerative changes in the right hip without evidence for right femoral neck fracture. 6.  Aortic Atherosclerosis (ICD10-I70.0). Electronically Signed   By: Kennith Center M.D.   On: 12/23/2022 16:39   DG Hip Unilat W or Wo Pelvis 2-3 Views Left  Result Date: 12/23/2022 CLINICAL DATA:  Status post fall.  Hip pain. EXAM: DG HIP (WITH OR WITHOUT PELVIS) 2-3V LEFT COMPARISON:  01/29/2022  FINDINGS: Bones are diffusely demineralized. SI joints unremarkable. There is an acute fracture of the right superior pubic ramus near the acetabulum with cortical disruption in the inferior right pubic ramus. These findings are new since prior. Possible acute fracture involving the left pubic bone. Degenerative changes noted right hip. Patient is status post left hip hemiarthroplasty without complicating features. IMPRESSION: 1. Acute fractures of the right superior and inferior pubic rami. 2. Possible acute fracture involving the left pubic bone. Cortical irregularity in the inferior left pubic pubic ramus also raises the question of fracture. CT imaging could be used to further evaluate as clinically warranted. 3. Status post left hip hemiarthroplasty without complicating features. Electronically  Signed   By: Kennith Center M.D.   On: 12/23/2022 16:32   CT Head Wo Contrast  Result Date: 12/23/2022 CLINICAL DATA:  Head and neck trauma EXAM: CT HEAD WITHOUT CONTRAST CT CERVICAL SPINE WITHOUT CONTRAST TECHNIQUE: Multidetector CT imaging of the head and cervical spine was performed following the standard protocol without intravenous contrast. Multiplanar CT image reconstructions of the cervical spine were also generated. RADIATION DOSE REDUCTION: This exam was performed according to the departmental dose-optimization program which includes automated exposure control, adjustment of the mA and/or kV according to patient size and/or use of iterative reconstruction technique. COMPARISON:  04/02/2022 FINDINGS: CT HEAD FINDINGS Brain: No evidence of acute infarction, hemorrhage, hydrocephalus, extra-axial collection or mass lesion/mass effect. Periventricular white matter hypodensity. Lacunar infarct of the right caudate, unchanged. Vascular: No hyperdense vessel or unexpected calcification. Skull: Normal. Negative for fracture or focal lesion. Sinuses/Orbits: No acute finding. Other: None. CT CERVICAL SPINE FINDINGS  Alignment: Normal. Skull base and vertebrae: No acute fracture. No primary bone lesion or focal pathologic process. Soft tissues and spinal canal: No prevertebral fluid or swelling. No visible canal hematoma. Disc levels: Mild multilevel disc space height loss and osteophytosis. Upper chest: Negative. Other: None. IMPRESSION: 1. No acute intracranial pathology. 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 2. No fracture or static subluxation of the cervical spine. 3. Mild multilevel cervical disc degenerative disease. Electronically Signed   By: Jearld Lesch M.D.   On: 12/23/2022 16:25   CT Cervical Spine Wo Contrast  Result Date: 12/23/2022 CLINICAL DATA:  Head and neck trauma EXAM: CT HEAD WITHOUT CONTRAST CT CERVICAL SPINE WITHOUT CONTRAST TECHNIQUE: Multidetector CT imaging of the head and cervical spine was performed following the standard protocol without intravenous contrast. Multiplanar CT image reconstructions of the cervical spine were also generated. RADIATION DOSE REDUCTION: This exam was performed according to the departmental dose-optimization program which includes automated exposure control, adjustment of the mA and/or kV according to patient size and/or use of iterative reconstruction technique. COMPARISON:  04/02/2022 FINDINGS: CT HEAD FINDINGS Brain: No evidence of acute infarction, hemorrhage, hydrocephalus, extra-axial collection or mass lesion/mass effect. Periventricular white matter hypodensity. Lacunar infarct of the right caudate, unchanged. Vascular: No hyperdense vessel or unexpected calcification. Skull: Normal. Negative for fracture or focal lesion. Sinuses/Orbits: No acute finding. Other: None. CT CERVICAL SPINE FINDINGS Alignment: Normal. Skull base and vertebrae: No acute fracture. No primary bone lesion or focal pathologic process. Soft tissues and spinal canal: No prevertebral fluid or swelling. No visible canal hematoma. Disc  levels: Mild multilevel disc space height loss and osteophytosis. Upper chest: Negative. Other: None. IMPRESSION: 1. No acute intracranial pathology. 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 2. No fracture or static subluxation of the cervical spine. 3. Mild multilevel cervical disc degenerative disease. Electronically Signed   By: Jearld Lesch M.D.   On: 12/23/2022 16:25       LOS: 2 days   Akshar Starnes Rito Ehrlich  [facility] Pager on [email or web address]  12/25/2022, 11:10 AM

## 2022-12-25 NOTE — TOC CAGE-AID Note (Signed)
Transition of Care [facility]) - CAGE-AID Screening   Patient Details  Name: [name] MRN: [id number] Date of Birth: 1924/08/25  [facility] of [facility]) CM/SW Contact:    Leota Sauers, RN Phone Number: 12/25/2022, 5:58 AM   Clinical Narrative:  Patient denies use of alcohol and illicit drugs, education not offered at this time.  CAGE-AID Screening:    Have You Ever Felt You Ought to Cut Down on Your Drinking or Drug Use?: No Have People Annoyed You By Critizing Your Drinking Or Drug Use?: No Have You Felt Bad Or Guilty About Your Drinking Or Drug Use?: No Have You Ever Had a Drink or Used Drugs First Thing In The Morning to Steady Your Nerves or to Get Rid of a Hangover?: No CAGE-AID Score: 0  Substance Abuse Education Offered: No

## 2022-12-26 DIAGNOSIS — S3282XD Multiple fractures of pelvis without disruption of pelvic ring, subsequent encounter for fracture with routine healing: Secondary | ICD-10-CM | POA: Diagnosis not present

## 2022-12-26 DIAGNOSIS — D649 Anemia, unspecified: Secondary | ICD-10-CM | POA: Diagnosis not present

## 2022-12-26 DIAGNOSIS — N390 Urinary tract infection, site not specified: Secondary | ICD-10-CM | POA: Diagnosis not present

## 2022-12-26 MED ORDER — SENNOSIDES-DOCUSATE SODIUM 8.6-50 MG PO TABS
2.0000 | ORAL_TABLET | Freq: Two times a day (BID) | ORAL | Status: DC
  Administered 2022-12-26 – 2022-12-28 (×5): 2 via ORAL
  Filled 2022-12-26 (×5): qty 2

## 2022-12-26 MED ORDER — POLYETHYLENE GLYCOL 3350 17 G PO PACK
17.0000 g | PACK | Freq: Two times a day (BID) | ORAL | Status: DC
  Administered 2022-12-26 – 2022-12-28 (×5): 17 g via ORAL
  Filled 2022-12-26 (×5): qty 1

## 2022-12-26 MED ORDER — MELATONIN 5 MG PO TABS
5.0000 mg | ORAL_TABLET | Freq: Every evening | ORAL | Status: DC | PRN
  Administered 2022-12-26: 5 mg via ORAL
  Filled 2022-12-26 (×2): qty 1

## 2022-12-26 NOTE — NC FL2 (Signed)
MEDICAID FL2 LEVEL OF CARE FORM     IDENTIFICATION  Patient Name: Evelyn [name] Birthdate: 11-17-1924 Sex: female Admission Date (Current Location): 12/23/2022  [facility] and [place] Number:  Producer, television/film/video and Address:  [facility], [address] [place], [place] [address]      Provider Number: [id number]  Attending Physician Name and Address:  Osvaldo Shipper, MD  Relative Name and Phone Number:       Current Level of Care: Hospital Recommended Level of Care: Skilled Nursing Facility Prior Approval Number:    Date Approved/Denied:   PASRR Number: [id number]  Discharge Plan: SNF    Current Diagnoses: Patient Active Problem List   Diagnosis Date Noted   Closed fracture of ramus of right pubis (HCC) 12/24/2022   History of bilateral hip arthroplasty 12/23/2022   Lower lobe pneumonia 04/03/2022   Debility 04/03/2022   Pyuria 12/18/2021   Fever 12/18/2021   Macrocytic anemia 12/18/2021   Malnutrition of moderate degree 12/18/2021   Fracture of femoral neck, left (HCC) 12/17/2021   Bilateral acute nondisplaced hip fracture (HCC) 12/17/2021   Delirium 11/09/2021   Essential hypertension 11/08/2021   Hypokalemia 11/08/2021   Compression fracture of body of thoracic vertebra (HCC) 11/06/2021   Acute anemia 11/06/2021   Cognitive impairment 11/06/2021   Lactic acidosis 11/06/2021   Black stools    Heme + stool    Peptic ulcer disease    Acute metabolic encephalopathy 11/05/2021   Closed wedge compression fracture of T12 vertebra (HCC) 11/24/2016   Chronic midline low back pain 11/18/2016   Fatigue 11/18/2016   CKD stage 3a, GFR 45-59 ml/min (HCC) 11/18/2016   IBS (irritable bowel syndrome)    UTI (urinary tract infection)    AKI (acute kidney injury) (HCC) 01/30/2015   Routine general medical examination at a health care facility 05/03/2013   Chronic idiopathic constipation 03/31/2012   Other screening mammogram  03/24/2011   Depression 03/06/2010   Insomnia 03/08/2008   HLD (hyperlipidemia) 02/21/2007    Orientation RESPIRATION BLADDER Height & Weight     Self, Time, Situation, Place  Normal Continent, External catheter Weight: 108 lb 1.6 oz (49 kg) Height:     BEHAVIORAL SYMPTOMS/MOOD NEUROLOGICAL BOWEL NUTRITION STATUS      Continent Diet (See DC summary)  AMBULATORY STATUS COMMUNICATION OF NEEDS Skin   Extensive Assist Verbally Normal                       Personal Care Assistance Level of Assistance  Bathing, Feeding, Dressing Bathing Assistance: Limited assistance Feeding assistance: Independent Dressing Assistance: Limited assistance     Functional Limitations Info  Sight, Speech, Hearing Sight Info: Adequate Hearing Info: Adequate Speech Info: Adequate    SPECIAL CARE FACTORS FREQUENCY  OT (By licensed OT), PT (By licensed PT)     PT Frequency: 5x a week OT Frequency: 5x a week            Contractures Contractures Info: Not present    Additional Factors Info  Code Status, Allergies Code Status Info: Full Allergies Info: NKA           Current Medications (12/26/2022):  This is the current hospital active medication list Current Facility-Administered Medications  Medication Dose Route Frequency Provider Last Rate Last Admin   0.9 %  sodium chloride infusion   Intravenous PRN Tereasa Coop, MD 10 mL/hr at 12/23/22 1633 New Bag at 12/23/22 1633  acetaminophen (TYLENOL) tablet 650 mg  650 mg Oral Q6H PRN Janalyn Shy, Subrina, MD       Or   acetaminophen (TYLENOL) suppository 650 mg  650 mg Rectal Q6H PRN Janalyn Shy, Subrina, MD       acidophilus (RISAQUAD) capsule 1 capsule  1 capsule Oral Daily Sundil, Subrina, MD   1 capsule at 12/26/22 1610   aspirin EC tablet 81 mg  81 mg Oral Daily Sundil, Subrina, MD   81 mg at 12/26/22 0921   cefTRIAXone (ROCEPHIN) 1 g in sodium chloride 0.9 % 100 mL IVPB  1 g Intravenous Q24H Sundil, Subrina, MD 200 mL/hr at 12/26/22 0925 1  g at 12/26/22 0925   cholecalciferol (VITAMIN D3) 25 MCG (1000 UNIT) tablet 1,000 Units  1,000 Units Oral Daily Janalyn Shy, Subrina, MD   1,000 Units at 12/26/22 0921   enoxaparin (LOVENOX) injection 30 mg  30 mg Subcutaneous Q24H Sundil, Subrina, MD   30 mg at 12/26/22 9604   FLUoxetine (PROZAC) capsule 20 mg  20 mg Oral Daily Sundil, Subrina, MD   20 mg at 12/26/22 5409   folic acid (FOLVITE) tablet 1 mg  1 mg Oral Daily Sundil, Subrina, MD   1 mg at 12/26/22 8119   hydrALAZINE (APRESOLINE) tablet 25 mg  25 mg Oral Q8H PRN Kathlen Mody, MD       lip balm (CARMEX) ointment   Topical PRN Jonah Blue, MD       multivitamin with minerals tablet 1 tablet  1 tablet Oral Daily Janalyn Shy, Subrina, MD   1 tablet at 12/26/22 0921   ondansetron (ZOFRAN) tablet 4 mg  4 mg Oral Q6H PRN Janalyn Shy, Subrina, MD       Or   ondansetron [facility]) injection 4 mg  4 mg Intravenous Q6H PRN Janalyn Shy, Subrina, MD       oxyCODONE-acetaminophen (PERCOCET/ROXICET) 5-325 MG per tablet 1-2 tablet  1-2 tablet Oral Q6H PRN Janalyn Shy, Subrina, MD   1 tablet at 12/26/22 0758   pantoprazole (PROTONIX) EC tablet 40 mg  40 mg Oral QAC breakfast Sundil, Subrina, MD   40 mg at 12/26/22 0758   polyethylene glycol (MIRALAX / GLYCOLAX) packet 17 g  17 g Oral BID Osvaldo Shipper, MD   17 g at 12/26/22 1008   senna-docusate (Senokot-S) tablet 2 tablet  2 tablet Oral BID Osvaldo Shipper, MD   2 tablet at 12/26/22 1008   sodium chloride flush (NS) 0.9 % injection 3 mL  3 mL Intravenous Q12H Sundil, Subrina, MD   3 mL at 12/26/22 1478   sodium chloride flush (NS) 0.9 % injection 3 mL  3 mL Intravenous PRN Janalyn Shy, Subrina, MD   3 mL at 12/25/22 1004     Discharge Medications: Please see discharge summary for a list of discharge medications.  Relevant Imaging Results:  Relevant Lab Results:   Additional Information SSN: [id number]  Jimmy [place], [place]

## 2022-12-26 NOTE — TOC Initial Note (Signed)
Transition of Care [facility]) - Initial/Assessment Note    Patient Details  Name: Evelyn [name] MRN: [id number] Date of Birth: 1925-06-07  [name]) CM/SW Contact:    Jimmy Picket, LCSW Phone Number: 12/26/2022, 12:53 PM  Clinical Narrative:                  CSW received SNF consult. CSW met with pt at bedside. CSW introduced self and explained role at the hospital. Pt reports that PTA she lived in ALF at  [facility]. Patient reports she uses a rolling walker independently. Patient reports staff assists with bathing but member is able to complete all other ADL's.   CSW reviewed PT/OT recommendations for SNF. Pt reports she is fine with going to SNF. Pt gave CSW permission to fax out to facilities in the area. Pt reports she went to [facility] year and would prefer to go back. CSW gave pt medicare.gov rating list to review. CSW explained insurance auth process.  CSW will continue to follow.   Expected Discharge Plan: Skilled Nursing Facility Barriers to Discharge: Continued Medical Work up   Patient Goals and CMS Choice Patient states their goals for this hospitalization and ongoing recovery are:: rehab then return to ALF CMS Medicare.gov Compare Post Acute Care list provided to:: Patient Choice offered to / list presented to : Patient      Expected Discharge Plan and Services       Living arrangements for the past 2 months: Assisted Living Facility                                      Prior Living Arrangements/Services Living arrangements for the past 2 months: Assisted Living Facility Lives with:: Facility Resident Patient language and need for interpreter reviewed:: Yes Do you feel safe going back to the place where you live?: Yes      Need for Family Participation in Patient Care: Yes (Comment) Care giver support system in place?: Yes (comment)   Criminal Activity/Legal Involvement Pertinent to Current Situation/Hospitalization: No -  Comment as needed  Activities of Daily Living Home Assistive Devices/Equipment: None ADL Screening (condition at time of admission) Patient's cognitive ability adequate to safely complete daily activities?: No Is the patient deaf or have difficulty hearing?: No Does the patient have difficulty seeing, even when wearing glasses/contacts?: No Does the patient have difficulty concentrating, remembering, or making decisions?: No Patient able to express need for assistance with ADLs?: Yes Does the patient have difficulty dressing or bathing?: Yes Independently performs ADLs?: No Communication: Independent Dressing (OT): Dependent Grooming: Needs assistance Is this a change from baseline?: Change from baseline, expected to last >3 days Feeding: Independent Bathing: Dependent Is this a change from baseline?: Change from baseline, expected to last >3 days Toileting: Needs assistance Is this a change from baseline?: Change from baseline, expected to last >3days In/Out Bed: Dependent Is this a change from baseline?: Change from baseline, expected to last >3 days Walks in Home: Independent Does the patient have difficulty walking or climbing stairs?: Yes Weakness of Legs: Both Weakness of Arms/Hands: Both  Permission Sought/Granted Permission sought to share information with : Facility Industrial/product designer granted to share information with : Yes, Verbal Permission Granted     Permission granted to share info w AGENCY: SNF        Emotional Assessment Appearance:: Appears stated age Attitude/Demeanor/Rapport: Engaged Affect (  typically observed): Stable Orientation: : Oriented to Self, Oriented to Place, Oriented to  Time, Oriented to Situation   Psych Involvement: No (comment)  Admission diagnosis:  Multiple pelvic fractures (HCC) [S32.82XA] Closed fracture of ramus of right pubis, initial encounter (HCC) [S32.591A] Urinary tract infection without hematuria, site  unspecified [N39.0] Closed fracture of left pubis, unspecified portion of pubis, initial encounter (HCC) [S32.502A] Closed fracture of sacrum, unspecified portion of sacrum, initial encounter (HCC) [S32.10XA] Patient Active Problem List   Diagnosis Date Noted   Closed fracture of ramus of right pubis (HCC) 12/24/2022   History of bilateral hip arthroplasty 12/23/2022   Lower lobe pneumonia 04/03/2022   Debility 04/03/2022   Pyuria 12/18/2021   Fever 12/18/2021   Macrocytic anemia 12/18/2021   Malnutrition of moderate degree 12/18/2021   Fracture of femoral neck, left (HCC) 12/17/2021   Bilateral acute nondisplaced hip fracture (HCC) 12/17/2021   Delirium 11/09/2021   Essential hypertension 11/08/2021   Hypokalemia 11/08/2021   Compression fracture of body of thoracic vertebra (HCC) 11/06/2021   Acute anemia 11/06/2021   Cognitive impairment 11/06/2021   Lactic acidosis 11/06/2021   Black stools    Heme + stool    Peptic ulcer disease    Acute metabolic encephalopathy 11/05/2021   Closed wedge compression fracture of T12 vertebra (HCC) 11/24/2016   Chronic midline low back pain 11/18/2016   Fatigue 11/18/2016   CKD stage 3a, GFR 45-59 ml/min (HCC) 11/18/2016   IBS (irritable bowel syndrome)    UTI (urinary tract infection)    AKI (acute kidney injury) (HCC) 01/30/2015   Routine general medical examination at a health care facility 05/03/2013   Chronic idiopathic constipation 03/31/2012   Other screening mammogram 03/24/2011   Depression 03/06/2010   Insomnia 03/08/2008   HLD (hyperlipidemia) 02/21/2007   PCP:  Frederica Kuster, MD Pharmacy:  No Pharmacies Listed    Social Determinants of Health (SDOH) Social History: SDOH Screenings   Food Insecurity: No Food Insecurity (12/24/2022)  Housing: Low Risk  (12/24/2022)  Transportation Needs: No Transportation Needs (12/24/2022)  Utilities: Not At Risk (12/24/2022)  Alcohol Screen: Low Risk  (09/09/2020)  Depression (PHQ2-9):  Low Risk  (09/09/2020)  Financial Resource Strain: Low Risk  (09/09/2020)  Physical Activity: Sufficiently Active (09/09/2020)  Social Connections: Moderately Integrated (09/09/2020)  Stress: No Stress Concern Present (09/09/2020)  Tobacco Use: Low Risk  (12/23/2022)   SDOH Interventions:     Readmission Risk Interventions    04/04/2022    1:26 PM  Readmission Risk Prevention Plan  Transportation Screening Complete  PCP or Specialist Appt within 5-7 Days Complete  Home Care Screening Complete  Medication Review (RN CM) Complete    Jimmy Picket, LCSW Clinical Social Worker

## 2022-12-26 NOTE — Progress Notes (Signed)
[facility] PROGRESS NOTE   Evelyn [name] ZOX:[id number] DOB: 06/17/1925 DOA: 12/23/2022  PCP: Frederica Kuster, MD  Brief History/Interval Summary: 89 year old female medical history of recurrent UTI, hypertension, hyperlipidemia, CKD stage 3A, left hip replacement 12/17/2021, cognitive impairment, right-sided eye blindness, hiatal hernia, chronic abdominal and back pain who initially presented to emergency department at [facility] for evaluation of urinary tract infection as well as recent fall.  CT pelvis wo contrast showed-cute nondisplaced fracture involving the lateral aspect of the right pubic ramus and acute fracture of the left pubic bone with associated fracture of the left inferior pubic ramus.   Consultants: Orthopedics  Procedures: None    Subjective/Interval History: She mentions that her pain is improving.  Was able to get out of bed yesterday.  No nausea vomiting.  No shortness of breath.     Assessment/Plan:  Bilateral pubic rami fracture CT of the pelvis showed bilateral pubic rami fracture thought to be worse than the right. Seen by orthopedics.  Nonsurgical management.  PT and OT.  Weight-bear as tolerated. Pain control. Seen by physical therapy.  SNF is recommended for short-term rehab.  Sounds like patient lives either in independent living facility or an assisted living facility. Stable as of this morning.  Right-sided rib fracture Seems to have only minimal symptoms.  Incentive spirometry.  Urinary tract infection Continue with ceftriaxone.  Urine culture without any growth.  Will give a 5-day course.  He appears to be improving.  Essential hypertension Occasional elevated readings noted.  Currently not on any antihypertensives.  Home medication list reviewed.  No antihypertensives noted there either.  Pain is likely contributing to elevated blood pressure readings.  Acute on chronic kidney disease stage IIIa. Came in with creatinine of 1.29.   Resolved.  Back to baseline.  Avoid nephrotoxic agents.  Monitor urine output.  Normocytic anemia Stable.  No evidence of overt bleeding.  History of depression Continue home medications.   DVT Prophylaxis: Lovenox Code Status: Full code Family Communication: Discussed with patient Disposition Plan: SNF    Medications: Scheduled:  acidophilus  1 capsule Oral Daily   aspirin EC  81 mg Oral Daily   cholecalciferol  1,000 Units Oral Daily   docusate sodium  100 mg Oral QODAY   enoxaparin (LOVENOX) injection  30 mg Subcutaneous Q24H   FLUoxetine  20 mg Oral Daily   folic acid  1 mg Oral Daily   multivitamin with minerals  1 tablet Oral Daily   pantoprazole  40 mg Oral QAC breakfast   [START ON 12/27/2022] polyethylene glycol  17 g Oral Once per day on Mon Thu   sodium chloride flush  3 mL Intravenous Q12H   Continuous:  sodium chloride 10 mL/hr at 12/23/22 1633   cefTRIAXone (ROCEPHIN)  IV 1 g (12/26/22 0925)   WJX:BJYNWG chloride, acetaminophen **OR** acetaminophen, hydrALAZINE, lip balm, ondansetron **OR** ondansetron (ZOFRAN) IV, oxyCODONE-acetaminophen, sodium chloride flush  Antibiotics: Anti-infectives (From admission, onward)    Start     Dose/Rate Route Frequency Ordered Stop   12/24/22 1000  cefTRIAXone (ROCEPHIN) 1 g in sodium chloride 0.9 % 100 mL IVPB        1 g 200 mL/hr over 30 Minutes Intravenous Every 24 hours 12/23/22 2147 12/28/22 0959   12/23/22 2230  cephALEXin (KEFLEX) capsule 500 mg  Status:  Discontinued        500 mg Oral Every 12 hours 12/23/22 2130 12/23/22 2147   12/23/22 1530  cefTRIAXone (ROCEPHIN) 1 g  in sodium chloride 0.9 % 100 mL IVPB        1 g 200 mL/hr over 30 Minutes Intravenous  Once 12/23/22 1521 12/23/22 1704       Objective:  Vital Signs  Vitals:   12/25/22 1932 12/26/22 0455 12/26/22 0500 12/26/22 0728  BP: 116/74 (!) 142/81  (!) 143/71  Pulse: 79 76  97  Resp: 18 18    Temp: 99 F (37.2 C)  99.4 F (37.4 C) 98.1 F  (36.7 C)  TempSrc: Oral   Oral  SpO2: 96% 95%  96%  Weight:   49 kg     Intake/Output Summary (Last 24 hours) at 12/26/2022 0948 Last data filed at 12/26/2022 0826 Gross per 24 hour  Intake 120 ml  Output 1550 ml  Net -1430 ml    Filed Weights   12/24/22 0500 12/25/22 0515 12/26/22 0500  Weight: 49.1 kg 52.3 kg 49 kg    General appearance: Awake alert.  In no distress Resp: Clear to auscultation bilaterally.  Normal effort Cardio: S1-S2 is normal regular.  No S3-S4.  No rubs murmurs or bruit GI: Abdomen is soft.  Nontender nondistended.  Bowel sounds are present normal.  No masses organomegaly Extremities: No edema.  Range of motion of both lower extremities secondary to pain. No obvious focal neurological deficits.   Lab Results:  Data Reviewed: I have personally reviewed following labs and reports of the imaging studies  CBC: Recent Labs  Lab 12/23/22 1459 12/24/22 0323 12/25/22 0135  WBC 13.3* 11.4* 5.5  NEUTROABS  --   --  3.5  HGB 10.6* 10.0* 10.1*  HCT 32.4* 31.0* 31.8*  MCV 95.6 98.4 95.8  PLT 234 234 211     Basic Metabolic Panel: Recent Labs  Lab 12/23/22 1459 12/24/22 0323 12/25/22 0135  NA 136 135 136  K 4.1 3.9 3.9  CL 108 109 110  CO2 18* 17* 19*  GLUCOSE 109* 105* 99  BUN 32* 27* 21  CREATININE 1.29* 1.15* 0.95  CALCIUM 9.6 8.7* 8.7*     GFR: CrCl cannot be calculated (Unknown ideal weight.).  Liver Function Tests: Recent Labs  Lab 12/23/22 1459 12/24/22 0323  AST 23 26  ALT 10 15  ALKPHOS 198* 187*  BILITOT 0.6 0.8  PROT 6.7 6.3*  ALBUMIN 3.8 3.1*      Recent Results (from the past 240 hour(s))  Urine Culture     Status: None   Collection Time: 12/24/22  5:30 AM   Specimen: Urine, Random  Result Value Ref Range Status   Specimen Description URINE, RANDOM  Final   Special Requests NONE Reflexed from [id number]  Final   Culture   Final    NO GROWTH Performed at [facility], [address] [place], [place]  [address]    Report Status 12/25/2022 FINAL  Final      Radiology Studies: No results found.     LOS: 3 days   Harkirat Orozco [employer] on [email or web address]  12/26/2022, 9:48 AM

## 2022-12-26 NOTE — Plan of Care (Signed)
  Problem: Activity: Goal: Risk for activity intolerance will decrease Outcome: Not Progressing   Problem: Coping: Goal: Level of anxiety will decrease Outcome: Not Progressing   Problem: Pain Managment: Goal: General experience of comfort will improve Outcome: Not Progressing   

## 2022-12-26 NOTE — Progress Notes (Signed)
Patient is very confused at the moment, several attempts to get out of bed, anxious stating she needs to call the police , she is being withheld  and that she needs to be in the church. Reoriented her several times that she is in the hospital because she has fallen patient responds with "I didn't do anything to you". Attempted to call daughter Misty Stanley but call went to voice mail. Chinita Greenland NP informed.

## 2022-12-27 DIAGNOSIS — D649 Anemia, unspecified: Secondary | ICD-10-CM | POA: Diagnosis not present

## 2022-12-27 DIAGNOSIS — S3282XD Multiple fractures of pelvis without disruption of pelvic ring, subsequent encounter for fracture with routine healing: Secondary | ICD-10-CM | POA: Diagnosis not present

## 2022-12-27 DIAGNOSIS — R410 Disorientation, unspecified: Secondary | ICD-10-CM | POA: Diagnosis not present

## 2022-12-27 DIAGNOSIS — N390 Urinary tract infection, site not specified: Secondary | ICD-10-CM | POA: Diagnosis not present

## 2022-12-27 MED ORDER — BISACODYL 10 MG RE SUPP: 10.0000 mg | Freq: Every day | RECTAL | Status: DC | PRN

## 2022-12-27 MED ORDER — QUETIAPINE FUMARATE 25 MG PO TABS
12.5000 mg | ORAL_TABLET | Freq: Two times a day (BID) | ORAL | Status: DC
  Administered 2022-12-27 – 2022-12-28 (×3): 12.5 mg via ORAL
  Filled 2022-12-27 (×3): qty 1

## 2022-12-27 NOTE — Progress Notes (Signed)
[facility] PROGRESS NOTE   Evelyn [name]:[id number] DOB: 16-Aug-1924 DOA: 12/23/2022  PCP: Frederica Kuster, MD  Brief History/Interval Summary: 89 year old female medical history of recurrent UTI, hypertension, hyperlipidemia, CKD stage 3A, left hip replacement 12/17/2021, cognitive impairment, right-sided eye blindness, hiatal hernia, chronic abdominal and back pain who initially presented to emergency department at [facility] for evaluation of urinary tract infection as well as recent fall.  CT pelvis wo contrast showed-cute nondisplaced fracture involving the lateral aspect of the right pubic ramus and acute fracture of the left pubic bone with associated fracture of the left inferior pubic ramus.   Consultants: Orthopedics  Procedures: None    Subjective/Interval History: Overnight events noted.  Patient noted to be agitated and confused this morning.  Seems to be a bit paranoid.   Assessment/Plan:  Bilateral pubic rami fracture CT of the pelvis showed bilateral pubic rami fracture thought to be worse than the right. Seen by orthopedics.  Nonsurgical management.  PT and OT.  Weight-bear as tolerated. Pain control. Seen by physical therapy.  SNF is recommended for short-term rehab.  Sounds like patient lives either in independent living facility or an assisted living facility. Stable as of this morning.  Hospital-acquired delirium Exhibiting some paranoia as well.  Will initiate low-dose Seroquel.  CT head done at the time of admission was unremarkable for any acute intracranial process. Has been urinating.  Has not had a bowel movement.  She is on aggressive bowel regimen.  Right-sided rib fracture Seems to have only minimal symptoms.  Incentive spirometry.  Urinary tract infection Continue with ceftriaxone.  Urine culture without any growth.  Will give a 5-day course.    Essential hypertension Occasional elevated readings noted.  Currently not on any  antihypertensives.  Home medication list reviewed.  No antihypertensives noted there either.  Pain is likely contributing to elevated blood pressure readings.  Acute on chronic kidney disease stage IIIa. Came in with creatinine of 1.29.  Resolved.  Back to baseline.  Avoid nephrotoxic agents.  Monitor urine output.  Normocytic anemia Stable.  No evidence of overt bleeding.  History of depression Continue home medications.   DVT Prophylaxis: Lovenox Code Status: Full code Family Communication: Discussed with patient Disposition Plan: SNF    Medications: Scheduled:  acidophilus  1 capsule Oral Daily   aspirin EC  81 mg Oral Daily   cholecalciferol  1,000 Units Oral Daily   enoxaparin (LOVENOX) injection  30 mg Subcutaneous Q24H   FLUoxetine  20 mg Oral Daily   folic acid  1 mg Oral Daily   multivitamin with minerals  1 tablet Oral Daily   pantoprazole  40 mg Oral QAC breakfast   polyethylene glycol  17 g Oral BID   QUEtiapine  12.5 mg Oral BID   senna-docusate  2 tablet Oral BID   sodium chloride flush  3 mL Intravenous Q12H   Continuous:  sodium chloride 10 mL/hr at 12/23/22 1633   cefTRIAXone (ROCEPHIN)  IV 1 g (12/27/22 1016)   EXB:MWUXLK chloride, acetaminophen **OR** acetaminophen, bisacodyl, hydrALAZINE, lip balm, melatonin, ondansetron **OR** ondansetron (ZOFRAN) IV, oxyCODONE-acetaminophen, sodium chloride flush  Antibiotics: Anti-infectives (From admission, onward)    Start     Dose/Rate Route Frequency Ordered Stop   12/24/22 1000  cefTRIAXone (ROCEPHIN) 1 g in sodium chloride 0.9 % 100 mL IVPB        1 g 200 mL/hr over 30 Minutes Intravenous Every 24 hours 12/23/22 2147 12/28/22 0959   12/23/22 2230  cephALEXin (KEFLEX) capsule 500 mg  Status:  Discontinued        500 mg Oral Every 12 hours 12/23/22 2130 12/23/22 2147   12/23/22 1530  cefTRIAXone (ROCEPHIN) 1 g in sodium chloride 0.9 % 100 mL IVPB        1 g 200 mL/hr over 30 Minutes Intravenous  Once  12/23/22 1521 12/23/22 1704       Objective:  Vital Signs  Vitals:   12/27/22 0414 12/27/22 0434 12/27/22 0530 12/27/22 0736  BP: (!) 182/79  (!) 163/63 130/82  Pulse: 93  97 99  Resp: 16     Temp:  98.3 F (36.8 C)  98 F (36.7 C)  TempSrc:    Oral  SpO2: 94%   99%  Weight:  48.2 kg      Intake/Output Summary (Last 24 hours) at 12/27/2022 1019 Last data filed at 12/27/2022 0435 Gross per 24 hour  Intake 200 ml  Output 900 ml  Net -700 ml    Filed Weights   12/25/22 0515 12/26/22 0500 12/27/22 0434  Weight: 52.3 kg 49 kg 48.2 kg    General appearance: Awake.  Somewhat agitated.  In no distress. Resp: Clear to auscultation bilaterally.  Normal effort Cardio: S1-S2 is normal regular.  No S3-S4.  No rubs murmurs or bruit GI: Abdomen is soft.  Nontender nondistended.  Bowel sounds are present normal.  No masses organomegaly Extremities: No edema.    Lab Results:  Data Reviewed: I have personally reviewed following labs and reports of the imaging studies  CBC: Recent Labs  Lab 12/23/22 1459 12/24/22 0323 12/25/22 0135  WBC 13.3* 11.4* 5.5  NEUTROABS  --   --  3.5  HGB 10.6* 10.0* 10.1*  HCT 32.4* 31.0* 31.8*  MCV 95.6 98.4 95.8  PLT 234 234 211     Basic Metabolic Panel: Recent Labs  Lab 12/23/22 1459 12/24/22 0323 12/25/22 0135  NA 136 135 136  K 4.1 3.9 3.9  CL 108 109 110  CO2 18* 17* 19*  GLUCOSE 109* 105* 99  BUN 32* 27* 21  CREATININE 1.29* 1.15* 0.95  CALCIUM 9.6 8.7* 8.7*     GFR: CrCl cannot be calculated (Unknown ideal weight.).  Liver Function Tests: Recent Labs  Lab 12/23/22 1459 12/24/22 0323  AST 23 26  ALT 10 15  ALKPHOS 198* 187*  BILITOT 0.6 0.8  PROT 6.7 6.3*  ALBUMIN 3.8 3.1*      Recent Results (from the past 240 hour(s))  Urine Culture     Status: None   Collection Time: 12/24/22  5:30 AM   Specimen: Urine, Random  Result Value Ref Range Status   Specimen Description URINE, RANDOM  Final   Special  Requests NONE Reflexed from [id number]  Final   Culture   Final    NO GROWTH Performed at [facility], [address] [place], [place] [address]    Report Status 12/25/2022 FINAL  Final      Radiology Studies: No results found.     LOS: 4 days   Magen Suriano [employer] on [email or web address]  12/27/2022, 10:19 AM

## 2022-12-27 NOTE — Care Management Important Message (Signed)
Important Message  Patient Details  Name: Evelyn [name] MRN: [id number] Date of Birth: 01-05-1925   Medicare Important Message Given:  Yes     Sherilyn Banker 12/27/2022, 3:41 PM

## 2022-12-27 NOTE — Progress Notes (Signed)
PT Cancellation Note  Patient Details Name: Evelyn [name] MRN: [id number] DOB: 1925-02-17   Cancelled Treatment:    Reason Eval/Treat Not Completed: Other (comment) pt reports she is "giving up" and politely refused any attempt at mobility this morning. PT alerted RN and will continue to re-attempt throughout day as time/schedule allow.   Vickki Muff, PT, DPT   Acute Rehabilitation Department Office [phone] Secure Chat Communication Preferred   Ronnie Derby 12/27/2022, 9:46 AM

## 2022-12-27 NOTE — Plan of Care (Signed)
?  Problem: Activity: ?Goal: Risk for activity intolerance will decrease ?Outcome: Not Progressing ?  ?Problem: Coping: ?Goal: Level of anxiety will decrease ?Outcome: Not Progressing ?  ?Problem: Safety: ?Goal: Ability to remain free from injury will improve ?Outcome: Not Progressing ?  ?

## 2022-12-27 NOTE — Progress Notes (Signed)
Mobility Specialist Progress Note:    12/27/22 1138  Mobility  Activity Transferred to/from [facility];Transferred from bed to chair  Level of Assistance Minimal assist, patient does 75% or more  Assistive Device Front wheel walker  Distance Ambulated (ft) 6 ft  Activity Response Tolerated fair  Mobility Referral Yes  $Mobility charge 1 Mobility  Mobility Specialist Start Time (ACUTE ONLY) 1003  Mobility Specialist Stop Time (ACUTE ONLY) 1025  Mobility Specialist Time Calculation (min) (ACUTE ONLY) 22 min   Pt received in bed, hesitant but agreeable to mobilize. Pt requested BSC prior to transferring to chair, BM successful, NT assisted in pericare. Pt was able to take a couple of steps towards the chair. Needed moderate verbal cues for redirection. Pt situated in chair, all needs met. Chair alarm on.   Thompson Grayer Mobility Specialist  Please contact vis Secure Chat or  Rehab Office [phone]

## 2022-12-27 NOTE — Progress Notes (Addendum)
Patient still very disoriented, looking for daughter Misty Stanley, reiterated to patient that per conversation with daughter last night she will come at 1000, patient stated "I know you are lying, the TV says its 1000." Reoriented her that it is only 0555 and that my shift will end at 0730. Will attempt to call daughter to pacify patient.   At 0600, called Misty Stanley, Daughter, as patient kept on stating that people are out to get her. Informed Chinita Greenland of worsening delirium, and difficulty to re-orient patient. Lights are turned on. Patient insists she is not in the hospital and that this nurse is out to get her.

## 2022-12-27 NOTE — TOC Progression Note (Addendum)
Transition of Care [facility]) - Progression Note    Patient Details  Name: Evelyn [name] MRN: [id number] Date of Birth: [birth year]  Transition of [facility]) CM/SW Contact  Erin Sons, [place] Phone Number: 12/27/2022, 10:17 AM  Clinical Narrative:     CSW contacted [facility] regarding SNF referral. Admissions will review referral and notify CSW on decision.   1100: [facility] confirmed they can offer SNF bed.   CSW met with daughter and updated her.   TOC will continue to follow for DC to SNF when medically ready.   Expected Discharge Plan: Skilled Nursing Facility Barriers to Discharge: SNF Pending bed offer  Expected Discharge Plan and Services       Living arrangements for the past 2 months: Assisted Living Facility                                       Social Determinants of Health (SDOH) Interventions SDOH Screenings   Food Insecurity: No Food Insecurity (12/24/2022)  Housing: Low Risk  (12/24/2022)  Transportation Needs: No Transportation Needs (12/24/2022)  Utilities: Not At Risk (12/24/2022)  Alcohol Screen: Low Risk  (09/09/2020)  Depression (PHQ2-9): Low Risk  (09/09/2020)  Financial Resource Strain: Low Risk  (09/09/2020)  Physical Activity: Sufficiently Active (09/09/2020)  Social Connections: Moderately Integrated (09/09/2020)  Stress: No Stress Concern Present (09/09/2020)  Tobacco Use: Low Risk  (12/23/2022)    Readmission Risk Interventions    04/04/2022    1:26 PM  Readmission Risk Prevention Plan  Transportation Screening Complete  PCP or Specialist Appt within 5-7 Days Complete  Home Care Screening Complete  Medication Review (RN CM) Complete

## 2022-12-27 NOTE — Progress Notes (Signed)
Physical Therapy Treatment Patient Details Name: Evelyn [name] MRN: [id number] DOB: [birth year] Today's Date: 12/27/2022   History of Present Illness The pt is a 89 yo female presenting 7/4 with AMS and recent fall. Pt found to have UTI, nondisplaced fracture of R superior pubic ramus and inferior pubic ramus, and nondisplaced fx of L sacrum. PMH includes: recurrent UTI, HTN, HLD, CKD III, L hip replacement 2023, R eye blindness, and chronic back pain.    PT Comments  The pt was agreeable to session at this time, and was able to make great progress with ambulation tolerance and at times was able to stand with minA. The pt continues to report significant pain in her pelvis with standing and walking, is heavily dependent on RW for support in addition to assist from PT. The pt will continue to benefit from skilled PT acutely and at in-patient facility after d/c prior to return to assisted living facility. Pt in agreement with this plan and asking about therapy schedule at next venue.      Assistance Recommended at Discharge Frequent or constant Supervision/Assistance  If plan is discharge home, recommend the following:  Can travel by private vehicle    A lot of help with walking and/or transfers;A lot of help with bathing/dressing/bathroom;Assistance with cooking/housework;Direct supervision/assist for medications management;Assist for transportation;Help with stairs or ramp for entrance   Yes  Equipment Recommendations  None recommended by PT    Recommendations for Other Services       Precautions / Restrictions Precautions Precautions: Fall Precaution Comments: urinary incontinence with standing Restrictions Weight Bearing Restrictions: Yes RLE Weight Bearing: Weight bearing as tolerated LLE Weight Bearing: Weight bearing as tolerated     Mobility  Bed Mobility Overal bed mobility: Needs Assistance Bed Mobility: Sit to Supine       Sit to supine: Min assist   General bed  mobility comments: minA to reposition    Transfers Overall transfer level: Needs assistance Equipment used: Rolling walker (2 wheels) Transfers: Sit to/from Stand Sit to Stand: Mod assist           General transfer comment: modA to power up to standing and at times minA with good UE support. cues for technique each time    Ambulation/Gait Ambulation/Gait assistance: Min guard, Min assist Gait Distance (Feet): 15 Feet (+ 15 ft) Assistive device: Rolling walker (2 wheels) Gait Pattern/deviations: Step-to pattern, Decreased weight shift to right, Shuffle Gait velocity: decreased Gait velocity interpretation: <1.31 ft/sec, indicative of household ambulator   General Gait Details: small steps with minimal clearance and heavy dependence on BUE support. minimal wt shift onto R. max cues for navigating in room, assist to manage RW      Balance Overall balance assessment: Needs assistance, History of Falls Sitting-balance support: Feet supported Sitting balance-Leahy Scale: Fair Sitting balance - Comments: able to reach outside BOS slightly   Standing balance support: During functional activity, Reliant on assistive device for balance Standing balance-Leahy Scale: Poor Standing balance comment: requires RW for support, not able to balance unsupported                            Cognition Arousal/Alertness: Awake/alert Behavior During Therapy: WFL for tasks assessed/performed Overall Cognitive Status: Impaired/Different from baseline Area of Impairment: Orientation, Memory, Safety/judgement, Awareness, Problem solving, Attention, Following commands                 Orientation Level: Disoriented to, Time, Place Current  Attention Level: Focused Memory: Decreased recall of precautions, Decreased short-term memory Following Commands: Follows one step commands inconsistently, Follows one step commands with increased time Safety/Judgement: Decreased awareness of  safety Awareness: Intellectual Problem Solving: Slow processing, Requires verbal cues, Decreased initiation, Difficulty sequencing General Comments: pt with difficulty sequencing tasks, little awareness of her own needs (incontinent of urine and unaware), needing significant assist to manage RW and unable to follow instructions regarding turns and navigating in the room. daughter present and reports continued AMS, no active hallucinations        Exercises      General Comments General comments (skin integrity, edema, etc.): VSS onRA, daughter present and supportive. pt incontinent of urine upon standing      Pertinent Vitals/Pain Pain Assessment Pain Assessment: Faces Faces Pain Scale: Hurts little more Pain Location: pt reprots significant pain with ambulation, no c/o pain at rest Pain Descriptors / Indicators: Aching, Discomfort Pain Intervention(s): Monitored during session, Repositioned, Limited activity within patient's tolerance     PT Goals (current goals can now be found in the care plan section) Acute Rehab PT Goals Patient Stated Goal: retrun to independence PT Goal Formulation: With patient Time For Goal Achievement: 01/07/23 Potential to Achieve Goals: Good Progress towards PT goals: Progressing toward goals    Frequency    Min 3X/week      PT Plan Current plan remains appropriate       AM-PAC PT "6 Clicks" Mobility   Outcome Measure  Help needed turning from your back to your side while in a flat bed without using bedrails?: A Little Help needed moving from lying on your back to sitting on the side of a flat bed without using bedrails?: A Little Help needed moving to and from a bed to a chair (including a wheelchair)?: A Little Help needed standing up from a chair using your arms (e.g., wheelchair or bedside chair)?: A Lot Help needed to walk in hospital room?: A Lot Help needed climbing 3-5 steps with a railing? : Total 6 Click Score: 14    End of  Session Equipment Utilized During Treatment: Gait belt Activity Tolerance: Patient tolerated treatment well;Patient limited by pain Patient left: in chair;with call bell/phone within reach;with chair alarm set;with family/visitor present Nurse Communication: Mobility status PT Visit Diagnosis: Unsteadiness on feet (R26.81);Muscle weakness (generalized) (M62.81);Pain Pain - Right/Left: Right Pain - part of body: Hip     Time: 1213-1240 PT Time Calculation (min) (ACUTE ONLY): 27 min  Charges:    $Gait Training: 8-22 mins $Therapeutic Activity: 8-22 mins PT General Charges $$ ACUTE PT VISIT: 1 Visit                     Vickki Muff, PT, DPT   Acute Rehabilitation Department Office [phone] Secure Chat Communication Preferred   Ronnie Derby 12/27/2022, 1:45 PM

## 2022-12-27 NOTE — Plan of Care (Signed)

## 2022-12-28 DIAGNOSIS — S3282XD Multiple fractures of pelvis without disruption of pelvic ring, subsequent encounter for fracture with routine healing: Secondary | ICD-10-CM | POA: Diagnosis not present

## 2022-12-28 LAB — BASIC METABOLIC PANEL
Anion gap: 10 (ref 5–15)
BUN: 24 mg/dL — ABNORMAL HIGH (ref 8–23)
CO2: 20 mmol/L — ABNORMAL LOW (ref 22–32)
Calcium: 8.8 mg/dL — ABNORMAL LOW (ref 8.9–10.3)
Chloride: 106 mmol/L (ref 98–111)
Creatinine, Ser: 1.01 mg/dL — ABNORMAL HIGH (ref 0.44–1.00)
GFR, Estimated: 51 mL/min — ABNORMAL LOW (ref >60)
Glucose, Bld: 95 mg/dL (ref 70–99)
Potassium: 3.6 mmol/L (ref 3.5–5.1)
Sodium: 136 mmol/L (ref 135–145)

## 2022-12-28 LAB — CBC
HCT: 34.9 % — ABNORMAL LOW (ref 36.0–46.0)
Hemoglobin: 11.3 g/dL — ABNORMAL LOW (ref 12.0–15.0)
MCH: 30.5 pg (ref 26.0–34.0)
MCHC: 32.4 g/dL (ref 30.0–36.0)
MCV: 94.3 fL (ref 80.0–100.0)
Platelets: 295 10*3/uL (ref 150–400)
RBC: 3.7 MIL/uL — ABNORMAL LOW (ref 3.87–5.11)
RDW: 13.5 % (ref 11.5–15.5)
WBC: 6.4 10*3/uL (ref 4.0–10.5)
nRBC: 0 % (ref 0.0–0.2)

## 2022-12-28 MED ORDER — HYDROCODONE-ACETAMINOPHEN 5-325 MG PO TABS: 0.5000 | ORAL_TABLET | Freq: Four times a day (QID) | ORAL | 0 refills | Status: DC | PRN

## 2022-12-28 MED ORDER — POLYETHYLENE GLYCOL 3350 17 G PO PACK: 17.0000 g | PACK | Freq: Two times a day (BID) | ORAL | 0 refills | Status: DC

## 2022-12-28 MED ORDER — ZOLPIDEM TARTRATE 5 MG PO TABS: 5.0000 mg | ORAL_TABLET | Freq: Every day | ORAL | 0 refills | Status: DC

## 2022-12-28 MED ORDER — QUETIAPINE FUMARATE 25 MG PO TABS: 12.5000 mg | ORAL_TABLET | Freq: Two times a day (BID) | ORAL | Status: DC

## 2022-12-28 MED ORDER — SENNOSIDES-DOCUSATE SODIUM 8.6-50 MG PO TABS: 2.0000 | ORAL_TABLET | Freq: Two times a day (BID) | ORAL | Status: DC

## 2022-12-28 NOTE — TOC Transition Note (Signed)
Transition of Care [facility]) - CM/SW Discharge Note   Patient Details  Name: Evelyn [name] MRN: [id number] Date of Birth: 03-20-1925  [facility] CM/SW Contact:  Evelyn Sons, LCSW Phone Number: 12/28/2022, 10:51 AM   Clinical Narrative:     Patient will DC to: [facility] Anticipated DC date: 12/28/22 Family notified:  Hipp,Lisa (Daughter) [phone] (Mobile  Transport by: Sharin Mons   Per MD patient ready for DC to [facility]. RN, patient, patient's family, and facility notified of DC. Discharge Summary and FL2 sent to facility. RN to call report prior to discharge [phone] Room 34). DC packet on chart. Ambulance transport requested for patient.   CSW will sign off for now as social work intervention is no longer needed. Please consult [place] again if new needs arise.   Final next level of care: Skilled Nursing Facility Barriers to Discharge: No Barriers Identified   Patient Goals and CMS Choice CMS Medicare.gov Compare Post Acute Care list provided to:: Patient Choice offered to / list presented to : Patient  Discharge Placement                Patient chooses bed at: [facility] Patient to be transferred to facility by: [facility] Name of family member notified: Hipp,Lisa (Daughter)  [phone] (Mobile Patient and family notified of of transfer: 12/28/22  Discharge Plan and Services Additional resources added to the After Visit Summary for                                       Social Determinants of Health (SDOH) Interventions SDOH Screenings   Food Insecurity: No Food Insecurity (12/24/2022)  Housing: Low Risk  (12/24/2022)  Transportation Needs: No Transportation Needs (12/24/2022)  Utilities: Not At Risk (12/24/2022)  Alcohol Screen: Low Risk  (09/09/2020)  Depression (PHQ2-9): Low Risk  (09/09/2020)  Financial Resource Strain: Low Risk  (09/09/2020)  Physical Activity: Sufficiently Active (09/09/2020)  Social Connections: Moderately  Integrated (09/09/2020)  Stress: No Stress Concern Present (09/09/2020)  Tobacco Use: Low Risk  (12/23/2022)     Readmission Risk Interventions    04/04/2022    1:26 PM  Readmission Risk Prevention Plan  Transportation Screening Complete  PCP or Specialist Appt within 5-7 Days Complete  Home Care Screening Complete  Medication Review (RN CM) Complete

## 2022-12-28 NOTE — Plan of Care (Signed)

## 2022-12-28 NOTE — Progress Notes (Signed)
Pt's daughter Delories Heinz [phone] requested MD or SW call to inform when pt will be discharged so that she may be prepared to bring appropriate ppwk and belongings to facility when pt will arrive.

## 2022-12-28 NOTE — Discharge Summary (Signed)
[facility]  Physician Discharge Summary   Patient ID: Evelyn [name] MRN: [id number] DOB/AGE: 02-11-1925 89 y.o.  Admit date: 12/23/2022 Discharge date:   12/28/2022   PCP: Frederica Kuster, MD  DISCHARGE DIAGNOSES:  Bilateral pubic rami fracture  AKI (acute kidney injury) (HCC)   UTI (urinary tract infection)   HLD (hyperlipidemia)   Depression   CKD stage 3a, GFR 45-59 ml/min (HCC)   Essential hypertension   History of bilateral hip arthroplasty    RECOMMENDATIONS FOR OUTPATIENT FOLLOW UP: Please check CBC and basic metabolic panel in 1 week Needs to follow-up with orthopedic surgeon, Dr. Steward Drone, in 1 month   Home Health: SNF Equipment/Devices: None  CODE STATUS: Full code  DISCHARGE CONDITION: fair  Diet recommendation: Heart healthy  INITIAL HISTORY: 89 year old female medical history of recurrent UTI, hypertension, hyperlipidemia, CKD stage 3A, left hip replacement 12/17/2021, cognitive impairment, right-sided eye blindness, hiatal hernia, chronic abdominal and back pain who initially presented to emergency department at [facility] for evaluation of urinary tract infection as well as recent fall.  CT pelvis wo contrast showed-cute nondisplaced fracture involving the lateral aspect of the right pubic ramus and acute fracture of the left pubic bone with associated fracture of the left inferior pubic ramus.    Consultants: Orthopedics   Procedures: None   HOSPITAL COURSE:   Bilateral pubic rami fracture CT of the pelvis showed bilateral pubic rami fracture thought to be worse on the right. Seen by orthopedics.  Nonsurgical management.  Weight-bear as tolerated. Pain control.  Bowel regimen. Seen by physical therapy.  SNF is recommended for short-term rehab.    Hospital-acquired delirium Exhibited some paranoia as well.  Started on low-dose Seroquel.  Improved this morning.  Did not require a sitter during this hospital stay.     Right-sided rib  fracture Seems to have only minimal symptoms.  Incentive spirometry.   Urinary tract infection Completed course of ceftriaxone.  Urine culture without any growth.   Essential hypertension Pain is likely contributing to elevated blood pressure readings.  Agitation may have also contributed to elevated readings.  Continue to monitor trends at the skilled nursing facility.  Will not initiate any antihypertensive at this time.   Acute on chronic kidney disease stage IIIa. Came in with creatinine of 1.29.  Resolved.     Normocytic anemia Stable.  No evidence of overt bleeding.   History of depression Continue home medications.    Patient is stable.  Okay for discharge to SNF when bed is available.   PERTINENT LABS:  The results of significant diagnostics from this hospitalization (including imaging, microbiology, ancillary and laboratory) are listed below for reference.    Microbiology: Recent Results (from the past 240 hour(s))  Urine Culture     Status: None   Collection Time: 12/24/22  5:30 AM   Specimen: Urine, Random  Result Value Ref Range Status   Specimen Description URINE, RANDOM  Final   Special Requests NONE Reflexed from [id number]  Final   Culture   Final    NO GROWTH Performed at [facility], [address], [place], [place] [address]    Report Status 12/25/2022 FINAL  Final     Labs:   Basic Metabolic Panel: Recent Labs  Lab 12/23/22 1459 12/24/22 0323 12/25/22 0135 12/28/22 0433  NA 136 135 136 136  K 4.1 3.9 3.9 3.6  CL 108 109 110 106  CO2 18* 17* 19* 20*  GLUCOSE 109* 105* 99 95  BUN 32* 27* 21 24*  CREATININE 1.29* 1.15* 0.95 1.01*  CALCIUM 9.6 8.7* 8.7* 8.8*   Liver Function Tests: Recent Labs  Lab 12/23/22 1459 12/24/22 0323  AST 23 26  ALT 10 15  ALKPHOS 198* 187*  BILITOT 0.6 0.8  PROT 6.7 6.3*  ALBUMIN 3.8 3.1*    CBC: Recent Labs  Lab 12/23/22 1459 12/24/22 0323 12/25/22 0135 12/28/22 0433  WBC 13.3* 11.4* 5.5 6.4   NEUTROABS  --   --  3.5  --   HGB 10.6* 10.0* 10.1* 11.3*  HCT 32.4* 31.0* 31.8* 34.9*  MCV 95.6 98.4 95.8 94.3  PLT 234 234 211 295    IMAGING STUDIES DG Ribs Unilateral W/Chest Right  Result Date: 12/23/2022 CLINICAL DATA:  Status post fall Thursday.  Right rib pain. EXAM: RIGHT RIBS AND CHEST - 3+ VIEW COMPARISON:  None Available. FINDINGS: Bilateral chronic interstitial thickening. No focal consolidation. No pleural effusion or pneumothorax. Heart and mediastinal contours are unremarkable. Large hiatal hernia. Subacute-chronic multiple right posterior rib fractures involving the right posterior third, fourth, fifth, sixth, seventh and eighth ribs. Old left posterior fifth through ninth ribs. IMPRESSION: 1. Subacute-chronic multiple right posterior rib fractures involving the right posterior third, fourth, fifth, sixth, seventh and eighth ribs. Electronically Signed   By: Elige Ko M.D.   On: 12/23/2022 16:41   DG Hip Unilat W or Wo Pelvis 2-3 Views Right  Result Date: 12/23/2022 CLINICAL DATA:  Fall.  Pain. EXAM: DG HIP (WITH OR WITHOUT PELVIS) 2-3V RIGHT COMPARISON:  12/31/2021 FINDINGS: Acute fractures of the right superior inferior pubic rami evident. Acute left pubic bone fracture noted. Advanced degenerative changes in the left hip without evidence for left femoral neck fracture. IMPRESSION: 1. Acute fractures of the right superior and inferior pubic rami. 2. Acute left pubic bone fracture. Electronically Signed   By: Kennith Center M.D.   On: 12/23/2022 16:41   CT PELVIS WO CONTRAST  Result Date: 12/23/2022 CLINICAL DATA:  Follow-up bilateral hip pain. EXAM: CT PELVIS WITHOUT CONTRAST TECHNIQUE: Multidetector CT imaging of the pelvis was performed following the standard protocol without intravenous contrast. RADIATION DOSE REDUCTION: This exam was performed according to the departmental dose-optimization program which includes automated exposure control, adjustment of the mA and/or kV  according to patient size and/or use of iterative reconstruction technique. COMPARISON:  CT stone study 04/02/2022 FINDINGS: Urinary Tract:  Unremarkable. Bowel:  Choose 1 Vascular/Lymphatic: Atherosclerotic calcification distal aorta and common iliac arteries. No pelvic sidewall lymphadenopathy. Reproductive:  There is no adnexal mass. Other:  No intraperitoneal free fluid. Musculoskeletal: Status post left hip hemiarthroplasty. Probable insufficiency fracture of the right sacral ala. Nondisplaced acute appearing fracture identified in the left sacrum (see coronal 45/6 and axial 33/3). There is an acute nondisplaced fracture involving the lateral aspect of the right superior pubic ramus near the acetabulum with acute fracture involving the mid aspect of the right inferior pubic ramus. Acute fracture noted left pubic bone (axial 101/3) with an associated fracture of the left inferior pubic ramus, age indeterminate. Advanced degenerative changes noted in the right hip without evidence for right femoral neck fracture. The entire femoral component of the left hip replacement has not been included on the study. IMPRESSION: 1. Acute nondisplaced fracture involving the lateral aspect of the right superior pubic ramus near the acetabulum with acute fracture involving the mid aspect of the right inferior pubic ramus. 2. Acute fracture of the left pubic bone with an associated fracture of the left  inferior pubic ramus, age indeterminate. 3. Nondisplaced acute appearing fracture of the left sacrum. 4. Fracture of the right sacral ala is likely a chronic insufficiency fracture. 5. Advanced degenerative changes in the right hip without evidence for right femoral neck fracture. 6.  Aortic Atherosclerosis (ICD10-I70.0). Electronically Signed   By: Kennith Center M.D.   On: 12/23/2022 16:39   DG Hip Unilat W or Wo Pelvis 2-3 Views Left  Result Date: 12/23/2022 CLINICAL DATA:  Status post fall.  Hip pain. EXAM: DG HIP (WITH OR  WITHOUT PELVIS) 2-3V LEFT COMPARISON:  01/29/2022 FINDINGS: Bones are diffusely demineralized. SI joints unremarkable. There is an acute fracture of the right superior pubic ramus near the acetabulum with cortical disruption in the inferior right pubic ramus. These findings are new since prior. Possible acute fracture involving the left pubic bone. Degenerative changes noted right hip. Patient is status post left hip hemiarthroplasty without complicating features. IMPRESSION: 1. Acute fractures of the right superior and inferior pubic rami. 2. Possible acute fracture involving the left pubic bone. Cortical irregularity in the inferior left pubic pubic ramus also raises the question of fracture. CT imaging could be used to further evaluate as clinically warranted. 3. Status post left hip hemiarthroplasty without complicating features. Electronically Signed   By: Kennith Center M.D.   On: 12/23/2022 16:32   CT Head Wo Contrast  Result Date: 12/23/2022 CLINICAL DATA:  Head and neck trauma EXAM: CT HEAD WITHOUT CONTRAST CT CERVICAL SPINE WITHOUT CONTRAST TECHNIQUE: Multidetector CT imaging of the head and cervical spine was performed following the standard protocol without intravenous contrast. Multiplanar CT image reconstructions of the cervical spine were also generated. RADIATION DOSE REDUCTION: This exam was performed according to the departmental dose-optimization program which includes automated exposure control, adjustment of the mA and/or kV according to patient size and/or use of iterative reconstruction technique. COMPARISON:  04/02/2022 FINDINGS: CT HEAD FINDINGS Brain: No evidence of acute infarction, hemorrhage, hydrocephalus, extra-axial collection or mass lesion/mass effect. Periventricular white matter hypodensity. Lacunar infarct of the right caudate, unchanged. Vascular: No hyperdense vessel or unexpected calcification. Skull: Normal. Negative for fracture or focal lesion. Sinuses/Orbits: No acute  finding. Other: None. CT CERVICAL SPINE FINDINGS Alignment: Normal. Skull base and vertebrae: No acute fracture. No primary bone lesion or focal pathologic process. Soft tissues and spinal canal: No prevertebral fluid or swelling. No visible canal hematoma. Disc levels: Mild multilevel disc space height loss and osteophytosis. Upper chest: Negative. Other: None. IMPRESSION: 1. No acute intracranial pathology. 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 2. No fracture or static subluxation of the cervical spine. 3. Mild multilevel cervical disc degenerative disease. Electronically Signed   By: Jearld Lesch M.D.   On: 12/23/2022 16:25   CT Cervical Spine Wo Contrast  Result Date: 12/23/2022 CLINICAL DATA:  Head and neck trauma EXAM: CT HEAD WITHOUT CONTRAST CT CERVICAL SPINE WITHOUT CONTRAST TECHNIQUE: Multidetector CT imaging of the head and cervical spine was performed following the standard protocol without intravenous contrast. Multiplanar CT image reconstructions of the cervical spine were also generated. RADIATION DOSE REDUCTION: This exam was performed according to the departmental dose-optimization program which includes automated exposure control, adjustment of the mA and/or kV according to patient size and/or use of iterative reconstruction technique. COMPARISON:  04/02/2022 FINDINGS: CT HEAD FINDINGS Brain: No evidence of acute infarction, hemorrhage, hydrocephalus, extra-axial collection or mass lesion/mass effect. Periventricular white matter hypodensity. Lacunar infarct of the right caudate, unchanged. Vascular: No  hyperdense vessel or unexpected calcification. Skull: Normal. Negative for fracture or focal lesion. Sinuses/Orbits: No acute finding. Other: None. CT CERVICAL SPINE FINDINGS Alignment: Normal. Skull base and vertebrae: No acute fracture. No primary bone lesion or focal pathologic process. Soft tissues and spinal canal: No prevertebral fluid  or swelling. No visible canal hematoma. Disc levels: Mild multilevel disc space height loss and osteophytosis. Upper chest: Negative. Other: None. IMPRESSION: 1. No acute intracranial pathology. 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 2. No fracture or static subluxation of the cervical spine. 3. Mild multilevel cervical disc degenerative disease. Electronically Signed   By: Jearld Lesch M.D.   On: 12/23/2022 16:25    DISCHARGE EXAMINATION: Vitals:   12/28/22 0528 12/28/22 0658 12/28/22 0745 12/28/22 0853  BP: (!) 162/86  (!) 189/71 (!) 140/71  Pulse: 78  82 81  Resp: 20  16   Temp: 98.9 F (37.2 C)  98 F (36.7 C)   TempSrc:      SpO2: 98%  99%   Weight:      Height:  5\' 3"  (1.6 m)     General appearance: Awake alert.  In no distress Resp: Clear to auscultation bilaterally.  Normal effort Cardio: S1-S2 is normal regular.  No S3-S4.  No rubs murmurs or bruit GI: Abdomen is soft.  Nontender nondistended.  Bowel sounds are present normal.  No masses organomegaly   DISPOSITION: SNF  Discharge Instructions     Call MD for:  difficulty breathing, headache or visual disturbances   Complete by: As directed    Call MD for:  extreme fatigue   Complete by: As directed    Call MD for:  persistant dizziness or light-headedness   Complete by: As directed    Call MD for:  persistant nausea and vomiting   Complete by: As directed    Call MD for:  severe uncontrolled pain   Complete by: As directed    Call MD for:  temperature >100.4   Complete by: As directed    Diet - low sodium heart healthy   Complete by: As directed    Discharge instructions   Complete by: As directed    Please review instructions on the discharge summary.  You were cared for by a hospitalist during your hospital stay. If you have any questions about your discharge medications or the care you received while you were in the hospital after you are discharged, you can  call the unit and asked to speak with the hospitalist on call if the hospitalist that took care of you is not available. Once you are discharged, your primary care physician will handle any further medical issues. Please note that NO REFILLS for any discharge medications will be authorized once you are discharged, as it is imperative that you return to your primary care physician (or establish a relationship with a primary care physician if you do not have one) for your aftercare needs so that they can reassess your need for medications and monitor your lab values. If you do not have a primary care physician, you can call [phone] for a physician referral.   Increase activity slowly   Complete by: As directed           Allergies as of 12/28/2022   No Known Allergies      Medication List     STOP taking these medications    docusate sodium 100 MG capsule Commonly known as: COLACE  nitrofurantoin (macrocrystal-monohydrate) 100 MG capsule Commonly known as: MACROBID       TAKE these medications    acetaminophen 500 MG tablet Commonly known as: TYLENOL Take 500 mg by mouth in the morning and at bedtime.   aspirin EC 81 MG tablet Take 81 mg by mouth daily.   AZO CRANBERRY PO Take 1 tablet by mouth daily.   BAZA PROTECT EX Apply 1 application  topically 2 (two) times daily. To buttocks   cholecalciferol 25 MCG (1000 UNIT) tablet Commonly known as: VITAMIN D3 Take 1,000 Units by mouth daily.   DAILY VITE PO Take 1 tablet by mouth daily.   FLUoxetine 40 MG capsule Commonly known as: PROZAC Take 40 mg by mouth daily.   folic acid 1 MG tablet Commonly known as: FOLVITE Take 1 mg by mouth daily.   HYDROcodone-acetaminophen 5-325 MG tablet Commonly known as: NORCO/VICODIN Take 0.5 tablets by mouth every 6 (six) hours as needed for severe pain. What changed:  how much to take reasons to take this   lidocaine 4 % Place 1 patch onto the skin daily. To lower back.    pantoprazole 40 MG tablet Commonly known as: PROTONIX Take 40 mg by mouth daily.   polyethylene glycol 17 g packet Commonly known as: MIRALAX / GLYCOLAX Take 17 g by mouth 2 (two) times daily. What changed:  when to take this additional instructions   PROBIOTIC FORMULA PO Take 1 capsule by mouth daily.   QUEtiapine 25 MG tablet Commonly known as: SEROQUEL Take 0.5 tablets (12.5 mg total) by mouth 2 (two) times daily.   senna-docusate 8.6-50 MG tablet Commonly known as: Senokot-S Take 2 tablets by mouth 2 (two) times daily.   zolpidem 5 MG tablet Commonly known as: AMBIEN Take 1 tablet (5 mg total) by mouth at bedtime.          Follow-up Information     Huel Cote, MD. Schedule an appointment as soon as possible for a visit in 1 month(s).   Specialty: Orthopedic Surgery Why: post hospitalization follow up Contact information: [address] [place] [address] [phone]                 TOTAL DISCHARGE TIME: 35 minutes  Safiyah Cisney Rito Ehrlich  [facility] Pager on [email or web address]  12/28/2022, 9:30 AM

## 2022-12-28 NOTE — Progress Notes (Signed)
Report called to Risk analyst at [facility].

## 2023-01-24 ENCOUNTER — Ambulatory Visit (INDEPENDENT_AMBULATORY_CARE_PROVIDER_SITE_OTHER): Payer: Medicare Other

## 2023-01-24 ENCOUNTER — Ambulatory Visit (INDEPENDENT_AMBULATORY_CARE_PROVIDER_SITE_OTHER): Payer: Medicare Other | Admitting: Orthopaedic Surgery

## 2023-01-24 DIAGNOSIS — S3282XA Multiple fractures of pelvis without disruption of pelvic ring, initial encounter for closed fracture: Secondary | ICD-10-CM

## 2023-01-24 NOTE — Progress Notes (Signed)
Chief Complaint: Follow-up     History of Present Illness:    Evelyn [name] is a 89 y.o. female presents today as follow-up for her SPR IPR fractures which she sustained 1 month prior.  She has been overall doing extremely well on her rehab.  She is now weightbearing fully with a walker.  She endorses minimal to no pain about the hip and pelvis.    Surgical History:   none  PMH/PSH/Family History/Social History/Meds/Allergies:    Past Medical History:  Diagnosis Date   Allergy    Arthritis    Chronic cystitis    Congenital blindness    right eye   Depression    Gastric ulcer    H/O: hematuria    Hepatitis, unspecified    statin related, resolved quickly   Hyperlipidemia    Legally blind in right eye, as defined in [place]    Syncope and collapse 04/01/2014   UTI (lower urinary tract infection)    HISTORY OF UTI   Past Surgical History:  Procedure Laterality Date   ABDOMINAL HYSTERECTOMY     vaginal   BLADDER SUSPENSION     A-P   CATARACT EXTRACTION W/ INTRAOCULAR LENS IMPLANT Left    ESOPHAGOGASTRODUODENOSCOPY     HIP ARTHROPLASTY Left 12/17/2021   Procedure: ARTHROPLASTY BIPOLAR HIP (HEMIARTHROPLASTY);  Surgeon: Eldred Manges, MD;  Location: [facility];  Service: Orthopedics;  Laterality: Left;   OPEN REDUCTION INTERNAL FIXATION (ORIF) DISTAL RADIAL FRACTURE Right 03/09/2016   Procedure: OPEN REDUCTION INTERNAL FIXATION (ORIF) DISTAL RADIAL FRACTURE;  Surgeon: Betha Loa, MD;  Location: [facility];  Service: Orthopedics;  Laterality: Right;   Social History   Socioeconomic History   Marital status: Widowed    Spouse name: Not on file   Number of children: Not on file   Years of education: Not on file   Highest education level: Not on file  Occupational History   Occupation: Retired  Tobacco Use   Smoking status: Never   Smokeless tobacco: Never  Vaping Use   Vaping status: Never Used  Substance and Sexual  Activity   Alcohol use: No   Drug use: No   Sexual activity: Not Currently  Other Topics Concern   Not on file  Social History Narrative   Per [facility] New Patient Packet Abstracted on 12/09/2020:      Diet: Normal       Caffeine: Tea      Married, if yes what year: Widowed, married in 1951      Do you live in a house, apartment, assisted living, condo, trailer, ect: House      Is it one or more stories: More       How many persons live in your home? 1      Pets: No      Highest level or education completed: BA      Current/Past profession: Teacher      Exercise:       Left blank            Type and how often: Left Blank          Living Will: Yes   DNR: Yes   POA/HPOA: Yes      Functional Status:   Do you have difficulty bathing or dressing yourself?  No   Do you have difficulty preparing food or eating? No   Do you have difficulty managing your medications? No   Do you have difficulty managing your finances? No   Do you have difficulty affording your medications? No   Social Determinants of Health   Financial Resource Strain: Low Risk  (09/09/2020)   Overall Financial Resource Strain (CARDIA)    Difficulty of Paying Living Expenses: Not hard at all  Food Insecurity: No Food Insecurity (12/24/2022)   Hunger Vital Sign    Worried About Running Out of Food in the Last Year: Never true    Ran Out of Food in the Last Year: Never true  Transportation Needs: No Transportation Needs (12/24/2022)   PRAPARE - Administrator, Civil Service (Medical): No    Lack of Transportation (Non-Medical): No  Physical Activity: Sufficiently Active (09/09/2020)   Exercise Vital Sign    Days of Exercise per Week: 5 days    Minutes of Exercise per Session: 30 min  Stress: No Stress Concern Present (09/09/2020)   [employer] of Occupational Health - Occupational Stress Questionnaire    Feeling of Stress : Not at all  Social Connections: Moderately Integrated (09/09/2020)    Social Connection and Isolation Panel [NHANES]    Frequency of Communication with Friends and Family: More than three times a week    Frequency of Social Gatherings with Friends and Family: More than three times a week    Attends Religious Services: 1 to 4 times per year    Active Member of [employer] or Organizations: No    Attends Banker Meetings: 1 to 4 times per year    Marital Status: Widowed   Family History  Problem Relation Age of Onset   Cancer Neg Hx        Colon cancer   Stroke Neg Hx    Heart disease Neg Hx    Hyperlipidemia Neg Hx    Hypertension Neg Hx    Kidney disease Neg Hx    Colon polyps Neg Hx    Colon cancer Neg Hx    No Known Allergies Current Outpatient Medications  Medication Sig Dispense Refill   acetaminophen (TYLENOL) 500 MG tablet Take 500 mg by mouth in the morning and at bedtime.     aspirin EC 81 MG tablet Take 81 mg by mouth daily.     Bacillus Coagulans-Inulin (PROBIOTIC FORMULA PO) Take 1 capsule by mouth daily.     cholecalciferol (VITAMIN D3) 25 MCG (1000 UNIT) tablet Take 1,000 Units by mouth daily.     Cranberry-Vitamin C-Probiotic (AZO CRANBERRY PO) Take 1 tablet by mouth daily.     FLUoxetine (PROZAC) 40 MG capsule Take 40 mg by mouth daily.     folic acid (FOLVITE) 1 MG tablet Take 1 mg by mouth daily.     HYDROcodone-acetaminophen (NORCO/VICODIN) 5-325 MG tablet Take 0.5 tablets by mouth every 6 (six) hours as needed for severe pain. 30 tablet 0   lidocaine 4 % Place 1 patch onto the skin daily. To lower back.     Multiple Vitamin (DAILY VITE PO) Take 1 tablet by mouth daily.     pantoprazole (PROTONIX) 40 MG tablet Take 40 mg by mouth daily.     polyethylene glycol (MIRALAX / GLYCOLAX) 17 g packet Take 17 g by mouth 2 (two) times daily. 14 each 0   QUEtiapine (SEROQUEL) 25 MG tablet Take 0.5 tablets (12.5 mg total) by mouth 2 (two)  times daily.     senna-docusate (SENOKOT-S) 8.6-50 MG tablet Take 2 tablets by mouth 2 (two) times  daily.     Skin Protectants, Misc. (BAZA PROTECT EX) Apply 1 application  topically 2 (two) times daily. To buttocks     zolpidem (AMBIEN) 5 MG tablet Take 1 tablet (5 mg total) by mouth at bedtime. 30 tablet 0   No current facility-administered medications for this visit.   No results found.  Review of Systems:   A ROS was performed including pertinent positives and negatives as documented in the HPI.  Physical Exam :   Constitutional: NAD and appears stated age Neurological: Alert and oriented Psych: Appropriate affect and cooperative There were no vitals taken for this visit.   Comprehensive Musculoskeletal Exam:    No tenderness with gentle logroll of the right hip.  She is able to walk with a walker.  Imaging:   Xray (AP pelvis): Healing right SPR IPR fractures   I personally reviewed and interpreted the radiographs.   Assessment:   89 y.o. female with well-healing lateral compression type mechanism and right SPR IPR fractures overall doing well.  At this time she will continue to advance to progress as tolerated.  Given the fact that she has had a left hip fracture requiring hemiarthroplasty as well is a right lateral compression type fracture I have recommended that we begin discussion of treatment for osteoporosis.  I will plan for referral to [facility]  Plan :    -Referral to [facility] for osteoporosis management     I personally saw and evaluated the patient, and participated in the management and treatment plan.  Huel Cote, MD Attending Physician, Orthopedic Surgery  This document was dictated using Dragon voice recognition software. A reasonable attempt at proof reading has been made to minimize errors.

## 2023-01-28 ENCOUNTER — Encounter: Payer: Self-pay | Admitting: Family Medicine

## 2023-01-31 ENCOUNTER — Encounter: Payer: Self-pay | Admitting: Family Medicine

## 2023-02-01 ENCOUNTER — Encounter: Payer: Self-pay | Admitting: Family Medicine

## 2023-02-15 ENCOUNTER — Ambulatory Visit: Payer: Medicare Other | Admitting: Physician Assistant

## 2023-02-15 ENCOUNTER — Telehealth: Payer: Self-pay | Admitting: Physician Assistant

## 2023-02-15 NOTE — Telephone Encounter (Signed)
Patient's daughter Evelyn [name] called asked for a call back concerning the appointment for her mother. Evelyn [name] said she was unaware of the appointment until her mother called her.  She advised patient was confused about the appointment. Evelyn [name] asked how would the appointment benefit her 89 year old mother?    The number to contact Evelyn [name] is [phone]

## 2023-03-17 ENCOUNTER — Ambulatory Visit (INDEPENDENT_AMBULATORY_CARE_PROVIDER_SITE_OTHER): Payer: Medicare Other | Admitting: Adult Health

## 2023-03-17 ENCOUNTER — Encounter: Payer: Self-pay | Admitting: Adult Health

## 2023-03-17 VITALS — BP 121/71 | HR 69 | Temp 96.9°F | Resp 18 | Ht 63.0 in | Wt 106.8 lb

## 2023-03-17 DIAGNOSIS — R110 Nausea: Secondary | ICD-10-CM

## 2023-03-17 DIAGNOSIS — N3000 Acute cystitis without hematuria: Secondary | ICD-10-CM

## 2023-03-17 DIAGNOSIS — G8929 Other chronic pain: Secondary | ICD-10-CM

## 2023-03-17 DIAGNOSIS — M545 Low back pain, unspecified: Secondary | ICD-10-CM

## 2023-03-17 DIAGNOSIS — N1831 Chronic kidney disease, stage 3a: Secondary | ICD-10-CM

## 2023-03-17 MED ORDER — CEFTRIAXONE SODIUM 1 G IJ SOLR
1.0000 g | Freq: Once | INTRAMUSCULAR | Status: AC
Start: 2023-03-17 — End: 2023-03-17
  Administered 2023-03-17: 1 g via INTRAMUSCULAR

## 2023-03-17 NOTE — Progress Notes (Signed)
[facility] clinic  Provider:  Kenard Gower DNP  Code Status:  Full Code  Goals of Care:     12/24/2022    1:00 AM  Advanced Directives  Does Patient Have a Medical Advance Directive? Yes  Type of Advance Directive Living will;Healthcare Power of Attorney  Does patient want to make changes to medical advance directive? No - Patient declined  Copy of Healthcare Power of Attorney in Chart? No - copy requested     Chief Complaint  Patient presents with   Acute Visit    UTI possible shot of antibiotic     HPI: Patient is a 89 y.o. female seen today for an acute visit for UTI. She is a resident of [facility]. He was accompanied today by her son. Patient is currently being treated with Nitrofurantoin for UTI. Per son, patient has no energy, nauseated and can't get out of bed. She was having diarrhea so Miralax was changed to PRN. Patient was previously treated with IV Rocephin drip in the past for UTI. Son is requesting for patient to be treated with IM antibiotic for UTI.   Urine culture showed > 100,000 CFU/ml Klebsiella pneumoniae and was susceptible to Ceftriaxone.   Past Medical History:  Diagnosis Date   Allergy    Arthritis    Chronic cystitis    Congenital blindness    right eye   Depression    Gastric ulcer    H/O: hematuria    Hepatitis, unspecified    statin related, resolved quickly   Hyperlipidemia    Legally blind in right eye, as defined in [place]    Syncope and collapse 04/01/2014   UTI (lower urinary tract infection)    HISTORY OF UTI    Past Surgical History:  Procedure Laterality Date   ABDOMINAL HYSTERECTOMY     vaginal   BLADDER SUSPENSION     A-P   CATARACT EXTRACTION W/ INTRAOCULAR LENS IMPLANT Left    ESOPHAGOGASTRODUODENOSCOPY     HIP ARTHROPLASTY Left 12/17/2021   Procedure: ARTHROPLASTY BIPOLAR HIP (HEMIARTHROPLASTY);  Surgeon: Eldred Manges, MD;  Location: [facility];  Service: Orthopedics;  Laterality: Left;   OPEN REDUCTION INTERNAL  FIXATION (ORIF) DISTAL RADIAL FRACTURE Right 03/09/2016   Procedure: OPEN REDUCTION INTERNAL FIXATION (ORIF) DISTAL RADIAL FRACTURE;  Surgeon: Betha Loa, MD;  Location: [facility];  Service: Orthopedics;  Laterality: Right;    No Known Allergies  Outpatient Encounter Medications as of 03/17/2023  Medication Sig   acetaminophen (TYLENOL) 500 MG tablet Take 500 mg by mouth in the morning and at bedtime.   aspirin EC 81 MG tablet Take 81 mg by mouth daily.   Bacillus Coagulans-Inulin (PROBIOTIC FORMULA PO) Take 1 capsule by mouth daily.   cholecalciferol (VITAMIN D3) 25 MCG (1000 UNIT) tablet Take 1,000 Units by mouth daily.   Cranberry-Vitamin C-Probiotic (AZO CRANBERRY PO) Take 1 tablet by mouth daily.   FLUoxetine (PROZAC) 40 MG capsule Take 40 mg by mouth daily.   folic acid (FOLVITE) 1 MG tablet Take 1 mg by mouth daily.   HYDROcodone-acetaminophen (NORCO/VICODIN) 5-325 MG tablet Take 0.5 tablets by mouth every 6 (six) hours as needed for severe pain.   lidocaine 4 % Place 1 patch onto the skin daily. To lower back.   Multiple Vitamin (DAILY VITE PO) Take 1 tablet by mouth daily.   pantoprazole (PROTONIX) 40 MG tablet Take 40 mg by mouth daily.   polyethylene glycol (MIRALAX / GLYCOLAX) 17 g packet Take  17 g by mouth 2 (two) times daily.   QUEtiapine (SEROQUEL) 25 MG tablet Take 0.5 tablets (12.5 mg total) by mouth 2 (two) times daily.   senna-docusate (SENOKOT-S) 8.6-50 MG tablet Take 2 tablets by mouth 2 (two) times daily.   Skin Protectants, Misc. (BAZA PROTECT EX) Apply 1 application  topically 2 (two) times daily. To buttocks   zolpidem (AMBIEN) 5 MG tablet Take 1 tablet (5 mg total) by mouth at bedtime.   No facility-administered encounter medications on file as of 03/17/2023.    Review of Systems:  Review of Systems  Constitutional:  Negative for appetite change, chills, fatigue and fever.  HENT:  Negative for congestion, hearing loss, rhinorrhea and sore  throat.   Eyes: Negative.   Respiratory:  Negative for cough, shortness of breath and wheezing.   Cardiovascular:  Negative for chest pain, palpitations and leg swelling.  Gastrointestinal:  Positive for nausea. Negative for abdominal pain, constipation, diarrhea and vomiting.  Genitourinary:  Negative for dysuria.  Musculoskeletal:  Negative for arthralgias, back pain and myalgias.  Skin:  Negative for color change, rash and wound.  Neurological:  Positive for weakness. Negative for dizziness and headaches.  Psychiatric/Behavioral:  Negative for behavioral problems. The patient is not nervous/anxious.     Health Maintenance  Topic Date Due   DTaP/Tdap/Td (2 - Tdap) 08/13/2020   INFLUENZA VACCINE  01/20/2023   COVID-19 Vaccine (4 - 2023-24 season) 02/20/2023   Medicare Annual Wellness (AWV)  04/27/2023   Pneumonia Vaccine 20+ Years old  Completed   DEXA SCAN  Completed   Zoster Vaccines- Shingrix  Completed   HPV VACCINES  Aged Out    Physical Exam: Vitals:   03/17/23 1317  BP: 121/71  Pulse: 69  Resp: 18  Temp: (!) 96.9 F (36.1 C)  SpO2: 96%  Weight: 106 lb 12.8 oz (48.4 kg)  Height: 5\' 3"  (1.6 m)   Body mass index is 18.92 kg/m. Physical Exam Constitutional:      Appearance: Normal appearance.  HENT:     Head: Normocephalic and atraumatic.     Nose: Nose normal.     Mouth/Throat:     Mouth: Mucous membranes are moist.  Eyes:     Conjunctiva/sclera: Conjunctivae normal.  Cardiovascular:     Rate and Rhythm: Normal rate and regular rhythm.  Pulmonary:     Effort: Pulmonary effort is normal.     Breath sounds: Normal breath sounds.  Abdominal:     General: Bowel sounds are normal.     Palpations: Abdomen is soft.  Musculoskeletal:     Cervical back: Normal range of motion.  Skin:    General: Skin is warm and dry.  Neurological:     Mental Status: She is alert. Mental status is at baseline.  Psychiatric:        Mood and Affect: Mood normal.         Behavior: Behavior normal.     Labs reviewed: Basic Metabolic Panel: Recent Labs    03/31/22 1444 04/02/22 1758 04/04/22 0530 12/23/22 1459 12/24/22 0323 12/25/22 0135 12/28/22 0433  NA 136   < > 139   < > 135 136 136  K 4.1   < > 3.5   < > 3.9 3.9 3.6  CL 106   < > 118*   < > 109 110 106  CO2 19*   < > 18*   < > 17* 19* 20*  GLUCOSE 103*   < > 88   < >  105* 99 95  BUN 37*   < > 18   < > 27* 21 24*  CREATININE 1.20*   < > 0.68   < > 1.15* 0.95 1.01*  CALCIUM 10.4*   < > 8.9   < > 8.7* 8.7* 8.8*  MG 2.0  --  1.8  --   --   --   --   PHOS  --   --  2.4*  --   --   --   --    < > = values in this interval not displayed.   Liver Function Tests: Recent Labs    04/02/22 1758 12/23/22 1459 12/24/22 0323  AST 54* 23 26  ALT 31 10 15   ALKPHOS 110 198* 187*  BILITOT 0.4 0.6 0.8  PROT 6.5 6.7 6.3*  ALBUMIN 2.7* 3.8 3.1*   Recent Labs    04/02/22 1758  LIPASE 33   No results for input(s): "AMMONIA" in the last 8760 hours. CBC: Recent Labs    04/02/22 1758 04/03/22 0508 12/24/22 0323 12/25/22 0135 12/28/22 0433  WBC 8.4   < > 11.4* 5.5 6.4  NEUTROABS 5.7  --   --  3.5  --   HGB 11.6*   < > 10.0* 10.1* 11.3*  HCT 36.5   < > 31.0* 31.8* 34.9*  MCV 92.2   < > 98.4 95.8 94.3  PLT 267   < > 234 211 295   < > = values in this interval not displayed.   Lipid Panel: No results for input(s): "CHOL", "HDL", "LDLCALC", "TRIG", "CHOLHDL", "LDLDIRECT" in the last 8760 hours. Lab Results  Component Value Date   HGBA1C 5.7 12/05/2014    Procedures since last visit: No results found.  Assessment/Plan  1. Acute cystitis without hematuria - cefTRIAXone (ROCEPHIN) injection 1 g  2. Chronic midline low back pain without sciatica -  continue Tramadol PRN     Labs/tests ordered:   None   Next appt:  Visit date not found

## 2023-03-18 ENCOUNTER — Telehealth: Payer: Self-pay

## 2023-03-18 ENCOUNTER — Ambulatory Visit: Payer: Medicare Other

## 2023-03-18 NOTE — Telephone Encounter (Signed)
Patient's daughter call this afternoon because she wanted to get clarification from Kenard Gower, NP if she needs back to back UTI injections.  Because the patient is still not feeling well and want to know if she can make an appointment for next week because the son is out of town and the daughter cancel the appointment for today because was sleeping. Please Advise  Message sent to [facility], NP

## 2023-03-18 NOTE — Telephone Encounter (Signed)
Patient's daughter has made an upcoming appointment with Venita Sheffield, MD    Message sent to [facility], NP

## 2023-03-21 ENCOUNTER — Emergency Department (HOSPITAL_COMMUNITY): Payer: Medicare Other

## 2023-03-21 ENCOUNTER — Telehealth: Payer: Self-pay | Admitting: *Deleted

## 2023-03-21 ENCOUNTER — Emergency Department (HOSPITAL_COMMUNITY)
Admission: EM | Admit: 2023-03-21 | Discharge: 2023-03-21 | Disposition: A | Discharge status: Skilled Nursing Facility | Payer: Medicare Other | Attending: Emergency Medicine | Admitting: Emergency Medicine

## 2023-03-21 DIAGNOSIS — S0101XA Laceration without foreign body of scalp, initial encounter: Secondary | ICD-10-CM | POA: Insufficient documentation

## 2023-03-21 DIAGNOSIS — S0990XA Unspecified injury of head, initial encounter: Secondary | ICD-10-CM | POA: Diagnosis present

## 2023-03-21 DIAGNOSIS — Z7982 Long term (current) use of aspirin: Secondary | ICD-10-CM | POA: Insufficient documentation

## 2023-03-21 DIAGNOSIS — W06XXXA Fall from bed, initial encounter: Secondary | ICD-10-CM | POA: Insufficient documentation

## 2023-03-21 DIAGNOSIS — W19XXXA Unspecified fall, initial encounter: Secondary | ICD-10-CM

## 2023-03-21 LAB — URINALYSIS, W/ REFLEX TO CULTURE (INFECTION SUSPECTED)
Bilirubin Urine: NEGATIVE
Glucose, UA: NEGATIVE mg/dL
Hgb urine dipstick: NEGATIVE
Ketones, ur: NEGATIVE mg/dL
Leukocytes,Ua: NEGATIVE
Nitrite: NEGATIVE
Protein, ur: NEGATIVE mg/dL
Specific Gravity, Urine: 1.008 (ref 1.005–1.030)
pH: 7 (ref 5.0–8.0)

## 2023-03-21 LAB — CBC WITH DIFFERENTIAL/PLATELET
Abs Immature Granulocytes: 0.12 10*3/uL — ABNORMAL HIGH (ref 0.00–0.07)
Basophils Absolute: 0.1 10*3/uL (ref 0.0–0.1)
Basophils Relative: 1 %
Eosinophils Absolute: 0.2 10*3/uL (ref 0.0–0.5)
Eosinophils Relative: 3 %
HCT: 42.5 % (ref 36.0–46.0)
Hemoglobin: 13.2 g/dL (ref 12.0–15.0)
Immature Granulocytes: 2 %
Lymphocytes Relative: 12 %
Lymphs Abs: 1 10*3/uL (ref 0.7–4.0)
MCH: 30.6 pg (ref 26.0–34.0)
MCHC: 31.1 g/dL (ref 30.0–36.0)
MCV: 98.6 fL (ref 80.0–100.0)
Monocytes Absolute: 0.6 10*3/uL (ref 0.1–1.0)
Monocytes Relative: 8 %
Neutro Abs: 6 10*3/uL (ref 1.7–7.7)
Neutrophils Relative %: 74 %
Platelets: 207 10*3/uL (ref 150–400)
RBC: 4.31 MIL/uL (ref 3.87–5.11)
RDW: 13.7 % (ref 11.5–15.5)
WBC: 8 10*3/uL (ref 4.0–10.5)
nRBC: 0 % (ref 0.0–0.2)

## 2023-03-21 LAB — COMPREHENSIVE METABOLIC PANEL
ALT: 17 U/L (ref 0–44)
AST: 25 U/L (ref 15–41)
Albumin: 3.4 g/dL — ABNORMAL LOW (ref 3.5–5.0)
Alkaline Phosphatase: 102 U/L (ref 38–126)
Anion gap: 12 (ref 5–15)
BUN: 18 mg/dL (ref 8–23)
CO2: 22 mmol/L (ref 22–32)
Calcium: 9.6 mg/dL (ref 8.9–10.3)
Chloride: 103 mmol/L (ref 98–111)
Creatinine, Ser: 0.94 mg/dL (ref 0.44–1.00)
GFR, Estimated: 56 mL/min — ABNORMAL LOW (ref >60)
Glucose, Bld: 92 mg/dL (ref 70–99)
Potassium: 4.5 mmol/L (ref 3.5–5.1)
Sodium: 137 mmol/L (ref 135–145)
Total Bilirubin: 0.7 mg/dL (ref 0.3–1.2)
Total Protein: 7.3 g/dL (ref 6.5–8.1)

## 2023-03-21 MED ORDER — LIDOCAINE-EPINEPHRINE-TETRACAINE (LET) TOPICAL GEL
3.0000 mL | Freq: Once | TOPICAL | Status: AC
  Administered 2023-03-21: 3 mL via TOPICAL
  Filled 2023-03-21: qty 3

## 2023-03-21 MED ORDER — ACETAMINOPHEN 325 MG PO TABS
650.0000 mg | ORAL_TABLET | Freq: Once | ORAL | Status: AC
  Administered 2023-03-21: 650 mg via ORAL
  Filled 2023-03-21: qty 2

## 2023-03-21 MED ORDER — POLYETHYLENE GLYCOL 3350 17 G PO PACK: 17.0000 g | PACK | Freq: Two times a day (BID) | ORAL | Status: DC | PRN

## 2023-03-21 NOTE — Telephone Encounter (Signed)
Message left on Daughter's phone with Jessica's advise.

## 2023-03-21 NOTE — ED Triage Notes (Signed)
Pt rolled out of bed this morning and hit her head on the nightstand. Reports headache and left forehead laceration. No blood thinners. No IV no meds.BP 207/88. No dx hypertension. No LOC. AO x 4. Ambulatory with assistance. No headache or symptoms. Pt blind in right eye. RR 18. HR 79. O2 99%. 8 of 10 pain in head. Arrived in c collar.

## 2023-03-21 NOTE — ED Provider Notes (Addendum)
[place] EMERGENCY DEPARTMENT AT [facility] Provider Note   CSN: [id number] Arrival date & time: 03/21/23  7829     History  Chief Complaint  Patient presents with   Marletta Lor    Evelyn [name] is a 89 y.o. female with PMHx arthritis, congenital blindness, depression, HLD who presents to ED concerned with mechanical fall. Patient stating that she rolled out of bed and hit her head on the nightstand. Currently complaining of headache. No blood thinners. Denies LOC and seizures. Denies nausea and vomiting.  Of note, patient stating that she has had a UTI recently that is being managed by her nursing home with ABX.   Fall       Home Medications Prior to Admission medications   Medication Sig Start Date End Date Taking? Authorizing Provider  acetaminophen (TYLENOL) 500 MG tablet Take 500 mg by mouth in the morning and at bedtime.    [provider]  aspirin EC 81 MG tablet Take 81 mg by mouth daily.    [provider]  Bacillus Coagulans-Inulin (PROBIOTIC FORMULA PO) Take 1 capsule by mouth daily.    [provider]  cholecalciferol (VITAMIN D3) 25 MCG (1000 UNIT) tablet Take 1,000 Units by mouth daily.    [provider]  Cranberry-Vitamin C-Probiotic (AZO CRANBERRY PO) Take 1 tablet by mouth daily.    [provider]  FLUoxetine (PROZAC) 40 MG capsule Take 40 mg by mouth daily.    [provider]  folic acid (FOLVITE) 1 MG tablet Take 1 mg by mouth daily.    [provider]  HYDROcodone-acetaminophen (NORCO/VICODIN) 5-325 MG tablet Take 0.5 tablets by mouth every 6 (six) hours as needed for severe pain. 12/28/22   Osvaldo Shipper, MD  lidocaine 4 % Place 1 patch onto the skin daily. To lower back.    [provider]  Multiple Vitamin (DAILY VITE PO) Take 1 tablet by mouth daily.    [provider]  pantoprazole (PROTONIX) 40 MG tablet Take 40 mg by mouth daily.    [provider]   polyethylene glycol (MIRALAX / GLYCOLAX) 17 g packet Take 17 g by mouth 2 (two) times daily as needed. 03/21/23   Medina-Vargas, Monina C, NP  senna-docusate (SENOKOT-S) 8.6-50 MG tablet Take 2 tablets by mouth 2 (two) times daily. 12/28/22   Osvaldo Shipper, MD  Skin Protectants, Misc. (BAZA PROTECT EX) Apply 1 application  topically 2 (two) times daily. To buttocks    [provider]  zolpidem (AMBIEN) 5 MG tablet Take 1 tablet (5 mg total) by mouth at bedtime. 12/28/22   Osvaldo Shipper, MD      Allergies    Patient has no known allergies.    Review of Systems   Review of Systems  Musculoskeletal:        Fall    Physical Exam Updated Vital Signs BP (!) 204/80 (BP Location: Right Arm)   Pulse 70   Temp 97.7 F (36.5 C) (Oral)   Resp (!) 30   SpO2 99%  Physical Exam Vitals and nursing note reviewed.  Constitutional:      General: She is not in acute distress.    Appearance: She is not ill-appearing or toxic-appearing.  HENT:     Head: Normocephalic and atraumatic.     Mouth/Throat:     Mouth: Mucous membranes are moist.  Eyes:     General: No scleral icterus.       Right eye: No discharge.  Left eye: No discharge.     Conjunctiva/sclera: Conjunctivae normal.  Cardiovascular:     Rate and Rhythm: Normal rate and regular rhythm.     Pulses: Normal pulses.     Heart sounds: Normal heart sounds. No murmur heard. Pulmonary:     Effort: Pulmonary effort is normal. No respiratory distress.     Breath sounds: Normal breath sounds. No wheezing, rhonchi or rales.  Abdominal:     General: Abdomen is flat. Bowel sounds are normal.  Musculoskeletal:     Right lower leg: No edema.     Left lower leg: No edema.  Skin:    General: Skin is warm and dry.     Findings: No rash.     Comments: 5cm laceration on upper left scalp. No active bleeding, contamination, or purulence. No other lacerations.   Neurological:     General: No focal deficit present.     Mental  Status: She is alert. Mental status is at baseline.     Comments: GCS 15. Speech is goal oriented. No deficits appreciated to CN III-XII; symmetric eyebrow raise, no facial drooping, tongue midline. Patient has equal grip strength bilaterally with 5/5 strength against resistance in all major muscle groups bilaterally. Sensation to light touch intact. Patient moves extremities without ataxia. Patient ambulatory with steady gait.  Legally blind in right eye  Psychiatric:        Mood and Affect: Mood normal.        Behavior: Behavior normal.     ED Results / Procedures / Treatments   Labs (all labs ordered are listed, but only abnormal results are displayed) Labs Reviewed - No data to display  EKG None  Radiology No results found.  Procedures .Marland KitchenLaceration Repair  Date/Time: 03/21/2023 12:26 PM  Performed by: Dorthy Cooler, PA-C Authorized by: Dorthy Cooler, PA-C   Consent:    Consent obtained:  Verbal   Consent given by:  Patient   Risks, benefits, and alternatives were discussed: yes     Risks discussed:  Infection, pain, need for additional repair, poor cosmetic result, nerve damage, poor wound healing, tendon damage and vascular damage   Alternatives discussed:  No treatment Universal protocol:    Procedure explained and questions answered to patient or proxy's satisfaction: yes     Patient identity confirmed:  Verbally with patient Anesthesia:    Anesthesia method:  Topical application   Topical anesthetic:  LET Laceration details:    Location:  Scalp   Scalp location:  L parietal   Length (cm):  5 Pre-procedure details:    Preparation:  Patient was prepped and draped in usual sterile fashion Exploration:    Hemostasis achieved with:  Direct pressure Treatment:    Area cleansed with:  Povidone-iodine and saline   Irrigation solution:  Sterile saline   Irrigation volume:    Irrigation method:  Pressure wash Skin repair:    Repair method:   Staples   Number of staples:  6 Approximation:    Approximation:  Close Repair type:    Repair type:  Simple Post-procedure details:    Dressing:  Antibiotic ointment and non-adherent dressing   Procedure completion:  Tolerated well, no immediate complications     Medications Ordered in ED Medications - No data to display  ED Course/ Medical Decision Making/ A&P  Medical Decision Making Amount and/or Complexity of Data Reviewed Labs: ordered. Radiology: ordered.  Risk OTC drugs.   This patient presents to the ED after a fall, this involves an extensive number of treatment options, and is a complaint that carries with it a high risk of complications and morbidity.  The differential diagnosis includes  intracranial hemorrhage, subdural/epidural hematoma, vertebral fracture, spinal cord injury, muscle strain, skull fracture, fracture.   Co morbidities that complicate the patient evaluation  arthritis, congenital blindness, depression, HLD   Additional history obtained:  Patient with outpatient appointments for ABX injections d/t UTI   Lab Tests:  I Ordered, and personally interpreted labs.  The pertinent results include:   - CBC: No concern for anemia or leukocytosis - CMP: no concern for electrolyte abnormality; no concern for kidney/liver damage - UA: not concerning for infection   Imaging Studies ordered:  I ordered imaging studies including  -CT head/cervical spine: to assess for complications from patient's fall I independently visualized and interpreted imaging I agree with the radiologist interpretation     Problem List / ED Course / Critical interventions / Medication management  Patient presented for Fall. Patient with stable vitals and does not appear to be in distress. Neuro and physical exam unremarkable. Patient afebrile with stable vitals. Provided patient with tylenol which helped relieve her headache. CT head  and cervical spine without acute abnormalities. I was able to place 5 staples into patient's 5cm scalp laceration without NV complications. Provide patient and family members with staple care and set date to have staples removed.  CBC without anemia or leukocytosis. CMP reassuring. UA without concern for infection. Patient's daughter at bedside stating that patient has had a UTI treated with Macrobid over the past 7 days. Would like me to test her urine again today and specifically requested me to provide patient with a shot of Rocephin. I shared with patient and family members that her urine is currently reassuring and she does not have leukocytosis - so I do not recommend giving an extra shot of rocephin today. Patient and family endorsed understanding of plan. Recommended following up with PCP later in the week. Patient and family endorsed understanding of plan. I have reviewed the patients home medicines and have made adjustments as needed Patient was given return precautions. Patient stable for discharge at this time. Patient verbalized understanding of plan.   DDx: These are considered less likely due to history of present illness and physical exam findings -Intracranial hemorrhage, subdural/epidural hematoma: CT without concern; no neurodeficits -Vertebral fracture: CT without concern - Spinal cord injury: CT without concern; no neurodeficits - Skull fracture: CT without concern - Fracture: No step-offs/crepitus/abnormalities palpated in head, neck, chest, upper extremities, lower extremities, pelvis   Social Determinants of Health:  Nursing Home Resident            Final Clinical Impression(s) / ED Diagnoses Final diagnoses:  None    Rx / DC Orders ED Discharge Orders     None         Dorthy Cooler, [place] 03/21/23 1341    Gwyneth Sprout, MD 03/26/23 [phone]

## 2023-03-21 NOTE — Discharge Instructions (Addendum)
Keep staples dry. Please refrain from swimming or bathing. Area can be gently cleaned with mild soap and water after 24 hours. Please return to local urgent care in 7 to 10 days for staple removal. Failure to remove staples in timely manner can result in infection.  Seek emergency care if experiencing any new or worsening symptoms.
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
## 2023-03-21 NOTE — Telephone Encounter (Signed)
She has appt with Dr Seth Bake tomorrow, recommend keep appt to discuss

## 2023-03-22 ENCOUNTER — Encounter (INDEPENDENT_AMBULATORY_CARE_PROVIDER_SITE_OTHER): Payer: Medicare Other | Admitting: Sports Medicine

## 2023-03-23 NOTE — Progress Notes (Signed)
This encounter was created in error - please disregard.

## 2023-03-31 ENCOUNTER — Telehealth: Payer: Self-pay

## 2023-03-31 NOTE — Telephone Encounter (Signed)
Message left on clinical intake voicemail:   Patient had the RSV vaccine last year and her son is questioning the frequency on receiving that vaccine. Evelyn [name] states he seen info about receiving every year and has also come across info about receiving every other year.  Please advise

## 2023-03-31 NOTE — Telephone Encounter (Signed)
Evelyn Sheffield, MD  You1 hour ago (1:43 PM)    She is good for 2 yrs.   Called returned to [employer] and he verbalized understanding of the above response

## 2023-07-12 ENCOUNTER — Inpatient Hospital Stay (HOSPITAL_COMMUNITY)
Admission: EM | Admit: 2023-07-12 | Discharge: 2023-07-18 | DRG: 535 | Disposition: A | Discharge status: Skilled Nursing Facility | Payer: Medicare Other | Attending: Internal Medicine | Admitting: Internal Medicine

## 2023-07-12 ENCOUNTER — Encounter (HOSPITAL_COMMUNITY): Payer: Self-pay | Admitting: Internal Medicine

## 2023-07-12 ENCOUNTER — Emergency Department (HOSPITAL_COMMUNITY): Payer: Medicare Other

## 2023-07-12 ENCOUNTER — Other Ambulatory Visit: Payer: Self-pay

## 2023-07-12 DIAGNOSIS — J439 Emphysema, unspecified: Secondary | ICD-10-CM | POA: Diagnosis present

## 2023-07-12 DIAGNOSIS — D539 Nutritional anemia, unspecified: Secondary | ICD-10-CM | POA: Diagnosis present

## 2023-07-12 DIAGNOSIS — S72009D Fracture of unspecified part of neck of unspecified femur, subsequent encounter for closed fracture with routine healing: Secondary | ICD-10-CM

## 2023-07-12 DIAGNOSIS — R54 Age-related physical debility: Secondary | ICD-10-CM | POA: Diagnosis present

## 2023-07-12 DIAGNOSIS — Z515 Encounter for palliative care: Secondary | ICD-10-CM

## 2023-07-12 DIAGNOSIS — M25552 Pain in left hip: Principal | ICD-10-CM

## 2023-07-12 DIAGNOSIS — Z7982 Long term (current) use of aspirin: Secondary | ICD-10-CM

## 2023-07-12 DIAGNOSIS — N390 Urinary tract infection, site not specified: Secondary | ICD-10-CM | POA: Diagnosis not present

## 2023-07-12 DIAGNOSIS — Z90710 Acquired absence of both cervix and uterus: Secondary | ICD-10-CM

## 2023-07-12 DIAGNOSIS — Z96649 Presence of unspecified artificial hip joint: Secondary | ICD-10-CM

## 2023-07-12 DIAGNOSIS — E871 Hypo-osmolality and hyponatremia: Secondary | ICD-10-CM | POA: Diagnosis present

## 2023-07-12 DIAGNOSIS — S72122A Displaced fracture of lesser trochanter of left femur, initial encounter for closed fracture: Secondary | ICD-10-CM | POA: Diagnosis not present

## 2023-07-12 DIAGNOSIS — S72009A Fracture of unspecified part of neck of unspecified femur, initial encounter for closed fracture: Secondary | ICD-10-CM | POA: Diagnosis not present

## 2023-07-12 DIAGNOSIS — M9702XA Periprosthetic fracture around internal prosthetic left hip joint, initial encounter: Secondary | ICD-10-CM | POA: Diagnosis present

## 2023-07-12 DIAGNOSIS — M978XXA Periprosthetic fracture around other internal prosthetic joint, initial encounter: Secondary | ICD-10-CM

## 2023-07-12 DIAGNOSIS — Y92239 Unspecified place in hospital as the place of occurrence of the external cause: Secondary | ICD-10-CM | POA: Diagnosis present

## 2023-07-12 DIAGNOSIS — Z66 Do not resuscitate: Secondary | ICD-10-CM | POA: Diagnosis present

## 2023-07-12 DIAGNOSIS — G928 Other toxic encephalopathy: Secondary | ICD-10-CM | POA: Diagnosis present

## 2023-07-12 DIAGNOSIS — F32A Depression, unspecified: Secondary | ICD-10-CM | POA: Diagnosis present

## 2023-07-12 DIAGNOSIS — K219 Gastro-esophageal reflux disease without esophagitis: Secondary | ICD-10-CM | POA: Diagnosis present

## 2023-07-12 DIAGNOSIS — T3995XA Adverse effect of unspecified nonopioid analgesic, antipyretic and antirheumatic, initial encounter: Secondary | ICD-10-CM | POA: Diagnosis present

## 2023-07-12 DIAGNOSIS — M8580 Other specified disorders of bone density and structure, unspecified site: Secondary | ICD-10-CM | POA: Diagnosis present

## 2023-07-12 DIAGNOSIS — E785 Hyperlipidemia, unspecified: Secondary | ICD-10-CM | POA: Diagnosis present

## 2023-07-12 DIAGNOSIS — Z800 Family history of malignant neoplasm of digestive organs: Secondary | ICD-10-CM

## 2023-07-12 DIAGNOSIS — Z681 Body mass index (BMI) 19 or less, adult: Secondary | ICD-10-CM

## 2023-07-12 DIAGNOSIS — Z79899 Other long term (current) drug therapy: Secondary | ICD-10-CM

## 2023-07-12 DIAGNOSIS — Z9181 History of falling: Secondary | ICD-10-CM

## 2023-07-12 DIAGNOSIS — E876 Hypokalemia: Secondary | ICD-10-CM | POA: Diagnosis not present

## 2023-07-12 DIAGNOSIS — N179 Acute kidney failure, unspecified: Secondary | ICD-10-CM | POA: Diagnosis present

## 2023-07-12 DIAGNOSIS — Z8711 Personal history of peptic ulcer disease: Secondary | ICD-10-CM

## 2023-07-12 DIAGNOSIS — M159 Polyosteoarthritis, unspecified: Secondary | ICD-10-CM | POA: Diagnosis present

## 2023-07-12 DIAGNOSIS — F05 Delirium due to known physiological condition: Secondary | ICD-10-CM | POA: Diagnosis not present

## 2023-07-12 DIAGNOSIS — B964 Proteus (mirabilis) (morganii) as the cause of diseases classified elsewhere: Secondary | ICD-10-CM | POA: Diagnosis not present

## 2023-07-12 DIAGNOSIS — M4854XS Collapsed vertebra, not elsewhere classified, thoracic region, sequela of fracture: Secondary | ICD-10-CM | POA: Diagnosis present

## 2023-07-12 DIAGNOSIS — H548 Legal blindness, as defined in USA: Secondary | ICD-10-CM | POA: Diagnosis present

## 2023-07-12 DIAGNOSIS — K449 Diaphragmatic hernia without obstruction or gangrene: Secondary | ICD-10-CM | POA: Diagnosis present

## 2023-07-12 DIAGNOSIS — F419 Anxiety disorder, unspecified: Secondary | ICD-10-CM | POA: Diagnosis present

## 2023-07-12 DIAGNOSIS — W010XXA Fall on same level from slipping, tripping and stumbling without subsequent striking against object, initial encounter: Secondary | ICD-10-CM | POA: Diagnosis present

## 2023-07-12 LAB — BASIC METABOLIC PANEL
Anion gap: 8 (ref 5–15)
BUN: 30 mg/dL — ABNORMAL HIGH (ref 8–23)
CO2: 20 mmol/L — ABNORMAL LOW (ref 22–32)
Calcium: 8.8 mg/dL — ABNORMAL LOW (ref 8.9–10.3)
Chloride: 106 mmol/L (ref 98–111)
Creatinine, Ser: 0.82 mg/dL (ref 0.44–1.00)
GFR, Estimated: >60 mL/min (ref >60)
Glucose, Bld: 107 mg/dL — ABNORMAL HIGH (ref 70–99)
Potassium: 4 mmol/L (ref 3.5–5.1)
Sodium: 134 mmol/L — ABNORMAL LOW (ref 135–145)

## 2023-07-12 LAB — CBC WITH DIFFERENTIAL/PLATELET
Abs Immature Granulocytes: 0.09 10*3/uL — ABNORMAL HIGH (ref 0.00–0.07)
Basophils Absolute: 0.1 10*3/uL (ref 0.0–0.1)
Basophils Relative: 0 %
Eosinophils Absolute: 0.1 10*3/uL (ref 0.0–0.5)
Eosinophils Relative: 1 %
HCT: 34.4 % — ABNORMAL LOW (ref 36.0–46.0)
Hemoglobin: 10.9 g/dL — ABNORMAL LOW (ref 12.0–15.0)
Immature Granulocytes: 1 %
Lymphocytes Relative: 9 %
Lymphs Abs: 1.1 10*3/uL (ref 0.7–4.0)
MCH: 31.1 pg (ref 26.0–34.0)
MCHC: 31.7 g/dL (ref 30.0–36.0)
MCV: 98 fL (ref 80.0–100.0)
Monocytes Absolute: 1 10*3/uL (ref 0.1–1.0)
Monocytes Relative: 7 %
Neutro Abs: 10.6 10*3/uL — ABNORMAL HIGH (ref 1.7–7.7)
Neutrophils Relative %: 82 %
Platelets: 215 10*3/uL (ref 150–400)
RBC: 3.51 MIL/uL — ABNORMAL LOW (ref 3.87–5.11)
RDW: 14.5 % (ref 11.5–15.5)
WBC: 12.9 10*3/uL — ABNORMAL HIGH (ref 4.0–10.5)
nRBC: 0 % (ref 0.0–0.2)

## 2023-07-12 MED ORDER — OXYCODONE HCL 5 MG PO TABS: 5.0000 mg | ORAL_TABLET | ORAL | Status: DC | PRN

## 2023-07-12 MED ORDER — ENOXAPARIN SODIUM 40 MG/0.4ML IJ SOSY
40.0000 mg | PREFILLED_SYRINGE | INTRAMUSCULAR | Status: DC
  Administered 2023-07-12: 40 mg via SUBCUTANEOUS
  Filled 2023-07-12: qty 0.4

## 2023-07-12 MED ORDER — FLUOXETINE HCL 20 MG PO CAPS
40.0000 mg | ORAL_CAPSULE | Freq: Every day | ORAL | Status: DC
  Administered 2023-07-12 – 2023-07-18 (×6): 40 mg via ORAL
  Filled 2023-07-12 (×5): qty 2

## 2023-07-12 MED ORDER — ACETAMINOPHEN 325 MG PO TABS: 650.0000 mg | ORAL_TABLET | Freq: Four times a day (QID) | ORAL | Status: DC | PRN

## 2023-07-12 MED ORDER — POLYETHYLENE GLYCOL 3350 17 G PO PACK
17.0000 g | PACK | Freq: Every day | ORAL | Status: DC | PRN
  Filled 2023-07-12: qty 1

## 2023-07-12 MED ORDER — HYDROMORPHONE HCL 1 MG/ML IJ SOLN
0.5000 mg | INTRAMUSCULAR | Status: DC | PRN
  Administered 2023-07-12 – 2023-07-13 (×2): 0.5 mg via INTRAVENOUS
  Filled 2023-07-12 (×2): qty 0.5

## 2023-07-12 MED ORDER — HYDROCODONE-ACETAMINOPHEN 5-325 MG PO TABS
1.0000 | ORAL_TABLET | Freq: Four times a day (QID) | ORAL | Status: DC | PRN
  Administered 2023-07-12 – 2023-07-15 (×4): 1 via ORAL
  Filled 2023-07-12 (×4): qty 1

## 2023-07-12 MED ORDER — ASPIRIN 81 MG PO TBEC
81.0000 mg | DELAYED_RELEASE_TABLET | Freq: Every day | ORAL | Status: DC
  Administered 2023-07-12 – 2023-07-18 (×6): 81 mg via ORAL
  Filled 2023-07-12 (×6): qty 1

## 2023-07-12 MED ORDER — ZOLPIDEM TARTRATE 5 MG PO TABS
5.0000 mg | ORAL_TABLET | Freq: Every day | ORAL | Status: DC
  Administered 2023-07-12 – 2023-07-15 (×4): 5 mg via ORAL
  Filled 2023-07-12 (×4): qty 1

## 2023-07-12 MED ORDER — ACETAMINOPHEN 650 MG RE SUPP: 650.0000 mg | Freq: Four times a day (QID) | RECTAL | Status: DC | PRN

## 2023-07-12 MED ORDER — ONDANSETRON HCL 4 MG/2ML IJ SOLN: 4.0000 mg | Freq: Four times a day (QID) | INTRAMUSCULAR | Status: DC | PRN

## 2023-07-12 MED ORDER — OXYCODONE HCL 5 MG PO TABS
5.0000 mg | ORAL_TABLET | Freq: Once | ORAL | Status: AC
  Administered 2023-07-12: 5 mg via ORAL
  Filled 2023-07-12: qty 1

## 2023-07-12 MED ORDER — ALBUTEROL SULFATE (2.5 MG/3ML) 0.083% IN NEBU
2.5000 mg | INHALATION_SOLUTION | RESPIRATORY_TRACT | Status: DC | PRN
Start: 2023-07-12 — End: 2023-07-18

## 2023-07-12 MED ORDER — ONDANSETRON HCL 4 MG PO TABS: 4.0000 mg | ORAL_TABLET | Freq: Four times a day (QID) | ORAL | Status: DC | PRN

## 2023-07-12 MED ORDER — PANTOPRAZOLE SODIUM 40 MG PO TBEC
40.0000 mg | DELAYED_RELEASE_TABLET | Freq: Every morning | ORAL | Status: DC
Start: 2023-07-12 — End: 2023-07-18
  Administered 2023-07-12 – 2023-07-18 (×7): 40 mg via ORAL
  Filled 2023-07-12 (×7): qty 1

## 2023-07-12 MED ORDER — DOCUSATE SODIUM 100 MG PO CAPS
100.0000 mg | ORAL_CAPSULE | Freq: Two times a day (BID) | ORAL | Status: DC
  Administered 2023-07-12 – 2023-07-18 (×12): 100 mg via ORAL
  Filled 2023-07-12 (×12): qty 1

## 2023-07-12 MED ORDER — BUSPIRONE HCL 5 MG PO TABS
5.0000 mg | ORAL_TABLET | Freq: Two times a day (BID) | ORAL | Status: DC
  Administered 2023-07-12 – 2023-07-15 (×8): 5 mg via ORAL
  Filled 2023-07-12 (×8): qty 1

## 2023-07-12 MED ORDER — ACETAMINOPHEN 500 MG PO TABS
1000.0000 mg | ORAL_TABLET | Freq: Once | ORAL | Status: AC
  Administered 2023-07-12: 1000 mg via ORAL
  Filled 2023-07-12: qty 2

## 2023-07-12 NOTE — Evaluation (Addendum)
Physical Therapy Evaluation Patient Details Name: Evelyn [name] MRN: [id number] DOB: 11-01-1924 Today's Date: 07/12/2023  History of Present Illness  89 yo female presents to therapy following mechanical fall in ALF on 07/12/2023 resulting in a L periprosthetic femur fx. Orthopedics consulted and recommend pain and conservative management with pt LLE 50% WB with use of RW. Pt PMH includes but is not limited to: falls including 12/2022 sacrum and pubic rami fx, depression, allergies, arthritis, blind R eye, HLD, L THA (2023) and R ORIF of radius.  Clinical Impression   Pt admitted with above diagnosis.  Pt currently with functional limitations due to the deficits listed below (see PT Problem List). PT arrived in ED to conduct eval. Pt agreeable and daughter present. Pt and family ed provided on LLE WB restrictions- 50% with use of RW and both parties aware and understanding of conservative management at this time.Pt is c/o pain and burning with urination and UA pending, pt indicating peritoneal pain almost as bad as L LE, 10/10. Pt required mod A for supine to sit EOB, pt indicated slight light headedness seated EOB and Bp decreased from 148/70 to 68/41. PT unable to safely progress with sit to stand and assisted pt to supine with mod A. During that time nursing staff arrived to transport pt to 3rd floor for admission and PT unable to assess Bp in supine.  Patient will benefit from continued inpatient follow up therapy, <3 hours/day.  Pt will benefit from acute skilled PT to increase their independence and safety with mobility to allow discharge.          If plan is discharge home, recommend the following: Two people to help with walking and/or transfers;A lot of help with bathing/dressing/bathroom;Assistance with cooking/housework;Assist for transportation   Can travel by private vehicle        Equipment Recommendations None recommended by PT;Other (comment) (defer to next venue)  Recommendations  for Other Services       Functional Status Assessment Patient has had a recent decline in their functional status and demonstrates the ability to make significant improvements in function in a reasonable and predictable amount of time.     Precautions / Restrictions Precautions Precautions: Fall (hypotensive) Restrictions Weight Bearing Restrictions Per Provider Order: Yes LLE Weight Bearing Per Provider Order: Partial weight bearing LLE Partial Weight Bearing Percentage or Pounds: 50      Mobility  Bed Mobility Overal bed mobility: Needs Assistance Bed Mobility: Sit to Supine, Supine to Sit     Supine to sit: Mod assist, HOB elevated Sit to supine: Mod assist   General bed mobility comments: PT stabilized L LE and provided assist for trunk with transition supine <> sit, total A to scoot to [facility]    Transfers                   General transfer comment: unable to safely assess transfer tasks due to hypotension    Ambulation/Gait               General Gait Details: NT  Stairs            Wheelchair Mobility     Tilt Bed    Modified Rankin (Stroke Patients Only)       Balance  Pertinent Vitals/Pain Pain Assessment Pain Assessment: 0-10 Pain Score: 10-Worst pain ever Pain Location: L LE and peritoneal Pain Descriptors / Indicators: Aching, Burning, Constant, Discomfort, Dull, Grimacing, Guarding Pain Intervention(s): Limited activity within patient's tolerance, Monitored during session, Premedicated before session, Repositioned    Home Living Family/patient expects to be discharged to:: Assisted living ([facility])                 Home Equipment: Rollator (4 wheels);Rolling Walker (2 wheels)      Prior Function Prior Level of Function : Needs assist       Physical Assist : ADLs (physical)   ADLs (physical): IADLs;Bathing Mobility Comments: facility mobility with  rollator       Extremity/Trunk Assessment        Lower Extremity Assessment Lower Extremity Assessment:  (L LE NT due to acure fx, R LE generalized weakness)    Cervical / Trunk Assessment Cervical / Trunk Assessment: Kyphotic  Communication   Communication Communication: No apparent difficulties  Cognition Arousal: Alert Behavior During Therapy: WFL for tasks assessed/performed Overall Cognitive Status: Within Functional Limits for tasks assessed                                          General Comments      Exercises     Assessment/Plan    PT Assessment Patient needs continued PT services  PT Problem List Decreased strength;Decreased activity tolerance;Decreased balance;Decreased mobility;Decreased coordination;Pain       PT Treatment Interventions DME instruction;Gait training;Functional mobility training;Therapeutic activities;Therapeutic exercise;Balance training;Neuromuscular re-education;Patient/family education    PT Goals (Current goals can be found in the Care Plan section)  Acute Rehab PT Goals Patient Stated Goal: to be able to get stronger and go back to ALF, PT Goal Formulation: With patient Time For Goal Achievement: 07/26/23 Potential to Achieve Goals: Good    Frequency Min 1X/week     Co-evaluation               AM-PAC PT "6 Clicks" Mobility  Outcome Measure Help needed turning from your back to your side while in a flat bed without using bedrails?: A Lot Help needed moving from lying on your back to sitting on the side of a flat bed without using bedrails?: A Lot Help needed moving to and from a bed to a chair (including a wheelchair)?: Total Help needed standing up from a chair using your arms (e.g., wheelchair or bedside chair)?: Total Help needed to walk in hospital room?: Total Help needed climbing 3-5 steps with a railing? : Total 6 Click Score: 8    End of Session Equipment Utilized During Treatment: Gait  belt Activity Tolerance: Patient limited by pain;Treatment limited secondary to medical complications (Comment) (hypotension) Patient left: in bed;with nursing/sitter in room;with family/visitor present;with call bell/phone within reach Nurse Communication: Mobility status;Other (comment) (Bp findings, pain report) PT Visit Diagnosis: Other abnormalities of gait and mobility (R26.89);Unsteadiness on feet (R26.81);Repeated falls (R29.6);Muscle weakness (generalized) (M62.81);Difficulty in walking, not elsewhere classified (R26.2);Pain Pain - Right/Left: Left Pain - part of body: Leg;Hip    Time: 1226-1243 PT Time Calculation (min) (ACUTE ONLY): 17 min   Charges:   PT Evaluation $PT Eval Low Complexity: 1 Low   PT General Charges $$ ACUTE PT VISIT: 1 Visit         Johnny Bridge, PT Acute Rehab   Jacqualyn Posey 07/12/2023,  1:19 PM

## 2023-07-12 NOTE — ED Provider Notes (Signed)
[place] EMERGENCY DEPARTMENT AT [facility] Provider Note   CSN: [id number] Arrival date & time: 07/12/23  0550     History  Chief Complaint  Patient presents with   Hip Pain   Fall    Evelyn [name] is a 89 y.o. female.  89 yo F with a chief complaint of a fall.  The patient was trying to go to the bathroom when she slipped and fell onto her left side.  Has a history of a left-sided hip fracture and had repair.  She is not sure if she had a replacements or something else.  She is also complaining of some low back pain after the fall.  She denies head injury denies loss consciousness denies chest pain abdominal pain.   Hip Pain  Fall       Home Medications Prior to Admission medications   Medication Sig Start Date End Date Taking? Authorizing Provider  acetaminophen (TYLENOL) 500 MG tablet Take 500 mg by mouth in the morning and at bedtime.    [provider]  aspirin EC 81 MG tablet Take 81 mg by mouth daily.    [provider]  Bacillus Coagulans-Inulin (PROBIOTIC FORMULA PO) Take 1 capsule by mouth daily.    [provider]  cholecalciferol (VITAMIN D3) 25 MCG (1000 UNIT) tablet Take 1,000 Units by mouth daily.    [provider]  Cranberry-Vitamin C-Probiotic (AZO CRANBERRY PO) Take 1 tablet by mouth daily.    [provider]  FLUoxetine (PROZAC) 40 MG capsule Take 40 mg by mouth daily.    [provider]  folic acid (FOLVITE) 1 MG tablet Take 1 mg by mouth daily.    [provider]  HYDROcodone-acetaminophen (NORCO/VICODIN) 5-325 MG tablet Take 0.5 tablets by mouth every 6 (six) hours as needed for severe pain. 12/28/22   Osvaldo Shipper, MD  lidocaine 4 % Place 1 patch onto the skin daily. To lower back.    [provider]  Multiple Vitamin (DAILY VITE PO) Take 1 tablet by mouth daily.    [provider]  pantoprazole (PROTONIX) 40 MG tablet Take 40 mg by mouth daily.     [provider]  polyethylene glycol (MIRALAX / GLYCOLAX) 17 g packet Take 17 g by mouth 2 (two) times daily as needed. 03/21/23   Medina-Vargas, Monina C, NP  senna-docusate (SENOKOT-S) 8.6-50 MG tablet Take 2 tablets by mouth 2 (two) times daily. 12/28/22   Osvaldo Shipper, MD  Skin Protectants, Misc. (BAZA PROTECT EX) Apply 1 application  topically 2 (two) times daily. To buttocks    [provider]  zolpidem (AMBIEN) 5 MG tablet Take 1 tablet (5 mg total) by mouth at bedtime. 12/28/22   Osvaldo Shipper, MD      Allergies    Patient has no known allergies.    Review of Systems   Review of Systems  Physical Exam Updated Vital Signs BP (!) 176/85 (BP Location: Left Arm)   Pulse 71   Temp 97.6 F (36.4 C) (Oral)   Resp 16   Ht 5\' 3"  (1.6 m)   Wt 47.8 kg   SpO2 97%   BMI 18.67 kg/m  Physical Exam Vitals and nursing note reviewed.  Constitutional:      General: She is not in acute distress.    Appearance: She is well-developed. She is not diaphoretic.  HENT:     Head: Normocephalic and atraumatic.  Eyes:     Pupils: Pupils are  equal, round, and reactive to light.  Cardiovascular:     Rate and Rhythm: Normal rate and regular rhythm.     Heart sounds: No murmur heard.    No friction rub. No gallop.  Pulmonary:     Effort: Pulmonary effort is normal.     Breath sounds: No wheezing or rales.  Abdominal:     General: There is no distension.     Palpations: Abdomen is soft.     Tenderness: There is no abdominal tenderness.  Musculoskeletal:        General: Swelling and tenderness present.     Cervical back: Normal range of motion and neck supple.     Comments: Left lower extremity is shortened compared to the right.  Some pain with internal/external rotation.  No pain with compression of the pelvis.  Pulse motor and sensation are intact distally.  She has some mild midline L-spine tenderness though is worse along the SI joint on the left.  Skin:    General: Skin  is warm and dry.  Neurological:     Mental Status: She is alert and oriented to person, place, and time.  Psychiatric:        Behavior: Behavior normal.     ED Results / Procedures / Treatments   Labs (all labs ordered are listed, but only abnormal results are displayed) Labs Reviewed  CBC WITH DIFFERENTIAL/PLATELET  BASIC METABOLIC PANEL    EKG EKG Interpretation Date/Time:  Tuesday July 12 2023 06:18:37 EST Ventricular Rate:  69 PR Interval:  46 QRS Duration:  93 QT Interval:  407 QTC Calculation: 436 R Axis:   67  Text Interpretation: Sinus rhythm Short PR interval Nonspecific T abnormalities, lateral leads No significant change since last tracing Confirmed by Melene Plan [phone]) on 07/12/2023 6:33:16 AM  Radiology No results found.  Procedures Procedures    Medications Ordered in ED Medications  acetaminophen (TYLENOL) tablet 1,000 mg (1,000 mg Oral Given 07/12/23 0630)  oxyCODONE (Oxy IR/ROXICODONE) immediate release tablet 5 mg (5 mg Oral Given 07/12/23 0630)    ED Course/ Medical Decision Making/ A&P                                 Medical Decision Making Amount and/or Complexity of Data Reviewed Labs: ordered. Radiology: ordered. ECG/medicine tests: ordered.  Risk OTC drugs. Prescription drug management.   89 yo F with a chief complaints of left-sided leg pain after fall.  Patient tried to get up to go to the bathroom this morning and she lost her balance and fell.  Complaining of pain mostly to that left hip.  Will obtain a plain film of the hip and low back.  Screening labs chest x-ray.  Plain film of the L hip concerning for a periprosthetic fx.  Awaiting labs, reads.  Patient care was signed out to Dr. Rhae Hammock, please see their note for further details of care in the ED.  The patients results and plan were reviewed and discussed.   Any x-rays performed were independently reviewed by myself.   Differential diagnosis were considered with the  presenting HPI.  Medications  acetaminophen (TYLENOL) tablet 1,000 mg (1,000 mg Oral Given 07/12/23 0630)  oxyCODONE (Oxy IR/ROXICODONE) immediate release tablet 5 mg (5 mg Oral Given 07/12/23 0630)    Vitals:   07/12/23 0606 07/12/23 0611  BP:  (!) 176/85  Pulse:  71  Resp:  16  Temp:  97.6 F (36.4 C)  TempSrc:  Oral  SpO2:  97%  Weight: 47.8 kg   Height: 5\' 3"  (1.6 m)     Final diagnoses:  Pain of left hip    Admission/ observation were discussed with the admitting physician, patient and/or family and they are comfortable with the plan.          Final Clinical Impression(s) / ED Diagnoses Final diagnoses:  Pain of left hip    Rx / DC Orders ED Discharge Orders     None         Melene Plan, DO 07/12/23 1191

## 2023-07-12 NOTE — ED Triage Notes (Signed)
Pt was going to the bathroom, lost balance, and fell. Landed on left side. Left hip and low back pain. No head trauma, no thinners, no LOC.   128/84 HR 71 RR 16 99% RA

## 2023-07-12 NOTE — H&P (Signed)
History and Physical  Evelyn [name] EAV:[id number] DOB: 05-29-1925 DOA: 07/12/2023  PCP: Patient, No Pcp Per   Chief Complaint: fall, left hip pain   HPI: Evelyn [name] is a 89 y.o. female with medical history significant for depression, hyperlipidemia and prior left hip fracture and arthroplasty, traumatic pelvic fracture, now being admitted to the hospital with recurrent mechanical fall and periprosthetic fracture.  Patient lives in assisted living, she got up off the couch early this morning to go to the bathroom, fell onto her left side.  Denies hitting her head, losing consciousness, or any recent illness or other complaints.  On evaluation in the ER, she has a lesser trochanteric fracture.  ER provider discussed with Dr. Ophelia Charter of orthopedic surgery, who feels this is likely nonoperative.  Hospital admission requested for pain control and physical therapy.  Review of Systems: Please see HPI for pertinent positives and negatives. A complete 10 system review of systems are otherwise negative.  Past Medical History:  Diagnosis Date   Allergy    Arthritis    Chronic cystitis    Congenital blindness    right eye   Depression    Gastric ulcer    H/O: hematuria    Hepatitis, unspecified    statin related, resolved quickly   Hyperlipidemia    Legally blind in right eye, as defined in [place]    Syncope and collapse 04/01/2014   UTI (lower urinary tract infection)    HISTORY OF UTI   Past Surgical History:  Procedure Laterality Date   ABDOMINAL HYSTERECTOMY     vaginal   BLADDER SUSPENSION     A-P   CATARACT EXTRACTION W/ INTRAOCULAR LENS IMPLANT Left    ESOPHAGOGASTRODUODENOSCOPY     HIP ARTHROPLASTY Left 12/17/2021   Procedure: ARTHROPLASTY BIPOLAR HIP (HEMIARTHROPLASTY);  Surgeon: Eldred Manges, MD;  Location: [facility];  Service: Orthopedics;  Laterality: Left;   OPEN REDUCTION INTERNAL FIXATION (ORIF) DISTAL RADIAL FRACTURE Right 03/09/2016   Procedure: OPEN REDUCTION INTERNAL  FIXATION (ORIF) DISTAL RADIAL FRACTURE;  Surgeon: Betha Loa, MD;  Location: [facility];  Service: Orthopedics;  Laterality: Right;   Social History:  reports that she has never smoked. She has never used smokeless tobacco. She reports that she does not drink alcohol and does not use drugs.  No Known Allergies  Family History  Problem Relation Age of Onset   Cancer Neg Hx        Colon cancer   Stroke Neg Hx    Heart disease Neg Hx    Hyperlipidemia Neg Hx    Hypertension Neg Hx    Kidney disease Neg Hx    Colon polyps Neg Hx    Colon cancer Neg Hx      Prior to Admission medications   Medication Sig Start Date End Date Taking? Authorizing Provider  acetaminophen (TYLENOL) 500 MG tablet Take 500 mg by mouth in the morning and at bedtime.    [provider]  aspirin EC 81 MG tablet Take 81 mg by mouth daily.    [provider]  Bacillus Coagulans-Inulin (PROBIOTIC FORMULA PO) Take 1 capsule by mouth daily.    [provider]  cholecalciferol (VITAMIN D3) 25 MCG (1000 UNIT) tablet Take 1,000 Units by mouth daily.    [provider]  Cranberry-Vitamin C-Probiotic (AZO CRANBERRY PO) Take 1 tablet by mouth daily.    [provider]  FLUoxetine (PROZAC) 40 MG capsule Take 40 mg by mouth daily.  [provider]  folic acid (FOLVITE) 1 MG tablet Take 1 mg by mouth daily.    [provider]  HYDROcodone-acetaminophen (NORCO/VICODIN) 5-325 MG tablet Take 0.5 tablets by mouth every 6 (six) hours as needed for severe pain. 12/28/22   Osvaldo Shipper, MD  lidocaine 4 % Place 1 patch onto the skin daily. To lower back.    [provider]  Multiple Vitamin (DAILY VITE PO) Take 1 tablet by mouth daily.    [provider]  pantoprazole (PROTONIX) 40 MG tablet Take 40 mg by mouth daily.    [provider]  polyethylene glycol (MIRALAX / GLYCOLAX) 17 g packet Take 17 g by mouth 2 (two) times daily as  needed. 03/21/23   Medina-Vargas, Monina C, NP  senna-docusate (SENOKOT-S) 8.6-50 MG tablet Take 2 tablets by mouth 2 (two) times daily. 12/28/22   Osvaldo Shipper, MD  Skin Protectants, Misc. (BAZA PROTECT EX) Apply 1 application  topically 2 (two) times daily. To buttocks    [provider]  zolpidem (AMBIEN) 5 MG tablet Take 1 tablet (5 mg total) by mouth at bedtime. 12/28/22   Osvaldo Shipper, MD    Physical Exam: BP (!) 146/77   Pulse 76   Temp 97.6 F (36.4 C) (Oral)   Resp 18   Ht 5\' 3"  (1.6 m)   Wt 47.8 kg   SpO2 97%   BMI 18.67 kg/m  General: Thin elderly female who appears younger than her stated age.  She is alert and oriented x 4.  Her daughter is at the bedside.  She is resting very comfortably and does not appear to be in any distress. Cardiovascular: RRR, no murmurs or rubs, no peripheral edema  Respiratory: clear to auscultation bilaterally, no wheezes, no crackles  Abdomen: soft, nontender, nondistended, normal bowel tones heard  Skin: dry, no rashes  Musculoskeletal: no joint effusions, left hip pain with any movement. Psychiatric: appropriate affect, normal speech  Neurologic: extraocular muscles intact, clear speech, moving all extremities with intact sensorium         Labs on Admission:  Basic Metabolic Panel: Recent Labs  Lab 07/12/23 0728  NA 134*  K 4.0  CL 106  CO2 20*  GLUCOSE 107*  BUN 30*  CREATININE 0.82  CALCIUM 8.8*   Liver Function Tests: No results for input(s): "AST", "ALT", "ALKPHOS", "BILITOT", "PROT", "ALBUMIN" in the last 168 hours. No results for input(s): "LIPASE", "AMYLASE" in the last 168 hours. No results for input(s): "AMMONIA" in the last 168 hours. CBC: Recent Labs  Lab 07/12/23 0728  WBC 12.9*  NEUTROABS 10.6*  HGB 10.9*  HCT 34.4*  MCV 98.0  PLT 215   Cardiac Enzymes: No results for input(s): "CKTOTAL", "CKMB", "CKMBINDEX", "TROPONINI" in the last 168 hours. BNP (last 3 results) No results for input(s):  "BNP" in the last 8760 hours.  ProBNP (last 3 results) No results for input(s): "PROBNP" in the last 8760 hours.  CBG: No results for input(s): "GLUCAP" in the last 168 hours.  Radiological Exams on Admission: DG Hip Unilat W or Wo Pelvis 2-3 Views Left Result Date: 07/12/2023 CLINICAL DATA:  Left hip pain.  Fall injury.  Low back pain. EXAM: DG HIP (WITH OR WITHOUT PELVIS) 2-3V LEFT; CHEST 1 VIEW; LUMBAR SPINE - COMPLETE 4+ VIEW COMPARISON:  CT abdomen pelvis without contrast 04/03/2023, AP chest 12/23/2022, lumbar spine series 12/17/2021, left hip series and AP pelvis 12/23/2022 and CT pelvis 12/23/2022. FINDINGS: Chest AP portable 6:41 a.m.: Moderate-to-large  sized hiatal hernia. Stable mediastinum. Aortic atherosclerosis. The cardiac size is normal.  The lungs are emphysematous but clear. There are multiple healed bilateral ribcage fracture deformities and osteopenia. Slight thoracic dextroscoliosis.  No acute osseous findings. Lumbar spine series routine 5 views: There is diffuse osteopenia. There is a moderate chronic wedge compression fracture deformity of T12, mild-to-moderate chronic height loss of L3, slight wedging of L2. No new or worsening compression fracture is seen. Grade 1 retrolisthesis also is unchanged T12-L1 and L1-L2 and grade 1 anterolisthesis at L2-3 and L5-S1, all believed degenerative. There is mild levoscoliosis as well, with spinous process abutment at L2-5. There is mild lumbar spondylosis. The discs are diffusely degenerative. There is facet hypertrophy moderately from L2-3 down greatest at the lowest 2 levels. AP pelvis and AP and frog-leg left hip: Old left hip replacement. There is acute longitudinal perihardware fracture through the base of the lesser trochanter with medial displacement of the fragment. There is osteopenia with no other visible fractures. Advanced arthrosis noted right hip joint. There are healed fracture deformities of the bilateral pubic rami. Chronic  postsurgical changes are again noted in the pelvic floor. Right-sided pelvic phleboliths. IMPRESSION: 1. Acute longitudinal perihardware fracture through the base of the lesser trochanter of the left femur with medial displacement of the fragment. 2. Osteopenia and degenerative change without evidence of acute lumbar spine fractures. 3. Chronic compression fracture deformities of T12, L3, and L2. 4. Mild thoracic dextroscoliosis and mild lumbar levoscoliosis. 5. Moderate-to-large sized hiatal hernia. 6. Emphysema. 7. Aortic atherosclerosis. 8. Healed fracture deformities of the bilateral pubic rami. Electronically Signed   By: Almira Bar M.D.   On: 07/12/2023 07:54   DG Lumbar Spine Complete Result Date: 07/12/2023 CLINICAL DATA:  Left hip pain.  Fall injury.  Low back pain. EXAM: DG HIP (WITH OR WITHOUT PELVIS) 2-3V LEFT; CHEST 1 VIEW; LUMBAR SPINE - COMPLETE 4+ VIEW COMPARISON:  CT abdomen pelvis without contrast 04/03/2023, AP chest 12/23/2022, lumbar spine series 12/17/2021, left hip series and AP pelvis 12/23/2022 and CT pelvis 12/23/2022. FINDINGS: Chest AP portable 6:41 a.m.: Moderate-to-large sized hiatal hernia. Stable mediastinum. Aortic atherosclerosis. The cardiac size is normal.  The lungs are emphysematous but clear. There are multiple healed bilateral ribcage fracture deformities and osteopenia. Slight thoracic dextroscoliosis.  No acute osseous findings. Lumbar spine series routine 5 views: There is diffuse osteopenia. There is a moderate chronic wedge compression fracture deformity of T12, mild-to-moderate chronic height loss of L3, slight wedging of L2. No new or worsening compression fracture is seen. Grade 1 retrolisthesis also is unchanged T12-L1 and L1-L2 and grade 1 anterolisthesis at L2-3 and L5-S1, all believed degenerative. There is mild levoscoliosis as well, with spinous process abutment at L2-5. There is mild lumbar spondylosis. The discs are diffusely degenerative. There is facet  hypertrophy moderately from L2-3 down greatest at the lowest 2 levels. AP pelvis and AP and frog-leg left hip: Old left hip replacement. There is acute longitudinal perihardware fracture through the base of the lesser trochanter with medial displacement of the fragment. There is osteopenia with no other visible fractures. Advanced arthrosis noted right hip joint. There are healed fracture deformities of the bilateral pubic rami. Chronic postsurgical changes are again noted in the pelvic floor. Right-sided pelvic phleboliths. IMPRESSION: 1. Acute longitudinal perihardware fracture through the base of the lesser trochanter of the left femur with medial displacement of the fragment. 2. Osteopenia and degenerative change without evidence of acute lumbar spine fractures. 3. Chronic compression fracture  deformities of T12, L3, and L2. 4. Mild thoracic dextroscoliosis and mild lumbar levoscoliosis. 5. Moderate-to-large sized hiatal hernia. 6. Emphysema. 7. Aortic atherosclerosis. 8. Healed fracture deformities of the bilateral pubic rami. Electronically Signed   By: Almira Bar M.D.   On: 07/12/2023 07:54   DG Chest 1 View Result Date: 07/12/2023 CLINICAL DATA:  Left hip pain.  Fall injury.  Low back pain. EXAM: DG HIP (WITH OR WITHOUT PELVIS) 2-3V LEFT; CHEST 1 VIEW; LUMBAR SPINE - COMPLETE 4+ VIEW COMPARISON:  CT abdomen pelvis without contrast 04/03/2023, AP chest 12/23/2022, lumbar spine series 12/17/2021, left hip series and AP pelvis 12/23/2022 and CT pelvis 12/23/2022. FINDINGS: Chest AP portable 6:41 a.m.: Moderate-to-large sized hiatal hernia. Stable mediastinum. Aortic atherosclerosis. The cardiac size is normal.  The lungs are emphysematous but clear. There are multiple healed bilateral ribcage fracture deformities and osteopenia. Slight thoracic dextroscoliosis.  No acute osseous findings. Lumbar spine series routine 5 views: There is diffuse osteopenia. There is a moderate chronic wedge compression  fracture deformity of T12, mild-to-moderate chronic height loss of L3, slight wedging of L2. No new or worsening compression fracture is seen. Grade 1 retrolisthesis also is unchanged T12-L1 and L1-L2 and grade 1 anterolisthesis at L2-3 and L5-S1, all believed degenerative. There is mild levoscoliosis as well, with spinous process abutment at L2-5. There is mild lumbar spondylosis. The discs are diffusely degenerative. There is facet hypertrophy moderately from L2-3 down greatest at the lowest 2 levels. AP pelvis and AP and frog-leg left hip: Old left hip replacement. There is acute longitudinal perihardware fracture through the base of the lesser trochanter with medial displacement of the fragment. There is osteopenia with no other visible fractures. Advanced arthrosis noted right hip joint. There are healed fracture deformities of the bilateral pubic rami. Chronic postsurgical changes are again noted in the pelvic floor. Right-sided pelvic phleboliths. IMPRESSION: 1. Acute longitudinal perihardware fracture through the base of the lesser trochanter of the left femur with medial displacement of the fragment. 2. Osteopenia and degenerative change without evidence of acute lumbar spine fractures. 3. Chronic compression fracture deformities of T12, L3, and L2. 4. Mild thoracic dextroscoliosis and mild lumbar levoscoliosis. 5. Moderate-to-large sized hiatal hernia. 6. Emphysema. 7. Aortic atherosclerosis. 8. Healed fracture deformities of the bilateral pubic rami. Electronically Signed   By: Almira Bar M.D.   On: 07/12/2023 07:54   Assessment/Plan Evelyn [name] is a 89 y.o. female with medical history significant for depression, hyperlipidemia and prior left hip fracture and arthroplasty, traumatic pelvic fracture, now being admitted to the hospital with recurrent mechanical fall and periprosthetic fracture.   Lesser trochanteric fracture of the left femur-due to mechanical fall. -Observation  admission -Pain control -PT/OT, orthopedics recommends 50% weightbearing -Dr. Ophelia Charter will consult formally in the morning  Psych-continue Prozac, BuSpar  GERD-Protonix  DVT prophylaxis: Lovenox     Code Status: Limited: Do not attempt resuscitation (DNR) -DNR-LIMITED -Do Not Intubate/DNI .  CODE STATUS was confirmed with the patient and her daughter at the bedside at the time of admission.  Consults called: Orthopedic surgery  Admission status: Observation  Time spent: 49 minutes  Adaline Trejos Sharlette Dense MD [facility] Pager [phone]  If 7PM-7AM, please contact night-coverage [email or web address] Password [email or web address]  07/12/2023, 10:03 AM

## 2023-07-12 NOTE — ED Provider Notes (Signed)
I was asked to follow-up on the imaging reports for the patient who had a fall last night.  Evelyn [name] does appear she has a periprosthetic fracture.  Physical Exam  BP (!) 176/85 (BP Location: Left Arm)   Pulse 71   Temp 97.6 F (36.4 C) (Oral)   Resp 16   Ht 5\' 3"  (1.6 m)   Wt 47.8 kg   SpO2 97%   BMI 18.67 kg/m   Physical Exam General: No acute distress  Procedures  Procedures  ED Course / MDM    Medical Decision Making Amount and/or Complexity of Data Reviewed Labs: ordered. Radiology: ordered. ECG/medicine tests: ordered.  Risk OTC drugs. Prescription drug management. Decision regarding hospitalization.   The patient was found to have a periprosthetic hip fracture.  I did call and discussed this with Dr. Ophelia Charter.  He reviewed the films and felt that this is going to be nonoperative but recommends admission for pain control and PT/OT evaluation.  On reassessment the patient still having a lot of pain with very minimal movement in the bed.  An additional dose of oxycodone is ordered.  A call was placed to hospitalist service for admission for pain control and potential skilled nursing placement.       Durwin Glaze, MD 07/12/23 [id number]

## 2023-07-12 NOTE — Progress Notes (Signed)
Patient ID: Evelyn [name], female   DOB: 1925-05-14, 89 y.o.   MRN: [id number] Fall with previous press-fit hemiarthroplasty monopolar.  Lesser trochanteric fracture.  Hip is reduced.  She would be at increased risk for dislocation of her hip and pain with ambulation but at this point would not require open reduction internal fixation unless stem subsidence or hip becomes unstable.  Will see in a.m. for consultation.  Mobilization with physical therapy with walker and 50% weightbearing recommended.

## 2023-07-13 DIAGNOSIS — S72009A Fracture of unspecified part of neck of unspecified femur, initial encounter for closed fracture: Secondary | ICD-10-CM | POA: Diagnosis present

## 2023-07-13 DIAGNOSIS — D539 Nutritional anemia, unspecified: Secondary | ICD-10-CM | POA: Diagnosis present

## 2023-07-13 DIAGNOSIS — S72122A Displaced fracture of lesser trochanter of left femur, initial encounter for closed fracture: Secondary | ICD-10-CM

## 2023-07-13 DIAGNOSIS — Z515 Encounter for palliative care: Secondary | ICD-10-CM | POA: Diagnosis not present

## 2023-07-13 DIAGNOSIS — Y92239 Unspecified place in hospital as the place of occurrence of the external cause: Secondary | ICD-10-CM | POA: Diagnosis present

## 2023-07-13 DIAGNOSIS — Z66 Do not resuscitate: Secondary | ICD-10-CM | POA: Diagnosis present

## 2023-07-13 DIAGNOSIS — J439 Emphysema, unspecified: Secondary | ICD-10-CM | POA: Diagnosis present

## 2023-07-13 DIAGNOSIS — G928 Other toxic encephalopathy: Secondary | ICD-10-CM | POA: Diagnosis present

## 2023-07-13 DIAGNOSIS — K219 Gastro-esophageal reflux disease without esophagitis: Secondary | ICD-10-CM | POA: Diagnosis present

## 2023-07-13 DIAGNOSIS — Z90710 Acquired absence of both cervix and uterus: Secondary | ICD-10-CM | POA: Diagnosis not present

## 2023-07-13 DIAGNOSIS — Z8711 Personal history of peptic ulcer disease: Secondary | ICD-10-CM | POA: Diagnosis not present

## 2023-07-13 DIAGNOSIS — E876 Hypokalemia: Secondary | ICD-10-CM | POA: Diagnosis not present

## 2023-07-13 DIAGNOSIS — E785 Hyperlipidemia, unspecified: Secondary | ICD-10-CM | POA: Diagnosis present

## 2023-07-13 DIAGNOSIS — Z7982 Long term (current) use of aspirin: Secondary | ICD-10-CM | POA: Diagnosis not present

## 2023-07-13 DIAGNOSIS — N390 Urinary tract infection, site not specified: Secondary | ICD-10-CM | POA: Diagnosis not present

## 2023-07-13 DIAGNOSIS — F419 Anxiety disorder, unspecified: Secondary | ICD-10-CM | POA: Diagnosis present

## 2023-07-13 DIAGNOSIS — Z7189 Other specified counseling: Secondary | ICD-10-CM | POA: Diagnosis not present

## 2023-07-13 DIAGNOSIS — S72122D Displaced fracture of lesser trochanter of left femur, subsequent encounter for closed fracture with routine healing: Secondary | ICD-10-CM | POA: Diagnosis not present

## 2023-07-13 DIAGNOSIS — F32A Depression, unspecified: Secondary | ICD-10-CM | POA: Diagnosis present

## 2023-07-13 DIAGNOSIS — H548 Legal blindness, as defined in USA: Secondary | ICD-10-CM | POA: Diagnosis present

## 2023-07-13 DIAGNOSIS — E871 Hypo-osmolality and hyponatremia: Secondary | ICD-10-CM | POA: Diagnosis present

## 2023-07-13 DIAGNOSIS — M4854XS Collapsed vertebra, not elsewhere classified, thoracic region, sequela of fracture: Secondary | ICD-10-CM | POA: Diagnosis present

## 2023-07-13 DIAGNOSIS — B964 Proteus (mirabilis) (morganii) as the cause of diseases classified elsewhere: Secondary | ICD-10-CM | POA: Diagnosis not present

## 2023-07-13 DIAGNOSIS — M9702XA Periprosthetic fracture around internal prosthetic left hip joint, initial encounter: Secondary | ICD-10-CM | POA: Diagnosis present

## 2023-07-13 DIAGNOSIS — Z800 Family history of malignant neoplasm of digestive organs: Secondary | ICD-10-CM | POA: Diagnosis not present

## 2023-07-13 DIAGNOSIS — W010XXA Fall on same level from slipping, tripping and stumbling without subsequent striking against object, initial encounter: Secondary | ICD-10-CM | POA: Diagnosis present

## 2023-07-13 DIAGNOSIS — S72009D Fracture of unspecified part of neck of unspecified femur, subsequent encounter for closed fracture with routine healing: Secondary | ICD-10-CM | POA: Diagnosis not present

## 2023-07-13 DIAGNOSIS — N179 Acute kidney failure, unspecified: Secondary | ICD-10-CM | POA: Diagnosis present

## 2023-07-13 DIAGNOSIS — F05 Delirium due to known physiological condition: Secondary | ICD-10-CM | POA: Diagnosis not present

## 2023-07-13 DIAGNOSIS — Z681 Body mass index (BMI) 19 or less, adult: Secondary | ICD-10-CM | POA: Diagnosis not present

## 2023-07-13 DIAGNOSIS — R531 Weakness: Secondary | ICD-10-CM | POA: Diagnosis not present

## 2023-07-13 LAB — RETICULOCYTES
Immature Retic Fract: 15.5 % (ref 2.3–15.9)
RBC.: 3.22 MIL/uL — ABNORMAL LOW (ref 3.87–5.11)
Retic Count, Absolute: 64.7 10*3/uL (ref 19.0–186.0)
Retic Ct Pct: 2 % (ref 0.4–3.1)

## 2023-07-13 LAB — BASIC METABOLIC PANEL
Anion gap: 10 (ref 5–15)
BUN: 41 mg/dL — ABNORMAL HIGH (ref 8–23)
CO2: 22 mmol/L (ref 22–32)
Calcium: 9.1 mg/dL (ref 8.9–10.3)
Chloride: 104 mmol/L (ref 98–111)
Creatinine, Ser: 1.23 mg/dL — ABNORMAL HIGH (ref 0.44–1.00)
GFR, Estimated: 40 mL/min — ABNORMAL LOW (ref >60)
Glucose, Bld: 111 mg/dL — ABNORMAL HIGH (ref 70–99)
Potassium: 4.4 mmol/L (ref 3.5–5.1)
Sodium: 136 mmol/L (ref 135–145)

## 2023-07-13 LAB — FOLATE: Folate: >40 ng/mL (ref >5.9)

## 2023-07-13 LAB — IRON AND TIBC
Iron: 35 ug/dL (ref 28–170)
Saturation Ratios: 10 % — ABNORMAL LOW (ref 10.4–31.8)
TIBC: 370 ug/dL (ref 250–450)
UIBC: 335 ug/dL

## 2023-07-13 LAB — CBC
HCT: 32.8 % — ABNORMAL LOW (ref 36.0–46.0)
Hemoglobin: 10.4 g/dL — ABNORMAL LOW (ref 12.0–15.0)
MCH: 31.9 pg (ref 26.0–34.0)
MCHC: 31.7 g/dL (ref 30.0–36.0)
MCV: 100.6 fL — ABNORMAL HIGH (ref 80.0–100.0)
Platelets: 202 10*3/uL (ref 150–400)
RBC: 3.26 MIL/uL — ABNORMAL LOW (ref 3.87–5.11)
RDW: 14.6 % (ref 11.5–15.5)
WBC: 8.3 10*3/uL (ref 4.0–10.5)
nRBC: 0 % (ref 0.0–0.2)

## 2023-07-13 LAB — VITAMIN B12: Vitamin B-12: 387 pg/mL (ref 180–914)

## 2023-07-13 LAB — FERRITIN: Ferritin: 45 ng/mL (ref 11–307)

## 2023-07-13 MED ORDER — SODIUM CHLORIDE 0.9 % IV SOLN
INTRAVENOUS | Status: AC
Start: 2023-07-13 — End: 2023-07-14

## 2023-07-13 MED ORDER — ENOXAPARIN SODIUM 30 MG/0.3ML IJ SOSY
30.0000 mg | PREFILLED_SYRINGE | INTRAMUSCULAR | Status: DC
  Administered 2023-07-13 – 2023-07-18 (×5): 30 mg via SUBCUTANEOUS
  Filled 2023-07-13 (×5): qty 0.3

## 2023-07-13 MED ORDER — VITAMIN D 25 MCG (1000 UNIT) PO TABS
1000.0000 [IU] | ORAL_TABLET | Freq: Every day | ORAL | Status: DC
  Administered 2023-07-13 – 2023-07-18 (×5): 1000 [IU] via ORAL
  Filled 2023-07-13 (×5): qty 1

## 2023-07-13 NOTE — Hospital Course (Addendum)
89 y.o. from assisted living facility female with medical history significant for depression, hyperlipidemia and prior left hip fracture and arthroplasty, traumatic pelvic fracture presented after a fall on the way to the bathroom onto her left side.  Denies hitting her head, losing consciousness, or any recent illness or other complaints. In the ED afebrile vitals fairly stable although BP initially high.  Labs with mild hyponatremia BUN 38 stable creatinine bicarb 22 leukocytosis 12.9 hemoglobin anemic 10.9 g. Underwent imaging with x-ray chest, lumbar spine hip> moderate to large hiatal hernia, emphysema osteopenia diffuse moderate chronic wedge compression fracture T12 L3 L2, old left hip replacement noted, acute periprosthetic fracture at the base of the lesser trochanter of the left femur Dr. Ophelia Charter of orthopedic surger consulted and admitted for further management. Patient continued on conservative management PT OT.  Hospitalization complicated by lethargy and delirium in the setting of UTI poor oral intake narcotics and hospitalization.  Hydrocodone discontinued, treated with IV antibiotics-urine culture with Proteus sensitive ampicillin/Keflex, oral intake picking up overall much more alert awake and at this time plan is for skilled nursing once bed available.

## 2023-07-13 NOTE — Progress Notes (Signed)
PROGRESS NOTE Evelyn [name]  WUJ:[id number] DOB: 06-06-1925 DOA: 07/12/2023 PCP: Patient, No Pcp Per  Brief Narrative/Hospital Course: 89 y.o. from assisted living facility female with medical history significant for depression, hyperlipidemia and prior left hip fracture and arthroplasty, traumatic pelvic fracture presented after a fall on the way to the bathroom onto her left side.  Denies hitting her head, losing consciousness, or any recent illness or other complaints. In the ED afebrile vitals fairly stable although BP initially high.  Labs with mild hyponatremia BUN 38 stable creatinine bicarb 22 leukocytosis 12.9 hemoglobin anemic 10.9 g. Underwent imaging with x-ray chest, lumbar spine hip> moderate to large hiatal hernia, emphysema osteopenia diffuse moderate chronic wedge compression fracture T12 L3 L2, old left hip replacement noted, acute periprosthetic fracture at the base of the lesser trochanter of the left femur Dr. Ophelia Charter of orthopedic surger consulted and admitted for further management.   Subjective: Seen this morning Resting comfortably on the bedside chair Overnight afebrile BP in 120s-160s systolic, on room air Her pain is controlled  Assessment and Plan: Principal Problem:   Bilateral acute nondisplaced hip fracture (HCC)   Lesser trochanter fracture Mechanical fall Increased risk for dislocation of her hip: S/P reduction of hip.  Previous left hip hemiarthroplasty.  Orthopedics consulted plan is for conservative management no need for ORIF, PT OT evaluation pain control and mobilization with 50% weightbearing.  AKI creatinine bumped to 1.2 from 0.8 GFR 40: Add gentle IVF, encouraged p.o. as well  Leukocytosis: ?reactive on admission.  Resolved.  Having some dysuria check UA Recent Labs  Lab 07/12/23 0728 07/13/23 0446  WBC 12.9* 8.3    Macrocytic anemia:appears chronic check anemia panel. Recent Labs  Lab 07/12/23 0728 07/13/23 0446  HGB 10.9* 10.4*  HCT  34.4* 32.8*    Anxiety: Continue home BuSpar, Prozac Hiatal hernia moderate to large: Continue PPI Chronic wedge compression fracture: Chronic.  Cont Pain management. Emphysema of lung: Asymptomatic Diffuse osteopenia: resume vitamin D GERD: cont PPI Mild memory issues/forgetful likely baseline  DVT prophylaxis: enoxaparin (LOVENOX) injection 30 mg Start: 07/13/23 1000 SCDs Start: 07/12/23 0946 Code Status:   Code Status: Limited: Do not attempt resuscitation (DNR) -DNR-LIMITED -Do Not Intubate/DNI  Family Communication: plan of care discussed with patient/none at bedside. Patient status is: Remains hospitalized because of severity of illness Level of care: Med-Surg   Dispo: The patient is from: ALF.  Daughter lives close by            Anticipated disposition: Pending PT OT evaluation .  Anticipate SNF  Objective: Vitals last 24 hrs: Vitals:   07/12/23 1800 07/12/23 2148 07/13/23 0131 07/13/23 0633  BP: 129/64 (!) 94/56 (!) 123/57 (!) 168/59  Pulse: 76 83 81 75  Resp: 16 14 16 16   Temp: 97.8 F (36.6 C) 99.1 F (37.3 C) 97.6 F (36.4 C) 98.6 F (37 C)  TempSrc: Oral Oral Oral Oral  SpO2: 97% 95% 93% 94%  Weight:      Height:       Weight change:   Physical Examination:  General exam: alert awake, frail and thin  HEENT:Oral mucosa moist, Ear/Nose WNL grossly Respiratory system: Bilaterally clear BS,no use of accessory muscle Cardiovascular system: S1 & S2 +, No JVD. Gastrointestinal system: Abdomen soft,NT,ND, BS+ Nervous System: Alert, awake, moving all extremities,and following commands. Extremities: LE edema neg,distal peripheral pulses palpable and warm.  Skin: No rashes,no icterus. MSK: Normal muscle bulk,tone, power   Medications reviewed:  Scheduled Meds:  aspirin EC  81 mg Oral Daily   busPIRone  5 mg Oral BID   cholecalciferol  1,000 Units Oral Daily   docusate sodium  100 mg Oral BID   enoxaparin (LOVENOX) injection  30 mg Subcutaneous Q24H    FLUoxetine  40 mg Oral Daily   pantoprazole  40 mg Oral q AM   zolpidem  5 mg Oral QHS   Continuous Infusions:  sodium chloride        Diet Order             Diet regular Room service appropriate? Yes; Fluid consistency: Thin  Diet effective now                   Intake/Output Summary (Last 24 hours) at 07/13/2023 1033 Last data filed at 07/13/2023 0600 Gross per 24 hour  Intake 830 ml  Output 500 ml  Net 330 ml   Net IO Since Admission: 330 mL [07/13/23 1033]  Wt Readings from Last 3 Encounters:  07/12/23 47.8 kg  03/17/23 48.4 kg  12/28/22 48.5 kg     Unresulted Labs (From admission, onward)     Start     Ordered   07/14/23 0500  Basic metabolic panel  Tomorrow morning,   R        07/13/23 0828   07/13/23 1033  Urinalysis, Routine w reflex microscopic -Urine, Clean Catch  Once,   R       Question:  Specimen Source  Answer:  Urine, Clean Catch   07/13/23 1032          Data Reviewed: I have personally reviewed following labs and imaging studies CBC: Recent Labs  Lab 07/12/23 0728 07/13/23 0446  WBC 12.9* 8.3  NEUTROABS 10.6*  --   HGB 10.9* 10.4*  HCT 34.4* 32.8*  MCV 98.0 100.6*  PLT 215 202   Basic Metabolic Panel:  Recent Labs  Lab 07/12/23 0728 07/13/23 0446  NA 134* 136  K 4.0 4.4  CL 106 104  CO2 20* 22  GLUCOSE 107* 111*  BUN 30* 41*  CREATININE 0.82 1.23*  CALCIUM 8.8* 9.1   GFR: Estimated Creatinine Clearance: 20.2 mL/min (A) (by C-G formula based on SCr of 1.23 mg/dL (H)). No results found for this or any previous visit (from the past 240 hours).  Antimicrobials/Microbiology: Anti-infectives (From admission, onward)    None         Component Value Date/Time   SDES URINE, RANDOM 12/24/2022 0530   SPECREQUEST NONE Reflexed from [id number] 12/24/2022 0530   CULT  12/24/2022 0530    NO GROWTH Performed at [facility], [address], [place], [place] [address]    REPTSTATUS 12/25/2022 FINAL 12/24/2022 0530      Radiology Studies: DG Hip Unilat W or Wo Pelvis 2-3 Views Left Result Date: 07/12/2023 CLINICAL DATA:  Left hip pain.  Fall injury.  Low back pain. EXAM: DG HIP (WITH OR WITHOUT PELVIS) 2-3V LEFT; CHEST 1 VIEW; LUMBAR SPINE - COMPLETE 4+ VIEW COMPARISON:  CT abdomen pelvis without contrast 04/03/2023, AP chest 12/23/2022, lumbar spine series 12/17/2021, left hip series and AP pelvis 12/23/2022 and CT pelvis 12/23/2022. FINDINGS: Chest AP portable 6:41 a.m.: Moderate-to-large sized hiatal hernia. Stable mediastinum. Aortic atherosclerosis. The cardiac size is normal.  The lungs are emphysematous but clear. There are multiple healed bilateral ribcage fracture deformities and osteopenia. Slight thoracic dextroscoliosis.  No acute osseous findings. Lumbar spine series routine 5 views: There is diffuse osteopenia. There is a  moderate chronic wedge compression fracture deformity of T12, mild-to-moderate chronic height loss of L3, slight wedging of L2. No new or worsening compression fracture is seen. Grade 1 retrolisthesis also is unchanged T12-L1 and L1-L2 and grade 1 anterolisthesis at L2-3 and L5-S1, all believed degenerative. There is mild levoscoliosis as well, with spinous process abutment at L2-5. There is mild lumbar spondylosis. The discs are diffusely degenerative. There is facet hypertrophy moderately from L2-3 down greatest at the lowest 2 levels. AP pelvis and AP and frog-leg left hip: Old left hip replacement. There is acute longitudinal perihardware fracture through the base of the lesser trochanter with medial displacement of the fragment. There is osteopenia with no other visible fractures. Advanced arthrosis noted right hip joint. There are healed fracture deformities of the bilateral pubic rami. Chronic postsurgical changes are again noted in the pelvic floor. Right-sided pelvic phleboliths. IMPRESSION: 1. Acute longitudinal perihardware fracture through the base of the lesser trochanter of the  left femur with medial displacement of the fragment. 2. Osteopenia and degenerative change without evidence of acute lumbar spine fractures. 3. Chronic compression fracture deformities of T12, L3, and L2. 4. Mild thoracic dextroscoliosis and mild lumbar levoscoliosis. 5. Moderate-to-large sized hiatal hernia. 6. Emphysema. 7. Aortic atherosclerosis. 8. Healed fracture deformities of the bilateral pubic rami. Electronically Signed   By: Almira Bar M.D.   On: 07/12/2023 07:54   DG Lumbar Spine Complete Result Date: 07/12/2023 CLINICAL DATA:  Left hip pain.  Fall injury.  Low back pain. EXAM: DG HIP (WITH OR WITHOUT PELVIS) 2-3V LEFT; CHEST 1 VIEW; LUMBAR SPINE - COMPLETE 4+ VIEW COMPARISON:  CT abdomen pelvis without contrast 04/03/2023, AP chest 12/23/2022, lumbar spine series 12/17/2021, left hip series and AP pelvis 12/23/2022 and CT pelvis 12/23/2022. FINDINGS: Chest AP portable 6:41 a.m.: Moderate-to-large sized hiatal hernia. Stable mediastinum. Aortic atherosclerosis. The cardiac size is normal.  The lungs are emphysematous but clear. There are multiple healed bilateral ribcage fracture deformities and osteopenia. Slight thoracic dextroscoliosis.  No acute osseous findings. Lumbar spine series routine 5 views: There is diffuse osteopenia. There is a moderate chronic wedge compression fracture deformity of T12, mild-to-moderate chronic height loss of L3, slight wedging of L2. No new or worsening compression fracture is seen. Grade 1 retrolisthesis also is unchanged T12-L1 and L1-L2 and grade 1 anterolisthesis at L2-3 and L5-S1, all believed degenerative. There is mild levoscoliosis as well, with spinous process abutment at L2-5. There is mild lumbar spondylosis. The discs are diffusely degenerative. There is facet hypertrophy moderately from L2-3 down greatest at the lowest 2 levels. AP pelvis and AP and frog-leg left hip: Old left hip replacement. There is acute longitudinal perihardware fracture through  the base of the lesser trochanter with medial displacement of the fragment. There is osteopenia with no other visible fractures. Advanced arthrosis noted right hip joint. There are healed fracture deformities of the bilateral pubic rami. Chronic postsurgical changes are again noted in the pelvic floor. Right-sided pelvic phleboliths. IMPRESSION: 1. Acute longitudinal perihardware fracture through the base of the lesser trochanter of the left femur with medial displacement of the fragment. 2. Osteopenia and degenerative change without evidence of acute lumbar spine fractures. 3. Chronic compression fracture deformities of T12, L3, and L2. 4. Mild thoracic dextroscoliosis and mild lumbar levoscoliosis. 5. Moderate-to-large sized hiatal hernia. 6. Emphysema. 7. Aortic atherosclerosis. 8. Healed fracture deformities of the bilateral pubic rami. Electronically Signed   By: Almira Bar M.D.   On: 07/12/2023 07:54   DG  Chest 1 View Result Date: 07/12/2023 CLINICAL DATA:  Left hip pain.  Fall injury.  Low back pain. EXAM: DG HIP (WITH OR WITHOUT PELVIS) 2-3V LEFT; CHEST 1 VIEW; LUMBAR SPINE - COMPLETE 4+ VIEW COMPARISON:  CT abdomen pelvis without contrast 04/03/2023, AP chest 12/23/2022, lumbar spine series 12/17/2021, left hip series and AP pelvis 12/23/2022 and CT pelvis 12/23/2022. FINDINGS: Chest AP portable 6:41 a.m.: Moderate-to-large sized hiatal hernia. Stable mediastinum. Aortic atherosclerosis. The cardiac size is normal.  The lungs are emphysematous but clear. There are multiple healed bilateral ribcage fracture deformities and osteopenia. Slight thoracic dextroscoliosis.  No acute osseous findings. Lumbar spine series routine 5 views: There is diffuse osteopenia. There is a moderate chronic wedge compression fracture deformity of T12, mild-to-moderate chronic height loss of L3, slight wedging of L2. No new or worsening compression fracture is seen. Grade 1 retrolisthesis also is unchanged T12-L1 and L1-L2  and grade 1 anterolisthesis at L2-3 and L5-S1, all believed degenerative. There is mild levoscoliosis as well, with spinous process abutment at L2-5. There is mild lumbar spondylosis. The discs are diffusely degenerative. There is facet hypertrophy moderately from L2-3 down greatest at the lowest 2 levels. AP pelvis and AP and frog-leg left hip: Old left hip replacement. There is acute longitudinal perihardware fracture through the base of the lesser trochanter with medial displacement of the fragment. There is osteopenia with no other visible fractures. Advanced arthrosis noted right hip joint. There are healed fracture deformities of the bilateral pubic rami. Chronic postsurgical changes are again noted in the pelvic floor. Right-sided pelvic phleboliths. IMPRESSION: 1. Acute longitudinal perihardware fracture through the base of the lesser trochanter of the left femur with medial displacement of the fragment. 2. Osteopenia and degenerative change without evidence of acute lumbar spine fractures. 3. Chronic compression fracture deformities of T12, L3, and L2. 4. Mild thoracic dextroscoliosis and mild lumbar levoscoliosis. 5. Moderate-to-large sized hiatal hernia. 6. Emphysema. 7. Aortic atherosclerosis. 8. Healed fracture deformities of the bilateral pubic rami. Electronically Signed   By: Almira Bar M.D.   On: 07/12/2023 07:54    LOS: 0 days   Total time spent in review of labs and imaging, patient evaluation, formulation of plan, documentation and communication with family: 35 minutes  Lanae Boast, MD  [facility]  07/13/2023, 10:33 AM

## 2023-07-13 NOTE — Progress Notes (Signed)
Physical Therapy Treatment Patient Details Name: Evelyn [name] MRN: [id number] DOB: [birth year] Today's Date: 07/13/2023   History of Present Illness 89 yo female presents to therapy following mechanical fall in ALF on 07/12/2023 resulting in a L periprosthetic femur fx. Orthopedics consulted and recommend pain and conservative management with pt LLE 50% WB with use of RW. Pt PMH includes but is not limited to: falls including 12/2022 sacrum and pubic rami fx, depression, allergies, arthritis, blind R eye, HLD, L THA (2023) and R ORIF of radius.    PT Comments   Pt admitted with above diagnosis.  Pt currently with functional limitations due to the deficits listed below (see PT Problem List). Pt seated in recliner when PT arrived. Daughter present. Pt agreeable to therapy intervention and indicated that her pain was much less seated in recliner vs in bed. No c/o of peritoneal pain or burning today with culture pending. Pt required cues and mod A for sit to stand  from recliner, mod A for standing balance with strong posterior lean with hip and trunk flexion and narrow BOS, pt able to modify posture to min A at RW with B UE support, pt unable to progress today to gait tasks due to pain and ongoing symptomatic hypotension. Pt left seated in recliner, all needs in place and nurse and daughter present. Pt will benefit from acute skilled PT to increase their independence and safety with mobility to allow discharge.      If plan is discharge home, recommend the following: Two people to help with walking and/or transfers;A lot of help with bathing/dressing/bathroom;Assistance with cooking/housework;Assist for transportation   Can travel by private vehicle        Equipment Recommendations  None recommended by PT;Other (comment) (defer to next venue)    Recommendations for Other Services       Precautions / Restrictions Precautions Precautions: Fall Restrictions Weight Bearing Restrictions Per Provider  Order: Yes LLE Weight Bearing Per Provider Order: Partial weight bearing LLE Partial Weight Bearing Percentage or Pounds: 50     Mobility  Bed Mobility               General bed mobility comments: pt seated in recliner when PT arrived    Transfers Overall transfer level: Needs assistance Equipment used: Rolling walker (2 wheels) Transfers: Sit to/from Stand Sit to Stand: Mod assist           General transfer comment: cues for trunk and hip extension once in standing with pt demonstrating strong posterior lean able to modify posture slightly progressing from mod to min A, narrow BOS and B UE support at RW. pt ed provided on LLE 50% WB, 2 standing bouts from recliner 1:20 and 1:18 with pt indicating no increased pain however fatigue, SOB and light headedness. Bp seated at rest 116/51 and in standing 95/63 and O2 saturation on RA 97-98%    Ambulation/Gait               General Gait Details: NT   Stairs             Wheelchair Mobility     Tilt Bed    Modified Rankin (Stroke Patients Only)       Balance Overall balance assessment: History of Falls, Needs assistance Sitting-balance support: Feet supported Sitting balance-Leahy Scale: Fair     Standing balance support: Reliant on assistive device for balance, During functional activity Standing balance-Leahy Scale: Poor Standing balance comment: B UE support at RW and  min to mod A for balance                            Cognition Arousal: Alert Behavior During Therapy: WFL for tasks assessed/performed Overall Cognitive Status: History of cognitive impairments - at baseline                                 General Comments: daughter present        Exercises      General Comments General comments (skin integrity, edema, etc.): awating results of culture/UA      Pertinent Vitals/Pain Pain Assessment Pain Assessment: Faces Faces Pain Scale: Hurts little more Pain  Location: L LE and peritoneal Pain Descriptors / Indicators: Aching, Constant, Discomfort, Dull, Grimacing, Guarding Pain Intervention(s): Limited activity within patient's tolerance, Monitored during session, Repositioned    Home Living                          Prior Function            PT Goals (current goals can now be found in the care plan section) Acute Rehab PT Goals Patient Stated Goal: to be able to get stronger and go back to ALF, PT Goal Formulation: With patient Time For Goal Achievement: 07/26/23 Potential to Achieve Goals: Good Progress towards PT goals: Progressing toward goals    Frequency    Min 1X/week      PT Plan      Co-evaluation              AM-PAC PT "6 Clicks" Mobility   Outcome Measure  Help needed turning from your back to your side while in a flat bed without using bedrails?: A Lot Help needed moving from lying on your back to sitting on the side of a flat bed without using bedrails?: A Lot Help needed moving to and from a bed to a chair (including a wheelchair)?: A Lot Help needed standing up from a chair using your arms (e.g., wheelchair or bedside chair)?: A Lot Help needed to walk in hospital room?: Total Help needed climbing 3-5 steps with a railing? : Total 6 Click Score: 10    End of Session Equipment Utilized During Treatment: Gait belt Activity Tolerance: Patient limited by pain;Treatment limited secondary to medical complications (Comment) (hypotension) Patient left: with nursing/sitter in room;with family/visitor present;with call bell/phone within reach;in chair;with chair alarm set Nurse Communication: Mobility status PT Visit Diagnosis: Other abnormalities of gait and mobility (R26.89);Unsteadiness on feet (R26.81);Repeated falls (R29.6);Muscle weakness (generalized) (M62.81);Difficulty in walking, not elsewhere classified (R26.2);Pain Pain - Right/Left: Left Pain - part of body: Leg;Hip     Time:  9604-5409 PT Time Calculation (min) (ACUTE ONLY): 18 min  Charges:    $Therapeutic Activity: 8-22 mins PT General Charges $$ ACUTE PT VISIT: 1 Visit                     Evelyn [name], PT Acute Rehab    Evelyn [name] 07/13/2023, 4:15 PM

## 2023-07-13 NOTE — [place] FL2 (Signed)
[place] MEDICAID FL2 LEVEL OF CARE FORM     IDENTIFICATION  Patient Name: Evelyn [name] Birthdate: 01/09/1925 Sex: female Admission Date (Current Location): 07/12/2023  [facility] and [place] Number:  Producer, television/film/video and Address:  [facility],  [address], [place] [address]      Provider Number: [id number]  Attending Physician Name and Address:  Lanae Boast, MD  Relative Name and Phone Number:  daughter, Misty Stanley Hipp @ [phone]    Current Level of Care: Hospital Recommended Level of Care: Skilled Nursing Facility Prior Approval Number:    Date Approved/Denied:   PASRR Number: [id number]  Discharge Plan: SNF    Current Diagnoses: Patient Active Problem List   Diagnosis Date Noted   Closed displaced fracture of lesser trochanter of left femur (HCC) 07/13/2023   Closed fracture of ramus of right pubis (HCC) 12/24/2022   History of bilateral hip arthroplasty 12/23/2022   Lower lobe pneumonia 04/03/2022   Debility 04/03/2022   Pyuria 12/18/2021   Fever 12/18/2021   Macrocytic anemia 12/18/2021   Malnutrition of moderate degree 12/18/2021   Fracture of femoral neck, left (HCC) 12/17/2021   Bilateral acute nondisplaced hip fracture (HCC) 12/17/2021   Delirium 11/09/2021   Essential hypertension 11/08/2021   Hypokalemia 11/08/2021   Compression fracture of body of thoracic vertebra (HCC) 11/06/2021   Acute anemia 11/06/2021   Cognitive impairment 11/06/2021   Lactic acidosis 11/06/2021   Black stools    Heme + stool    Peptic ulcer disease    Acute metabolic encephalopathy 11/05/2021   Closed wedge compression fracture of T12 vertebra (HCC) 11/24/2016   Chronic midline low back pain 11/18/2016   Fatigue 11/18/2016   CKD stage 3a, GFR 45-59 ml/min (HCC) 11/18/2016   IBS (irritable bowel syndrome)    UTI (urinary tract infection)    AKI (acute kidney injury) (HCC) 01/30/2015   Routine general medical examination at a health care facility  05/03/2013   Chronic idiopathic constipation 03/31/2012   Other screening mammogram 03/24/2011   Depression 03/06/2010   Insomnia 03/08/2008   HLD (hyperlipidemia) 02/21/2007    Orientation RESPIRATION BLADDER Height & Weight     Self, Time, Situation, Place  Normal Continent Weight: 105 lb 6.4 oz (47.8 kg) Height:  5\' 3"  (160 cm)  BEHAVIORAL SYMPTOMS/MOOD NEUROLOGICAL BOWEL NUTRITION STATUS      Continent Diet (regular)  AMBULATORY STATUS COMMUNICATION OF NEEDS Skin   Extensive Assist Verbally Normal                       Personal Care Assistance Level of Assistance  Bathing, Dressing Bathing Assistance: Limited assistance   Dressing Assistance: Limited assistance     Functional Limitations Info  Sight, Hearing, Speech Sight Info: Adequate Hearing Info: Adequate Speech Info: Adequate    SPECIAL CARE FACTORS FREQUENCY  OT (By licensed OT), PT (By licensed PT)     PT Frequency: 5x/wk OT Frequency: 5x/wk            Contractures Contractures Info: Not present    Additional Factors Info  Code Status, Allergies, Psychotropic Code Status Info: DNR Allergies Info: NKDA Psychotropic Info: see MAR         Current Medications (07/13/2023):  This is the current hospital active medication list Current Facility-Administered Medications  Medication Dose Route Frequency Provider Last Rate Last Admin   0.9 %  sodium chloride infusion   Intravenous Continuous Kc, Ramesh, MD 50 mL/hr at 07/13/23  1215 New Bag at 07/13/23 1215   acetaminophen (TYLENOL) tablet 650 mg  650 mg Oral Q6H PRN Kirby Crigler, Mir M, MD       Or   acetaminophen (TYLENOL) suppository 650 mg  650 mg Rectal Q6H PRN Kirby Crigler, Mir M, MD       albuterol (PROVENTIL) (2.5 MG/3ML) 0.083% nebulizer solution 2.5 mg  2.5 mg Nebulization Q2H PRN Kirby Crigler, Mir M, MD       aspirin EC tablet 81 mg  81 mg Oral Daily Kirby Crigler, Mir M, MD   81 mg at 07/13/23 0935   busPIRone (BUSPAR) tablet 5 mg  5 mg Oral BID  Kirby Crigler, Mir M, MD   5 mg at 07/13/23 0935   cholecalciferol (VITAMIN D3) 25 MCG (1000 UNIT) tablet 1,000 Units  1,000 Units Oral Daily Kc, Dayna Barker, MD   1,000 Units at 07/13/23 0934   docusate sodium (COLACE) capsule 100 mg  100 mg Oral BID Kirby Crigler, Mir M, MD   100 mg at 07/13/23 0934   enoxaparin (LOVENOX) injection 30 mg  30 mg Subcutaneous Q24H Len Childs T, RPH   30 mg at 07/13/23 0935   FLUoxetine (PROZAC) capsule 40 mg  40 mg Oral Daily Kirby Crigler, Mir M, MD   40 mg at 07/13/23 0935   HYDROcodone-acetaminophen (NORCO/VICODIN) 5-325 MG per tablet 1 tablet  1 tablet Oral Q6H PRN Maryln Gottron, MD   1 tablet at 07/12/23 1804   HYDROmorphone (DILAUDID) injection 0.5 mg  0.5 mg Intravenous Q4H PRN Kirby Crigler, Mir M, MD   0.5 mg at 07/13/23 0641   ondansetron (ZOFRAN) tablet 4 mg  4 mg Oral Q6H PRN Kirby Crigler, Mir M, MD       Or   ondansetron [facility]) injection 4 mg  4 mg Intravenous Q6H PRN Kirby Crigler, Mir M, MD       pantoprazole (PROTONIX) EC tablet 40 mg  40 mg Oral q AM Kirby Crigler, Mir M, MD   40 mg at 07/13/23 0641   polyethylene glycol (MIRALAX / GLYCOLAX) packet 17 g  17 g Oral Daily PRN Kirby Crigler, Mir M, MD       zolpidem Remus Loffler) tablet 5 mg  5 mg Oral QHS Kirby Crigler, Mir M, MD   5 mg at 07/12/23 2211     Discharge Medications: Please see discharge summary for a list of discharge medications.  Relevant Imaging Results:  Relevant Lab Results:   Additional Information SSN: [id number]  Amada Jupiter, LCSW

## 2023-07-13 NOTE — TOC Initial Note (Signed)
Transition of Care [facility]) - Initial/Assessment Note    Patient Details  Name: Evelyn [name] MRN: [id number] Date of Birth: [birth year]  [facility]) CM/SW Contact:    Evelyn Jupiter, LCSW Phone Number: 07/13/2023, 2:12 PM  Clinical Narrative:                  Met with pt and daughter today to review dc planning needs.  Both confirming that pt is a resident in ALF at [facility] (~ 64yr).  They understanding that pt has suffered a hip fx with this fall, however, ortho MD does not recommend surgical intervention (only 50% WB).  PT has recommended SNF for rehab prior to returning to ALF and patient/ daughter are in agreement with this plan.  They would prefer [facility] where pt has completed rehab in the past but discussed other preferred SNFs as well.  Will begin SNF bed search.     Expected Discharge Plan: Skilled Nursing Facility Barriers to Discharge: Continued Medical Work up, SNF Pending bed offer   Patient Goals and CMS Choice Patient states their goals for this hospitalization and ongoing recovery are:: return to ALF once SNF rehab complete          Expected Discharge Plan and Services In-house Referral: Clinical Social Work   Post Acute Care Choice: Skilled Nursing Facility Living arrangements for the past 2 months: Assisted Living Facility ([facility])                 DME Arranged: N/A                    Prior Living Arrangements/Services Living arrangements for the past 2 months: Assisted Living Facility ([facility]) Lives with:: Facility Resident Patient language and need for interpreter reviewed:: Yes Do you feel safe going back to the place where you live?: Yes      Need for Family Participation in Patient Care: No (Comment) Care giver support system in place?: Yes (comment)   Criminal Activity/Legal Involvement Pertinent to Current Situation/Hospitalization: No - Comment as needed  Activities of Daily Living   ADL Screening  (condition at time of admission) Independently performs ADLs?: Yes (appropriate for developmental age) Is the patient deaf or have difficulty hearing?: No Does the patient have difficulty seeing, even when wearing glasses/contacts?: No Does the patient have difficulty concentrating, remembering, or making decisions?: No  Permission Sought/Granted Permission sought to share information with : Family Supports, Oceanographer granted to share information with : Yes, Verbal Permission Granted  Share Information with NAME: daughter, Evelyn [name] @ [phone]  Permission granted to share info w AGENCY: SNFs        Emotional Assessment Appearance:: Appears stated age Attitude/Demeanor/Rapport: Engaged, Gracious Affect (typically observed): Accepting Orientation: : Oriented to Self, Oriented to  Time, Oriented to Place, Oriented to Situation Alcohol / Substance Use: Not Applicable Psych Involvement: No (comment)  Admission diagnosis:  Hip fracture (HCC) [S72.009A] Periprosthetic fracture of hip, initial encounter [I43.8XXA, Z96.649] Pain of left hip [M25.552] Patient Active Problem List   Diagnosis Date Noted   Closed displaced fracture of lesser trochanter of left femur (HCC) 07/13/2023   Closed fracture of ramus of right pubis (HCC) 12/24/2022   History of bilateral hip arthroplasty 12/23/2022   Lower lobe pneumonia 04/03/2022   Debility 04/03/2022   Pyuria 12/18/2021   Fever 12/18/2021   Macrocytic anemia 12/18/2021   Malnutrition of moderate degree 12/18/2021   Fracture of  femoral neck, left (HCC) 12/17/2021   Bilateral acute nondisplaced hip fracture (HCC) 12/17/2021   Delirium 11/09/2021   Essential hypertension 11/08/2021   Hypokalemia 11/08/2021   Compression fracture of body of thoracic vertebra (HCC) 11/06/2021   Acute anemia 11/06/2021   Cognitive impairment 11/06/2021   Lactic acidosis 11/06/2021   Black stools    Heme + stool    Peptic  ulcer disease    Acute metabolic encephalopathy 11/05/2021   Closed wedge compression fracture of T12 vertebra (HCC) 11/24/2016   Chronic midline low back pain 11/18/2016   Fatigue 11/18/2016   CKD stage 3a, GFR 45-59 ml/min (HCC) 11/18/2016   IBS (irritable bowel syndrome)    UTI (urinary tract infection)    AKI (acute kidney injury) (HCC) 01/30/2015   Routine general medical examination at a health care facility 05/03/2013   Chronic idiopathic constipation 03/31/2012   Other screening mammogram 03/24/2011   Depression 03/06/2010   Insomnia 03/08/2008   HLD (hyperlipidemia) 02/21/2007   PCP:  Patient, No Pcp Per Pharmacy:  No Pharmacies Listed    Social Drivers of Health (SDOH) Social History: SDOH Screenings   Food Insecurity: No Food Insecurity (07/12/2023)  Housing: Low Risk  (07/12/2023)  Transportation Needs: No Transportation Needs (07/12/2023)  Utilities: Not At Risk (07/12/2023)  Alcohol Screen: Low Risk  (09/09/2020)  Depression (PHQ2-9): Low Risk  (03/17/2023)  Financial Resource Strain: Low Risk  (09/09/2020)  Physical Activity: Sufficiently Active (09/09/2020)  Social Connections: Moderately Integrated (07/12/2023)  Stress: No Stress Concern Present (09/09/2020)  Tobacco Use: Low Risk  (07/12/2023)   SDOH Interventions:     Readmission Risk Interventions    07/13/2023    2:03 PM 04/04/2022    1:26 PM  Readmission Risk Prevention Plan  Transportation Screening Complete Complete  PCP or Specialist Appt within 5-7 Days Complete Complete  Home Care Screening Complete Complete  Medication Review (RN CM) Complete Complete

## 2023-07-13 NOTE — Consult Note (Signed)
Reason for Consult:fall with left hip periprothetic fracture of lesser trochanter.  Referring Physician: Dayna Barker MD  Evelyn [name] is an 89 y.o. female.  HPI: 89 year old female assisted-living resident well-known to me from history of 12/17/2021 monopolar hemiarthroplasty on the left for femoral neck fracture.  Patient got up from the couch yesterday to go to the bathroom fell on her left side with acute pain.  No loss of consciousness.  She has impaired vision.  Additional history of depression hyperlipidemia previous falls with pelvic fracture.  Patient has been ambulating with a walker.  Currently her hip pain is severe.  X-rays demonstrated lesser tuberosity fracture without hip location and no femoral prosthesis subsidence.  Greater trochanter is intact.  Past Medical History:  Diagnosis Date   Allergy    Arthritis    Chronic cystitis    Congenital blindness    right eye   Depression    Gastric ulcer    H/O: hematuria    Hepatitis, unspecified    statin related, resolved quickly   Hyperlipidemia    Legally blind in right eye, as defined in [place]    Syncope and collapse 04/01/2014   UTI (lower urinary tract infection)    HISTORY OF UTI    Past Surgical History:  Procedure Laterality Date   ABDOMINAL HYSTERECTOMY     vaginal   BLADDER SUSPENSION     A-P   CATARACT EXTRACTION W/ INTRAOCULAR LENS IMPLANT Left    ESOPHAGOGASTRODUODENOSCOPY     HIP ARTHROPLASTY Left 12/17/2021   Procedure: ARTHROPLASTY BIPOLAR HIP (HEMIARTHROPLASTY);  Surgeon: Eldred Manges, MD;  Location: [facility];  Service: Orthopedics;  Laterality: Left;   OPEN REDUCTION INTERNAL FIXATION (ORIF) DISTAL RADIAL FRACTURE Right 03/09/2016   Procedure: OPEN REDUCTION INTERNAL FIXATION (ORIF) DISTAL RADIAL FRACTURE;  Surgeon: Betha Loa, MD;  Location: [facility];  Service: Orthopedics;  Laterality: Right;    Family History  Problem Relation Age of Onset   Cancer Neg Hx        Colon cancer    Stroke Neg Hx    Heart disease Neg Hx    Hyperlipidemia Neg Hx    Hypertension Neg Hx    Kidney disease Neg Hx    Colon polyps Neg Hx    Colon cancer Neg Hx     Social History:  reports that she has never smoked. She has never used smokeless tobacco. She reports that she does not drink alcohol and does not use drugs.  Allergies: No Known Allergies  Medications: I have reviewed the patient's current medications.  Results for orders placed or performed during the hospital encounter of 07/12/23 (from the past 48 hours)  CBC with Differential     Status: Abnormal   Collection Time: 07/12/23  7:28 AM  Result Value Ref Range   WBC 12.9 (H) 4.0 - 10.5 K/uL   RBC 3.51 (L) 3.87 - 5.11 MIL/uL   Hemoglobin 10.9 (L) 12.0 - 15.0 g/dL   HCT 08.6 (L) 57.8 - 46.9 %   MCV 98.0 80.0 - 100.0 fL   MCH 31.1 26.0 - 34.0 pg   MCHC 31.7 30.0 - 36.0 g/dL   RDW 62.9 52.8 - 41.3 %   Platelets 215 150 - 400 K/uL   nRBC 0.0 0.0 - 0.2 %   Neutrophils Relative % 82 %   Neutro Abs 10.6 (H) 1.7 - 7.7 K/uL   Lymphocytes Relative 9 %   Lymphs Abs 1.1 0.7 - 4.0 K/uL  Monocytes Relative 7 %   Monocytes Absolute 1.0 0.1 - 1.0 K/uL   Eosinophils Relative 1 %   Eosinophils Absolute 0.1 0.0 - 0.5 K/uL   Basophils Relative 0 %   Basophils Absolute 0.1 0.0 - 0.1 K/uL   Immature Granulocytes 1 %   Abs Immature Granulocytes 0.09 (H) 0.00 - 0.07 K/uL    Comment: Performed at [facility], [address], [place], [place] [address]  Basic metabolic panel     Status: Abnormal   Collection Time: 07/12/23  7:28 AM  Result Value Ref Range   Sodium 134 (L) 135 - 145 mmol/L   Potassium 4.0 3.5 - 5.1 mmol/L   Chloride 106 98 - 111 mmol/L   CO2 20 (L) 22 - 32 mmol/L   Glucose, Bld 107 (H) 70 - 99 mg/dL    Comment: Glucose reference range applies only to samples taken after fasting for at least 8 hours.   BUN 30 (H) 8 - 23 mg/dL   Creatinine, Ser 6.21 0.44 - 1.00 mg/dL   Calcium 8.8 (L) 8.9 - 10.3  mg/dL   GFR, Estimated >30 >86 mL/min    Comment: (NOTE) Calculated using the CKD-EPI Creatinine Equation (2021)    Anion gap 8 5 - 15    Comment: Performed at [facility], [address], [place], [place] [address]  Basic metabolic panel     Status: Abnormal   Collection Time: 07/13/23  4:46 AM  Result Value Ref Range   Sodium 136 135 - 145 mmol/L   Potassium 4.4 3.5 - 5.1 mmol/L   Chloride 104 98 - 111 mmol/L   CO2 22 22 - 32 mmol/L   Glucose, Bld 111 (H) 70 - 99 mg/dL    Comment: Glucose reference range applies only to samples taken after fasting for at least 8 hours.   BUN 41 (H) 8 - 23 mg/dL   Creatinine, Ser 9.62 (H) 0.44 - 1.00 mg/dL   Calcium 9.1 8.9 - 95.2 mg/dL   GFR, Estimated 40 (L) >60 mL/min    Comment: (NOTE) Calculated using the CKD-EPI Creatinine Equation (2021)    Anion gap 10 5 - 15    Comment: Performed at [facility], [address] [place], [place] [address]  CBC     Status: Abnormal   Collection Time: 07/13/23  4:46 AM  Result Value Ref Range   WBC 8.3 4.0 - 10.5 K/uL   RBC 3.26 (L) 3.87 - 5.11 MIL/uL   Hemoglobin 10.4 (L) 12.0 - 15.0 g/dL   HCT 44.0 (L) 10.2 - 72.5 %   MCV 100.6 (H) 80.0 - 100.0 fL   MCH 31.9 26.0 - 34.0 pg   MCHC 31.7 30.0 - 36.0 g/dL   RDW 36.6 44.0 - 34.7 %   Platelets 202 150 - 400 K/uL   nRBC 0.0 0.0 - 0.2 %    Comment: Performed at [facility], [address] [place], [place] [address]  Vitamin B12     Status: None   Collection Time: 07/13/23  4:46 AM  Result Value Ref Range   Vitamin B-12 387 180 - 914 pg/mL    Comment: (NOTE) This assay is not validated for testing neonatal or myeloproliferative syndrome specimens for Vitamin B12 levels. Performed at [facility], [address] [place], [place] [address]   Folate     Status: None   Collection Time: 07/13/23  4:46 AM  Result Value  Ref Range   Folate >40.0 >5.9 ng/mL    Comment: RESULT  CONFIRMED BY MANUAL DILUTION Performed at [facility], [address] [place], [place] [address]   Iron and TIBC     Status: Abnormal   Collection Time: 07/13/23  4:46 AM  Result Value Ref Range   Iron 35 28 - 170 ug/dL   TIBC 191 478 - 295 ug/dL   Saturation Ratios 10 (L) 10.4 - 31.8 %   UIBC 335 ug/dL    Comment: Performed at [facility], [address] [place], [place] [address]  Ferritin     Status: None   Collection Time: 07/13/23  4:46 AM  Result Value Ref Range   Ferritin 45 11 - 307 ng/mL    Comment: Performed at [facility], [address], [place], [place] [address]  Reticulocytes     Status: Abnormal   Collection Time: 07/13/23  4:46 AM  Result Value Ref Range   Retic Ct Pct 2.0 0.4 - 3.1 %   RBC. 3.22 (L) 3.87 - 5.11 MIL/uL   Retic Count, Absolute 64.7 19.0 - 186.0 K/uL   Immature Retic Fract 15.5 2.3 - 15.9 %    Comment: Performed at [facility], [address] [place], [place] [address]    DG Hip Lucienne Capers or Wo Pelvis 2-3 Views Left Result Date: 07/12/2023 CLINICAL DATA:  Left hip pain.  Fall injury.  Low back pain. EXAM: DG HIP (WITH OR WITHOUT PELVIS) 2-3V LEFT; CHEST 1 VIEW; LUMBAR SPINE - COMPLETE 4+ VIEW COMPARISON:  CT abdomen pelvis without contrast 04/03/2023, AP chest 12/23/2022, lumbar spine series 12/17/2021, left hip series and AP pelvis 12/23/2022 and CT pelvis 12/23/2022. FINDINGS: Chest AP portable 6:41 a.m.: Moderate-to-large sized hiatal hernia. Stable mediastinum. Aortic atherosclerosis. The cardiac size is normal.  The lungs are emphysematous but clear. There are multiple healed bilateral ribcage fracture deformities and osteopenia. Slight thoracic dextroscoliosis.  No acute osseous findings. Lumbar spine series routine 5 views: There is diffuse osteopenia. There is a moderate chronic wedge compression fracture deformity of T12, mild-to-moderate chronic height loss of L3,  slight wedging of L2. No new or worsening compression fracture is seen. Grade 1 retrolisthesis also is unchanged T12-L1 and L1-L2 and grade 1 anterolisthesis at L2-3 and L5-S1, all believed degenerative. There is mild levoscoliosis as well, with spinous process abutment at L2-5. There is mild lumbar spondylosis. The discs are diffusely degenerative. There is facet hypertrophy moderately from L2-3 down greatest at the lowest 2 levels. AP pelvis and AP and frog-leg left hip: Old left hip replacement. There is acute longitudinal perihardware fracture through the base of the lesser trochanter with medial displacement of the fragment. There is osteopenia with no other visible fractures. Advanced arthrosis noted right hip joint. There are healed fracture deformities of the bilateral pubic rami. Chronic postsurgical changes are again noted in the pelvic floor. Right-sided pelvic phleboliths. IMPRESSION: 1. Acute longitudinal perihardware fracture through the base of the lesser trochanter of the left femur with medial displacement of the fragment. 2. Osteopenia and degenerative change without evidence of acute lumbar spine fractures. 3. Chronic compression fracture deformities of T12, L3, and L2. 4. Mild thoracic dextroscoliosis and mild lumbar levoscoliosis. 5. Moderate-to-large sized hiatal hernia. 6. Emphysema. 7. Aortic atherosclerosis. 8. Healed fracture deformities of the bilateral pubic rami. Electronically Signed   By: Almira Bar M.D.   On: 07/12/2023 07:54   DG Lumbar Spine Complete Result  Date: 07/12/2023 CLINICAL DATA:  Left hip pain.  Fall injury.  Low back pain. EXAM: DG HIP (WITH OR WITHOUT PELVIS) 2-3V LEFT; CHEST 1 VIEW; LUMBAR SPINE - COMPLETE 4+ VIEW COMPARISON:  CT abdomen pelvis without contrast 04/03/2023, AP chest 12/23/2022, lumbar spine series 12/17/2021, left hip series and AP pelvis 12/23/2022 and CT pelvis 12/23/2022. FINDINGS: Chest AP portable 6:41 a.m.: Moderate-to-large sized hiatal  hernia. Stable mediastinum. Aortic atherosclerosis. The cardiac size is normal.  The lungs are emphysematous but clear. There are multiple healed bilateral ribcage fracture deformities and osteopenia. Slight thoracic dextroscoliosis.  No acute osseous findings. Lumbar spine series routine 5 views: There is diffuse osteopenia. There is a moderate chronic wedge compression fracture deformity of T12, mild-to-moderate chronic height loss of L3, slight wedging of L2. No new or worsening compression fracture is seen. Grade 1 retrolisthesis also is unchanged T12-L1 and L1-L2 and grade 1 anterolisthesis at L2-3 and L5-S1, all believed degenerative. There is mild levoscoliosis as well, with spinous process abutment at L2-5. There is mild lumbar spondylosis. The discs are diffusely degenerative. There is facet hypertrophy moderately from L2-3 down greatest at the lowest 2 levels. AP pelvis and AP and frog-leg left hip: Old left hip replacement. There is acute longitudinal perihardware fracture through the base of the lesser trochanter with medial displacement of the fragment. There is osteopenia with no other visible fractures. Advanced arthrosis noted right hip joint. There are healed fracture deformities of the bilateral pubic rami. Chronic postsurgical changes are again noted in the pelvic floor. Right-sided pelvic phleboliths. IMPRESSION: 1. Acute longitudinal perihardware fracture through the base of the lesser trochanter of the left femur with medial displacement of the fragment. 2. Osteopenia and degenerative change without evidence of acute lumbar spine fractures. 3. Chronic compression fracture deformities of T12, L3, and L2. 4. Mild thoracic dextroscoliosis and mild lumbar levoscoliosis. 5. Moderate-to-large sized hiatal hernia. 6. Emphysema. 7. Aortic atherosclerosis. 8. Healed fracture deformities of the bilateral pubic rami. Electronically Signed   By: Almira Bar M.D.   On: 07/12/2023 07:54   DG Chest 1  View Result Date: 07/12/2023 CLINICAL DATA:  Left hip pain.  Fall injury.  Low back pain. EXAM: DG HIP (WITH OR WITHOUT PELVIS) 2-3V LEFT; CHEST 1 VIEW; LUMBAR SPINE - COMPLETE 4+ VIEW COMPARISON:  CT abdomen pelvis without contrast 04/03/2023, AP chest 12/23/2022, lumbar spine series 12/17/2021, left hip series and AP pelvis 12/23/2022 and CT pelvis 12/23/2022. FINDINGS: Chest AP portable 6:41 a.m.: Moderate-to-large sized hiatal hernia. Stable mediastinum. Aortic atherosclerosis. The cardiac size is normal.  The lungs are emphysematous but clear. There are multiple healed bilateral ribcage fracture deformities and osteopenia. Slight thoracic dextroscoliosis.  No acute osseous findings. Lumbar spine series routine 5 views: There is diffuse osteopenia. There is a moderate chronic wedge compression fracture deformity of T12, mild-to-moderate chronic height loss of L3, slight wedging of L2. No new or worsening compression fracture is seen. Grade 1 retrolisthesis also is unchanged T12-L1 and L1-L2 and grade 1 anterolisthesis at L2-3 and L5-S1, all believed degenerative. There is mild levoscoliosis as well, with spinous process abutment at L2-5. There is mild lumbar spondylosis. The discs are diffusely degenerative. There is facet hypertrophy moderately from L2-3 down greatest at the lowest 2 levels. AP pelvis and AP and frog-leg left hip: Old left hip replacement. There is acute longitudinal perihardware fracture through the base of the lesser trochanter with medial displacement of the fragment. There is osteopenia with no other visible fractures. Advanced arthrosis  noted right hip joint. There are healed fracture deformities of the bilateral pubic rami. Chronic postsurgical changes are again noted in the pelvic floor. Right-sided pelvic phleboliths. IMPRESSION: 1. Acute longitudinal perihardware fracture through the base of the lesser trochanter of the left femur with medial displacement of the fragment. 2.  Osteopenia and degenerative change without evidence of acute lumbar spine fractures. 3. Chronic compression fracture deformities of T12, L3, and L2. 4. Mild thoracic dextroscoliosis and mild lumbar levoscoliosis. 5. Moderate-to-large sized hiatal hernia. 6. Emphysema. 7. Aortic atherosclerosis. 8. Healed fracture deformities of the bilateral pubic rami. Electronically Signed   By: Almira Bar M.D.   On: 07/12/2023 07:54    ROS 14 point system update unchanged.  No loss of consciousness.  Positive for decreased visual acuity legally blind.  History of falls previous left hip monopolar hemiarthroplasty for femoral neck fracture 2023. Blood pressure (!) 168/59, pulse 75, temperature 98.6 F (37 C), temperature source Oral, resp. rate 16, height 5\' 3"  (1.6 m), weight 47.8 kg, SpO2 94%. Physical Exam Constitutional:      Appearance: Normal appearance.     Comments: Some decreased memory may be related to medication  HENT:     Head: Normocephalic.     Nose: Nose normal.  Eyes:     Comments: Decreased vision right eye legally blind  Cardiovascular:     Rate and Rhythm: Normal rate.  Pulmonary:     Effort: Pulmonary effort is normal.  Neurological:     Mental Status: She is alert.  Psychiatric:        Mood and Affect: Mood normal.     Comments: Oriented times 3.  Patient unaware of what assisted living she came from.     Assessment/Plan: Patient with periprosthetic fracture lesser trochanter.  Prosthesis has not subsided and hip is reduced.  If she develops instability or the prosthesis subsides then surgery may be necessary.  Plan mobilization with physical therapy with walker and 50% weightbearing.  I will continue to follow her recheck check her Friday and plan to be follow-up in the office.  When she can mobilize safely bed to chair then skilled facility short-term for 20 days and then return to assisted living would be likely plan.    Eldred Manges 07/13/2023, 10:23 AM

## 2023-07-13 NOTE — Evaluation (Signed)
Occupational Therapy Evaluation Patient Details Name: Evelyn [name] MRN: [id number] DOB: January 02, 1925 Today's Date: 07/13/2023   History of Present Illness 89 yo female presents to therapy following mechanical fall in ALF on 07/12/2023 resulting in a L periprosthetic femur fx. Orthopedics consulted and recommend pain and conservative management with pt LLE 50% WB with use of RW. Pt PMH includes but is not limited to: falls including 12/2022 sacrum and pubic rami fx, depression, allergies, arthritis, blind R eye, HLD, L THA (2023) and R ORIF of radius.   Clinical Impression   Pt admitted with the above diagnosis and has the deficits outlined below. Pt would benefit from cont OT to increase independence in basic adls and adl transfers so she can return to her ALF.  Unsure what assistance level this ALF offers. Pt will need assist with bathing, dressing and all mobility therefore may need less than 3 hours a day of therapy prior to returning to ALF if they can no assist her at her current level.  Pt was fairly independent with basic adls outside of bathing PTA using a rollator. Pt went to dining room for meals with her rollator and no assist.  Pt able to tolerate standing to day with no BP drop and transferred to chair with mod assist and walker maintaining PWB. Will continue to follow with focus on adl and adl transfers.      If plan is discharge home, recommend the following: A lot of help with walking and/or transfers;A lot of help with bathing/dressing/bathroom;Assistance with cooking/housework;Direct supervision/assist for medications management;Direct supervision/assist for financial management;Assist for transportation;Supervision due to cognitive status    Functional Status Assessment  Patient has had a recent decline in their functional status and demonstrates the ability to make significant improvements in function in a reasonable and predictable amount of time.  Equipment Recommendations   None recommended by OT    Recommendations for Other Services       Precautions / Restrictions Precautions Precautions: Fall Restrictions Weight Bearing Restrictions Per Provider Order: Yes LLE Weight Bearing Per Provider Order: Partial weight bearing LLE Partial Weight Bearing Percentage or Pounds: 50      Mobility Bed Mobility Overal bed mobility: Needs Assistance Bed Mobility: Supine to Sit     Supine to sit: Mod assist, HOB elevated     General bed mobility comments: Pt came to EOB with pain but wanting to do for herself. Pt scooted to EOB with min assist using pad on first attempt and without assist second attempt.    Transfers Overall transfer level: Needs assistance Equipment used: Rolling walker (2 wheels) Transfers: Sit to/from Stand, Bed to chair/wheelchair/BSC Sit to Stand: Min assist     Step pivot transfers: Min assist, From elevated surface (cues for 50% WB)     General transfer comment: Pt pushed up on walker with both hands but is accustomed to using a rollator.      Balance Overall balance assessment: History of Falls, Needs assistance Sitting-balance support: Feet supported Sitting balance-Leahy Scale: Fair     Standing balance support: Reliant on assistive device for balance, During functional activity Standing balance-Leahy Scale: Poor Standing balance comment: Pt must have outside assist to stand and is PWB LLE                           ADL either performed or assessed with clinical judgement   ADL Overall ADL's : Needs assistance/impaired Eating/Feeding: Set up;Sitting  Grooming: Wash/dry hands;Wash/dry face;Oral care;Set up;Sitting   Upper Body Bathing: Moderate assistance;Sitting   Lower Body Bathing: Maximal assistance;Sit to/from stand   Upper Body Dressing : Minimal assistance;Sitting   Lower Body Dressing: Maximal assistance;Sit to/from stand   Toilet Transfer: Minimal assistance;Stand-pivot;BSC/3in1;Rolling  walker (2 wheels) Toilet Transfer Details (indicate cue type and reason): stand pivot to [facility] Toileting- Clothing Manipulation and Hygiene: Moderate assistance;Sit to/from stand;Cueing for compensatory techniques       Functional mobility during ADLs: Moderate assistance;Rolling walker (2 wheels) General ADL Comments: Pt very motivated to be independent. Pt limited by pain in L hip but did seem to understand PWB status and participated well.     Vision Baseline Vision/History: 1 Wears glasses (Does not see out of R eye at all.) Ability to See in Adequate Light: 0 Adequate (out of L eye) Patient Visual Report: No change from baseline Vision Assessment?: No apparent visual deficits (outside of being blind in R eye)     Perception         Praxis Praxis: [facility]       Pertinent Vitals/Pain Pain Assessment Pain Assessment: 0-10 Pain Score: 8  Pain Location: L LE and peritoneal Pain Descriptors / Indicators: Aching, Burning, Constant, Discomfort, Dull, Grimacing, Guarding Pain Intervention(s): Limited activity within patient's tolerance, Monitored during session, Premedicated before session, Repositioned     Extremity/Trunk Assessment Upper Extremity Assessment Upper Extremity Assessment: Overall WFL for tasks assessed   Lower Extremity Assessment Lower Extremity Assessment: Defer to PT evaluation   Cervical / Trunk Assessment Cervical / Trunk Assessment: Kyphotic   Communication Communication Communication: No apparent difficulties   Cognition Arousal: Alert Behavior During Therapy: WFL for tasks assessed/performed Overall Cognitive Status: History of cognitive impairments - at baseline                                 General Comments: Pt with general orientation deficits to date and time that pt is aware of.     General Comments  Pt most limited by pain and 50% WB in LLE.  Pt very motivated and wants to be independent.    Exercises     Shoulder  Instructions      Home Living Family/patient expects to be discharged to:: Assisted living                             Home Equipment: Rollator (4 wheels);Shower seat          Prior Functioning/Environment Prior Level of Function : Needs assist       Physical Assist : ADLs (physical)   ADLs (physical): IADLs;Bathing Mobility Comments: facility mobility with rollator ADLs Comments: pt reports staff assists with showers        OT Problem List: Decreased activity tolerance;Impaired balance (sitting and/or standing);Decreased knowledge of use of DME or AE;Pain      OT Treatment/Interventions: Self-care/ADL training;Therapeutic activities;DME and/or AE instruction;Balance training    OT Goals(Current goals can be found in the care plan section) Acute Rehab OT Goals Patient Stated Goal: to try to get back to [facility] OT Goal Formulation: With patient Time For Goal Achievement: 07/27/23 Potential to Achieve Goals: Fair ADL Goals Pt Will Perform Upper Body Dressing: with supervision Pt Will Perform Lower Body Dressing: with min assist;sit to/from stand Pt Will Transfer to Toilet: with contact guard assist;ambulating;grab bars Pt Will Perform Toileting - Clothing  Manipulation and hygiene: sit to/from stand;with contact guard assist  OT Frequency: Min 1X/week    Co-evaluation              AM-PAC OT "6 Clicks" Daily Activity     Outcome Measure Help from another person eating meals?: A Little Help from another person taking care of personal grooming?: A Little Help from another person toileting, which includes using toliet, bedpan, or urinal?: A Lot Help from another person bathing (including washing, rinsing, drying)?: A Lot Help from another person to put on and taking off regular upper body clothing?: A Little Help from another person to put on and taking off regular lower body clothing?: A Lot 6 Click Score: 15   End of Session Equipment Utilized  During Treatment: Rolling walker (2 wheels) Nurse Communication: Mobility status  Activity Tolerance: Patient limited by pain Patient left: in chair;with call bell/phone within reach;with chair alarm set  OT Visit Diagnosis: Unsteadiness on feet (R26.81);Other abnormalities of gait and mobility (R26.89)                Time: 1610-9604 OT Time Calculation (min): 29 min Charges:  OT General Charges $OT Visit: 1 Visit OT Evaluation $OT Eval Moderate Complexity: 1 Mod OT Treatments $Self Care/Home Management : 8-22 mins  Hope Budds 07/13/2023, 9:46 AM

## 2023-07-14 DIAGNOSIS — S72009D Fracture of unspecified part of neck of unspecified femur, subsequent encounter for closed fracture with routine healing: Secondary | ICD-10-CM | POA: Diagnosis not present

## 2023-07-14 LAB — BASIC METABOLIC PANEL
Anion gap: 11 (ref 5–15)
BUN: 38 mg/dL — ABNORMAL HIGH (ref 8–23)
CO2: 18 mmol/L — ABNORMAL LOW (ref 22–32)
Calcium: 8.8 mg/dL — ABNORMAL LOW (ref 8.9–10.3)
Chloride: 105 mmol/L (ref 98–111)
Creatinine, Ser: 1.33 mg/dL — ABNORMAL HIGH (ref 0.44–1.00)
GFR, Estimated: 37 mL/min — ABNORMAL LOW (ref >60)
Glucose, Bld: 108 mg/dL — ABNORMAL HIGH (ref 70–99)
Potassium: 4.4 mmol/L (ref 3.5–5.1)
Sodium: 134 mmol/L — ABNORMAL LOW (ref 135–145)

## 2023-07-14 MED ORDER — SODIUM CHLORIDE 0.9 % IV SOLN: INTRAVENOUS | Status: AC

## 2023-07-14 NOTE — Progress Notes (Signed)
PROGRESS NOTE Evelyn [name]  ZOX:[id number] DOB: Mar 28, 1925 DOA: 07/12/2023 PCP: Patient, No Pcp Per  Brief Narrative/Hospital Course: 89 y.o. from assisted living facility female with medical history significant for depression, hyperlipidemia and prior left hip fracture and arthroplasty, traumatic pelvic fracture presented after a fall on the way to the bathroom onto her left side.  Denies hitting her head, losing consciousness, or any recent illness or other complaints. In the ED afebrile vitals fairly stable although BP initially high.  Labs with mild hyponatremia BUN 38 stable creatinine bicarb 22 leukocytosis 12.9 hemoglobin anemic 10.9 g. Underwent imaging with x-ray chest, lumbar spine hip> moderate to large hiatal hernia, emphysema osteopenia diffuse moderate chronic wedge compression fracture T12 L3 L2, old left hip replacement noted, acute periprosthetic fracture at the base of the lesser trochanter of the left femur Dr. Ophelia Charter of orthopedic surger consulted and admitted for further management.   Subjective: Patient resting comfortably reports she needs to get out of the hospital  Overnight afebrile BP stable Labs with creatinine slightly trending up Leukocytosis has resolved.    Assessment and Plan: Principal Problem:   Bilateral acute nondisplaced hip fracture (HCC) Active Problems:   Closed displaced fracture of lesser trochanter of left femur (HCC)   Periprosthetic fracture of lesser trochanter  Mechanical fall Increased risk for dislocation of her hip: S/P reduction of hip. Previous left hip hemiarthroplasty.  Orthopedics consulted -if she develops instability of the prosthesis subsides then surgery may be necessary, plan is to mobilize with PT and walker and 50% weightbearing. Cont pain control, planning for skilled nursing facility  AKI creatinine slightly trending up, continue on IV fluids, encourage oral intake Recent Labs    12/23/22 1459 12/24/22 0323 12/25/22 0135  12/28/22 0433 03/21/23 0951 07/12/23 0728 07/13/23 0446 07/14/23 0440  BUN 32* 27* 21 24* 18 30* 41* 38*  CREATININE 1.29* 1.15* 0.95 1.01* 0.94 0.82 1.23* 1.33*  CO2 18* 17* 19* 20* 22 20* 22 18*  K 4.1 3.9 3.9 3.6 4.5 4.0 4.4 4.4     Leukocytosis: ?reactive on admission.Resolved.   Macrocytic anemia:appears chronic checked anemia panel-stable. Recent Labs    12/25/22 0135 12/28/22 0433 03/21/23 0951 07/12/23 0728 07/13/23 0446  HGB 10.1* 11.3* 13.2 10.9* 10.4*  MCV 95.8 94.3 98.6 98.0 100.6*  VITAMINB12  --   --   --   --  387  FOLATE  --   --   --   --  >40.0  FERRITIN  --   --   --   --  45  TIBC  --   --   --   --  370  IRON  --   --   --   --  35  RETICCTPCT  --   --   --   --  2.0    Anxiety: Mood stable well, continue home BuSpar, Prozac Hiatal hernia moderate to large: Continue PPI Chronic wedge compression fracture: Chronic.  Cont Pain management. Emphysema of lung: Asymptomatic.add IS. Diffuse osteopenia: resume vitamin D GERD: cont PPI Mild memory issues/forgetful at baseline.Continue supportive care, delerium precautions.   DVT prophylaxis: enoxaparin (LOVENOX) injection 30 mg Start: 07/13/23 1000 SCDs Start: 07/12/23 0946 Code Status:   Code Status: Limited: Do not attempt resuscitation (DNR) -DNR-LIMITED -Do Not Intubate/DNI  Family Communication: plan of care discussed with patient/none at bedside. Called her daughter Lisa-updated. Patient status is: Remains hospitalized because of severity of illness Level of care: Med-Surg   Dispo: The patient is from:  ALF.  Daughter lives close by            Anticipated disposition: Pending PT OT evaluation .  Anticipate SNF  Objective: Vitals last 24 hrs: Vitals:   07/13/23 0633 07/13/23 1336 07/13/23 2231 07/14/23 0658  BP: (!) 168/59 (!) 118/54 (!) 142/58 (!) 149/119  Pulse: 75 86 88 82  Resp: 16 18 18 19   Temp: 98.6 F (37 C) 97.8 F (36.6 C) 99.3 F (37.4 C) 98.8 F (37.1 C)  TempSrc: Oral Oral Oral  Oral  SpO2: 94% 94% 90%   Weight:      Height:      Weight change:   Physical Examination: General exam: alert awake, HEENT:Oral mucosa moist, Ear/Nose WNL grossly Respiratory system: Bilaterally clear BS,no use of accessory muscle Cardiovascular system: S1 & S2 +, No JVD. Gastrointestinal system: Abdomen soft,NT,ND, BS+ Nervous System: Alert, awake, moving all extremities,and following commands. Extremities: LE edema neg,distal peripheral pulses palpable and warm.  Skin: No rashes,no icterus. MSK: Normal muscle bulk,tone, power  Medications reviewed:  Scheduled Meds:  aspirin EC  81 mg Oral Daily   busPIRone  5 mg Oral BID   cholecalciferol  1,000 Units Oral Daily   docusate sodium  100 mg Oral BID   enoxaparin (LOVENOX) injection  30 mg Subcutaneous Q24H   FLUoxetine  40 mg Oral Daily   pantoprazole  40 mg Oral q AM   zolpidem  5 mg Oral QHS  Continuous Infusions:  sodium chloride      Diet Order             Diet regular Room service appropriate? Yes; Fluid consistency: Thin  Diet effective now                  Intake/Output Summary (Last 24 hours) at 07/14/2023 1036 Last data filed at 07/14/2023 1027 Gross per 24 hour  Intake 909.5 ml  Output 0 ml  Net 909.5 ml  Net IO Since Admission: 1,179.5 mL [07/14/23 1036]  Wt Readings from Last 3 Encounters:  07/12/23 47.8 kg  03/17/23 48.4 kg  12/28/22 48.5 kg    Unresulted Labs (From admission, onward)     Start     Ordered   07/13/23 1033  Urinalysis, Routine w reflex microscopic -Urine, Clean Catch  Once,   R       Question:  Specimen Source  Answer:  Urine, Clean Catch   07/13/23 1032          Data Reviewed: I have personally reviewed following labs and imaging studies CBC: Recent Labs  Lab 07/12/23 0728 07/13/23 0446  WBC 12.9* 8.3  NEUTROABS 10.6*  --   HGB 10.9* 10.4*  HCT 34.4* 32.8*  MCV 98.0 100.6*  PLT 215 202   Basic Metabolic Panel:  Recent Labs  Lab 07/12/23 0728 07/13/23 0446  07/14/23 0440  NA 134* 136 134*  K 4.0 4.4 4.4  CL 106 104 105  CO2 20* 22 18*  GLUCOSE 107* 111* 108*  BUN 30* 41* 38*  CREATININE 0.82 1.23* 1.33*  CALCIUM 8.8* 9.1 8.8*   GFR: Estimated Creatinine Clearance: 18.7 mL/min (A) (by C-G formula based on SCr of 1.33 mg/dL (H)). No results found for this or any previous visit (from the past 240 hours).   Antimicrobials/Microbiology: Anti-infectives (From admission, onward)    None         Component Value Date/Time   SDES URINE, RANDOM 12/24/2022 0530   SPECREQUEST NONE Reflexed from [id number]  12/24/2022 0530   CULT  12/24/2022 0530    NO GROWTH Performed at [facility], [address], [place], [place] [address]    REPTSTATUS 12/25/2022 FINAL 12/24/2022 0530  Radiology Studies: No results found.  LOS: 1 day   Total time spent in review of labs and imaging, patient evaluation, formulation of plan, documentation and communication with family: 35 minutes  Lanae Boast, MD  [facility]  07/14/2023, 10:36 AM

## 2023-07-15 DIAGNOSIS — S72009D Fracture of unspecified part of neck of unspecified femur, subsequent encounter for closed fracture with routine healing: Secondary | ICD-10-CM | POA: Diagnosis not present

## 2023-07-15 LAB — URINALYSIS, ROUTINE W REFLEX MICROSCOPIC
Bacteria, UA: NONE SEEN
Bilirubin Urine: NEGATIVE
Glucose, UA: NEGATIVE mg/dL
Ketones, ur: 5 mg/dL — AB
Nitrite: NEGATIVE
Protein, ur: 30 mg/dL — AB
Specific Gravity, Urine: 1.013 (ref 1.005–1.030)
WBC, UA: >50 WBC/hpf (ref 0–5)
pH: 8 (ref 5.0–8.0)

## 2023-07-15 LAB — BASIC METABOLIC PANEL
Anion gap: 8 (ref 5–15)
BUN: 29 mg/dL — ABNORMAL HIGH (ref 8–23)
CO2: 19 mmol/L — ABNORMAL LOW (ref 22–32)
Calcium: 8.2 mg/dL — ABNORMAL LOW (ref 8.9–10.3)
Chloride: 107 mmol/L (ref 98–111)
Creatinine, Ser: 0.87 mg/dL (ref 0.44–1.00)
GFR, Estimated: >60 mL/min (ref >60)
Glucose, Bld: 123 mg/dL — ABNORMAL HIGH (ref 70–99)
Potassium: 3.8 mmol/L (ref 3.5–5.1)
Sodium: 134 mmol/L — ABNORMAL LOW (ref 135–145)

## 2023-07-15 NOTE — Progress Notes (Signed)
PROGRESS NOTE Evelyn [name]  GNF:[id number] DOB: 02/09/1925 DOA: 07/12/2023 PCP: Patient, No Pcp Per  Brief Narrative/Hospital Course: 89 y.o. from assisted living facility female with medical history significant for depression, hyperlipidemia and prior left hip fracture and arthroplasty, traumatic pelvic fracture presented after a fall on the way to the bathroom onto her left side.  Denies hitting her head, losing consciousness, or any recent illness or other complaints. In the ED afebrile vitals fairly stable although BP initially high.  Labs with mild hyponatremia BUN 38 stable creatinine bicarb 22 leukocytosis 12.9 hemoglobin anemic 10.9 g. Underwent imaging with x-ray chest, lumbar spine hip> moderate to large hiatal hernia, emphysema osteopenia diffuse moderate chronic wedge compression fracture T12 L3 L2, old left hip replacement noted, acute periprosthetic fracture at the base of the lesser trochanter of the left femur Dr. Ophelia Charter of orthopedic surger consulted and admitted for further management.  Subjective: Seen and examined.  Pleasantly confused no dysuria no pain Overnight afebrile Last BM 1/20 [place] for placement Labs shows AKI resolved with ivf  Assessment and Plan: Principal Problem:   Bilateral acute nondisplaced hip fracture (HCC) Active Problems:   Closed displaced fracture of lesser trochanter of left femur (HCC)   Periprosthetic fracture of lesser trochanter  Mechanical fall Increased risk for dislocation of her hip: S/P reduction of hip. Previous left hip hemiarthroplasty.  Orthopedics consulted -if she develops instability of the prosthesis subsides then surgery may be necessary, plan is to mobilize with PT and walker and 50% weightbearing.  Overall doing well.  At this time waiting for skilled nursing facility.    AKI is resolved with IV fluids encourage oral intake  Recent Labs    12/23/22 1459 12/24/22 0323 12/25/22 0135 12/28/22 0433 03/21/23 0951  07/12/23 0728 07/13/23 0446 07/14/23 0440 07/15/23 0432  BUN 32* 27* 21 24* 18 30* 41* 38* 29*  CREATININE 1.29* 1.15* 0.95 1.01* 0.94 0.82 1.23* 1.33* 0.87  CO2 18* 17* 19* 20* 22 20* 22 18* 19*  K 4.1 3.9 3.9 3.6 4.5 4.0 4.4 4.4 3.8     Leukocytosis: ?reactive on admission.Resolved.   Macrocytic anemia:appears chronic checked anemia panel-stable. Recent Labs    12/25/22 0135 12/28/22 0433 03/21/23 0951 07/12/23 0728 07/13/23 0446  HGB 10.1* 11.3* 13.2 10.9* 10.4*  MCV 95.8 94.3 98.6 98.0 100.6*  VITAMINB12  --   --   --   --  387  FOLATE  --   --   --   --  >40.0  FERRITIN  --   --   --   --  45  TIBC  --   --   --   --  370  IRON  --   --   --   --  35  RETICCTPCT  --   --   --   --  2.0    Anxiety: Mood stable.  Continue home BuSpar, Prozac Hiatal hernia moderate to large: Continue PPI Chronic wedge compression fracture: Chronic.  Cont Pain management. Emphysema of lung: Asymptomatic.add IS. Diffuse osteopenia: resume vitamin D GERD: cont PPI Mild memory issues/forgetful at baseline.Continue supportive care, delerium precautions.   DVT prophylaxis: enoxaparin (LOVENOX) injection 30 mg Start: 07/13/23 1000 SCDs Start: 07/12/23 0946 Code Status:   Code Status: Limited: Do not attempt resuscitation (DNR) -DNR-LIMITED -Do Not Intubate/DNI  Family Communication: plan of care discussed with patient/none at bedside. 1/23 Called her daughter Evelyn [name]-updated. Patient status is: Remains hospitalized because of severity of illness Level of care: Med-Surg  Dispo: The patient is from: ALF.  Daughter lives close by            Anticipated disposition: Medically stable, awaiting SNF  Objective: Vitals last 24 hrs: Vitals:   07/14/23 0658 07/14/23 1319 07/14/23 2027 07/15/23 0508  BP: (!) 149/119 (!) 117/52 (!) 175/74 (!) 162/63  Pulse: 82 86 81 85  Resp: 19 15 18 18   Temp: 98.8 F (37.1 C) 98.2 F (36.8 C) 98.3 F (36.8 C) 98.8 F (37.1 C)  TempSrc: Oral Oral Oral Oral   SpO2:  98% 93% 95%  Weight:      Height:      Weight change:   Physical Examination: General exam: alert awake, oriented to self HEENT:Oral mucosa moist, Ear/Nose WNL grossly Respiratory system: Bilaterally clear BS,no use of accessory muscle Cardiovascular system: S1 & S2 +, No JVD. Gastrointestinal system: Abdomen soft,NT,ND, BS+ Nervous System: Alert, awake, moving all extremities,and following commands. Extremities: LE edema neg,distal peripheral pulses palpable and warm.  Skin: No rashes,no icterus. MSK: Normal muscle bulk,tone, power   Medications reviewed:  Scheduled Meds:  aspirin EC  81 mg Oral Daily   busPIRone  5 mg Oral BID   cholecalciferol  1,000 Units Oral Daily   docusate sodium  100 mg Oral BID   enoxaparin (LOVENOX) injection  30 mg Subcutaneous Q24H   FLUoxetine  40 mg Oral Daily   pantoprazole  40 mg Oral q AM   zolpidem  5 mg Oral QHS  Continuous Infusions:    Diet Order             Diet regular Room service appropriate? Yes; Fluid consistency: Thin  Diet effective now                  Intake/Output Summary (Last 24 hours) at 07/15/2023 0850 Last data filed at 07/15/2023 0654 Gross per 24 hour  Intake 3331.47 ml  Output 1200 ml  Net 2131.47 ml  Net IO Since Admission: 2,950.97 mL [07/15/23 0850]  Wt Readings from Last 3 Encounters:  07/12/23 47.8 kg  03/17/23 48.4 kg  12/28/22 48.5 kg    Unresulted Labs (From admission, onward)     Start     Ordered   07/15/23 0500  Basic metabolic panel  Daily,   R      07/14/23 1040   07/13/23 1033  Urinalysis, Routine w reflex microscopic -Urine, Clean Catch  Once,   R       Question:  Specimen Source  Answer:  Urine, Clean Catch   07/13/23 1032          Data Reviewed: I have personally reviewed following labs and imaging studies CBC: Recent Labs  Lab 07/12/23 0728 07/13/23 0446  WBC 12.9* 8.3  NEUTROABS 10.6*  --   HGB 10.9* 10.4*  HCT 34.4* 32.8*  MCV 98.0 100.6*  PLT 215 202    Basic Metabolic Panel:  Recent Labs  Lab 07/12/23 0728 07/13/23 0446 07/14/23 0440 07/15/23 0432  NA 134* 136 134* 134*  K 4.0 4.4 4.4 3.8  CL 106 104 105 107  CO2 20* 22 18* 19*  GLUCOSE 107* 111* 108* 123*  BUN 30* 41* 38* 29*  CREATININE 0.82 1.23* 1.33* 0.87  CALCIUM 8.8* 9.1 8.8* 8.2*   GFR: Estimated Creatinine Clearance: 28.5 mL/min (by C-G formula based on SCr of 0.87 mg/dL). No results found for this or any previous visit (from the past 240 hours).   Antimicrobials/Microbiology: Anti-infectives (From admission, onward)  None         Component Value Date/Time   SDES URINE, RANDOM 12/24/2022 0530   SPECREQUEST NONE Reflexed from [id number] 12/24/2022 0530   CULT  12/24/2022 0530    NO GROWTH Performed at [facility], [address], [place], [place] [address]    REPTSTATUS 12/25/2022 FINAL 12/24/2022 0530  Radiology Studies: No results found.  LOS: 2 days   Total time spent in review of labs and imaging, patient evaluation, formulation of plan, documentation and communication with family: 35 minutes  Lanae Boast, MD  [facility]  07/15/2023, 8:50 AM

## 2023-07-15 NOTE — TOC Progression Note (Signed)
Transition of Care [facility]) - Progression Note    Patient Details  Name: Evelyn [name] MRN: [id number] Date of Birth: February 26, 1925  Transition of Care [facility]) CM/SW Contact  Amada Jupiter, LCSW Phone Number: 07/15/2023, 2:50 PM  Clinical Narrative:     Have received SNF bed offer from [facility] and daughter has accepted.  Facility can admit pt on Monday.   Expected Discharge Plan: Skilled Nursing Facility Barriers to Discharge: Continued Medical Work up, SNF Pending bed offer  Expected Discharge Plan and Services In-house Referral: Clinical Social Work   Post Acute Care Choice: Skilled Nursing Facility Living arrangements for the past 2 months: Assisted Living Facility ([facility])                 DME Arranged: N/A                     Social Determinants of Health (SDOH) Interventions SDOH Screenings   Food Insecurity: No Food Insecurity (07/12/2023)  Housing: Low Risk  (07/12/2023)  Transportation Needs: No Transportation Needs (07/12/2023)  Utilities: Not At Risk (07/12/2023)  Alcohol Screen: Low Risk  (09/09/2020)  Depression (PHQ2-9): Low Risk  (03/17/2023)  Financial Resource Strain: Low Risk  (09/09/2020)  Physical Activity: Sufficiently Active (09/09/2020)  Social Connections: Moderately Integrated (07/12/2023)  Stress: No Stress Concern Present (09/09/2020)  Tobacco Use: Low Risk  (07/12/2023)    Readmission Risk Interventions    07/13/2023    2:03 PM 04/04/2022    1:26 PM  Readmission Risk Prevention Plan  Transportation Screening Complete Complete  PCP or Specialist Appt within 5-7 Days Complete Complete  Home Care Screening Complete Complete  Medication Review (RN CM) Complete Complete

## 2023-07-15 NOTE — Plan of Care (Signed)
  Problem: Coping: Goal: Level of anxiety will decrease Outcome: Progressing   Problem: Education: Goal: Knowledge of General Education information will improve Description: Including pain rating scale, medication(s)/side effects and non-pharmacologic comfort measures Outcome: Not Progressing   Problem: Health Behavior/Discharge Planning: Goal: Ability to manage health-related needs will improve Outcome: Not Progressing   Problem: Clinical Measurements: Goal: Cardiovascular complication will be avoided Outcome: Not Progressing   Problem: Activity: Goal: Risk for activity intolerance will decrease Outcome: Not Progressing   Problem: Nutrition: Goal: Adequate nutrition will be maintained Outcome: Not Progressing   Problem: Elimination: Goal: Will not experience complications related to bowel motility Outcome: Not Progressing

## 2023-07-15 NOTE — Progress Notes (Signed)
Physical Therapy Treatment Patient Details Name: Evelyn [name] MRN: [id number] DOB: [birth year] Today's Date: 07/15/2023   History of Present Illness 89 yo female presents to therapy following mechanical fall in ALF on 07/12/2023 resulting in a L periprosthetic femur fx. Orthopedics consulted and recommend pain and conservative management with pt LLE 50% WB with use of RW. Pt PMH includes but is not limited to: falls including 12/2022 sacrum and pubic rami fx, depression, allergies, arthritis, blind R eye, HLD, L THA (2023) and R ORIF of radius.    PT Comments  Pt confused this date, unaware she is in hospital, speaking to friends and daughter though they are not in room, needing reorientation to being in hospital, LLE WB restrictions, redirected to task at hand often. Pt able to follow commands with increased time and cues. Pt's daughter in room reports pt has had hospital delirium before and pt appears to be experiencing it currently. Pt needing significantly increased time and assist to come to sitting EOB, able to inch LE slowly but ultimately needing assist with legs and trunk. Once sitting EOB, pt able to perform 2 STS reps, mod A from EOB and appears to respect LLE 50% weigthbearing with weight shifted to R side. Due to pt's confusion, +2 for safety with step pivot to recliner to ensure WB restrictions respected. Pt needing assist to maneuver RW appropriately and safely. Pt without complaints throughout session, VSS, reports 4/10 pain at fracture site. Continue to recommend continued inpatient follow up therapy, <3 hours/day.     If plan is discharge home, recommend the following: Two people to help with walking and/or transfers;A lot of help with bathing/dressing/bathroom;Assistance with cooking/housework;Assist for transportation   Can travel by private vehicle     No  Equipment Recommendations  None recommended by PT;Other (comment) (defer to next venue)    Recommendations for Other  Services       Precautions / Restrictions Precautions Precautions: Fall Restrictions Weight Bearing Restrictions Per Provider Order: Yes LLE Weight Bearing Per Provider Order: Partial weight bearing LLE Partial Weight Bearing Percentage or Pounds: 50%     Mobility  Bed Mobility Overal bed mobility: Needs Assistance Bed Mobility: Supine to Sit     Supine to sit: Max assist, HOB elevated     General bed mobility comments: max A to mobilize to EOB, significantly increased time and cues, pt inching LE to EOB, max A with bedpad to upright trunk and fully mobilize to EOB    Transfers Overall transfer level: Needs assistance Equipment used: Rolling walker (2 wheels) Transfers: Sit to/from Stand Sit to Stand: Mod assist   Step pivot transfers: Mod assist, +2 safety/equipment       General transfer comment: mod A to power up to standing from EOB for 3 reps, mod A+2 for safety for step pivot to recliner, heavy verbal cues for WB status and pt appears to respect with lean to R side and BUE assist on RW; pt fatigued wtih transfer, ambulation not attempted    Ambulation/Gait                   Stairs             Wheelchair Mobility     Tilt Bed    Modified Rankin (Stroke Patients Only)       Balance Overall balance assessment: History of Falls, Needs assistance Sitting-balance support: Feet supported Sitting balance-Leahy Scale: Fair     Standing balance support: Reliant on assistive device  for balance, During functional activity Standing balance-Leahy Scale: Poor                              Cognition Arousal: Alert Behavior During Therapy: WFL for tasks assessed/performed Overall Cognitive Status: History of cognitive impairments - at baseline                                 General Comments: pt talking to individuals not present in room upon arival, unaware she is in hospital, daughter reports hospital delirium is normal  for pt and pt appears to be experiencing it currently        Exercises      General Comments General comments (skin integrity, edema, etc.): pt appears fatigued with mobility, on RA with SpO2 90-94%, cued for pursed lip breathing and able to maintain >92%, HR 80s and 90s with mobility, BP 164/69 post transfer and pt denies dizziness      Pertinent Vitals/Pain Pain Assessment Pain Assessment: 0-10 Pain Score: 4  Pain Location: L LE Pain Descriptors / Indicators: Discomfort, Guarding Pain Intervention(s): Limited activity within patient's tolerance, Monitored during session, Repositioned    Home Living                          Prior Function            PT Goals (current goals can now be found in the care plan section) Acute Rehab PT Goals Patient Stated Goal: to be able to get stronger and go back to ALF, PT Goal Formulation: With patient Time For Goal Achievement: 07/26/23 Potential to Achieve Goals: Good Progress towards PT goals: Progressing toward goals (limited by confusion)    Frequency    Min 1X/week      PT Plan      Co-evaluation              AM-PAC PT "6 Clicks" Mobility   Outcome Measure  Help needed turning from your back to your side while in a flat bed without using bedrails?: A Lot Help needed moving from lying on your back to sitting on the side of a flat bed without using bedrails?: A Lot Help needed moving to and from a bed to a chair (including a wheelchair)?: A Lot Help needed standing up from a chair using your arms (e.g., wheelchair or bedside chair)?: A Lot Help needed to walk in hospital room?: Total Help needed climbing 3-5 steps with a railing? : Total 6 Click Score: 10    End of Session Equipment Utilized During Treatment: Gait belt Activity Tolerance: Patient limited by fatigue Patient left: in chair;with call bell/phone within reach;with chair alarm set;with family/visitor present Nurse Communication: Mobility  status;Other (comment) (pt confusion/cognition) PT Visit Diagnosis: Other abnormalities of gait and mobility (R26.89);Unsteadiness on feet (R26.81);Repeated falls (R29.6);Muscle weakness (generalized) (M62.81);Difficulty in walking, not elsewhere classified (R26.2);Pain Pain - Right/Left: Left Pain - part of body: Leg;Hip     Time: 1214-1252 PT Time Calculation (min) (ACUTE ONLY): 38 min  Charges:    $Therapeutic Activity: 38-52 mins PT General Charges $$ ACUTE PT VISIT: 1 Visit                     Tori Loveda Colaizzi PT, DPT 07/15/23, 2:15 PM

## 2023-07-16 DIAGNOSIS — R531 Weakness: Secondary | ICD-10-CM | POA: Diagnosis not present

## 2023-07-16 DIAGNOSIS — S72009D Fracture of unspecified part of neck of unspecified femur, subsequent encounter for closed fracture with routine healing: Secondary | ICD-10-CM | POA: Diagnosis not present

## 2023-07-16 DIAGNOSIS — Z515 Encounter for palliative care: Secondary | ICD-10-CM | POA: Diagnosis not present

## 2023-07-16 DIAGNOSIS — Z7189 Other specified counseling: Secondary | ICD-10-CM | POA: Diagnosis not present

## 2023-07-16 LAB — AMMONIA: Ammonia: 15 umol/L (ref 9–35)

## 2023-07-16 LAB — CBC
HCT: 28 % — ABNORMAL LOW (ref 36.0–46.0)
Hemoglobin: 8.9 g/dL — ABNORMAL LOW (ref 12.0–15.0)
MCH: 31.7 pg (ref 26.0–34.0)
MCHC: 31.8 g/dL (ref 30.0–36.0)
MCV: 99.6 fL (ref 80.0–100.0)
Platelets: 200 10*3/uL (ref 150–400)
RBC: 2.81 MIL/uL — ABNORMAL LOW (ref 3.87–5.11)
RDW: 14.3 % (ref 11.5–15.5)
WBC: 8.3 10*3/uL (ref 4.0–10.5)
nRBC: 0 % (ref 0.0–0.2)

## 2023-07-16 LAB — BASIC METABOLIC PANEL
Anion gap: 8 (ref 5–15)
BUN: 26 mg/dL — ABNORMAL HIGH (ref 8–23)
CO2: 19 mmol/L — ABNORMAL LOW (ref 22–32)
Calcium: 8.8 mg/dL — ABNORMAL LOW (ref 8.9–10.3)
Chloride: 109 mmol/L (ref 98–111)
Creatinine, Ser: 0.85 mg/dL (ref 0.44–1.00)
GFR, Estimated: >60 mL/min (ref >60)
Glucose, Bld: 105 mg/dL — ABNORMAL HIGH (ref 70–99)
Potassium: 3.5 mmol/L (ref 3.5–5.1)
Sodium: 136 mmol/L (ref 135–145)

## 2023-07-16 LAB — VITAMIN B12: Vitamin B-12: 580 pg/mL (ref 180–914)

## 2023-07-16 MED ORDER — SODIUM CHLORIDE 0.9 % IV SOLN
1.0000 g | INTRAVENOUS | Status: DC
  Administered 2023-07-16 – 2023-07-18 (×3): 1 g via INTRAVENOUS
  Filled 2023-07-16 (×3): qty 10

## 2023-07-16 MED ORDER — ZOLPIDEM TARTRATE 5 MG PO TABS
5.0000 mg | ORAL_TABLET | Freq: Every day | ORAL | Status: DC
  Administered 2023-07-16 – 2023-07-17 (×2): 5 mg via ORAL
  Filled 2023-07-16 (×2): qty 1

## 2023-07-16 MED ORDER — SODIUM CHLORIDE 0.9 % IV SOLN: INTRAVENOUS | Status: DC

## 2023-07-16 NOTE — Consult Note (Signed)
Consultation Note Date: 07/16/2023   Patient Name: Evelyn [name]  DOB: [birth year]  MRN: [id number]  Age / Sex: 89 y.o., female  PCP: Patient, No Pcp Per Referring Physician: Lanae Boast, MD  Reason for Consultation: Establishing goals of care  HPI/Patient Profile: 89 y.o. female admitted on 07/12/2023  89 y.o. from assisted living facility female with medical history significant for depression, hyperlipidemia and prior left hip fracture and arthroplasty, traumatic pelvic fracture presented after a fall  Clinical Assessment and Goals of Care: Patient remains admitted to hospital medicine service for periprosthetic fracture of left lesser trochanter after a mechanical fall.  She stands at increased risk for dislocation of her hip.  She has acute kidney injury and more recently has been diagnosed with urinary tract infection and her hemoglobin is also being serially monitored. Palliative services consulted Chart reviewed, palliative consult request received, patient seen and examined, discussed with daughter HCPOA who was present at the bedside Patient has a son who lives in [place], daughter lives locally and is primary caregiver Patient is from assisted living Orthopedics and physical therapy Occupational Therapy have evaluated the patient recommendations are for 50% weightbearing, antibiotics for UTI have been started.  Plan is for the patient to go to skilled nursing facility for rehabilitation attempt towards the end of this hospitalization. Today, the time of initial palliative consultation, discussed with the daughter about patient's current condition.  Differences between hospice versus palliative were explained to the best of my ability.  Recommended addition of palliative services at patient's SNF rehab and daughter is in agreement. Patient awakens easily and answers questions.  Appears with generalized  weakness.  Monitor hospital course and overall disease trajectory of illness. Palliative medicine is specialized medical care for people living with serious illness. It focuses on providing relief from the symptoms and stress of a serious illness. The goal is to improve quality of life for both the patient and the family. Goals of care: Broad aims of medical therapy in relation to the patient's values and preferences. Our aim is to provide medical care aimed at enabling patients to achieve the goals that matter most to them, given the circumstances of their particular medical situation and their constraints.    HCPOA  Daughter Ms Hipp who is present at bedside.   SUMMARY OF RECOMMENDATIONS    Agree with DNR Monitor hospital course, monitor for pain and non pain symptoms, monitor PO intake and participation with PT.  Recommend addition of palliative services at [facility] rehab on discharge.  Brief overview of hospice versus palliative services and differences between the two given to the patient's daughter present at bedside.  Thank you for the consult.   Code Status/Advance Care Planning: DNR   Symptom Management:     Palliative Prophylaxis:  Delirium Protocol    Psycho-social/Spiritual:  Desire for further Chaplaincy support:yes Additional Recommendations: Caregiving  Support/Resources  Prognosis:  Unable to determine  Discharge Planning: Skilled Nursing Facility for rehab with Palliative care service follow-up  Primary Diagnoses: Present on Admission:  Bilateral acute nondisplaced hip fracture (HCC)   I have reviewed the medical record, interviewed the patient and family, and examined the patient. The following aspects are pertinent.  Past Medical History:  Diagnosis Date   Allergy    Arthritis    Chronic cystitis    Congenital blindness    right eye   Depression    Gastric ulcer    H/O: hematuria    Hepatitis, unspecified    statin related,  resolved quickly   Hyperlipidemia    Legally blind in right eye, as defined in [place]    Syncope and collapse 04/01/2014   UTI (lower urinary tract infection)    HISTORY OF UTI   Social History   Socioeconomic History   Marital status: Widowed    Spouse name: Not on file   Number of children: Not on file   Years of education: Not on file   Highest education level: Not on file  Occupational History   Occupation: Retired  Tobacco Use   Smoking status: Never   Smokeless tobacco: Never  Vaping Use   Vaping status: Never Used  Substance and Sexual Activity   Alcohol use: No   Drug use: No   Sexual activity: Not Currently  Other Topics Concern   Not on file  Social History Narrative   Per [facility] New Patient Packet Abstracted on 12/09/2020:      Diet: Normal       Caffeine: Tea      Married, if yes what year: Widowed, married in 1951      Do you live in a house, apartment, assisted living, condo, trailer, ect: House      Is it one or more stories: More       How many persons live in your home? 1      Pets: No      Highest level or education completed: BA      Current/Past profession: Teacher      Exercise:       Left blank            Type and how often: Left Blank          Living Will: Yes   DNR: Yes   POA/HPOA: Yes      Functional Status:   Do you have difficulty bathing or dressing yourself? No   Do you have difficulty preparing food or eating? No   Do you have difficulty managing your medications? No   Do you have difficulty managing your finances? No   Do you have difficulty affording your medications? No   Social Drivers of Corporate investment banker Strain: Low Risk  (09/09/2020)   Overall Financial Resource Strain (CARDIA)    Difficulty of Paying Living Expenses: Not hard at all  Food Insecurity: No Food Insecurity (07/12/2023)   Hunger Vital Sign    Worried About Running Out of Food in the Last Year: Never true    Ran Out of Food in the Last Year: Never  true  Transportation Needs: No Transportation Needs (07/12/2023)   PRAPARE - Administrator, Civil Service (Medical): No    Lack of Transportation (Non-Medical): No  Physical Activity: Sufficiently Active (09/09/2020)   Exercise Vital Sign    Days of Exercise per Week: 5 days    Minutes of Exercise per Session: 30 min  Stress: No Stress Concern Present (09/09/2020)   [employer] of Occupational Health -  Occupational Stress Questionnaire    Feeling of Stress : Not at all  Social Connections: Moderately Integrated (07/12/2023)   Social Connection and Isolation Panel [NHANES]    Frequency of Communication with Friends and Family: More than three times a week    Frequency of Social Gatherings with Friends and Family: More than three times a week    Attends Religious Services: 1 to 4 times per year    Active Member of [employer] or Organizations: No    Attends Banker Meetings: 1 to 4 times per year    Marital Status: Widowed   Family History  Problem Relation Age of Onset   Cancer Neg Hx        Colon cancer   Stroke Neg Hx    Heart disease Neg Hx    Hyperlipidemia Neg Hx    Hypertension Neg Hx    Kidney disease Neg Hx    Colon polyps Neg Hx    Colon cancer Neg Hx    Scheduled Meds:  aspirin EC  81 mg Oral Daily   cholecalciferol  1,000 Units Oral Daily   docusate sodium  100 mg Oral BID   enoxaparin (LOVENOX) injection  30 mg Subcutaneous Q24H   FLUoxetine  40 mg Oral Daily   pantoprazole  40 mg Oral q AM   zolpidem  5 mg Oral QHS   Continuous Infusions:  sodium chloride 50 mL/hr at 07/16/23 1005   cefTRIAXone (ROCEPHIN)  IV 1 g (07/16/23 0807)   PRN Meds:.acetaminophen **OR** acetaminophen, albuterol, ondansetron **OR** ondansetron (ZOFRAN) IV, polyethylene glycol Medications Prior to Admission:  Prior to Admission medications   Medication Sig Start Date End Date Taking? Authorizing Provider  acetaminophen (TYLENOL) 500 MG tablet Take 500 mg by  mouth in the morning and at bedtime.   Yes [provider]  aspirin EC 81 MG tablet Take 81 mg by mouth daily.   Yes [provider]  Bacillus Coagulans-Inulin (PROBIOTIC FORMULA PO) Take 1 capsule by mouth in the morning.   Yes [provider]  busPIRone (BUSPAR) 5 MG tablet Take 5 mg by mouth 2 (two) times daily.   Yes [provider]  cholecalciferol (VITAMIN D3) 25 MCG (1000 UNIT) tablet Take 1,000 Units by mouth daily.   Yes [provider]  Cranberry-Vitamin C-Probiotic (AZO CRANBERRY PO) Take 1 tablet by mouth daily.   Yes [provider]  FLUoxetine (PROZAC) 40 MG capsule Take 40 mg by mouth daily.   Yes [provider]  folic acid (FOLVITE) 1 MG tablet Take 1 mg by mouth in the morning.   Yes [provider]  HYDROcodone-acetaminophen (NORCO/VICODIN) 5-325 MG tablet Take 0.5 tablets by mouth every 6 (six) hours as needed for severe pain. Patient taking differently: Take 0.5-1 tablets by mouth See admin instructions. Take 0.5 tablet by mouth in the morning and 1 tablet by mouth at bedtime.  PRN order: take 0.5 tablet by mouth every 12 hours as needed for pain 12/28/22  Yes Osvaldo Shipper, MD  lidocaine 4 % Place 1 patch onto the skin daily. To lower back. 12 hours on and 12 hours off   Yes [provider]  Multiple Vitamin (DAILY VITE PO) Take 1 tablet by mouth daily.   Yes [provider]  ondansetron (ZOFRAN) 4 MG tablet Take 4 mg by mouth every 8 (eight) hours as needed for nausea or vomiting.   Yes [provider]  OVER THE COUNTER MEDICATION Take 1 tablet  by mouth in the morning and at bedtime. QC stool softner/laxative   Yes [provider]  pantoprazole (PROTONIX) 40 MG tablet Take 40 mg by mouth in the morning.   Yes [provider]  polyethylene glycol (MIRALAX / GLYCOLAX) 17 g packet Take 17 g by mouth 2 (two) times daily as needed. Patient taking differently: Take 17  g by mouth daily. 03/21/23  Yes Medina-Vargas, Monina C, NP  polyethylene glycol (MIRALAX / GLYCOLAX) 17 g packet Take 17 g by mouth daily as needed for mild constipation, moderate constipation or severe constipation.   Yes [provider]  Skin Protectants, Misc. (BAZA PROTECT EX) Apply 1 application  topically 2 (two) times daily. To buttocks   Yes [provider]  zolpidem (AMBIEN) 5 MG tablet Take 1 tablet (5 mg total) by mouth at bedtime. 12/28/22  Yes Osvaldo Shipper, MD   No Known Allergies Review of Systems +weakness +confusion  Physical Exam Frail appearing elderly lady resting in bed No distress Regular work of breathing No edema Pain upon attempting to move LE  Vital Signs: BP (!) 153/66 (BP Location: Left Arm)   Pulse 81   Temp 97.8 F (36.6 C) (Oral)   Resp 16   Ht 5\' 3"  (1.6 m)   Wt 47.8 kg   SpO2 98%   BMI 18.67 kg/m  Pain Scale: 0-10   Pain Score: 0-No pain   SpO2: SpO2: 98 % O2 Device:SpO2: 98 % O2 Flow Rate: .   IO: Intake/output summary:  Intake/Output Summary (Last 24 hours) at 07/16/2023 1502 Last data filed at 07/16/2023 1127 Gross per 24 hour  Intake 0 ml  Output 1450 ml  Net -1450 ml    LBM: Last BM Date : 07/11/23 Baseline Weight: Weight: 47.8 kg Most recent weight: Weight: 47.8 kg     Palliative Assessment/Data:   PPS 40%  Time In: 1410 Time Out:  1510 Time Total:  60  Greater than 50%  of this time was spent counseling and coordinating care related to the above assessment and plan.  Signed by: Rosalin Hawking, MD   Please contact Palliative Medicine Team phone at [phone] for questions and concerns.  For individual provider: See Loretha Stapler

## 2023-07-16 NOTE — Progress Notes (Signed)
PROGRESS NOTE JAMILIA JACQUES  ZOX:[id number] DOB: Oct 23, 1924 DOA: 07/12/2023 PCP: Patient, No Pcp Per  Brief Narrative/Hospital Course: 89 y.o. from assisted living facility female with medical history significant for depression, hyperlipidemia and prior left hip fracture and arthroplasty, traumatic pelvic fracture presented after a fall on the way to the bathroom onto her left side.  Denies hitting her head, losing consciousness, or any recent illness or other complaints. In the ED afebrile vitals fairly stable although BP initially high.  Labs with mild hyponatremia BUN 38 stable creatinine bicarb 22 leukocytosis 12.9 hemoglobin anemic 10.9 g. Underwent imaging with x-ray chest, lumbar spine hip> moderate to large hiatal hernia, emphysema osteopenia diffuse moderate chronic wedge compression fracture T12 L3 L2, old left hip replacement noted, acute periprosthetic fracture at the base of the lesser trochanter of the left femur Dr. Ophelia Charter of orthopedic surger consulted and admitted for further management.  Subjective: Seen and examined this morning lethargic but able to wake up and tell me her name Patient had been more confused than baseline, UA ? UTI, culture sent and antibiotics started 1/25  Labs pending > subsequently resulted and overall stable although 1 drop in hemoglobin some No obvious bleeding noticed  Assessment and Plan: Principal Problem:   Bilateral acute nondisplaced hip fracture (HCC) Active Problems:   Closed displaced fracture of lesser trochanter of left femur (HCC)   Periprosthetic fracture of lesser trochanter on left Mechanical fall Increased risk for dislocation of her hip: S/P reduction of hip. Previous left hip hemiarthroplasty.  Orthopedics consulted -if she develops instability of the prosthesis subsides then surgery may be necessary advised to continue PT OT mobilize,50% weightbearing.  Given her altered mental status holding further hydrocodone continue  Tylenol. If remains stable plan for SNF  AKI is resolved with IV fluids encourage oral intake-with her poor intake starting IV fluid hydration Recent Labs    12/23/22 1459 12/24/22 0323 12/25/22 0135 12/28/22 0433 03/21/23 0951 07/12/23 0728 07/13/23 0446 07/14/23 0440 07/15/23 0432 07/16/23 1015  BUN 32* 27* 21 24* 18 30* 41* 38* 29* 26*  CREATININE 1.29* 1.15* 0.95 1.01* 0.94 0.82 1.23* 1.33* 0.87 0.85  CO2 18* 17* 19* 20* 22 20* 22 18* 19* 19*  K 4.1 3.9 3.9 3.6 4.5 4.0 4.4 4.4 3.8 3.5    Leukocytosis: ?reactive on admission.Resolved.   Macrocytic anemia:appears chronic checked anemia panel-stable.  Monitor hemoglobin watch for bleeding Recent Labs    12/28/22 0433 03/21/23 0951 07/12/23 0728 07/13/23 0446 07/16/23 1015  HGB 11.3* 13.2 10.9* 10.4* 8.9*  MCV 94.3 98.6 98.0 100.6* 99.6  VITAMINB12  --   --   --  387 580  FOLATE  --   --   --  >40.0  --   FERRITIN  --   --   --  45  --   TIBC  --   --   --  370  --   IRON  --   --   --  35  --   RETICCTPCT  --   --   --  2.0  --    Mild memory issues/forgetful at baseline.Continue supportive care, delerium precautions.   Anxiety disorder: She is mostly lethargic, holding home BuSpar continue Prozac she takes Ambien nightly chronically  Acute metabolic encephalopathy/delirium possible UTI versus hospitalization hip fracture pain medication induced: Minimize opiates,, continue hydration PT OT check B12 ammonia level.  Start empiric ceftriaxone pending further culture for ? UTI.  Hiatal hernia moderate to large: Continue PPI  Chronic wedge compression fracture: Chronic.  Cont Pain management. Emphysema of lung: Asymptomatic.add IS. Diffuse osteopenia: resume vitamin D GERD: cont PPI  GOC: DNR Peognosis guarded -plan of care consultation has been requested I had discussed extents with patient's son this morning-prognosis remains to be seen-they are hopeful if she improves plan for SNF but given her advanced age  recurrent hospitalization fall fractures-will need to see if she is able to recover.  DVT prophylaxis: enoxaparin (LOVENOX) injection 30 mg Start: 07/13/23 1000 SCDs Start: 07/12/23 0946 Code Status:   Code Status: Limited: Do not attempt resuscitation (DNR) -DNR-LIMITED -Do Not Intubate/DNI  Family Communication: plan of care discussed with patient/none at bedside. 1/23 Called her daughter Lisa-updated. I called son and updated 1/25 AM  Patient status is: Remains hospitalized because of severity of illness Level of care: Med-Surg   Dispo: The patient is from: ALF.  Daughter lives close by            Anticipated disposition: Medically stable, awaiting SNF  Objective: Vitals last 24 hrs: Vitals:   07/15/23 0508 07/15/23 1427 07/15/23 2122 07/16/23 0634  BP: (!) 162/63 (!) 131/49 (!) 186/73 (!) 165/85  Pulse: 85 88 (!) 101 83  Resp: 18 18 18 18   Temp: 98.8 F (37.1 C) 98.6 F (37 C) 97.9 F (36.6 C) (!) 97.4 F (36.3 C)  TempSrc: Oral Axillary Oral Oral  SpO2: 95% 94% 92% 91%  Weight:      Height:      Weight change:   Physical Examination: General exam: Lethargic but able to wake up and tell me her name confused about location  HEENT:Oral mucosa moist, Ear/Nose WNL grossly Respiratory system: Bilaterally clear BS,no use of accessory muscle Cardiovascular system: S1 & S2 +, No JVD. Gastrointestinal system: Abdomen soft,NT,ND, BS+ Nervous System: Lethargic moves her upper extremities, painful lower extremities movement -left lower extremity less mobile Extremities: LE edema neg,distal peripheral pulses palpable and warm.  Skin: No rashes,no icterus. MSK: Thin frail   Medications reviewed:  Scheduled Meds:  aspirin EC  81 mg Oral Daily   cholecalciferol  1,000 Units Oral Daily   docusate sodium  100 mg Oral BID   enoxaparin (LOVENOX) injection  30 mg Subcutaneous Q24H   FLUoxetine  40 mg Oral Daily   pantoprazole  40 mg Oral q AM   zolpidem  5 mg Oral QHS  Continuous  Infusions:  sodium chloride 50 mL/hr at 07/16/23 1005   cefTRIAXone (ROCEPHIN)  IV 1 g (07/16/23 0454)     Diet Order             Diet regular Room service appropriate? Yes; Fluid consistency: Thin  Diet effective now                  Intake/Output Summary (Last 24 hours) at 07/16/2023 1201 Last data filed at 07/16/2023 1127 Gross per 24 hour  Intake 120 ml  Output 1750 ml  Net -1630 ml  Net IO Since Admission: 1,120.97 mL [07/16/23 1201]  Wt Readings from Last 3 Encounters:  07/12/23 47.8 kg  03/17/23 48.4 kg  12/28/22 48.5 kg    Unresulted Labs (From admission, onward)     Start     Ordered   07/15/23 1524  Urine Culture (for pregnant, neutropenic or urologic patients or patients with an indwelling urinary catheter)  (Urine Labs)  Once,   R       Question:  Indication  Answer:  Altered mental status (if no other  cause identified)   07/15/23 1523   07/13/23 1033  Urinalysis, Routine w reflex microscopic -Urine, Clean Catch  Once,   R       Question:  Specimen Source  Answer:  Urine, Clean Catch   07/13/23 1032          Data Reviewed: I have personally reviewed following labs and imaging studies CBC: Recent Labs  Lab 07/12/23 0728 07/13/23 0446 07/16/23 1015  WBC 12.9* 8.3 8.3  NEUTROABS 10.6*  --   --   HGB 10.9* 10.4* 8.9*  HCT 34.4* 32.8* 28.0*  MCV 98.0 100.6* 99.6  PLT 215 202 200   Basic Metabolic Panel:  Recent Labs  Lab 07/12/23 0728 07/13/23 0446 07/14/23 0440 07/15/23 0432 07/16/23 1015  NA 134* 136 134* 134* 136  K 4.0 4.4 4.4 3.8 3.5  CL 106 104 105 107 109  CO2 20* 22 18* 19* 19*  GLUCOSE 107* 111* 108* 123* 105*  BUN 30* 41* 38* 29* 26*  CREATININE 0.82 1.23* 1.33* 0.87 0.85  CALCIUM 8.8* 9.1 8.8* 8.2* 8.8*   GFR: Estimated Creatinine Clearance: 29.2 mL/min (by C-G formula based on SCr of 0.85 mg/dL). No results found for this or any previous visit (from the past 240 hours).   Antimicrobials/Microbiology: Anti-infectives (From  admission, onward)    Start     Dose/Rate Route Frequency Ordered Stop   07/16/23 0730  cefTRIAXone (ROCEPHIN) 1 g in sodium chloride 0.9 % 100 mL IVPB        1 g 200 mL/hr over 30 Minutes Intravenous Every 24 hours 07/16/23 0628           Component Value Date/Time   SDES URINE, RANDOM 12/24/2022 0530   SPECREQUEST NONE Reflexed from [id number] 12/24/2022 0530   CULT  12/24/2022 0530    NO GROWTH Performed at [facility], [address] [place], [place] [address]    REPTSTATUS 12/25/2022 FINAL 12/24/2022 0530  Radiology Studies: No results found.  LOS: 3 days   Total time spent in review of labs and imaging, patient evaluation, formulation of plan, documentation and communication with family: 35 minutes  Lanae Boast, MD  [facility]  07/16/2023, 12:01 PM

## 2023-07-17 DIAGNOSIS — Z515 Encounter for palliative care: Secondary | ICD-10-CM | POA: Diagnosis not present

## 2023-07-17 DIAGNOSIS — S72009D Fracture of unspecified part of neck of unspecified femur, subsequent encounter for closed fracture with routine healing: Secondary | ICD-10-CM | POA: Diagnosis not present

## 2023-07-17 LAB — CBC
HCT: 27.1 % — ABNORMAL LOW (ref 36.0–46.0)
Hemoglobin: 8.6 g/dL — ABNORMAL LOW (ref 12.0–15.0)
MCH: 32.2 pg (ref 26.0–34.0)
MCHC: 31.7 g/dL (ref 30.0–36.0)
MCV: 101.5 fL — ABNORMAL HIGH (ref 80.0–100.0)
Platelets: 206 10*3/uL (ref 150–400)
RBC: 2.67 MIL/uL — ABNORMAL LOW (ref 3.87–5.11)
RDW: 14.2 % (ref 11.5–15.5)
WBC: 7.8 10*3/uL (ref 4.0–10.5)
nRBC: 0 % (ref 0.0–0.2)

## 2023-07-17 LAB — URINE CULTURE: Culture: >=100000 — AB

## 2023-07-17 LAB — BASIC METABOLIC PANEL
Anion gap: 9 (ref 5–15)
BUN: 23 mg/dL (ref 8–23)
CO2: 17 mmol/L — ABNORMAL LOW (ref 22–32)
Calcium: 8.4 mg/dL — ABNORMAL LOW (ref 8.9–10.3)
Chloride: 111 mmol/L (ref 98–111)
Creatinine, Ser: 0.68 mg/dL (ref 0.44–1.00)
GFR, Estimated: >60 mL/min (ref >60)
Glucose, Bld: 108 mg/dL — ABNORMAL HIGH (ref 70–99)
Potassium: 3.3 mmol/L — ABNORMAL LOW (ref 3.5–5.1)
Sodium: 137 mmol/L (ref 135–145)

## 2023-07-17 NOTE — Progress Notes (Signed)
Daily Progress Note   Patient Name: Evelyn [name]       Date: 07/17/2023 DOB: Nov 01, 1924  Age: 89 y.o. MRN#: [id number] Attending Physician: Lanae Boast, MD Primary Care Physician: Patient, No Pcp Per Admit Date: 07/12/2023  Reason for Consultation/Follow-up: Establishing goals of care  Subjective:  Awakens easily, no distress, in no distress, more alert, PO intake improving.   Length of Stay: 4  Current Medications: Scheduled Meds:   aspirin EC  81 mg Oral Daily   cholecalciferol  1,000 Units Oral Daily   docusate sodium  100 mg Oral BID   enoxaparin (LOVENOX) injection  30 mg Subcutaneous Q24H   FLUoxetine  40 mg Oral Daily   pantoprazole  40 mg Oral q AM   zolpidem  5 mg Oral QHS    Continuous Infusions:  sodium chloride 75 mL/hr at 07/16/23 2328   cefTRIAXone (ROCEPHIN)  IV 1 g (07/17/23 0744)    PRN Meds: acetaminophen **OR** acetaminophen, albuterol, ondansetron **OR** ondansetron (ZOFRAN) IV, polyethylene glycol  Physical Exam         Awakens easily No distress Regular work of breathing  Vital Signs: BP (!) 167/67 (BP Location: Right Arm)   Pulse 84   Temp 98.2 F (36.8 C) (Oral)   Resp 16   Ht 5\' 3"  (1.6 m)   Wt 47.8 kg   SpO2 97%   BMI 18.67 kg/m  SpO2: SpO2: 97 % O2 Device: O2 Device: Room Air O2 Flow Rate:    Intake/output summary:  Intake/Output Summary (Last 24 hours) at 07/17/2023 1136 Last data filed at 07/17/2023 0900 Gross per 24 hour  Intake 1809.47 ml  Output 850 ml  Net 959.47 ml   LBM: Last BM Date : 07/16/23 Baseline Weight: Weight: 47.8 kg Most recent weight: Weight: 47.8 kg       Palliative Assessment/Data:      Patient Active Problem List   Diagnosis Date Noted   Closed displaced fracture of lesser trochanter of left femur  (HCC) 07/13/2023   Closed fracture of ramus of right pubis (HCC) 12/24/2022   History of bilateral hip arthroplasty 12/23/2022   Lower lobe pneumonia 04/03/2022   Debility 04/03/2022   Pyuria 12/18/2021   Fever 12/18/2021   Macrocytic anemia 12/18/2021   Malnutrition of moderate degree 12/18/2021   Fracture of  femoral neck, left (HCC) 12/17/2021   Bilateral acute nondisplaced hip fracture (HCC) 12/17/2021   Delirium 11/09/2021   Essential hypertension 11/08/2021   Hypokalemia 11/08/2021   Compression fracture of body of thoracic vertebra (HCC) 11/06/2021   Acute anemia 11/06/2021   Cognitive impairment 11/06/2021   Lactic acidosis 11/06/2021   Black stools    Heme + stool    Peptic ulcer disease    Acute metabolic encephalopathy 11/05/2021   Closed wedge compression fracture of T12 vertebra (HCC) 11/24/2016   Chronic midline low back pain 11/18/2016   Fatigue 11/18/2016   CKD stage 3a, GFR 45-59 ml/min (HCC) 11/18/2016   IBS (irritable bowel syndrome)    UTI (urinary tract infection)    AKI (acute kidney injury) (HCC) 01/30/2015   Routine general medical examination at a health care facility 05/03/2013   Chronic idiopathic constipation 03/31/2012   Other screening mammogram 03/24/2011   Depression 03/06/2010   Insomnia 03/08/2008   HLD (hyperlipidemia) 02/21/2007    Palliative Care Assessment & Plan   Patient Profile:    Assessment:  Patient remains admitted to hospital medicine service for periprosthetic fracture of left lesser trochanter after a mechanical fall.  She stands at increased risk for dislocation of her hip.  She has acute kidney injury and more recently has been diagnosed with urinary tract infection and her hemoglobin is also being serially monitored. Palliative services consulted  Recommendations/Plan: Continue current mode of care Recommend SNF rehab with palliative     Code Status:    Code Status Orders  (From admission, onward)            Start     Ordered   07/12/23 1004  Do not attempt resuscitation (DNR)- Limited -Do Not Intubate (DNI)  (Code Status)  Continuous       Question Answer Comment  If pulseless and not breathing No CPR or chest compressions.   In Pre-Arrest Conditions (Patient Is Breathing and Has A Pulse) Do not intubate. Provide all appropriate non-invasive medical interventions. Avoid ICU transfer unless indicated or required.   Consent: Discussion documented in EHR or advanced directives reviewed      07/12/23 1003           Code Status History     Date Active Date Inactive Code Status Order ID Comments User Context   07/12/2023 0947 07/12/2023 1003 Full Code [id number]  Maryln Gottron, MD ED   12/23/2022 2127 12/28/2022 1622 Full Code [id number]  Tereasa Coop, MD Inpatient   04/03/2022 0120 04/06/2022 1738 DNR [id number]  Charlsie Quest, MD ED   12/20/2021 1608 12/21/2021 1531 DNR [id number]  Lanae Boast, MD Inpatient   12/17/2021 0925 12/20/2021 1608 Full Code [id number]  Dibia, Elgie Collard, MD ED   11/06/2021 0030 11/10/2021 1917 DNR [id number]  Anselm Jungling, DO Inpatient   01/30/2015 1850 02/01/2015 1724 Full Code [id number]  Maretta Bees, MD Inpatient   04/01/2014 2046 04/02/2014 2030 Full Code [id number]  Carron Curie, MD Inpatient      Advance Directive Documentation    Flowsheet Row Most Recent Value  Type of Advance Directive Out of facility DNR (pink MOST or yellow form)  Pre-existing out of facility DNR order (yellow form or pink MOST form) --  "MOST" Form in Place? --       Prognosis:  < 12 months  Discharge Planning: Skilled Nursing Facility for rehab with Palliative care service follow-up  Care plan was discussed with  IDT  Thank you for  allowing the Palliative Medicine Team to assist in the care of this patient.  Low MDM     Greater than 50%  of this time was spent counseling and coordinating care related to the above assessment and plan.  Rosalin Hawking, MD  Please contact Palliative  Medicine Team phone at [phone] for questions and concerns.

## 2023-07-17 NOTE — Progress Notes (Signed)
PROGRESS NOTE Evelyn [name]  ZOX:[id number] DOB: Nov 01, 1924 DOA: 07/12/2023 PCP: Patient, No Pcp Per  Brief Narrative/Hospital Course: 89 y.o. from assisted living facility female with medical history significant for depression, hyperlipidemia and prior left hip fracture and arthroplasty, traumatic pelvic fracture presented after a fall on the way to the bathroom onto her left side.  Denies hitting her head, losing consciousness, or any recent illness or other complaints. In the ED afebrile vitals fairly stable although BP initially high.  Labs with mild hyponatremia BUN 38 stable creatinine bicarb 22 leukocytosis 12.9 hemoglobin anemic 10.9 g. Underwent imaging with x-ray chest, lumbar spine hip> moderate to large hiatal hernia, emphysema osteopenia diffuse moderate chronic wedge compression fracture T12 L3 L2, old left hip replacement noted, acute periprosthetic fracture at the base of the lesser trochanter of the left femur Dr. Ophelia Charter of orthopedic surger consulted and admitted for further management.  Subjective: Patient seen and examined this morning She appears much more alert awake-to self place but got month wrong Nursing reports she ate her meal this morning Overnight afebrile, BP stable, labs shows mild hypokalemia   Assessment and Plan: Principal Problem:   Bilateral acute nondisplaced hip fracture (HCC) Active Problems:   Closed displaced fracture of lesser trochanter of left femur (HCC)   Periprosthetic fracture of lesser trochanter on left Mechanical fall Increased risk for dislocation of her hip: S/P reduction of hip. Previous left hip hemiarthroplasty.  Orthopedics consulted -if she develops instability of the prosthesis subsides then surgery may be necessary advised to continue PT OT mobilize,50% weightbearing.  Holding hydrocodone/opiates due to her altered mental status.  Continue Tylenol, lidocaine patch if needed and hopefully will be stable for SNF  AKI: Due to poor  oral intake, resolved with IV fluids, keeping on IVF while oral intake is marginal Recent Labs    12/24/22 0323 12/25/22 0135 12/28/22 0433 03/21/23 0951 07/12/23 0728 07/13/23 0446 07/14/23 0440 07/15/23 0432 07/16/23 1015 07/17/23 0516  BUN 27* 21 24* 18 30* 41* 38* 29* 26* 23  CREATININE 1.15* 0.95 1.01* 0.94 0.82 1.23* 1.33* 0.87 0.85 0.68  CO2 17* 19* 20* 22 20* 22 18* 19* 19* 17*  K 3.9 3.9 3.6 4.5 4.0 4.4 4.4 3.8 3.5 3.3*    Leukocytosis: ?reactive on admission.Resolved.   Macrocytic anemia:appears chronic checked anemia panel-stable.  Monitor hemoglobin watch for bleeding Recent Labs    03/21/23 0951 07/12/23 0728 07/13/23 0446 07/16/23 1015 07/17/23 0516  HGB 13.2 10.9* 10.4* 8.9* 8.6*  MCV 98.6 98.0 100.6* 99.6 101.5*  VITAMINB12  --   --  387 580  --   FOLATE  --   --  >40.0  --   --   FERRITIN  --   --  45  --   --   TIBC  --   --  370  --   --   IRON  --   --  35  --   --   RETICCTPCT  --   --  2.0  --   --    Mild memory issues/forgetful at baseline-not diagnosed with dementia formerly Anxiety disorder on chronic BuSpar Prozac and Ambien at home  Acute metabolic encephalopathy/delirium possible UTI versus hospitalization hip fracture pain medication induced: Worsening of mental status likely multifactorial in the setting of pain medication UTI hospitalization. B12 and mania and labs fairly okay.  Mentation better and less lethargic now.  Continue ceftriaxone for UTI PT OT delirium precaution.  Discontinued Norco and BuSpar 1/25  Proteus UTI: Patient is prone for frequent UTI as per family.  Continue ceftriaxone pending urine culture  Hiatal hernia moderate to large: Continue PPI Chronic wedge compression fracture: Chronic.  Cont Pain management. Emphysema of lung: Asymptomatic.add IS. Diffuse osteopenia: resume vitamin D GERD: cont PPI  GOC: DNR Peognosis guarded -plan of care consultation has been requested I had discussed extents with patient's  son this morning-prognosis remains to be seen-they are hopeful if she improves plan for SNF but given her advanced age recurrent hospitalization fall fractures-will need to see if she is able to recover.  Palliative care has seen the patient appreciate input  DVT prophylaxis: enoxaparin (LOVENOX) injection 30 mg Start: 07/13/23 1000 SCDs Start: 07/12/23 0946 Code Status:   Code Status: Limited: Do not attempt resuscitation (DNR) -DNR-LIMITED -Do Not Intubate/DNI  Family Communication: plan of care discussed with patient/none at bedside. 1/23 Called her daughter Lisa-updated. I called son and updated 1/25 AM and In PM. Called and updated daughter 1/26  Patient status is: Remains hospitalized because of severity of illness Level of care: Med-Surg   Dispo:The patient is from: ALF.  Daughter lives close by            Anticipated disposition: awaiting SNF  Objective: Vitals last 24 hrs: Vitals:   07/15/23 2122 07/16/23 0634 07/16/23 1315 07/16/23 2154  BP: (!) 186/73 (!) 165/85 (!) 153/66 (!) 167/67  Pulse: (!) 101 83 81 84  Resp: 18 18 16 16   Temp: 97.9 F (36.6 C) (!) 97.4 F (36.3 C) 97.8 F (36.6 C) 98.2 F (36.8 C)  TempSrc: Oral Oral Oral Oral  SpO2: 92% 91% 98% 97%  Weight:      Height:      Weight change:   Physical Examination: General exam:Alert awake, oriented X2. HEENT:Oral mucosa moist, Ear/Nose WNL grossly Respiratory system: Bilaterally clear BS,no use of accessory muscle Cardiovascular system: S1 & S2 +, No JVD. Gastrointestinal system: Abdomen soft,NT,ND, BS+ Nervous System: Alert, awake, moving all extremities,and following commands. Extremities: LE edema neg,distal peripheral pulses palpable and warm.  Skin: No rashes,no icterus. MSK: Normal muscle bulk,tone, power.   Medications reviewed:  Scheduled Meds:  aspirin EC  81 mg Oral Daily   cholecalciferol  1,000 Units Oral Daily   docusate sodium  100 mg Oral BID   enoxaparin (LOVENOX) injection  30 mg  Subcutaneous Q24H   FLUoxetine  40 mg Oral Daily   pantoprazole  40 mg Oral q AM   zolpidem  5 mg Oral QHS  Continuous Infusions:  sodium chloride 75 mL/hr at 07/16/23 2328   cefTRIAXone (ROCEPHIN)  IV 1 g (07/17/23 0744)     Diet Order             Diet regular Room service appropriate? Yes; Fluid consistency: Thin  Diet effective now                  Intake/Output Summary (Last 24 hours) at 07/17/2023 1113 Last data filed at 07/17/2023 0900 Gross per 24 hour  Intake 1809.47 ml  Output 1350 ml  Net 459.47 ml  Net IO Since Admission: 2,080.44 mL [07/17/23 1113]  Wt Readings from Last 3 Encounters:  07/12/23 47.8 kg  03/17/23 48.4 kg  12/28/22 48.5 kg    Unresulted Labs (From admission, onward)     Start     Ordered   07/17/23 0500  Basic metabolic panel  Daily,   R      07/16/23 1501   07/17/23 0500  CBC  Daily,   R      07/16/23 1501   07/13/23 1033  Urinalysis, Routine w reflex microscopic -Urine, Clean Catch  Once,   R       Question:  Specimen Source  Answer:  Urine, Clean Catch   07/13/23 1032          Data Reviewed: I have personally reviewed following labs and imaging studies CBC: Recent Labs  Lab 07/12/23 0728 07/13/23 0446 07/16/23 1015 07/17/23 0516  WBC 12.9* 8.3 8.3 7.8  NEUTROABS 10.6*  --   --   --   HGB 10.9* 10.4* 8.9* 8.6*  HCT 34.4* 32.8* 28.0* 27.1*  MCV 98.0 100.6* 99.6 101.5*  PLT 215 202 200 206   Basic Metabolic Panel:  Recent Labs  Lab 07/13/23 0446 07/14/23 0440 07/15/23 0432 07/16/23 1015 07/17/23 0516  NA 136 134* 134* 136 137  K 4.4 4.4 3.8 3.5 3.3*  CL 104 105 107 109 111  CO2 22 18* 19* 19* 17*  GLUCOSE 111* 108* 123* 105* 108*  BUN 41* 38* 29* 26* 23  CREATININE 1.23* 1.33* 0.87 0.85 0.68  CALCIUM 9.1 8.8* 8.2* 8.8* 8.4*   GFR: Estimated Creatinine Clearance: 31 mL/min (by C-G formula based on SCr of 0.68 mg/dL). Recent Results (from the past 240 hours)  Urine Culture (for pregnant, neutropenic or urologic  patients or patients with an indwelling urinary catheter)     Status: Abnormal (Preliminary result)   Collection Time: 07/15/23  3:24 PM   Specimen: In/Out Cath Urine  Result Value Ref Range Status   Specimen Description   Final    IN/OUT CATH URINE Performed at [facility], [address] [place], [place] [address]    Special Requests   Final    NONE Performed at [facility], [address], [place], [place] [address]    Culture (A)  Final    >=100,000 COLONIES/mL PROTEUS MIRABILIS SUSCEPTIBILITIES TO FOLLOW Performed at [facility], [address], [place], [place] [address]    Report Status PENDING  Incomplete     Antimicrobials/Microbiology: Anti-infectives (From admission, onward)    Start     Dose/Rate Route Frequency Ordered Stop   07/16/23 0730  cefTRIAXone (ROCEPHIN) 1 g in sodium chloride 0.9 % 100 mL IVPB        1 g 200 mL/hr over 30 Minutes Intravenous Every 24 hours 07/16/23 0628           Component Value Date/Time   SDES  07/15/2023 1524    IN/OUT CATH URINE Performed at [facility], [address] [place], [place] [address]    SPECREQUEST  07/15/2023 1524    NONE Performed at [facility], [address], [place], [place] [address]    CULT (A) 07/15/2023 1524    >=100,000 COLONIES/mL PROTEUS MIRABILIS SUSCEPTIBILITIES TO FOLLOW Performed at [facility], [address], [place], [place] [address]    REPTSTATUS PENDING 07/15/2023 1524  Radiology Studies: No results found.  LOS: 4 days   Total time spent in review of labs and imaging, patient evaluation, formulation of plan, documentation and communication with family: 35 minutes  Lanae Boast, MD  [facility]  07/17/2023, 11:13 AM

## 2023-07-18 DIAGNOSIS — S72122D Displaced fracture of lesser trochanter of left femur, subsequent encounter for closed fracture with routine healing: Secondary | ICD-10-CM

## 2023-07-18 LAB — BASIC METABOLIC PANEL
Anion gap: 8 (ref 5–15)
BUN: 22 mg/dL (ref 8–23)
CO2: 19 mmol/L — ABNORMAL LOW (ref 22–32)
Calcium: 8.6 mg/dL — ABNORMAL LOW (ref 8.9–10.3)
Chloride: 111 mmol/L (ref 98–111)
Creatinine, Ser: 0.87 mg/dL (ref 0.44–1.00)
GFR, Estimated: >60 mL/min (ref >60)
Glucose, Bld: 110 mg/dL — ABNORMAL HIGH (ref 70–99)
Potassium: 3.5 mmol/L (ref 3.5–5.1)
Sodium: 138 mmol/L (ref 135–145)

## 2023-07-18 LAB — CBC
HCT: 27.5 % — ABNORMAL LOW (ref 36.0–46.0)
Hemoglobin: 8.6 g/dL — ABNORMAL LOW (ref 12.0–15.0)
MCH: 31.5 pg (ref 26.0–34.0)
MCHC: 31.3 g/dL (ref 30.0–36.0)
MCV: 100.7 fL — ABNORMAL HIGH (ref 80.0–100.0)
Platelets: 207 10*3/uL (ref 150–400)
RBC: 2.73 MIL/uL — ABNORMAL LOW (ref 3.87–5.11)
RDW: 14.4 % (ref 11.5–15.5)
WBC: 6.4 10*3/uL (ref 4.0–10.5)
nRBC: 0 % (ref 0.0–0.2)

## 2023-07-18 MED ORDER — AMOXICILLIN 500 MG PO CAPS: 500.0000 mg | ORAL_CAPSULE | Freq: Two times a day (BID) | ORAL | Status: DC

## 2023-07-18 MED ORDER — AMOXICILLIN 500 MG PO CAPS: 500.0000 mg | ORAL_CAPSULE | Freq: Two times a day (BID) | ORAL | Status: AC

## 2023-07-18 NOTE — Progress Notes (Signed)
PMT no charge note  Patient discharging to [facility] for SNF rehab, have recommended palliative care as outpatient.  No new inpatient PMT specific recommendations at this time No charge Rosalin Hawking MD [facility] health palliative.

## 2023-07-18 NOTE — Progress Notes (Signed)
Physical Therapy Treatment Patient Details Name: Evelyn [name] MRN: [id number] DOB: 05-26-1925 Today's Date: 07/18/2023   History of Present Illness 89 yo female presents to therapy following mechanical fall in ALF on 07/12/2023 resulting in a L periprosthetic femur fx. Orthopedics consulted and recommend pain and conservative management with pt LLE 50% WB with use of RW. Pt PMH includes but is not limited to: falls including 12/2022 sacrum and pubic rami fx, depression, allergies, arthritis, blind R eye, HLD, L THA (2023) and R ORIF of radius.    PT Comments  Pt agreeable to PT with encouragement. Mobility limited by fatigue; pt requiring max to total assist for all basic functional tasks/bed mobility.   If plan is discharge home, recommend the following: Two people to help with walking and/or transfers;A lot of help with bathing/dressing/bathroom;Assistance with cooking/housework;Assist for transportation   Can travel by private vehicle     No  Equipment Recommendations  None recommended by PT;Other (comment)    Recommendations for Other Services       Precautions / Restrictions Precautions Precautions: Fall Restrictions LLE Weight Bearing Per Provider Order: Partial weight bearing LLE Partial Weight Bearing Percentage or Pounds: 50     Mobility  Bed Mobility Overal bed mobility: Needs Assistance Bed Mobility: Sit to Supine, Supine to Sit     Supine to sit: Max assist, HOB elevated Sit to supine: Total assist   General bed mobility comments: max A to mobilize to EOB, significantly increased time and cues, pt inching LE to EOB; total assist to return to supine requiring assist to lower trunk, lift LEs, to position and scoot to [facility]    Transfers                   General transfer comment: attempted, pt unable, placing limited effort to come to stand    Ambulation/Gait                   Stairs             Wheelchair Mobility     Tilt Bed     Modified Rankin (Stroke Patients Only)       Balance Overall balance assessment: History of Falls, Needs assistance Sitting-balance support: Feet supported, No upper extremity supported Sitting balance-Leahy Scale: Poor Sitting balance - Comments: poor top fair, LOB posteriorly and to right side; difficulty maintaining upright Postural control: Posterior lean                                  Cognition Arousal: Alert Behavior During Therapy: WFL for tasks assessed/performed Overall Cognitive Status: History of cognitive impairments - at baseline                                          Exercises General Exercises - Lower Extremity Long Arc Quad: AROM, Both, 5 reps, Limitations Long Arc Quad Limitations: limited Left side d/tpain    General Comments        Pertinent Vitals/Pain Pain Assessment Pain Assessment: Faces Faces Pain Scale: Hurts a little bit Pain Location: L LE with movement Pain Descriptors / Indicators: Discomfort, Grimacing Pain Intervention(s): Limited activity within patient's tolerance, Monitored during session, Repositioned    Home Living  Prior Function            PT Goals (current goals can now be found in the care plan section) Acute Rehab PT Goals Patient Stated Goal: to be able to get stronger and go back to ALF, PT Goal Formulation: With patient Time For Goal Achievement: 07/26/23 Potential to Achieve Goals: Good Progress towards PT goals: Progressing toward goals    Frequency    Min 1X/week      PT Plan      Co-evaluation              AM-PAC PT "6 Clicks" Mobility   Outcome Measure  Help needed turning from your back to your side while in a flat bed without using bedrails?: Total Help needed moving from lying on your back to sitting on the side of a flat bed without using bedrails?: Total Help needed moving to and from a bed to a chair (including a  wheelchair)?: Total Help needed standing up from a chair using your arms (e.g., wheelchair or bedside chair)?: Total Help needed to walk in hospital room?: Total Help needed climbing 3-5 steps with a railing? : Total 6 Click Score: 6    End of Session Equipment Utilized During Treatment: Gait belt Activity Tolerance: Patient limited by fatigue;Patient limited by pain Patient left: in bed;with call bell/phone within reach;with bed alarm set Nurse Communication: Mobility status;Other (comment) (limited by cognition) PT Visit Diagnosis: Other abnormalities of gait and mobility (R26.89);Unsteadiness on feet (R26.81);Repeated falls (R29.6);Muscle weakness (generalized) (M62.81);Difficulty in walking, not elsewhere classified (R26.2);Pain Pain - Right/Left: Left Pain - part of body: Leg;Hip     Time: 1211-1224 PT Time Calculation (min) (ACUTE ONLY): 13 min  Charges:    $Therapeutic Activity: 8-22 mins PT General Charges $$ ACUTE PT VISIT: 1 Visit                     Raad Clayson, PT  Acute Rehab Dept [facility]) [phone]  07/18/2023    [facility] 07/18/2023, 12:36 PM

## 2023-07-18 NOTE — TOC Transition Note (Signed)
Transition of Care [facility]) - Discharge Note   Patient Details  Name: Evelyn [name] MRN: [id number] Date of Birth: 08-Jun-1925  Transition of Care [facility]) CM/SW Contact:  Amada Jupiter, LCSW Phone Number: 07/18/2023, 1:01 PM   Clinical Narrative:     Pt medically cleared for dc today to SNF and bed accepted/ ready at [facility]).  Pt and daughter aware and agreeable.  PTAR called at 1300.  RN to call report to [phone].  No further TOC needs.  Final next level of care: Skilled Nursing Facility Barriers to Discharge: Barriers Resolved   Patient Goals and CMS Choice Patient states their goals for this hospitalization and ongoing recovery are:: return to ALF once SNF rehab complete          Discharge Placement PASRR number recieved: 07/13/23            Patient chooses bed at: [facility] Patient to be transferred to facility by: PTAR Name of family member notified: daughter, Misty Stanley Hipp Patient and family notified of of transfer: 07/18/23  Discharge Plan and Services Additional resources added to the After Visit Summary for   In-house Referral: Clinical Social Work   Post Acute Care Choice: Skilled Nursing Facility          DME Arranged: N/A                    Social Drivers of Health (SDOH) Interventions SDOH Screenings   Food Insecurity: No Food Insecurity (07/12/2023)  Housing: Low Risk  (07/12/2023)  Transportation Needs: No Transportation Needs (07/12/2023)  Utilities: Not At Risk (07/12/2023)  Alcohol Screen: Low Risk  (09/09/2020)  Depression (PHQ2-9): Low Risk  (03/17/2023)  Financial Resource Strain: Low Risk  (09/09/2020)  Physical Activity: Sufficiently Active (09/09/2020)  Social Connections: Moderately Integrated (07/12/2023)  Stress: No Stress Concern Present (09/09/2020)  Tobacco Use: Low Risk  (07/12/2023)     Readmission Risk Interventions    07/13/2023    2:03 PM 04/04/2022    1:26 PM  Readmission Risk Prevention Plan   Transportation Screening Complete Complete  PCP or Specialist Appt within 5-7 Days Complete Complete  Home Care Screening Complete Complete  Medication Review (RN CM) Complete Complete

## 2023-07-18 NOTE — Progress Notes (Signed)
Patient was picked up by PTAR and given a discharge packet. Report was called to [facility] [employer]) to [employer], and all questions were answered. Patient was stable for discharge. Misty Stanley, daughter, was in the room and will meet them at the facility.

## 2023-07-18 NOTE — Evaluation (Signed)
Clinical/Bedside Swallow Evaluation Patient Details  Name: Evelyn [name] MRN: [id number] Date of Birth: 06/11/1925  Today's Date: 07/18/2023 Time: SLP Start Time (ACUTE ONLY): 0850 SLP Stop Time (ACUTE ONLY): 0920 SLP Time Calculation (min) (ACUTE ONLY): 30 min  Past Medical History:  Past Medical History:  Diagnosis Date   Allergy    Arthritis    Chronic cystitis    Congenital blindness    right eye   Depression    Gastric ulcer    H/O: hematuria    Hepatitis, unspecified    statin related, resolved quickly   Hyperlipidemia    Legally blind in right eye, as defined in [place]    Syncope and collapse 04/01/2014   UTI (lower urinary tract infection)    HISTORY OF UTI   Past Surgical History:  Past Surgical History:  Procedure Laterality Date   ABDOMINAL HYSTERECTOMY     vaginal   BLADDER SUSPENSION     A-P   CATARACT EXTRACTION W/ INTRAOCULAR LENS IMPLANT Left    ESOPHAGOGASTRODUODENOSCOPY     HIP ARTHROPLASTY Left 12/17/2021   Procedure: ARTHROPLASTY BIPOLAR HIP (HEMIARTHROPLASTY);  Surgeon: Eldred Manges, MD;  Location: [facility];  Service: Orthopedics;  Laterality: Left;   OPEN REDUCTION INTERNAL FIXATION (ORIF) DISTAL RADIAL FRACTURE Right 03/09/2016   Procedure: OPEN REDUCTION INTERNAL FIXATION (ORIF) DISTAL RADIAL FRACTURE;  Surgeon: Betha Loa, MD;  Location: [facility];  Service: Orthopedics;  Laterality: Right;   HPI:  Patient is a 89 y.o. female with PMH: hiatal hernia, esophageal dysphagia with h/o food impaction in esophagus, depression, mild memory issues/forgetful (not formally diagnosed with dementia), HLD, prior left hip fracture s/p arthroplasty, traumatic pelvic fracture who presented to the hospital on 07/12/23 after a fall on her left side.  In ED, vitals were stable but BP was high. She underwent CXR imaging which showed moderate to large hiatal hernia, emphysema. MD concerned for increased confusion on 1/25 but UA negative except for 1 drop in  hemoglobin.    Assessment / Plan / Recommendation  Clinical Impression  Patient is presenting with clinical s/s of dysphagia as per this bedside swallow evaluation, however based on today's presentation and patient's history, suspect this is baseline esophageal dyspahgia. Patient is here in the hospital following a fall. She has h/o esophageal dysphagia that resulted in h/o food impaction in esophagus, h/o hiatal hernia which CXR completed during current hospitalization reported was "moderate to large". Patient was receptive to eating some of her breakfast meal and after SLP provided assistance with cutting her pancake up, patient able to feed self. She consumed approximately one pancake and 4-6 ounces of liquids (coffee, juice). She then told SLP she felt full. When drinking sips of thin consistency coffee, no overt s/s aspiration observed but at end of meal when patient taking sips of thin consistency juice, she did exhibit a few instances of cough. In addition, she started to exhibit expiratory wheeze and told SLP that she was tired out. Her symptoms are consistent with esophageal dysphagia and with having moderate to large hiatal hernia. SLP not recommending further skilled intervention at this time however recommnending SLP f/u at SNF to ensure no modifications needed to her PO consistencies. SLP Visit Diagnosis: Dysphagia, unspecified (R13.10)    Aspiration Risk  Mild aspiration risk    Diet Recommendation Regular;Thin liquid    Liquid Administration via: Cup;Straw Medication Administration: Other (Comment) (as tolerated) Supervision: Patient able to self feed;Intermittent supervision to cue for compensatory strategies Compensations: Slow  rate;Small sips/bites Postural Changes: Seated upright at 90 degrees;Remain upright for at least 30 minutes after po intake    Other  Recommendations Oral Care Recommendations: Oral care BID    Recommendations for follow up therapy are one component of a  multi-disciplinary discharge planning process, led by the attending physician.  Recommendations may be updated based on patient status, additional functional criteria and insurance authorization.  Follow up Recommendations Skilled nursing-short term rehab (<3 hours/day)      Assistance Recommended at Discharge    Functional Status Assessment Patient has not had a recent decline in their functional status  Frequency and Duration min 1 x/week          Prognosis   N/A     Swallow Study   General Date of Onset: 07/16/23 HPI: Patient is a 89 y.o. female with PMH: hiatal hernia, esophageal dysphagia with h/o food impaction in esophagus, depression, mild memory issues/forgetful (not formally diagnosed with dementia), HLD, prior left hip fracture s/p arthroplasty, traumatic pelvic fracture who presented to the hospital on 07/12/23 after a fall on her left side.  In ED, vitals were stable but BP was high. She underwent CXR imaging which showed moderate to large hiatal hernia, emphysema. MD concerned for increased confusion on 1/25 but UA negative except for 1 drop in hemoglobin. Type of Study: Bedside Swallow Evaluation Previous Swallow Assessment: BSE in 2023 Diet Prior to this Study: Regular;Thin liquids (Level 0) Temperature Spikes Noted: No Respiratory Status: Room air History of Recent Intubation: No Behavior/Cognition: Alert;Cooperative;Pleasant mood;Confused Oral Cavity Assessment: Within Functional Limits Oral Care Completed by SLP: No Oral Cavity - Dentition: Adequate natural dentition Vision: Functional for self-feeding Self-Feeding Abilities: Able to feed self;Needs set up Patient Positioning: Upright in bed Baseline Vocal Quality: Normal Volitional Cough: Cognitively unable to elicit Volitional Swallow: Unable to elicit    Oral/Motor/Sensory Function Overall Oral Motor/Sensory Function: Within functional limits   Ice Chips     Thin Liquid Thin Liquid: Impaired Presentation:  Cup Pharyngeal  Phase Impairments: Cough - Delayed Other Comments: delayed coughing with cranberry juice (thin consistency) at end of meal but not with thin liquids (coffee) from beginning of meal    Nectar Thick     Honey Thick     Puree Puree: Not tested   Solid     Solid: Within functional limits Presentation: Self Fed      Angela Nevin, MA, CCC-SLP Speech Therapy

## 2023-07-18 NOTE — Discharge Summary (Signed)
Physician Discharge Summary  Evelyn [name] ZOX:[id number] DOB: 07/16/1924 DOA: 07/12/2023  PCP: Patient, No Pcp Per  Admit date: 07/12/2023 Discharge date: 07/18/2023 Recommendations for Outpatient Follow-up:  Follow up with PCP in 1 weeks-call for appointment Please obtain BMP/CBC in one week  Discharge Dispo: SNF Discharge Condition: Stable Code Status:   Code Status: Limited: Do not attempt resuscitation (DNR) -DNR-LIMITED -Do Not Intubate/DNI  Diet recommendation:  Diet Order             Diet regular Room service appropriate? Yes; Fluid consistency: Thin  Diet effective now                   Brief/Interim Summary: 89 y.o. from assisted living facility female with medical history significant for depression, hyperlipidemia and prior left hip fracture and arthroplasty, traumatic pelvic fracture presented after a fall on the way to the bathroom onto her left side.  Denies hitting her head, losing consciousness, or any recent illness or other complaints. In the ED afebrile vitals fairly stable although BP initially high.  Labs with mild hyponatremia BUN 38 stable creatinine bicarb 22 leukocytosis 12.9 hemoglobin anemic 10.9 g. Underwent imaging with x-ray chest, lumbar spine hip> moderate to large hiatal hernia, emphysema osteopenia diffuse moderate chronic wedge compression fracture T12 L3 L2, old left hip replacement noted, acute periprosthetic fracture at the base of the lesser trochanter of the left femur Dr. Ophelia Charter of orthopedic surger consulted and admitted for further management. Patient continued on conservative management PT OT.  Hospitalization complicated by lethargy and delirium in the setting of UTI poor oral intake narcotics and hospitalization.  Hydrocodone discontinued, treated with IV antibiotics-urine culture with Proteus sensitive ampicillin/Keflex, oral intake picking up overall much more alert awake and at this time plan is for skilled nursing once bed available.    Discharge Diagnoses:  Principal Problem:   Bilateral acute nondisplaced hip fracture (HCC) Active Problems:   Closed displaced fracture of lesser trochanter of left femur (HCC)   Palliative care by specialist  Periprosthetic fracture of lesser trochanter on left Mechanical fall Increased risk for dislocation of her hip: S/P reduction of hip. Previous left hip hemiarthroplasty.  Orthopedics consulted -if she develops instability of the prosthesis subsides then surgery may be necessary advised to continue PT OT mobilize,50% weightbearing.  Continue Tylenol and lidocaine pain medication avoid narcotics as she is very prone for delirium.    AKI: Due to poor oral intake, resolved with IV fluids tolerating p.o. Recent Labs    12/25/22 0135 12/28/22 0433 03/21/23 0951 07/12/23 0728 07/13/23 0446 07/14/23 0440 07/15/23 0432 07/16/23 1015 07/17/23 0516 07/18/23 0437  BUN 21 24* 18 30* 41* 38* 29* 26* 23 22  CREATININE 0.95 1.01* 0.94 0.82 1.23* 1.33* 0.87 0.85 0.68 0.87  CO2 19* 20* 22 20* 22 18* 19* 19* 17* 19*  K 3.9 3.6 4.5 4.0 4.4 4.4 3.8 3.5 3.3* 3.5    Leukocytosis: ?reactive on admission.Resolved.   Macrocytic anemia:appears chronic checked anemia panel-stable.  Monitor hemoglobin watch for bleeding Recent Labs    07/12/23 0728 07/13/23 0446 07/16/23 1015 07/17/23 0516 07/18/23 0437  HGB 10.9* 10.4* 8.9* 8.6* 8.6*  MCV 98.0 100.6* 99.6 101.5* 100.7*  VITAMINB12  --  387 580  --   --   FOLATE  --  >40.0  --   --   --   FERRITIN  --  45  --   --   --   TIBC  --  370  --   --   --  IRON  --  35  --   --   --   RETICCTPCT  --  2.0  --   --   --    Mild memory issues/forgetful at baseline-not diagnosed with dementia formerly Anxiety disorder on chronic BuSpar Prozac and Ambien at home  Acute metabolic encephalopathy/delirium possible UTI versus hospitalization hip fracture pain medication induced: Worsening of mental status likely multifactorial in the setting of  pain medication UTI hospitalization. B12 AMMONIA fairly okay.  Mentation better and much improved tolerating p.o. well much more alert awake.   Avoid hydrocodone, she will resume her home meds upon discharge continue with PT OT at the facility If has ongoing issues consider holding BuSpar at the facility  Proteus UTI: Based on sensitivity we will discharge her on oral amoxicillin   Hiatal hernia moderate to large: Continue PPI Chronic wedge compression fracture: Chronic.  Cont Pain management. Emphysema of lung: Asymptomatic.add IS. Diffuse osteopenia: resume vitamin D GERD: cont PPI  GOC: DNR Peognosis guarded , however SHE remains stable at this time she will continue DNR, advised outpatient palliative care evaluation Follow-up  Consults: PMT Subjective: Alert awake pleasant, tolerating p.o. well no complaints  Discharge Exam: Vitals:   07/17/23 2126 07/18/23 0542  BP: (!) 145/74 (!) 158/63  Pulse: 88 80  Resp: 14 14  Temp: 98.4 F (36.9 C) 98.4 F (36.9 C)  SpO2: 95% 96%   General: Pt is alert, awake, not in acute distress Cardiovascular: RRR, S1/S2 +, no rubs, no gallops Respiratory: CTA bilaterally, no wheezing, no rhonchi Abdominal: Soft, NT, ND, bowel sounds + Extremities: no edema, no cyanosis  Discharge Instructions  Discharge Instructions     Discharge instructions   Complete by: As directed    Please call call MD or return to ER for similar or worsening recurring problem that brought you to hospital or if any fever,nausea/vomiting,abdominal pain, uncontrolled pain, chest pain,  shortness of breath or any other alarming symptoms.  Please follow-up your doctor as instructed in a week time and call the office for appointment.  Please avoid alcohol, smoking, or any other illicit substance and maintain healthy habits including taking your regular medications as prescribed.  You were cared for by a hospitalist during your hospital stay. If you have any  questions about your discharge medications or the care you received while you were in the hospital after you are discharged, you can call the unit and ask to speak with the hospitalist on call if the hospitalist that took care of you is not available.  Once you are discharged, your primary care physician will handle any further medical issues. Please note that NO REFILLS for any discharge medications will be authorized once you are discharged, as it is imperative that you return to your primary care physician (or establish a relationship with a primary care physician if you do not have one) for your aftercare needs so that they can reassess your need for medications and monitor your lab values   Increase activity slowly   Complete by: As directed       Allergies as of 07/18/2023   No Known Allergies      Medication List     STOP taking these medications    HYDROcodone-acetaminophen 5-325 MG tablet Commonly known as: NORCO/VICODIN       TAKE these medications    acetaminophen 500 MG tablet Commonly known as: TYLENOL Take 500 mg by mouth in the morning and at bedtime.   amoxicillin 500  MG capsule Commonly known as: AMOXIL Take 1 capsule (500 mg total) by mouth every 12 (twelve) hours for 4 days. Start taking on: July 19, 2023   aspirin EC 81 MG tablet Take 81 mg by mouth daily.   AZO CRANBERRY PO Take 1 tablet by mouth daily.   BAZA PROTECT EX Apply 1 application  topically 2 (two) times daily. To buttocks   busPIRone 5 MG tablet Commonly known as: BUSPAR Take 5 mg by mouth 2 (two) times daily.   cholecalciferol 25 MCG (1000 UNIT) tablet Commonly known as: VITAMIN D3 Take 1,000 Units by mouth daily.   DAILY VITE PO Take 1 tablet by mouth daily.   FLUoxetine 40 MG capsule Commonly known as: PROZAC Take 40 mg by mouth daily.   folic acid 1 MG tablet Commonly known as: FOLVITE Take 1 mg by mouth in the morning.   lidocaine 4 % Place 1 patch onto the skin  daily. To lower back. 12 hours on and 12 hours off   ondansetron 4 MG tablet Commonly known as: ZOFRAN Take 4 mg by mouth every 8 (eight) hours as needed for nausea or vomiting.   OVER THE COUNTER MEDICATION Take 1 tablet by mouth in the morning and at bedtime. QC stool softner/laxative   pantoprazole 40 MG tablet Commonly known as: PROTONIX Take 40 mg by mouth in the morning.   polyethylene glycol 17 g packet Commonly known as: MIRALAX / GLYCOLAX Take 17 g by mouth daily as needed for mild constipation, moderate constipation or severe constipation. What changed: Another medication with the same name was removed. Continue taking this medication, and follow the directions you see here.   PROBIOTIC FORMULA PO Take 1 capsule by mouth in the morning.   zolpidem 5 MG tablet Commonly known as: AMBIEN Take 1 tablet (5 mg total) by mouth at bedtime.        Contact information for after-discharge care     Destination     [facility]erred [facility] .   Service: Skilled Nursing Contact information: [address] [place] [address] [phone]                    No Known Allergies  The results of significant diagnostics from this hospitalization (including imaging, microbiology, ancillary and laboratory) are listed below for reference.    Microbiology: Recent Results (from the past 240 hours)  Urine Culture (for pregnant, neutropenic or urologic patients or patients with an indwelling urinary catheter)     Status: Abnormal   Collection Time: 07/15/23  3:24 PM   Specimen: In/Out Cath Urine  Result Value Ref Range Status   Specimen Description   Final    IN/OUT CATH URINE Performed at [facility], [address], [place], [place] [address]    Special Requests   Final    NONE Performed at [facility], [address] [place], [place] [address]    Culture >=100,000  COLONIES/mL PROTEUS MIRABILIS (A)  Final   Report Status 07/17/2023 FINAL  Final   Organism ID, Bacteria PROTEUS MIRABILIS (A)  Final      Susceptibility   Proteus mirabilis - MIC*    AMPICILLIN <=2 SENSITIVE Sensitive     CEFAZOLIN 8 SENSITIVE Sensitive     CEFEPIME <=0.12 SENSITIVE Sensitive     CEFTRIAXONE <=0.25 SENSITIVE Sensitive     CIPROFLOXACIN <=0.25 SENSITIVE Sensitive     GENTAMICIN <=1 SENSITIVE  Sensitive     IMIPENEM 2 SENSITIVE Sensitive     NITROFURANTOIN 128 RESISTANT Resistant     TRIMETH/SULFA <=20 SENSITIVE Sensitive     AMPICILLIN/SULBACTAM <=2 SENSITIVE Sensitive     PIP/TAZO <=4 SENSITIVE Sensitive ug/mL    * >=100,000 COLONIES/mL PROTEUS MIRABILIS    Procedures/Studies: DG Hip Unilat W or Wo Pelvis 2-3 Views Left Result Date: 07/12/2023 CLINICAL DATA:  Left hip pain.  Fall injury.  Low back pain. EXAM: DG HIP (WITH OR WITHOUT PELVIS) 2-3V LEFT; CHEST 1 VIEW; LUMBAR SPINE - COMPLETE 4+ VIEW COMPARISON:  CT abdomen pelvis without contrast 04/03/2023, AP chest 12/23/2022, lumbar spine series 12/17/2021, left hip series and AP pelvis 12/23/2022 and CT pelvis 12/23/2022. FINDINGS: Chest AP portable 6:41 a.m.: Moderate-to-large sized hiatal hernia. Stable mediastinum. Aortic atherosclerosis. The cardiac size is normal.  The lungs are emphysematous but clear. There are multiple healed bilateral ribcage fracture deformities and osteopenia. Slight thoracic dextroscoliosis.  No acute osseous findings. Lumbar spine series routine 5 views: There is diffuse osteopenia. There is a moderate chronic wedge compression fracture deformity of T12, mild-to-moderate chronic height loss of L3, slight wedging of L2. No new or worsening compression fracture is seen. Grade 1 retrolisthesis also is unchanged T12-L1 and L1-L2 and grade 1 anterolisthesis at L2-3 and L5-S1, all believed degenerative. There is mild levoscoliosis as well, with spinous process abutment at L2-5. There is mild lumbar  spondylosis. The discs are diffusely degenerative. There is facet hypertrophy moderately from L2-3 down greatest at the lowest 2 levels. AP pelvis and AP and frog-leg left hip: Old left hip replacement. There is acute longitudinal perihardware fracture through the base of the lesser trochanter with medial displacement of the fragment. There is osteopenia with no other visible fractures. Advanced arthrosis noted right hip joint. There are healed fracture deformities of the bilateral pubic rami. Chronic postsurgical changes are again noted in the pelvic floor. Right-sided pelvic phleboliths. IMPRESSION: 1. Acute longitudinal perihardware fracture through the base of the lesser trochanter of the left femur with medial displacement of the fragment. 2. Osteopenia and degenerative change without evidence of acute lumbar spine fractures. 3. Chronic compression fracture deformities of T12, L3, and L2. 4. Mild thoracic dextroscoliosis and mild lumbar levoscoliosis. 5. Moderate-to-large sized hiatal hernia. 6. Emphysema. 7. Aortic atherosclerosis. 8. Healed fracture deformities of the bilateral pubic rami. Electronically Signed   By: Almira Bar M.D.   On: 07/12/2023 07:54   DG Lumbar Spine Complete Result Date: 07/12/2023 CLINICAL DATA:  Left hip pain.  Fall injury.  Low back pain. EXAM: DG HIP (WITH OR WITHOUT PELVIS) 2-3V LEFT; CHEST 1 VIEW; LUMBAR SPINE - COMPLETE 4+ VIEW COMPARISON:  CT abdomen pelvis without contrast 04/03/2023, AP chest 12/23/2022, lumbar spine series 12/17/2021, left hip series and AP pelvis 12/23/2022 and CT pelvis 12/23/2022. FINDINGS: Chest AP portable 6:41 a.m.: Moderate-to-large sized hiatal hernia. Stable mediastinum. Aortic atherosclerosis. The cardiac size is normal.  The lungs are emphysematous but clear. There are multiple healed bilateral ribcage fracture deformities and osteopenia. Slight thoracic dextroscoliosis.  No acute osseous findings. Lumbar spine series routine 5 views: There  is diffuse osteopenia. There is a moderate chronic wedge compression fracture deformity of T12, mild-to-moderate chronic height loss of L3, slight wedging of L2. No new or worsening compression fracture is seen. Grade 1 retrolisthesis also is unchanged T12-L1 and L1-L2 and grade 1 anterolisthesis at L2-3 and L5-S1, all believed degenerative. There is mild levoscoliosis as well, with spinous process abutment at L2-5.  There is mild lumbar spondylosis. The discs are diffusely degenerative. There is facet hypertrophy moderately from L2-3 down greatest at the lowest 2 levels. AP pelvis and AP and frog-leg left hip: Old left hip replacement. There is acute longitudinal perihardware fracture through the base of the lesser trochanter with medial displacement of the fragment. There is osteopenia with no other visible fractures. Advanced arthrosis noted right hip joint. There are healed fracture deformities of the bilateral pubic rami. Chronic postsurgical changes are again noted in the pelvic floor. Right-sided pelvic phleboliths. IMPRESSION: 1. Acute longitudinal perihardware fracture through the base of the lesser trochanter of the left femur with medial displacement of the fragment. 2. Osteopenia and degenerative change without evidence of acute lumbar spine fractures. 3. Chronic compression fracture deformities of T12, L3, and L2. 4. Mild thoracic dextroscoliosis and mild lumbar levoscoliosis. 5. Moderate-to-large sized hiatal hernia. 6. Emphysema. 7. Aortic atherosclerosis. 8. Healed fracture deformities of the bilateral pubic rami. Electronically Signed   By: Almira Bar M.D.   On: 07/12/2023 07:54   DG Chest 1 View Result Date: 07/12/2023 CLINICAL DATA:  Left hip pain.  Fall injury.  Low back pain. EXAM: DG HIP (WITH OR WITHOUT PELVIS) 2-3V LEFT; CHEST 1 VIEW; LUMBAR SPINE - COMPLETE 4+ VIEW COMPARISON:  CT abdomen pelvis without contrast 04/03/2023, AP chest 12/23/2022, lumbar spine series 12/17/2021, left hip  series and AP pelvis 12/23/2022 and CT pelvis 12/23/2022. FINDINGS: Chest AP portable 6:41 a.m.: Moderate-to-large sized hiatal hernia. Stable mediastinum. Aortic atherosclerosis. The cardiac size is normal.  The lungs are emphysematous but clear. There are multiple healed bilateral ribcage fracture deformities and osteopenia. Slight thoracic dextroscoliosis.  No acute osseous findings. Lumbar spine series routine 5 views: There is diffuse osteopenia. There is a moderate chronic wedge compression fracture deformity of T12, mild-to-moderate chronic height loss of L3, slight wedging of L2. No new or worsening compression fracture is seen. Grade 1 retrolisthesis also is unchanged T12-L1 and L1-L2 and grade 1 anterolisthesis at L2-3 and L5-S1, all believed degenerative. There is mild levoscoliosis as well, with spinous process abutment at L2-5. There is mild lumbar spondylosis. The discs are diffusely degenerative. There is facet hypertrophy moderately from L2-3 down greatest at the lowest 2 levels. AP pelvis and AP and frog-leg left hip: Old left hip replacement. There is acute longitudinal perihardware fracture through the base of the lesser trochanter with medial displacement of the fragment. There is osteopenia with no other visible fractures. Advanced arthrosis noted right hip joint. There are healed fracture deformities of the bilateral pubic rami. Chronic postsurgical changes are again noted in the pelvic floor. Right-sided pelvic phleboliths. IMPRESSION: 1. Acute longitudinal perihardware fracture through the base of the lesser trochanter of the left femur with medial displacement of the fragment. 2. Osteopenia and degenerative change without evidence of acute lumbar spine fractures. 3. Chronic compression fracture deformities of T12, L3, and L2. 4. Mild thoracic dextroscoliosis and mild lumbar levoscoliosis. 5. Moderate-to-large sized hiatal hernia. 6. Emphysema. 7. Aortic atherosclerosis. 8. Healed fracture  deformities of the bilateral pubic rami. Electronically Signed   By: Almira Bar M.D.   On: 07/12/2023 07:54    Labs: BNP (last 3 results) No results for input(s): "BNP" in the last 8760 hours. Basic Metabolic Panel: Recent Labs  Lab 07/14/23 0440 07/15/23 0432 07/16/23 1015 07/17/23 0516 07/18/23 0437  NA 134* 134* 136 137 138  K 4.4 3.8 3.5 3.3* 3.5  CL 105 107 109 111 111  CO2 18* 19* 19*  17* 19*  GLUCOSE 108* 123* 105* 108* 110*  BUN 38* 29* 26* 23 22  CREATININE 1.33* 0.87 0.85 0.68 0.87  CALCIUM 8.8* 8.2* 8.8* 8.4* 8.6*   Liver Function Tests: No results for input(s): "AST", "ALT", "ALKPHOS", "BILITOT", "PROT", "ALBUMIN" in the last 168 hours. No results for input(s): "LIPASE", "AMYLASE" in the last 168 hours. Recent Labs  Lab 07/16/23 1015  AMMONIA 15  CBC: Recent Labs  Lab 07/12/23 0728 07/13/23 0446 07/16/23 1015 07/17/23 0516 07/18/23 0437  WBC 12.9* 8.3 8.3 7.8 6.4  NEUTROABS 10.6*  --   --   --   --   HGB 10.9* 10.4* 8.9* 8.6* 8.6*  HCT 34.4* 32.8* 28.0* 27.1* 27.5*  MCV 98.0 100.6* 99.6 101.5* 100.7*  PLT 215 202 200 206 207   Recent Labs    07/16/23 1015  VITAMINB12 580   Urinalysis    Component Value Date/Time   COLORURINE YELLOW 07/15/2023 1513   APPEARANCEUR CLOUDY (A) 07/15/2023 1513   LABSPEC 1.013 07/15/2023 1513   PHURINE 8.0 07/15/2023 1513   GLUCOSEU NEGATIVE 07/15/2023 1513   GLUCOSEU NEGATIVE 02/01/2017 1212   HGBUR SMALL (A) 07/15/2023 1513   BILIRUBINUR NEGATIVE 07/15/2023 1513   KETONESUR 5 (A) 07/15/2023 1513   PROTEINUR 30 (A) 07/15/2023 1513   UROBILINOGEN 1.0 02/01/2017 1212   NITRITE NEGATIVE 07/15/2023 1513   LEUKOCYTESUR LARGE (A) 07/15/2023 1513   Sepsis Labs Recent Labs  Lab 07/13/23 0446 07/16/23 1015 07/17/23 0516 07/18/23 0437  WBC 8.3 8.3 7.8 6.4   Microbiology Recent Results (from the past 240 hours)  Urine Culture (for pregnant, neutropenic or urologic patients or patients with an indwelling  urinary catheter)     Status: Abnormal   Collection Time: 07/15/23  3:24 PM   Specimen: In/Out Cath Urine  Result Value Ref Range Status   Specimen Description   Final    IN/OUT CATH URINE Performed at [facility], [address], [place], [place] [address]    Special Requests   Final    NONE Performed at [facility], [address], [place], [place] [address]    Culture >=100,000 COLONIES/mL PROTEUS MIRABILIS (A)  Final   Report Status 07/17/2023 FINAL  Final   Organism ID, Bacteria PROTEUS MIRABILIS (A)  Final      Susceptibility   Proteus mirabilis - MIC*    AMPICILLIN <=2 SENSITIVE Sensitive     CEFAZOLIN 8 SENSITIVE Sensitive     CEFEPIME <=0.12 SENSITIVE Sensitive     CEFTRIAXONE <=0.25 SENSITIVE Sensitive     CIPROFLOXACIN <=0.25 SENSITIVE Sensitive     GENTAMICIN <=1 SENSITIVE Sensitive     IMIPENEM 2 SENSITIVE Sensitive     NITROFURANTOIN 128 RESISTANT Resistant     TRIMETH/SULFA <=20 SENSITIVE Sensitive     AMPICILLIN/SULBACTAM <=2 SENSITIVE Sensitive     PIP/TAZO <=4 SENSITIVE Sensitive ug/mL    * >=100,000 COLONIES/mL PROTEUS MIRABILIS    Time coordinating discharge: 25 minutes  SIGNED: Lanae Boast, MD  [facility] 07/18/2023, 10:54 AM  If 7PM-7AM, please contact night-coverage [email or web address]

## 2023-07-26 ENCOUNTER — Ambulatory Visit (INDEPENDENT_AMBULATORY_CARE_PROVIDER_SITE_OTHER): Payer: Medicare Other | Admitting: Orthopaedic Surgery

## 2023-07-26 ENCOUNTER — Encounter: Payer: Self-pay | Admitting: Orthopaedic Surgery

## 2023-07-26 ENCOUNTER — Other Ambulatory Visit (INDEPENDENT_AMBULATORY_CARE_PROVIDER_SITE_OTHER): Payer: Self-pay

## 2023-07-26 VITALS — BP 100/64 | HR 94

## 2023-07-26 DIAGNOSIS — S72002A Fracture of unspecified part of neck of left femur, initial encounter for closed fracture: Secondary | ICD-10-CM

## 2023-07-26 DIAGNOSIS — M25552 Pain in left hip: Secondary | ICD-10-CM | POA: Diagnosis not present

## 2023-07-26 NOTE — Progress Notes (Signed)
 Office Visit Note   Patient: Evelyn [name]           Date of Birth: 09-26-1924           MRN: [id number] Visit Date: 07/26/2023              Requested by: No referring provider defined for this encounter. PCP: Patient, No Pcp Per   Assessment & Plan: Visit Diagnoses:  1. Pain in left hip   2. Closed fracture of neck of left femur, initial encounter (HCC)        Periprothetic lesser troch fracture post hemiarthroplasty  Plan: Patient with poor bone quality at age 89.  She has had a periprosthetic fracture and the only option would be revision surgery which would be at increased risk and with her history of pneumonia shortness of breath recently been placed on Lasix and potassium and overall weakness I think she would be minimal improvement and mobility even after revision surgery went successfully either placing a larger stem or cementing and cabling with periprosthetic fracture plate.  Went over this with patient and also patient's daughter and also patient's son is on telephone.  Poke up.  I will recheck her in 5 weeks repeat x-rays on return.  Plan would be to discontinue knee immobilizer.  Weightbearing efforts just to transfer wheelchair to bed using a side board with nonweightbearing in the left lower extremity.  Repeat x-rays in 5 weeks.  Follow-Up Instructions: Return in about 5 weeks (around 08/30/2023).   Orders:  Orders Placed This Encounter  Procedures   XR HIP UNILAT W OR W/O PELVIS 2-3 VIEWS LEFT   No orders of the defined types were placed in this encounter.     Procedures: No procedures performed   Clinical Data: No additional findings.   Subjective: Chief Complaint  Patient presents with   Left Hip - Fracture    Fall 07/12/2023    HPI patient returns had previous hip Hemi surgery June 2023.  She is now 89 or daughter mentioned that they have come up with a recent diagnosis of some heart failure.  She has been 88% weightbearing with therapy and routine  follow-up shows that the femoral stem is further subsided.  Patient's had a assisted living facility.  Some of the clients there have someone of system back-and-forth by wheelchair.  Patient is only been able to do minimal progress with therapy efforts at 88% weightbearing.  Review of Systems unchanged   Objective: Vital Signs: BP 100/64   Pulse 94   Physical Exam Constitutional:      Appearance: She is well-developed.  HENT:     Head: Normocephalic.     Right Ear: External ear normal.     Left Ear: External ear normal. There is no impacted cerumen.  Eyes:     Pupils: Pupils are equal, round, and reactive to light.  Neck:     Thyroid : No thyromegaly.     Trachea: No tracheal deviation.  Cardiovascular:     Rate and Rhythm: Normal rate.  Pulmonary:     Effort: Pulmonary effort is normal.  Abdominal:     Palpations: Abdomen is soft.  Musculoskeletal:     Cervical back: No rigidity.  Skin:    General: Skin is warm and dry.  Neurological:     Mental Status: She is alert and oriented to person, place, and time.  Psychiatric:        Behavior: Behavior normal.     Ortho  Exam  Specialty Comments:  No specialty comments available.  Imaging: XR HIP UNILAT W OR W/O PELVIS 2-3 VIEWS LEFT Result Date: 07/26/2023 AP and frog-leg left hip obtained and reviewed.  This shows again lesser trochanter fracture and femoral stem is subsided another 2 cm down the canal with stem instability.  Hip joint is well located without subluxation. Impression: Periprosthetic fracture with continued distal migration of the stem in the left femoral canal.    PMFS History: Patient Active Problem List   Diagnosis Date Noted   Palliative care by specialist 07/17/2023   Closed displaced fracture of lesser trochanter of left femur (HCC) 07/13/2023   Closed fracture of ramus of right pubis (HCC) 12/24/2022   History of bilateral hip arthroplasty 12/23/2022   Lower lobe pneumonia 04/03/2022   Debility  04/03/2022   Pyuria 12/18/2021   Fever 12/18/2021   Macrocytic anemia 12/18/2021   Malnutrition of moderate degree 12/18/2021   Fracture of femoral neck, left (HCC) 12/17/2021   Bilateral acute nondisplaced hip fracture (HCC) 12/17/2021   Delirium 11/09/2021   Essential hypertension 11/08/2021   Hypokalemia 11/08/2021   Compression fracture of body of thoracic vertebra (HCC) 11/06/2021   Acute anemia 11/06/2021   Cognitive impairment 11/06/2021   Lactic acidosis 11/06/2021   Black stools    Heme + stool    Peptic ulcer disease    Acute metabolic encephalopathy 11/05/2021   Closed wedge compression fracture of T12 vertebra (HCC) 11/24/2016   Chronic midline low back pain 11/18/2016   Fatigue 11/18/2016   CKD stage 3a, GFR 45-59 ml/min (HCC) 11/18/2016   IBS (irritable bowel syndrome)    UTI (urinary tract infection)    AKI (acute kidney injury) (HCC) 01/30/2015   Routine general medical examination at a health care facility 05/03/2013   Chronic idiopathic constipation 03/31/2012   Other screening mammogram 03/24/2011   Depression 03/06/2010   Insomnia 03/08/2008   HLD (hyperlipidemia) 02/21/2007   Past Medical History:  Diagnosis Date   Allergy    Arthritis    Chronic cystitis    Congenital blindness    right eye   Depression    Gastric ulcer    H/O: hematuria    Hepatitis, unspecified    statin related, resolved quickly   Hyperlipidemia    Legally blind in right eye, as defined in [place]     Syncope and collapse 04/01/2014   UTI (lower urinary tract infection)    HISTORY OF UTI    Family History  Problem Relation Age of Onset   Cancer Neg Hx        Colon cancer   Stroke Neg Hx    Heart disease Neg Hx    Hyperlipidemia Neg Hx    Hypertension Neg Hx    Kidney disease Neg Hx    Colon polyps Neg Hx    Colon cancer Neg Hx     Past Surgical History:  Procedure Laterality Date   ABDOMINAL HYSTERECTOMY     vaginal   BLADDER SUSPENSION     A-P   CATARACT  EXTRACTION W/ INTRAOCULAR LENS IMPLANT Left    ESOPHAGOGASTRODUODENOSCOPY     HIP ARTHROPLASTY Left 12/17/2021   Procedure: ARTHROPLASTY BIPOLAR HIP (HEMIARTHROPLASTY);  Surgeon: Barbarann Oneil BROCKS, MD;  Location: [facility];  Service: Orthopedics;  Laterality: Left;   OPEN REDUCTION INTERNAL FIXATION (ORIF) DISTAL RADIAL FRACTURE Right 03/09/2016   Procedure: OPEN REDUCTION INTERNAL FIXATION (ORIF) DISTAL RADIAL FRACTURE;  Surgeon: Franky Curia, MD;  Location: [facility];  Service: Orthopedics;  Laterality: Right;   Social History   Occupational History   Occupation: Retired  Tobacco Use   Smoking status: Never   Smokeless tobacco: Never  Vaping Use   Vaping status: Never Used  Substance and Sexual Activity   Alcohol use: No   Drug use: No   Sexual activity: Not Currently

## 2023-08-01 ENCOUNTER — Telehealth: Payer: Self-pay | Admitting: Orthopaedic Surgery

## 2023-08-01 NOTE — Telephone Encounter (Signed)
 Rehab director called today for pt. Pt had a visit on 2/4 and pt was told to take her off her soft knee immobilizer. The pt daughter Edwina Gram is requesting if the pt can continue wearing it for comfort. [facility] is requesting a call for conformation from Dr. Murrel Arnt. Please call phone:[phone] and ask for [facility]. Fax:[phone]

## 2023-08-02 ENCOUNTER — Telehealth: Payer: Self-pay | Admitting: Radiology

## 2023-08-02 NOTE — Telephone Encounter (Signed)
I called and spoke with Evelyn [name].  She states that the patient had been more comfortable in the knee immobilizer.  We discussed the position of her leg and the fact that her family members feel there is difference and her leg is curling in. She explained that she thinks this is due to the patient overcompensating in the bed without the immobilizer on and that she has explained to the family the reasons for no therapy and trying to give [name] more time to heal due to age, etc.  Evelyn [name] requests note be faxed to her stating patient may use immobilizer. Letter faxed.

## 2023-08-02 NOTE — Telephone Encounter (Addendum)
Received call from patient's son, Lu Duffel. He is in another state but has received a call from his sister who is concerned about their mom and the possibility of her needing surgery. He asked why she was not immediately scheduled for surgery as everything that he has researched says to do surgery within 48 hours. We discussed Dr. Ophelia Charter notes and the fact that at the time of xrays, the hip is reduced and there has been no subsidence of the stem. Per Dr. Ophelia Charter notes, patient has poor bone quality as well as other medical conditions that could have effect if surgery is performed.  Lu Duffel has asked if she can go back in the immobilizer. I explained Dr. Ophelia Charter is out of the office and that I have sent the message to one of his partners, Dr. Lajoyce Corners, to see if he can advise. Also explained that if the leg seems grossly different, we will need to see her in the office. Lu Duffel is going to try and reach his sister and call me back to let me know how patient is doing and whether we need to schedule appointment with someone else prior to Dr Ophelia Charter returning.

## 2023-08-03 ENCOUNTER — Ambulatory Visit: Payer: Medicare Other | Admitting: Obstetrics and Gynecology

## 2023-08-30 ENCOUNTER — Other Ambulatory Visit (INDEPENDENT_AMBULATORY_CARE_PROVIDER_SITE_OTHER): Payer: Self-pay

## 2023-08-30 ENCOUNTER — Encounter: Payer: Self-pay | Admitting: Orthopaedic Surgery

## 2023-08-30 ENCOUNTER — Ambulatory Visit (INDEPENDENT_AMBULATORY_CARE_PROVIDER_SITE_OTHER): Payer: Medicare Other | Admitting: Orthopaedic Surgery

## 2023-08-30 VITALS — BP 110/74 | HR 89

## 2023-08-30 DIAGNOSIS — S72002A Fracture of unspecified part of neck of left femur, initial encounter for closed fracture: Secondary | ICD-10-CM

## 2023-08-30 NOTE — Progress Notes (Addendum)
 Office Visit Note   Patient: Evelyn [name]           Date of Birth: [birth year]           MRN: [id number] Visit Date: 08/30/2023              Requested by: No referring provider defined for this encounter. PCP: Patient, No Pcp Per   Assessment & Plan: Visit Diagnoses:  1. Closed fracture of neck of left femur, initial encounter (HCC)     Plan: Patient had a periprosthetic fracture with shortening.  X-rays today shows a stable she is nonpainful.  With her history of multiple falls multiple fractures she is best to be nonweightbearing just transfer bed to wheelchair.  She can do exercising on her upper extremities or do arm activities at the assisted living or skilled living facility.  Discussed again with patient's daughter, patient and also patient's son by telephone that there is option for revision surgery with a longer stem periprosthetic plate with cables and screws but she likely would fall and with poor bone quality and history of multiple multiple falls and multiple fractures as listed above I would recommend bed to chair transfers.  She has shortness of breath even sitting in a wheelchair and even if she could successfully get to the surgery cardiopulmonary problems with laminar walking ability just a few steps due to her weakness.  Follow-up as needed with either Dr. Magnus Ivan or Dr.Xu or Dr. August Saucer.  Follow-Up Instructions: Return if symptoms worsen or fail to improve.   Orders:  Orders Placed This Encounter  Procedures   XR HIP UNILAT W OR W/O PELVIS 2-3 VIEWS LEFT   No orders of the defined types were placed in this encounter.     Procedures: No procedures performed   Clinical Data: No additional findings.   Subjective: Chief Complaint  Patient presents with   Left Hip - Fracture, Follow-up    Fall 07/12/2023    HPI 89 year old female with history of multiple falls she has had thoracic vertebral compression fractures rami fractures, pubic fractures, sacral  fractures.  Left femoral neck displaced fracture treated with hemiarthroplasty 2023.  Did well for a year and a half then other falls and progressive therapy led to shortening with periprosthetic fracture as the stem migrated down the canal.  The hip is reduced her therapy has been stopped and her hip no longer hurts.  Left lower extremity is now short but her hip is reduced on x-ray and is not painful.  Patient has a heart failure has some dyspnea and is history of repetitive UTIs which causes her to be weak and confused.  Patient is in a wheelchair and comfortable.  Review of Systems updated unchanged   Objective: Vital Signs: BP 110/74   Pulse 89   Physical Exam Constitutional:      Appearance: She is well-developed.  HENT:     Head: Normocephalic.     Right Ear: External ear normal.     Left Ear: External ear normal. There is no impacted cerumen.  Eyes:     Pupils: Pupils are equal, round, and reactive to light.  Neck:     Thyroid: No thyromegaly.     Trachea: No tracheal deviation.  Cardiovascular:     Rate and Rhythm: Normal rate.  Pulmonary:     Effort: Pulmonary effort is normal.  Abdominal:     Palpations: Abdomen is soft.  Musculoskeletal:     Cervical back: No rigidity.  Skin:    General: Skin is warm and dry.  Neurological:     Mental Status: She is alert and oriented to person, place, and time.  Psychiatric:        Behavior: Behavior normal.     Ortho Exam no pain with motion in the wheelchair internal/external rotation of the left hip.  She is a about an inch and half short in sitting position.  Specialty Comments:  No specialty comments available.  Imaging: XR HIP UNILAT W OR W/O PELVIS 2-3 VIEWS LEFT Result Date: 08/30/2023 X-rays left hip including AP pelvis frog-leg demonstrates periprosthetic fracture with shortening and stem migration down the canal.  Comparison to 07/26/2023 x-rays showed that there is no further subsidence of the stem. Assessment:  Left hemiarthroplasty periprosthetic fracture.  Hemiarthroplasty remains reduced in the acetabulum.  No further shortening.    PMFS History: Patient Active Problem List   Diagnosis Date Noted   Palliative care by specialist 07/17/2023   Closed displaced fracture of lesser trochanter of left femur (HCC) 07/13/2023   Closed fracture of ramus of right pubis (HCC) 12/24/2022   Lower lobe pneumonia 04/03/2022   Debility 04/03/2022   Pyuria 12/18/2021   Fever 12/18/2021   Macrocytic anemia 12/18/2021   Malnutrition of moderate degree 12/18/2021   Fracture of femoral neck, left (HCC) 12/17/2021   Bilateral acute nondisplaced hip fracture (HCC) 12/17/2021   Delirium 11/09/2021   Essential hypertension 11/08/2021   Hypokalemia 11/08/2021   Compression fracture of body of thoracic vertebra (HCC) 11/06/2021   Acute anemia 11/06/2021   Cognitive impairment 11/06/2021   Lactic acidosis 11/06/2021   Black stools    Heme + stool    Peptic ulcer disease    Acute metabolic encephalopathy 11/05/2021   Closed wedge compression fracture of T12 vertebra (HCC) 11/24/2016   Chronic midline low back pain 11/18/2016   Fatigue 11/18/2016   CKD stage 3a, GFR 45-59 ml/min (HCC) 11/18/2016   IBS (irritable bowel syndrome)    UTI (urinary tract infection)    AKI (acute kidney injury) (HCC) 01/30/2015   Routine general medical examination at a health care facility 05/03/2013   Chronic idiopathic constipation 03/31/2012   Other screening mammogram 03/24/2011   Depression 03/06/2010   Insomnia 03/08/2008   HLD (hyperlipidemia) 02/21/2007   Past Medical History:  Diagnosis Date   Allergy    Arthritis    Chronic cystitis    Congenital blindness    right eye   Depression    Gastric ulcer    H/O: hematuria    Hepatitis, unspecified    statin related, resolved quickly   Hyperlipidemia    Legally blind in right eye, as defined in [place]    Syncope and collapse 04/01/2014   UTI (lower urinary tract  infection)    HISTORY OF UTI    Family History  Problem Relation Age of Onset   Cancer Neg Hx        Colon cancer   Stroke Neg Hx    Heart disease Neg Hx    Hyperlipidemia Neg Hx    Hypertension Neg Hx    Kidney disease Neg Hx    Colon polyps Neg Hx    Colon cancer Neg Hx     Past Surgical History:  Procedure Laterality Date   ABDOMINAL HYSTERECTOMY     vaginal   BLADDER SUSPENSION     A-P   CATARACT EXTRACTION W/ INTRAOCULAR LENS IMPLANT Left    ESOPHAGOGASTRODUODENOSCOPY     HIP  ARTHROPLASTY Left 12/17/2021   Procedure: ARTHROPLASTY BIPOLAR HIP (HEMIARTHROPLASTY);  Surgeon: Eldred Manges, MD;  Location: [facility];  Service: Orthopedics;  Laterality: Left;   OPEN REDUCTION INTERNAL FIXATION (ORIF) DISTAL RADIAL FRACTURE Right 03/09/2016   Procedure: OPEN REDUCTION INTERNAL FIXATION (ORIF) DISTAL RADIAL FRACTURE;  Surgeon: Betha Loa, MD;  Location: [facility];  Service: Orthopedics;  Laterality: Right;   Social History   Occupational History   Occupation: Retired  Tobacco Use   Smoking status: Never   Smokeless tobacco: Never  Vaping Use   Vaping status: Never Used  Substance and Sexual Activity   Alcohol use: No   Drug use: No   Sexual activity: Not Currently

## 2023-10-25 ENCOUNTER — Ambulatory Visit: Admitting: Pulmonary Disease

## 2023-12-11 ENCOUNTER — Observation Stay (HOSPITAL_COMMUNITY)
Admission: EM | Admit: 2023-12-11 | Discharge: 2023-12-12 | Disposition: A | Discharge status: Home / Self Care | Attending: Family Medicine | Admitting: Family Medicine

## 2023-12-11 ENCOUNTER — Other Ambulatory Visit: Payer: Self-pay

## 2023-12-11 ENCOUNTER — Emergency Department (HOSPITAL_COMMUNITY)

## 2023-12-11 DIAGNOSIS — Z8781 Personal history of (healed) traumatic fracture: Secondary | ICD-10-CM | POA: Diagnosis not present

## 2023-12-11 DIAGNOSIS — G9341 Metabolic encephalopathy: Secondary | ICD-10-CM | POA: Insufficient documentation

## 2023-12-11 DIAGNOSIS — N390 Urinary tract infection, site not specified: Secondary | ICD-10-CM | POA: Diagnosis not present

## 2023-12-11 DIAGNOSIS — F32A Depression, unspecified: Secondary | ICD-10-CM | POA: Insufficient documentation

## 2023-12-11 DIAGNOSIS — E785 Hyperlipidemia, unspecified: Secondary | ICD-10-CM | POA: Diagnosis not present

## 2023-12-11 DIAGNOSIS — Z8673 Personal history of transient ischemic attack (TIA), and cerebral infarction without residual deficits: Secondary | ICD-10-CM | POA: Insufficient documentation

## 2023-12-11 DIAGNOSIS — K279 Peptic ulcer, site unspecified, unspecified as acute or chronic, without hemorrhage or perforation: Secondary | ICD-10-CM | POA: Insufficient documentation

## 2023-12-11 DIAGNOSIS — K219 Gastro-esophageal reflux disease without esophagitis: Secondary | ICD-10-CM | POA: Insufficient documentation

## 2023-12-11 DIAGNOSIS — R4182 Altered mental status, unspecified: Principal | ICD-10-CM

## 2023-12-11 DIAGNOSIS — Z7982 Long term (current) use of aspirin: Secondary | ICD-10-CM | POA: Diagnosis not present

## 2023-12-11 LAB — CBC WITH DIFFERENTIAL/PLATELET
Abs Immature Granulocytes: 0.03 10*3/uL (ref 0.00–0.07)
Basophils Absolute: 0.1 10*3/uL (ref 0.0–0.1)
Basophils Relative: 1 %
Eosinophils Absolute: 0.1 10*3/uL (ref 0.0–0.5)
Eosinophils Relative: 2 %
HCT: 39.8 % (ref 36.0–46.0)
Hemoglobin: 12.4 g/dL (ref 12.0–15.0)
Immature Granulocytes: 0 %
Lymphocytes Relative: 20 %
Lymphs Abs: 1.4 10*3/uL (ref 0.7–4.0)
MCH: 30.5 pg (ref 26.0–34.0)
MCHC: 31.2 g/dL (ref 30.0–36.0)
MCV: 98 fL (ref 80.0–100.0)
Monocytes Absolute: 0.8 10*3/uL (ref 0.1–1.0)
Monocytes Relative: 12 %
Neutro Abs: 4.6 10*3/uL (ref 1.7–7.7)
Neutrophils Relative %: 65 %
Platelets: 254 10*3/uL (ref 150–400)
RBC: 4.06 MIL/uL (ref 3.87–5.11)
RDW: 14.4 % (ref 11.5–15.5)
WBC: 7 10*3/uL (ref 4.0–10.5)
nRBC: 0 % (ref 0.0–0.2)

## 2023-12-11 LAB — URINALYSIS, ROUTINE W REFLEX MICROSCOPIC
Bilirubin Urine: NEGATIVE
Glucose, UA: NEGATIVE mg/dL
Hgb urine dipstick: NEGATIVE
Ketones, ur: NEGATIVE mg/dL
Nitrite: NEGATIVE
Protein, ur: NEGATIVE mg/dL
Specific Gravity, Urine: 1.021 (ref 1.005–1.030)
WBC, UA: >50 WBC/hpf (ref 0–5)
pH: 5 (ref 5.0–8.0)

## 2023-12-11 LAB — COMPREHENSIVE METABOLIC PANEL WITH GFR
ALT: 25 U/L (ref 0–44)
AST: 43 U/L — ABNORMAL HIGH (ref 15–41)
Albumin: 3.4 g/dL — ABNORMAL LOW (ref 3.5–5.0)
Alkaline Phosphatase: 76 U/L (ref 38–126)
Anion gap: 8 (ref 5–15)
BUN: 31 mg/dL — ABNORMAL HIGH (ref 8–23)
CO2: 22 mmol/L (ref 22–32)
Calcium: 9.2 mg/dL (ref 8.9–10.3)
Chloride: 108 mmol/L (ref 98–111)
Creatinine, Ser: 0.99 mg/dL (ref 0.44–1.00)
GFR, Estimated: 52 mL/min — ABNORMAL LOW (ref >60)
Glucose, Bld: 96 mg/dL (ref 70–99)
Potassium: 4.1 mmol/L (ref 3.5–5.1)
Sodium: 138 mmol/L (ref 135–145)
Total Bilirubin: 0.7 mg/dL (ref 0.0–1.2)
Total Protein: 7.5 g/dL (ref 6.5–8.1)

## 2023-12-11 LAB — CBG MONITORING, ED: Glucose-Capillary: 93 mg/dL (ref 70–99)

## 2023-12-11 LAB — AMMONIA: Ammonia: 18 umol/L (ref 9–35)

## 2023-12-11 MED ORDER — SODIUM CHLORIDE 0.9 % IV SOLN
1.0000 g | Freq: Once | INTRAVENOUS | Status: AC
  Administered 2023-12-11: 1 g via INTRAVENOUS
  Filled 2023-12-11: qty 10

## 2023-12-11 NOTE — ED Triage Notes (Signed)
 Pt BIB [facility] from [facility]. Pt and family c/o AMS and lethargy. Pt recently seen for UTI and AMS. Urine results still pending.  97% RA 133/68 70HR

## 2023-12-11 NOTE — ED Provider Notes (Signed)
 [place] EMERGENCY DEPARTMENT AT [facility] Provider Note   CSN: [id number] Arrival date & time: 12/11/23  2039     Patient presents with: Altered Mental Status   LENORE MOYANO is a 89 y.o. female.   HPI   89 year old female presents from [facility] with concern for lethargy, change in mental status.  Daughter at bedside.  Patient noted to be DNR/DNI.  Patient is nonweightbearing secondary to a femur fracture that was nonsurgical.  Because of this she is full assist.  However the past couple times a daughter has visited and has noted that the patient has been soiling herself.  They had concern for urinary tract infection and the facility has sent out a urine sample but she had not gotten any results back.  The daughter visited the patient earlier today and says that she was interactive but confused.  Sounds like her baseline is interactive but not conversational.  Now she states that she continues to sleep, will not interact, seems altered.  There has been no report of fall, head injury, fever.  No no new medications.  Prior to Admission medications   Medication Sig Start Date End Date Taking? Authorizing Provider  acetaminophen  (TYLENOL ) 500 MG tablet Take 500 mg by mouth in the morning and at bedtime.    [provider]  aspirin  EC 81 MG tablet Take 81 mg by mouth daily.    [provider]  Bacillus Coagulans-Inulin (PROBIOTIC FORMULA PO) Take 1 capsule by mouth in the morning.    [provider]  busPIRone  (BUSPAR ) 5 MG tablet Take 5 mg by mouth 2 (two) times daily.    [provider]  cholecalciferol  (VITAMIN D3) 25 MCG (1000 UNIT) tablet Take 1,000 Units by mouth daily.    [provider]  Cranberry-Vitamin C-Probiotic (AZO CRANBERRY PO) Take 1 tablet by mouth daily.    [provider]  FLUoxetine  (PROZAC ) 40 MG capsule Take 40 mg by mouth daily.    [provider]  folic acid  (FOLVITE ) 1 MG tablet Take 1  mg by mouth in the morning.    [provider]  lidocaine  4 % Place 1 patch onto the skin daily. To lower back. 12 hours on and 12 hours off    [provider]  Multiple Vitamin (DAILY VITE PO) Take 1 tablet by mouth daily.    [provider]  ondansetron  (ZOFRAN ) 4 MG tablet Take 4 mg by mouth every 8 (eight) hours as needed for nausea or vomiting.    [provider]  OVER THE COUNTER MEDICATION Take 1 tablet by mouth in the morning and at bedtime. QC stool softner/laxative    [provider]  pantoprazole  (PROTONIX ) 40 MG tablet Take 40 mg by mouth in the morning.    [provider]  polyethylene glycol (MIRALAX  / GLYCOLAX ) 17 g packet Take 17 g by mouth daily as needed for mild constipation, moderate constipation or severe constipation.    [provider]  Skin Protectants, Misc. (BAZA PROTECT EX) Apply 1 application  topically 2 (two) times daily. To buttocks    [provider]  zolpidem  (AMBIEN ) 5 MG tablet Take 1 tablet (5 mg total) by mouth at bedtime. 12/28/22   Krishnan, Gokul, MD    Allergies: Patient has no known allergies.    Review of Systems  Unable to perform ROS: Mental status change    Updated Vital Signs BP (!) 169/74 (BP Location: Left Arm)  Pulse 68   Temp 97.6 F (36.4 C) (Oral)   Resp 10   SpO2 98%   Physical Exam Vitals and nursing note reviewed.  Constitutional:      Appearance: Normal appearance.     Comments: Opens her eyes to her name, briefly cooperative but falls right back asleep  HENT:     Head: Normocephalic.     Mouth/Throat:     Mouth: Mucous membranes are moist.   Eyes:     Pupils: Pupils are equal, round, and reactive to light.    Cardiovascular:     Rate and Rhythm: Normal rate.  Pulmonary:     Effort: Pulmonary effort is normal. No respiratory distress.  Abdominal:     Palpations: Abdomen is soft.     Tenderness: There is no abdominal tenderness.    Musculoskeletal:     Comments: Slightly contracted extremities, warm to touch   Skin:    General: Skin is warm.   Neurological:     Mental Status: She is alert. She is disoriented.     (all labs ordered are listed, but only abnormal results are displayed) Labs Reviewed  CBC WITH DIFFERENTIAL/PLATELET  COMPREHENSIVE METABOLIC PANEL WITH GFR  URINALYSIS, ROUTINE W REFLEX MICROSCOPIC  AMMONIA  CBG MONITORING, ED    EKG: EKG Interpretation Date/Time:  Sunday December 11 2023 21:02:55 EDT Ventricular Rate:  69 PR Interval:  150 QRS Duration:  93 QT Interval:  438 QTC Calculation: 470 R Axis:   74  Text Interpretation: Sinus rhythm similar to previous Confirmed by Bari Flank [phone]) on 12/11/2023 9:06:35 PM  Radiology: No results found.   Procedures   Medications Ordered in the ED - No data to display                                  Medical Decision Making Amount and/or Complexity of Data Reviewed Labs: ordered. Radiology: ordered.   89 year old female presents to the emergency department with change in mental status/lethargy.  Concern for possible UTI.  Of note patient is nonweightbearing, usually interactive but not fully conversational.  She was last baseline over a day ago.  Noted today to be declining and less interactive.  Vitals are normal and stable.  She would briefly open her eyes to name for me but does not stay awake long enough to follow commands.  Fingerstick is normal on arrival.  Initial blood work shows no leukocytosis or metabolic abnormality.  Patient is pending urinalysis and head CT.  Otherwise will require admission for significant change from baseline.     Final diagnoses:  None    ED Discharge Orders     None          Bari Flank HERO, DO 12/11/23 [id number]

## 2023-12-12 ENCOUNTER — Inpatient Hospital Stay (HOSPITAL_COMMUNITY)

## 2023-12-12 DIAGNOSIS — G9340 Encephalopathy, unspecified: Secondary | ICD-10-CM | POA: Diagnosis not present

## 2023-12-12 DIAGNOSIS — R4182 Altered mental status, unspecified: Principal | ICD-10-CM | POA: Diagnosis present

## 2023-12-12 DIAGNOSIS — N390 Urinary tract infection, site not specified: Secondary | ICD-10-CM

## 2023-12-12 DIAGNOSIS — R410 Disorientation, unspecified: Secondary | ICD-10-CM

## 2023-12-12 LAB — COMPREHENSIVE METABOLIC PANEL WITH GFR
ALT: 21 U/L (ref 0–44)
AST: 35 U/L (ref 15–41)
Albumin: 2.6 g/dL — ABNORMAL LOW (ref 3.5–5.0)
Alkaline Phosphatase: 59 U/L (ref 38–126)
Anion gap: 8 (ref 5–15)
BUN: 23 mg/dL (ref 8–23)
CO2: 18 mmol/L — ABNORMAL LOW (ref 22–32)
Calcium: 7.6 mg/dL — ABNORMAL LOW (ref 8.9–10.3)
Chloride: 114 mmol/L — ABNORMAL HIGH (ref 98–111)
Creatinine, Ser: 0.72 mg/dL (ref 0.44–1.00)
GFR, Estimated: >60 mL/min (ref >60)
Glucose, Bld: 80 mg/dL (ref 70–99)
Potassium: 3.3 mmol/L — ABNORMAL LOW (ref 3.5–5.1)
Sodium: 140 mmol/L (ref 135–145)
Total Bilirubin: 0.6 mg/dL (ref 0.0–1.2)
Total Protein: 5.9 g/dL — ABNORMAL LOW (ref 6.5–8.1)

## 2023-12-12 LAB — CBC
HCT: 38.5 % (ref 36.0–46.0)
HCT: 38.2 % (ref 36.0–46.0)
Hemoglobin: 11.8 g/dL — ABNORMAL LOW (ref 12.0–15.0)
Hemoglobin: 11.7 g/dL — ABNORMAL LOW (ref 12.0–15.0)
MCH: 29.7 pg (ref 26.0–34.0)
MCH: 30.2 pg (ref 26.0–34.0)
MCHC: 30.6 g/dL (ref 30.0–36.0)
MCHC: 30.6 g/dL (ref 30.0–36.0)
MCV: 97 fL (ref 80.0–100.0)
MCV: 98.7 fL (ref 80.0–100.0)
Platelets: 257 10*3/uL (ref 150–400)
Platelets: 269 10*3/uL (ref 150–400)
RBC: 3.97 MIL/uL (ref 3.87–5.11)
RBC: 3.87 MIL/uL (ref 3.87–5.11)
RDW: 14.2 % (ref 11.5–15.5)
RDW: 14.5 % (ref 11.5–15.5)
WBC: 6.3 10*3/uL (ref 4.0–10.5)
WBC: 7.6 10*3/uL (ref 4.0–10.5)
nRBC: 0 % (ref 0.0–0.2)
nRBC: 0 % (ref 0.0–0.2)

## 2023-12-12 LAB — TSH
TSH: 2.246 u[IU]/mL (ref 0.350–4.500)
TSH: 1.495 u[IU]/mL (ref 0.350–4.500)

## 2023-12-12 LAB — CREATININE, SERUM
Creatinine, Ser: 0.98 mg/dL (ref 0.44–1.00)
GFR, Estimated: 52 mL/min — ABNORMAL LOW (ref >60)

## 2023-12-12 MED ORDER — ONDANSETRON HCL 4 MG PO TABS: 4.0000 mg | ORAL_TABLET | Freq: Four times a day (QID) | ORAL | Status: DC | PRN

## 2023-12-12 MED ORDER — SODIUM CHLORIDE 0.9 % IV SOLN
2.0000 g | INTRAVENOUS | Status: DC
  Administered 2023-12-12: 2 g via INTRAVENOUS
  Filled 2023-12-12: qty 20

## 2023-12-12 MED ORDER — FOLIC ACID 1 MG PO TABS
1.0000 mg | ORAL_TABLET | Freq: Every day | ORAL | Status: DC
  Administered 2023-12-12: 1 mg via ORAL
  Filled 2023-12-12: qty 1

## 2023-12-12 MED ORDER — SODIUM CHLORIDE 0.9 % IV SOLN
INTRAVENOUS | Status: DC
Start: 2023-12-12 — End: 2023-12-12

## 2023-12-12 MED ORDER — ASPIRIN 81 MG PO TBEC
81.0000 mg | DELAYED_RELEASE_TABLET | Freq: Every day | ORAL | Status: DC
  Administered 2023-12-12: 81 mg via ORAL
  Filled 2023-12-12: qty 1

## 2023-12-12 MED ORDER — HEPARIN SODIUM (PORCINE) 5000 UNIT/ML IJ SOLN
5000.0000 [IU] | Freq: Three times a day (TID) | INTRAMUSCULAR | Status: DC
Start: 2023-12-12 — End: 2023-12-12
  Administered 2023-12-12 (×2): 5000 [IU] via SUBCUTANEOUS
  Filled 2023-12-12 (×2): qty 1

## 2023-12-12 MED ORDER — VITAMIN D 25 MCG (1000 UNIT) PO TABS
1000.0000 [IU] | ORAL_TABLET | Freq: Every day | ORAL | Status: DC
  Administered 2023-12-12: 1000 [IU] via ORAL
  Filled 2023-12-12: qty 1

## 2023-12-12 MED ORDER — POTASSIUM CHLORIDE CRYS ER 20 MEQ PO TBCR
40.0000 meq | EXTENDED_RELEASE_TABLET | Freq: Once | ORAL | Status: AC
  Administered 2023-12-12: 40 meq via ORAL
  Filled 2023-12-12: qty 2

## 2023-12-12 MED ORDER — POLYETHYLENE GLYCOL 3350 17 G PO PACK: 17.0000 g | PACK | Freq: Every day | ORAL | Status: DC | PRN

## 2023-12-12 MED ORDER — POTASSIUM CHLORIDE 10 MEQ/100ML IV SOLN
10.0000 meq | INTRAVENOUS | Status: AC
  Administered 2023-12-12 (×3): 10 meq via INTRAVENOUS
  Filled 2023-12-12 (×3): qty 100

## 2023-12-12 MED ORDER — ALBUTEROL SULFATE (2.5 MG/3ML) 0.083% IN NEBU
2.5000 mg | INHALATION_SOLUTION | RESPIRATORY_TRACT | Status: DC | PRN
Start: 2023-12-12 — End: 2023-12-12

## 2023-12-12 MED ORDER — FLUOXETINE HCL 20 MG PO CAPS
40.0000 mg | ORAL_CAPSULE | Freq: Every day | ORAL | Status: DC
  Administered 2023-12-12: 40 mg via ORAL
  Filled 2023-12-12: qty 2

## 2023-12-12 MED ORDER — ACETAMINOPHEN 500 MG PO TABS: 500.0000 mg | ORAL_TABLET | Freq: Three times a day (TID) | ORAL | Status: DC | PRN

## 2023-12-12 MED ORDER — ONDANSETRON HCL 4 MG/2ML IJ SOLN: 4.0000 mg | Freq: Four times a day (QID) | INTRAMUSCULAR | Status: DC | PRN

## 2023-12-12 NOTE — TOC Transition Note (Signed)
 Transition of Care [facility]) - Discharge Note  Patient Details  Name: Evelyn [name] MRN: [id number] Date of Birth: Mar 02, 1925  Transition [facility] CM/SW Contact:  Duwaine GORMAN Aran, LCSW Phone Number: 12/12/2023, 3:34 PM  Clinical Narrative: Patient is medically stable to return to [facility]. CSW left 2 voicemails for Therisa Berg regarding the patient returning, but did not receive a call back. CSW spoke with Avelina Lister at [facility] regarding discharge. Ms. Lister requested that [facility] and discharge summary be faxed to [phone]. FL2 completed. Discharge paperwork faxed to facility. CSW unable to reach Ms. Lister and voicemail was left requesting call back. CSW followed up with [facility] supervisor regarding being unable to reach staff at the ALF. TOC supervisor spoke with Senita and PT[facility] can be set up. Medical necessity form done; PTAR scheduled. Discharge packet completed. CSW updated daughter regarding transportation. RN updated. TOC signing off.  Final next level of care: Assisted Living Barriers to Discharge: No Barriers Identified  Patient Goals and CMS Choice Patient states their goals for this hospitalization and ongoing recovery are:: Return to [facility] Choice offered to / list presented to : NA  Discharge Placement Patient to be transferred to facility by: PTAR Name of family member notified: [facility] (daughter) Patient and family notified of of transfer: 12/12/23  Discharge Plan and Services Additional resources added to the After Visit Summary for          DME Arranged: N/A DME Agency: NA  Social Drivers of Health (SDOH) Interventions SDOH Screenings   Food Insecurity: No Food Insecurity (12/12/2023)  Housing: Low Risk  (12/12/2023)  Transportation Needs: No Transportation Needs (12/12/2023)  Utilities: Not At Risk (12/12/2023)  Alcohol Screen: Low Risk  (09/09/2020)  Depression (PHQ2-9): Low Risk  (03/17/2023)  Financial Resource Strain: Low Risk   (09/09/2020)  Physical Activity: Sufficiently Active (09/09/2020)  Social Connections: Moderately Integrated (12/12/2023)  Stress: No Stress Concern Present (09/09/2020)  Tobacco Use: Low Risk  (08/30/2023)   Readmission Risk Interventions    07/13/2023    2:03 PM 04/04/2022    1:26 PM  Readmission Risk Prevention Plan  Transportation Screening Complete Complete  PCP or Specialist Appt within 5-7 Days Complete Complete  Home Care Screening Complete Complete  Medication Review (RN CM) Complete Complete

## 2023-12-12 NOTE — H&P (Signed)
 History and Physical    Evelyn [name] FMW:[id number] DOB: 11-15-1924 DOA: 12/11/2023  PCP: Patient, No Pcp Per  Patient coming from: [facility]   I have personally briefly reviewed patient's old medical records in [facility]  Chief Complaint:  change in ms  HPI: Evelyn [name] is a 89 y.o. female with medical history significant of  Depression, GERD/PUD, HLD , legally blind, hx of UTI, CVA recent hx of femur fracture non-surgical with non weight bearing status, who presents to ED due to change in ms. Per family over the last few days patient has had increase urination with incontinence. She was also noted to be more confused than baseline. Patient was thought to have a UTI at facility and UA was sent but not resulted. However with progression of patient confusion patient was brought to ED. Patient currently is alert and oriented x3, she denies pain, sob, difficulty with urine.  Appears to be at her baseline.  ED Course:  In ED patient evaluated and noted to have stable vitals. Patient on evaluation also found to have UA significant for UTI. Patient is admitted for further treatment . Vitals: afeb ,bp 168/74, hr 74, sat 97%  EKG:nsr  Na 138, K 4.1, cr 0.99,  Ast 43,  Wbc 7, hgb 12.4, plt 254 UA  wbc>50 + Le + bacteria Ammonia 18 Cth IMPRESSION: 1. No acute intracranial abnormality. 2. Stable remote infarcts of the right basal ganglia and right caudate nucleus. 3. Stable remote lacunar infarct of the left cerebellar hemisphere.   Tx ctx   Review of Systems: As per HPI otherwise 10 point review of systems negative.   Past Medical History:  Diagnosis Date   Allergy    Arthritis    Chronic cystitis    Congenital blindness    right eye   Depression    Gastric ulcer    H/O: hematuria    Hepatitis, unspecified    statin related, resolved quickly   Hyperlipidemia    Legally blind in right eye, as defined in [place]     Syncope and collapse 04/01/2014   UTI (lower urinary  tract infection)    HISTORY OF UTI    Past Surgical History:  Procedure Laterality Date   ABDOMINAL HYSTERECTOMY     vaginal   BLADDER SUSPENSION     A-P   CATARACT EXTRACTION W/ INTRAOCULAR LENS IMPLANT Left    ESOPHAGOGASTRODUODENOSCOPY     HIP ARTHROPLASTY Left 12/17/2021   Procedure: ARTHROPLASTY BIPOLAR HIP (HEMIARTHROPLASTY);  Surgeon: Barbarann Oneil BROCKS, MD;  Location: [facility];  Service: Orthopedics;  Laterality: Left;   OPEN REDUCTION INTERNAL FIXATION (ORIF) DISTAL RADIAL FRACTURE Right 03/09/2016   Procedure: OPEN REDUCTION INTERNAL FIXATION (ORIF) DISTAL RADIAL FRACTURE;  Surgeon: Franky Curia, MD;  Location: [facility];  Service: Orthopedics;  Laterality: Right;     reports that she has never smoked. She has never used smokeless tobacco. She reports that she does not drink alcohol and does not use drugs.  No Known Allergies  Family History  Problem Relation Age of Onset   Cancer Neg Hx        Colon cancer   Stroke Neg Hx    Heart disease Neg Hx    Hyperlipidemia Neg Hx    Hypertension Neg Hx    Kidney disease Neg Hx    Colon polyps Neg Hx    Colon cancer Neg Hx     Prior to Admission medications   Medication Sig Start  Date End Date Taking? Authorizing Provider  acetaminophen  (TYLENOL ) 500 MG tablet Take 500 mg by mouth in the morning and at bedtime.    [provider]  aspirin  EC 81 MG tablet Take 81 mg by mouth daily.    [provider]  Bacillus Coagulans-Inulin (PROBIOTIC FORMULA PO) Take 1 capsule by mouth in the morning.    [provider]  busPIRone  (BUSPAR ) 5 MG tablet Take 5 mg by mouth 2 (two) times daily.    [provider]  cholecalciferol  (VITAMIN D3) 25 MCG (1000 UNIT) tablet Take 1,000 Units by mouth daily.    [provider]  Cranberry-Vitamin C-Probiotic (AZO CRANBERRY PO) Take 1 tablet by mouth daily.    [provider]  FLUoxetine  (PROZAC ) 40 MG capsule Take 40 mg by mouth daily.     [provider]  folic acid  (FOLVITE ) 1 MG tablet Take 1 mg by mouth in the morning.    [provider]  lidocaine  4 % Place 1 patch onto the skin daily. To lower back. 12 hours on and 12 hours off    [provider]  Multiple Vitamin (DAILY VITE PO) Take 1 tablet by mouth daily.    [provider]  ondansetron  (ZOFRAN ) 4 MG tablet Take 4 mg by mouth every 8 (eight) hours as needed for nausea or vomiting.    [provider]  OVER THE COUNTER MEDICATION Take 1 tablet by mouth in the morning and at bedtime. QC stool softner/laxative    [provider]  pantoprazole  (PROTONIX ) 40 MG tablet Take 40 mg by mouth in the morning.    [provider]  polyethylene glycol (MIRALAX  / GLYCOLAX ) 17 g packet Take 17 g by mouth daily as needed for mild constipation, moderate constipation or severe constipation.    [provider]  Skin Protectants, Misc. (BAZA PROTECT EX) Apply 1 application  topically 2 (two) times daily. To buttocks    [provider]  zolpidem  (AMBIEN ) 5 MG tablet Take 1 tablet (5 mg total) by mouth at bedtime. 12/28/22   Verdene Purchase, MD    Physical Exam: Vitals:   12/11/23 2045 12/11/23 2104 12/11/23 2245  BP: (!) 169/74 (!) 169/74 (!) 164/81  Pulse: 74 68 63  Resp:  10 20  Temp: 97.6 F (36.4 C)    TempSrc: Oral    SpO2: 97% 98% 97%    Constitutional: NAD, calm, comfortable Vitals:   12/11/23 2045 12/11/23 2104 12/11/23 2245  BP: (!) 169/74 (!) 169/74 (!) 164/81  Pulse: 74 68 63  Resp:  10 20  Temp: 97.6 F (36.4 C)    TempSrc: Oral    SpO2: 97% 98% 97%   Eyes: lids and conjunctivae normal ENMT: Mucous membranes are dry Posterior pharynx clear of any exudate or lesions Neck: normal, supple, no masses, no thyromegaly Respiratory: clear to auscultation bilaterally, no wheezing, no crackles. Normal respiratory effort. No accessory muscle use.  Cardiovascular: Regular rate and rhythm, no  murmurs / rubs / gallops. No extremity edema. 2+ pedal pulses.  Abdomen: no tenderness, no masses palpated. No hepatosplenomegaly. Bowel sounds positive.  Musculoskeletal: no clubbing / cyanosis. Right ext slightly flexed, Skin: no rashes, lesions, ulcers. No induration Neurologic: CN grossly intact. Sensation intact, 4/5 upper ext 3-4 /5 lower ext  Psychiatric: Normal judgment and insight. Alert and oriented x 3. Normal mood.    Labs on Admission: I have personally reviewed following labs and imaging studies  CBC: Recent Labs  Lab 12/11/23 2216  WBC 7.0  NEUTROABS 4.6  HGB 12.4  HCT 39.8  MCV 98.0  PLT 254   Basic Metabolic Panel: Recent Labs  Lab 12/11/23 2216  NA 138  K 4.1  CL 108  CO2 22  GLUCOSE 96  BUN 31*  CREATININE 0.99  CALCIUM 9.2   GFR: CrCl cannot be calculated (Unknown ideal weight.). Liver Function Tests: Recent Labs  Lab 12/11/23 2216  AST 43*  ALT 25  ALKPHOS 76  BILITOT 0.7  PROT 7.5  ALBUMIN 3.4*   No results for input(s): LIPASE, AMYLASE in the last 168 hours. Recent Labs  Lab 12/11/23 2230  AMMONIA 18   Coagulation Profile: No results for input(s): INR, PROTIME in the last 168 hours. Cardiac Enzymes: No results for input(s): CKTOTAL, CKMB, CKMBINDEX, TROPONINI in the last 168 hours. BNP (last 3 results) No results for input(s): PROBNP in the last 8760 hours. HbA1C: No results for input(s): HGBA1C in the last 72 hours. CBG: Recent Labs  Lab 12/11/23 2048  GLUCAP 93   Lipid Profile: No results for input(s): CHOL, HDL, LDLCALC, TRIG, CHOLHDL, LDLDIRECT in the last 72 hours. Thyroid  Function Tests: No results for input(s): TSH, T4TOTAL, FREET4, T3FREE, THYROIDAB in the last 72 hours. Anemia Panel: No results for input(s): VITAMINB12, FOLATE, FERRITIN, TIBC, IRON , RETICCTPCT in the last 72 hours. Urine analysis:    Component Value Date/Time   COLORURINE AMBER (A)  12/11/2023 2216   APPEARANCEUR HAZY (A) 12/11/2023 2216   LABSPEC 1.021 12/11/2023 2216   PHURINE 5.0 12/11/2023 2216   GLUCOSEU NEGATIVE 12/11/2023 2216   GLUCOSEU NEGATIVE 02/01/2017 1212   HGBUR NEGATIVE 12/11/2023 2216   BILIRUBINUR NEGATIVE 12/11/2023 2216   KETONESUR NEGATIVE 12/11/2023 2216   PROTEINUR NEGATIVE 12/11/2023 2216   UROBILINOGEN 1.0 02/01/2017 1212   NITRITE NEGATIVE 12/11/2023 2216   LEUKOCYTESUR LARGE (A) 12/11/2023 2216    Radiological Exams on Admission: CT Head Wo Contrast Result Date: 12/11/2023 CLINICAL DATA:  Mental status change, unknown cause, lethargy EXAM: CT HEAD WITHOUT CONTRAST TECHNIQUE: Contiguous axial images were obtained from the base of the skull through the vertex without intravenous contrast. RADIATION DOSE REDUCTION: This exam was performed according to the departmental dose-optimization program which includes automated exposure control, adjustment of the mA and/or kV according to patient size and/or use of iterative reconstruction technique. COMPARISON:  03/21/2023, 02/06/2006 FINDINGS: Brain: Mild parenchymal volume loss is commensurate with the patient's age. Asymmetry of the lateral ventricles is again identified, similar to remote prior examination and likely developmental in etiology. Superimposed remote infarcts within the right basal ganglia and right caudate nucleus are unchanged. No acute intracranial hemorrhage or infarct. No abnormal mass effect or midline shift. No abnormal intra or extra-axial mass lesion. Ventricular size is stable. Stable remote lacunar infarct within the left cerebellar hemisphere. Vascular: No hyperdense vessel or unexpected calcification. Skull: Normal. Negative for fracture or focal lesion. Sinuses/Orbits: No acute finding. Other: Mastoid air cells and middle ear cavities are clear. IMPRESSION: 1. No acute intracranial abnormality. 2. Stable remote infarcts of the right basal ganglia and right caudate nucleus. 3. Stable  remote lacunar infarct of the left cerebellar hemisphere. Electronically Signed   By: Dorethia Molt M.D.   On: 12/11/2023 23:11    EKG: Independently reviewed.   Assessment/Plan    UTI  -admit med/tele - continue on ctx  -f/u on culture   Metabolic Encephalopathy  -note CT scan no acute finding  - presumed due to UTI  -treat  underlying cause  -supportive care  -of note ammonia negative -patient appears back to baseline  Hx femur fracture non-surgical - non weight bearing status  Hx of CVA -continue asa as secondary ppx   Depression -no active issues   GERD/PUD -ppi   HLD  -diet controlled    legally blind   DVT prophylaxis: heparin Code Status: DNR/DNI Family Communication: none at bedside Disposition Plan: patient  expected to be admitted greater than 2 midnights  Consults called: full/ as discussed per patient wishes in event of cardiac arrest  Admission status: med tele   Camila DELENA Ned MD [facility]   If 7PM-7AM, please contact night-coverage [email or web address] Password [facility]  12/12/2023, 12:20 AM

## 2023-12-12 NOTE — Progress Notes (Signed)
 Report called to [facility]. IV's removed. Tele removed. Pt placed in paper gown. [facility] at bedside to transfer patient.

## 2023-12-12 NOTE — Care Management Obs Status (Signed)
 MEDICARE OBSERVATION STATUS NOTIFICATION   Patient Details  Name: Evelyn [name] MRN: [id number] Date of Birth: 04-04-1925   Medicare Observation Status Notification Given:  Yes    Duwaine GORMAN Aran, LCSW 12/12/2023, 10:01 AM

## 2023-12-12 NOTE — Discharge Summary (Addendum)
 Physician Discharge Summary  Evelyn [name] FMW:[id number] DOB: 12-04-1924 DOA: 12/11/2023  PCP: Patient, No Pcp Per  Admit date: 12/11/2023 Discharge date: 12/12/2023 30 Day Unplanned Readmission Risk Score    Flowsheet Row ED to Hosp-Admission (Current) from 12/11/2023 in [facility]TH FLOOR PROGRESSIVE CARE AND UROLOGY  30 Day Unplanned Readmission Risk Score (%) 20.19 Filed at 12/12/2023 0801    This score is the patient's risk of an unplanned readmission within 30 days of being discharged (0 -100%). The score is based on dignosis, age, lab data, medications, orders, and past utilization.   Low:  0-14.9   Medium: 15-21.9   High: 22-29.9   Extreme: 30 and above          Admitted From: Assisted living facility Disposition: Assisted living facility  Recommendations for Outpatient Follow-up:  Follow up with PCP in 1-2 weeks Please obtain BMP/CBC in one week Please follow up with your PCP on the following pending results: Unresulted Labs (From admission, onward)    None         Home Health: None Equipment/Devices: None  Discharge Condition: Stable CODE STATUS: DNR Diet recommendation: Cardiac  HPI: Evelyn [name] is a 89 y.o. female with medical history significant of  Depression, GERD/PUD, HLD , legally blind, hx of UTI, CVA recent hx of femur fracture non-surgical with non weight bearing status, who presents to ED due to change in ms. Per family over the last few days patient has had increase urination with incontinence. She was also noted to be more confused than baseline. Patient was thought to have a UTI at facility and UA was sent but not resulted. However with progression of patient confusion patient was brought to ED. Patient currently is alert and oriented x3, she denies pain, sob, difficulty with urine.  Appears to be at her baseline.   ED Course:  In ED patient evaluated and noted to have stable vitals. Patient on evaluation also found to have UA significant for  UTI. Patient is admitted for further treatment . Vitals: afeb ,bp 168/74, hr 74, sat 97%  EKG:nsr  Na 138, K 4.1, cr 0.99,  Ast 43,  Wbc 7, hgb 12.4, plt 254 UA  wbc>50 + Le + bacteria Ammonia 18 Cth IMPRESSION: 1. No acute intracranial abnormality. 2. Stable remote infarcts of the right basal ganglia and right caudate nucleus. 3. Stable remote lacunar infarct of the left cerebellar hemisphere.  Subjective: Seen and examined.  Patient has no complaints.  She is fully alert and oriented.  Brief/Interim Summary: Patient was briefly hospitalized under hospitalist service yesterday for  confusion/altered mental status which was presumed to be secondary to UTI however according to the admitting hospitalist note, patient was not confused and she was alert and oriented x 3 even when she was admitted.  During my evaluation this morning, patient again has remained alert and oriented.  She is a very sharp 89 year old, she is able to verbalize her symptoms.  She denies any suprapubic pain, dysuria or increased frequency.  She even said that she knows how UTI presents and she has had experience of having abdominal pain in the past but she denied this time.  Her UA did not have nitrites, she is afebrile with no leukocytosis, I doubt UTI.  She was started on Rocephin .  I do not think there is any indication of treating this as UTI.  This is asymptomatic bacteriuria.  Rest of the medical issues remained stable.  She is at her baseline  so she is going to be discharged back to her assisted living facility today.  She does have mild hypokalemia for which she is going to get replenishment before discharge today.  Discharge plan was discussed with patient and/or family member and they verbalized understanding and agreed with it.  Discharge Diagnoses:  Principal Problem:   Change in mental status Active Problems:   AMS (altered mental status)    Discharge Instructions   Allergies as of 12/12/2023   No  Known Allergies      Medication List     STOP taking these medications    senna 8.6 MG Tabs tablet Commonly known as: SENOKOT       TAKE these medications    acetaminophen  500 MG tablet Commonly known as: TYLENOL  Take 500 mg by mouth in the morning and at bedtime.   aspirin  EC 81 MG tablet Take 81 mg by mouth daily.   AZO CRANBERRY PO Take 1 tablet by mouth daily.   BAZA PROTECT EX Apply 1 application  topically 2 (two) times daily. To buttocks   busPIRone  5 MG tablet Commonly known as: BUSPAR  Take 5 mg by mouth 2 (two) times daily.   cholecalciferol  25 MCG (1000 UNIT) tablet Commonly known as: VITAMIN D3 Take 1,000 Units by mouth daily.   DAILY VITE PO Take 1 tablet by mouth daily.   FLUoxetine  40 MG capsule Commonly known as: PROZAC  Take 40 mg by mouth daily.   folic acid  1 MG tablet Commonly known as: FOLVITE  Take 1 mg by mouth in the morning.   lidocaine  4 % Place 1 patch onto the skin daily. To lower back. 12 hours on and 12 hours off   ondansetron  4 MG tablet Commonly known as: ZOFRAN  Take 4 mg by mouth every 8 (eight) hours as needed for nausea or vomiting.   OVER THE COUNTER MEDICATION Take 1 tablet by mouth in the morning and at bedtime. QC stool softner/laxative   pantoprazole  40 MG tablet Commonly known as: PROTONIX  Take 40 mg by mouth in the morning.   polyethylene glycol 17 g packet Commonly known as: MIRALAX  / GLYCOLAX  Take 17 g by mouth daily as needed for mild constipation, moderate constipation or severe constipation.   PROBIOTIC FORMULA PO Take 1 capsule by mouth in the morning.   zolpidem  5 MG tablet Commonly known as: AMBIEN  Take 1 tablet (5 mg total) by mouth at bedtime.        Follow-up Information     PCP Follow up in 1 week(s).                 No Known Allergies  Consultations: None   Procedures/Studies: DG CHEST PORT 1 VIEW Result Date: 12/12/2023 CLINICAL DATA:  Altered mental status EXAM:  PORTABLE CHEST 1 VIEW COMPARISON:  07/12/2023 FINDINGS: Cardiac shadow is within normal limits. The lungs are well aerated bilaterally. No focal infiltrate is seen. Large hiatal hernia is again seen. Multiple old healed rib fractures are noted bilaterally. IMPRESSION: No acute abnormality noted. Electronically Signed   By: Oneil Devonshire M.D.   On: 12/12/2023 00:56   CT Head Wo Contrast Result Date: 12/11/2023 CLINICAL DATA:  Mental status change, unknown cause, lethargy EXAM: CT HEAD WITHOUT CONTRAST TECHNIQUE: Contiguous axial images were obtained from the base of the skull through the vertex without intravenous contrast. RADIATION DOSE REDUCTION: This exam was performed according to the departmental dose-optimization program which includes automated exposure control, adjustment of the mA and/or kV according to  patient size and/or use of iterative reconstruction technique. COMPARISON:  03/21/2023, 02/06/2006 FINDINGS: Brain: Mild parenchymal volume loss is commensurate with the patient's age. Asymmetry of the lateral ventricles is again identified, similar to remote prior examination and likely developmental in etiology. Superimposed remote infarcts within the right basal ganglia and right caudate nucleus are unchanged. No acute intracranial hemorrhage or infarct. No abnormal mass effect or midline shift. No abnormal intra or extra-axial mass lesion. Ventricular size is stable. Stable remote lacunar infarct within the left cerebellar hemisphere. Vascular: No hyperdense vessel or unexpected calcification. Skull: Normal. Negative for fracture or focal lesion. Sinuses/Orbits: No acute finding. Other: Mastoid air cells and middle ear cavities are clear. IMPRESSION: 1. No acute intracranial abnormality. 2. Stable remote infarcts of the right basal ganglia and right caudate nucleus. 3. Stable remote lacunar infarct of the left cerebellar hemisphere. Electronically Signed   By: Dorethia Molt M.D.   On: 12/11/2023 23:11      Discharge Exam: Vitals:   12/12/23 1225 12/12/23 1611  BP: (!) 165/75 (!) 157/76  Pulse: 79 68  Resp: 16 18  Temp: 98 F (36.7 C) 98.1 F (36.7 C)  SpO2: 100% 98%   Vitals:   12/12/23 0609 12/12/23 0637 12/12/23 1225 12/12/23 1611  BP:  (!) 170/74 (!) 165/75 (!) 157/76  Pulse:  72 79 68  Resp:  18 16 18   Temp: 97.8 F (36.6 C) 97.7 F (36.5 C) 98 F (36.7 C) 98.1 F (36.7 C)  TempSrc: Oral Oral Oral Oral  SpO2:  97% 100% 98%  Weight:  42.2 kg    Height:  5' 5 (1.651 m)      General: Pt is alert, awake, not in acute distress Cardiovascular: RRR, S1/S2 +, no rubs, no gallops Respiratory: CTA bilaterally, no wheezing, no rhonchi Abdominal: Soft, NT, ND, bowel sounds + Extremities: no edema, no cyanosis    The results of significant diagnostics from this hospitalization (including imaging, microbiology, ancillary and laboratory) are listed below for reference.     Microbiology: No results found for this or any previous visit (from the past 240 hours).   Labs: BNP (last 3 results) No results for input(s): BNP in the last 8760 hours. Basic Metabolic Panel: Recent Labs  Lab 12/11/23 2216 12/12/23 0222 12/12/23 0615  NA 138  --  140  K 4.1  --  3.3*  CL 108  --  114*  CO2 22  --  18*  GLUCOSE 96  --  80  BUN 31*  --  23  CREATININE 0.99 0.98 0.72  CALCIUM 9.2  --  7.6*   Liver Function Tests: Recent Labs  Lab 12/11/23 2216 12/12/23 0615  AST 43* 35  ALT 25 21  ALKPHOS 76 59  BILITOT 0.7 0.6  PROT 7.5 5.9*  ALBUMIN 3.4* 2.6*   No results for input(s): LIPASE, AMYLASE in the last 168 hours. Recent Labs  Lab 12/11/23 2230  AMMONIA 18   CBC: Recent Labs  Lab 12/11/23 2216 12/12/23 0222 12/12/23 1046  WBC 7.0 6.3 7.6  NEUTROABS 4.6  --   --   HGB 12.4 11.8* 11.7*  HCT 39.8 38.5 38.2  MCV 98.0 97.0 98.7  PLT 254 257 269   Cardiac Enzymes: No results for input(s): CKTOTAL, CKMB, CKMBINDEX, TROPONINI in the last 168  hours. BNP: Invalid input(s): POCBNP CBG: Recent Labs  Lab 12/11/23 2048  GLUCAP 93   D-Dimer No results for input(s): DDIMER in the last 72 hours. Hgb A1c  No results for input(s): HGBA1C in the last 72 hours. Lipid Profile No results for input(s): CHOL, HDL, LDLCALC, TRIG, CHOLHDL, LDLDIRECT in the last 72 hours. Thyroid  function studies Recent Labs    12/12/23 1046  TSH 1.495   Anemia work up No results for input(s): VITAMINB12, FOLATE, FERRITIN, TIBC, IRON , RETICCTPCT in the last 72 hours. Urinalysis    Component Value Date/Time   COLORURINE AMBER (A) 12/11/2023 2216   APPEARANCEUR HAZY (A) 12/11/2023 2216   LABSPEC 1.021 12/11/2023 2216   PHURINE 5.0 12/11/2023 2216   GLUCOSEU NEGATIVE 12/11/2023 2216   GLUCOSEU NEGATIVE 02/01/2017 1212   HGBUR NEGATIVE 12/11/2023 2216   BILIRUBINUR NEGATIVE 12/11/2023 2216   KETONESUR NEGATIVE 12/11/2023 2216   PROTEINUR NEGATIVE 12/11/2023 2216   UROBILINOGEN 1.0 02/01/2017 1212   NITRITE NEGATIVE 12/11/2023 2216   LEUKOCYTESUR LARGE (A) 12/11/2023 2216   Sepsis Labs Recent Labs  Lab 12/11/23 2216 12/12/23 0222 12/12/23 1046  WBC 7.0 6.3 7.6   Microbiology No results found for this or any previous visit (from the past 240 hours).  FURTHER DISCHARGE INSTRUCTIONS:   Get Medicines reviewed and adjusted: Please take all your medications with you for your next visit with your Primary MD   Laboratory/radiological data: Please request your Primary MD to go over all hospital tests and procedure/radiological results at the follow up, please ask your Primary MD to get all Hospital records sent to his/her office.   In some cases, they will be blood work, cultures and biopsy results pending at the time of your discharge. Please request that your primary care M.D. goes through all the records of your hospital data and follows up on these results.   Also Note the following: If you experience  worsening of your admission symptoms, develop shortness of breath, life threatening emergency, suicidal or homicidal thoughts you must seek medical attention immediately by calling 911 or calling your MD immediately  if symptoms less severe.   You must read complete instructions/literature along with all the possible adverse reactions/side effects for all the Medicines you take and that have been prescribed to you. Take any new Medicines after you have completely understood and accpet all the possible adverse reactions/side effects.    patient was instructed, not to drive, operate heavy machinery, perform activities at heights, swimming or participation in water activities or provide baby-sitting services while on Pain, Sleep and Anxiety Medications; until their outpatient Physician has advised to do so again. Also recommended to not to take more than prescribed Pain, Sleep and Anxiety Medications.  It is not advisable to combine anxiety, sleep and pain medications without talking with your primary care provider.     Wear Seat belts while driving.   Please note: You were cared for by a hospitalist during your hospital stay. Once you are discharged, your primary care physician will handle any further medical issues. Please note that NO REFILLS for any discharge medications will be authorized once you are discharged, as it is imperative that you return to your primary care physician (or establish a relationship with a primary care physician if you do not have one) for your post hospital discharge needs so that they can reassess your need for medications and monitor your lab values  Time coordinating discharge: Over 30 minutes  SIGNED:   Fredia Skeeter, MD  [facility] 12/12/2023, 5:04 PM *Please note that this is a verbal dictation therefore any spelling or grammatical errors are due to the Dragon Medical One system interpretation. If  7PM-7AM, please contact night-coverage [email or web address]

## 2023-12-12 NOTE — NC FL2 (Signed)
 [place]  MEDICAID FL2 LEVEL OF CARE FORM     IDENTIFICATION  Patient Name: Evelyn [name] Birthdate: Sep 09, 1924 Sex: female Admission Date (Current Location): 12/11/2023  [facility] and [place] Number:  Producer, television/film/video and Address:  [facility],  [address] [place], [place] [address]      Provider Number: [id number]  Attending Physician Name and Address:  Vernon Ranks, MD  Relative Name and Phone Number:  [employer] (daughter) Ph: [phone]    Current Level of Care: Hospital Recommended Level of Care: Assisted Living Facility Prior Approval Number:    Date Approved/Denied:   PASRR Number:    Discharge Plan: Other (Comment) ([facility])    Current Diagnoses: Patient Active Problem List   Diagnosis Date Noted   Change in mental status 12/12/2023   AMS (altered mental status) 12/12/2023   Palliative care by specialist 07/17/2023   Closed displaced fracture of lesser trochanter of left femur (HCC) 07/13/2023   Closed fracture of ramus of right pubis (HCC) 12/24/2022   Lower lobe pneumonia 04/03/2022   Debility 04/03/2022   Pyuria 12/18/2021   Fever 12/18/2021   Macrocytic anemia 12/18/2021   Malnutrition of moderate degree 12/18/2021   Fracture of femoral neck, left (HCC) 12/17/2021   Bilateral acute nondisplaced hip fracture (HCC) 12/17/2021   Delirium 11/09/2021   Essential hypertension 11/08/2021   Hypokalemia 11/08/2021   Compression fracture of body of thoracic vertebra (HCC) 11/06/2021   Acute anemia 11/06/2021   Cognitive impairment 11/06/2021   Lactic acidosis 11/06/2021   Black stools    Heme + stool    Peptic ulcer disease    Acute metabolic encephalopathy 11/05/2021   Closed wedge compression fracture of T12 vertebra (HCC) 11/24/2016   Chronic midline low back pain 11/18/2016   Fatigue 11/18/2016   CKD stage 3a, GFR 45-59 ml/min (HCC) 11/18/2016   IBS (irritable bowel syndrome)    UTI (urinary tract infection)    AKI (acute  kidney injury) (HCC) 01/30/2015   Routine general medical examination at a health care facility 05/03/2013   Chronic idiopathic constipation 03/31/2012   Other screening mammogram 03/24/2011   Depression 03/06/2010   Insomnia 03/08/2008   HLD (hyperlipidemia) 02/21/2007    Orientation RESPIRATION BLADDER Height & Weight     Self, Time, Situation, Place  Normal Incontinent Weight: 93 lb 0.6 oz (42.2 kg) Height:  5' 5 (165.1 cm)  BEHAVIORAL SYMPTOMS/MOOD NEUROLOGICAL BOWEL NUTRITION STATUS      Continent Diet (Heart healthy diet)  AMBULATORY STATUS COMMUNICATION OF NEEDS Skin   Limited Assist Verbally Other (Comment) (Ecchymosis: bilateral arms; Erythema: back, groin)                       Personal Care Assistance Level of Assistance  Bathing, Feeding, Dressing Bathing Assistance: Limited assistance Feeding assistance: Independent Dressing Assistance: Limited assistance     Functional Limitations Info  Sight, Hearing, Speech Sight Info: Impaired Hearing Info: Adequate Speech Info: Adequate    SPECIAL CARE FACTORS FREQUENCY                       Contractures Contractures Info: Not present    Additional Factors Info  Code Status, Allergies Code Status Info: DNR Allergies Info: NKA           Current Medications (12/12/2023):  This is the current hospital active medication list Current Facility-Administered Medications  Medication Dose Route Frequency Provider Last Rate Last Admin  0.9 %  sodium chloride  infusion   Intravenous Continuous Debby Camila LABOR, MD 75 mL/hr at 12/12/23 9177 Infusion Verify at 12/12/23 9177   acetaminophen  (TYLENOL ) tablet 500 mg  500 mg Oral Q8H PRN Thomas, Sara-Maiz A, MD       albuterol  (PROVENTIL ) (2.5 MG/3ML) 0.083% nebulizer solution 2.5 mg  2.5 mg Nebulization Q2H PRN Thomas, Sara-Maiz A, MD       aspirin  EC tablet 81 mg  81 mg Oral Daily Thomas, Sara-Maiz A, MD   81 mg at 12/12/23 0818   cefTRIAXone  (ROCEPHIN ) 2 g in  sodium chloride  0.9 % 100 mL IVPB  2 g Intravenous Q24H Debby Camila LABOR, MD       cholecalciferol  (VITAMIN D3) 25 MCG (1000 UNIT) tablet 1,000 Units  1,000 Units Oral Daily Debby Camila LABOR, MD   1,000 Units at 12/12/23 0818   FLUoxetine  (PROZAC ) capsule 40 mg  40 mg Oral Daily Thomas, Sara-Maiz A, MD   40 mg at 12/12/23 0818   folic acid  (FOLVITE ) tablet 1 mg  1 mg Oral Daily Thomas, Sara-Maiz A, MD   1 mg at 12/12/23 0818   heparin injection 5,000 Units  5,000 Units Subcutaneous Q8H Debby Camila A, MD   5,000 Units at 12/12/23 9343   ondansetron  (ZOFRAN ) tablet 4 mg  4 mg Oral Q6H PRN Debby Camila LABOR, MD       Or   ondansetron  (ZOFRAN ) injection 4 mg  4 mg Intravenous Q6H PRN Thomas, Sara-Maiz A, MD       polyethylene glycol (MIRALAX  / GLYCOLAX ) packet 17 g  17 g Oral Daily PRN Thomas, Sara-Maiz A, MD         Discharge Medications: Please see discharge summary for a list of discharge medications. acetaminophen  500 MG tablet Commonly known as: TYLENOL  Take 500 mg by mouth in the morning and at bedtime.    aspirin  EC 81 MG tablet Take 81 mg by mouth daily.    AZO CRANBERRY PO Take 1 tablet by mouth daily.    BAZA PROTECT EX Apply 1 application  topically 2 (two) times daily. To buttocks    busPIRone  5 MG tablet Commonly known as: BUSPAR  Take 5 mg by mouth 2 (two) times daily.    cholecalciferol  25 MCG (1000 UNIT) tablet Commonly known as: VITAMIN D3 Take 1,000 Units by mouth daily.    DAILY VITE PO Take 1 tablet by mouth daily.    FLUoxetine  40 MG capsule Commonly known as: PROZAC  Take 40 mg by mouth daily.    folic acid  1 MG tablet Commonly known as: FOLVITE  Take 1 mg by mouth in the morning.    lidocaine  4 % Place 1 patch onto the skin daily. To lower back. 12 hours on and 12 hours off    ondansetron  4 MG tablet Commonly known as: ZOFRAN  Take 4 mg by mouth every 8 (eight) hours as needed for nausea or vomiting.    OVER THE COUNTER  MEDICATION Take 1 tablet by mouth in the morning and at bedtime. QC stool softner/laxative    pantoprazole  40 MG tablet Commonly known as: PROTONIX  Take 40 mg by mouth in the morning.    polyethylene glycol 17 g packet Commonly known as: MIRALAX  / GLYCOLAX  Take 17 g by mouth daily as needed for mild constipation, moderate constipation or severe constipation.    PROBIOTIC FORMULA PO Take 1 capsule by mouth in the morning.    zolpidem  5 MG tablet Commonly known as: AMBIEN  Take  1 tablet (5 mg total) by mouth at bedtime.   Relevant Imaging Results:  Relevant Lab Results:   Additional Information SSN: [id number]  Duwaine GORMAN Aran, LCSW

## 2023-12-12 NOTE — Care Management CC44 (Signed)
 Condition Code 44 Documentation Completed  Patient Details  Name: LI BOBO MRN: [id number] Date of Birth: 1924-09-26   Condition Code 44 given:  Yes Patient signature on Condition Code 44 notice:  Yes Documentation of 2 MD's agreement:  Yes Code 44 added to claim:  Yes    Duwaine GORMAN Aran, LCSW 12/12/2023, 10:01 AM

## 2024-05-26 ENCOUNTER — Emergency Department (HOSPITAL_COMMUNITY)
Admission: EM | Admit: 2024-05-26 | Discharge: 2024-05-27 | Disposition: A | Discharge status: Skilled Nursing Facility | Source: Skilled Nursing Facility | Attending: Emergency Medicine | Admitting: Emergency Medicine

## 2024-05-26 ENCOUNTER — Other Ambulatory Visit: Payer: Self-pay

## 2024-05-26 ENCOUNTER — Encounter (HOSPITAL_COMMUNITY): Payer: Self-pay

## 2024-05-26 DIAGNOSIS — S0181XA Laceration without foreign body of other part of head, initial encounter: Secondary | ICD-10-CM

## 2024-05-26 DIAGNOSIS — W19XXXA Unspecified fall, initial encounter: Secondary | ICD-10-CM

## 2024-05-26 DIAGNOSIS — E860 Dehydration: Secondary | ICD-10-CM

## 2024-05-26 NOTE — ED Triage Notes (Signed)
 PT BIB [facility]. Per EMS PT was trying to walk without her walker and fell. She has a skin tear on her arm and a small lac to her right cheek.

## 2024-05-27 ENCOUNTER — Emergency Department (HOSPITAL_COMMUNITY)

## 2024-05-27 DIAGNOSIS — S01111A Laceration without foreign body of right eyelid and periocular area, initial encounter: Secondary | ICD-10-CM | POA: Diagnosis not present

## 2024-05-27 LAB — COMPREHENSIVE METABOLIC PANEL WITH GFR
ALT: 58 U/L — ABNORMAL HIGH (ref 0–44)
AST: 76 U/L — ABNORMAL HIGH (ref 15–41)
Albumin: 4.1 g/dL (ref 3.5–5.0)
Alkaline Phosphatase: 103 U/L (ref 38–126)
Anion gap: 12 (ref 5–15)
BUN: 35 mg/dL — ABNORMAL HIGH (ref 8–23)
CO2: 21 mmol/L — ABNORMAL LOW (ref 22–32)
Calcium: 9.3 mg/dL (ref 8.9–10.3)
Chloride: 107 mmol/L (ref 98–111)
Creatinine, Ser: 1.18 mg/dL — ABNORMAL HIGH (ref 0.44–1.00)
GFR, Estimated: 42 mL/min — ABNORMAL LOW (ref >60)
Glucose, Bld: 100 mg/dL — ABNORMAL HIGH (ref 70–99)
Potassium: 4.1 mmol/L (ref 3.5–5.1)
Sodium: 139 mmol/L (ref 135–145)
Total Bilirubin: 0.4 mg/dL (ref 0.0–1.2)
Total Protein: 8 g/dL (ref 6.5–8.1)

## 2024-05-27 LAB — URINALYSIS, ROUTINE W REFLEX MICROSCOPIC
Bacteria, UA: NONE SEEN
Bilirubin Urine: NEGATIVE
Glucose, UA: NEGATIVE mg/dL
Hgb urine dipstick: NEGATIVE
Ketones, ur: NEGATIVE mg/dL
Nitrite: NEGATIVE
Protein, ur: 30 mg/dL — AB
Specific Gravity, Urine: 1.018 (ref 1.005–1.030)
WBC, UA: >50 WBC/hpf (ref 0–5)
pH: 7 (ref 5.0–8.0)

## 2024-05-27 LAB — CBC WITH DIFFERENTIAL/PLATELET
Abs Immature Granulocytes: 0.05 K/uL (ref 0.00–0.07)
Basophils Absolute: 0.1 K/uL (ref 0.0–0.1)
Basophils Relative: 1 %
Eosinophils Absolute: 0.1 K/uL (ref 0.0–0.5)
Eosinophils Relative: 1 %
HCT: 39 % (ref 36.0–46.0)
Hemoglobin: 12.3 g/dL (ref 12.0–15.0)
Immature Granulocytes: 1 %
Lymphocytes Relative: 20 %
Lymphs Abs: 1.8 K/uL (ref 0.7–4.0)
MCH: 31.3 pg (ref 26.0–34.0)
MCHC: 31.5 g/dL (ref 30.0–36.0)
MCV: 99.2 fL (ref 80.0–100.0)
Monocytes Absolute: 0.9 K/uL (ref 0.1–1.0)
Monocytes Relative: 11 %
Neutro Abs: 5.8 K/uL (ref 1.7–7.7)
Neutrophils Relative %: 66 %
Platelets: 262 K/uL (ref 150–400)
RBC: 3.93 MIL/uL (ref 3.87–5.11)
RDW: 14.5 % (ref 11.5–15.5)
WBC: 8.7 K/uL (ref 4.0–10.5)
nRBC: 0 % (ref 0.0–0.2)

## 2024-05-27 MED ORDER — SODIUM CHLORIDE 0.9 % IV BOLUS
250.0000 mL | Freq: Once | INTRAVENOUS | Status: AC
  Administered 2024-05-27: 250 mL via INTRAVENOUS

## 2024-05-27 MED ORDER — LIDOCAINE-EPINEPHRINE-TETRACAINE (LET) TOPICAL GEL
3.0000 mL | Freq: Once | TOPICAL | Status: AC
  Administered 2024-05-27: 3 mL via TOPICAL
  Filled 2024-05-27: qty 3

## 2024-05-27 NOTE — ED Provider Notes (Signed)
 [place] EMERGENCY DEPARTMENT AT [facility] Provider Note   CSN: [id number] Arrival date & time: 05/26/24  2311     Patient presents with: [place]   Evelyn [name] is a 89 y.o. female.   The history is provided by the patient and medical records.  Fall  Evelyn [name] is a 89 y.o. female who presents to the Emergency Department complaining of fall.  Level 5 caveat due to dementia.  History is provided by the daughter at the bedside.  Daughter states that patient lives at [facility] assisted living facility and is at baseline confined to a wheelchair and nonambulatory.  She had an unwitnessed fall and was found on the ground with a cut to her right head.  Daughter states that patient has been checked routinely for urinary tract infections and did have a urinalysis earlier this month that was negative for UTI.  She is on prophylactic antibiotics.     Prior to Admission medications   Medication Sig Start Date End Date Taking? Authorizing Provider  acetaminophen  (TYLENOL ) 500 MG tablet Take 500 mg by mouth in the morning and at bedtime.    [provider]  aspirin  EC 81 MG tablet Take 81 mg by mouth daily.    [provider]  Bacillus Coagulans-Inulin (PROBIOTIC FORMULA PO) Take 1 capsule by mouth in the morning.    [provider]  busPIRone  (BUSPAR ) 5 MG tablet Take 5 mg by mouth in the morning and at bedtime.    [provider]  cetirizine (ZYRTEC) 10 MG tablet Take 10 mg by mouth at bedtime.    [provider]  cholecalciferol  (VITAMIN D3) 25 MCG (1000 UNIT) tablet Take 1,000 Units by mouth daily.    [provider]  Cranberry-Vitamin C-Probiotic (AZO CRANBERRY PO) Take 1 tablet by mouth daily.    [provider]  FLUoxetine  (PROZAC ) 40 MG capsule Take 40 mg by mouth daily.    [provider]  folic acid  (FOLVITE ) 1 MG tablet Take 1 mg by mouth in the morning.    [provider]   HYDROcodone -acetaminophen  (NORCO/VICODIN) 5-325 MG tablet Take 0.5-1 tablets by mouth every 6 (six) hours as needed (for pain).    [provider]  lidocaine  4 % Place 1 patch onto the skin daily. To lower back. 12 hours on and 12 hours off    [provider]  Multiple Vitamin (DAILY VITE PO) Take 1 tablet by mouth daily.    [provider]  ondansetron  (ZOFRAN ) 4 MG tablet Take 4 mg by mouth at bedtime as needed for nausea or vomiting.    [provider]  pantoprazole  (PROTONIX ) 20 MG tablet Take 20 mg by mouth daily.    [provider]  pantoprazole  (PROTONIX ) 40 MG tablet Take 40 mg by mouth in the morning.    [provider]  Phenol-Glycerin (CHLORASEPTIC MAX SORE THROAT) 1.5-33 % LIQD Use as directed 1 spray in the mouth or throat every 2 (two) hours as needed (for a sore throat).    [provider]  polyethylene glycol (MIRALAX  / GLYCOLAX ) 17 g packet Take 17 g by mouth daily as needed for mild constipation, moderate constipation or severe constipation.    [provider]  Skin Protectants, Misc. (BAZA PROTECT EX) Apply 1 application  topically See admin instructions. Apply to the buttocks 2 times a day    [provider]  zolpidem  (AMBIEN ) 5 MG tablet Take 1 tablet (5  mg total) by mouth at bedtime. 12/28/22   Krishnan, Gokul, MD    Allergies: Patient has no known allergies.    Review of Systems  All other systems reviewed and are negative.   Updated Vital Signs BP (!) 153/60   Pulse 74   Temp 98.6 F (37 C)   Resp 18   SpO2 96%   Physical Exam Vitals and nursing note reviewed.  Constitutional:      Appearance: She is well-developed.  HENT:     Head: Normocephalic.     Comments: 1.5 cm laceration over right periorbital region with local soft tissue swelling and ecchymosis.  EOMI. Visual fields grossly intact.  Neck:     Comments: No midline cervical spine tenderness Cardiovascular:     Rate and  Rhythm: Normal rate and regular rhythm.     Heart sounds: No murmur heard. Pulmonary:     Effort: Pulmonary effort is normal. No respiratory distress.     Breath sounds: Normal breath sounds.  Abdominal:     Palpations: Abdomen is soft.     Tenderness: There is no abdominal tenderness. There is no guarding or rebound.  Musculoskeletal:     Comments: Mild tenderness over left hip.  LLE externally rotated and shortened (pt reports this is chronic).   Skin:    General: Skin is warm and dry.  Neurological:     Mental Status: She is alert.     Comments: Oriented to person, hospital, recent events.  Disoriented to time.  Mildly confused.   Psychiatric:        Behavior: Behavior normal.     (all labs ordered are listed, but only abnormal results are displayed) Labs Reviewed  URINALYSIS, ROUTINE W REFLEX MICROSCOPIC - Abnormal; Notable for the following components:      Result Value   Color, Urine AMBER (*)    APPearance HAZY (*)    Protein, ur 30 (*)    Leukocytes,Ua LARGE (*)    All other components within normal limits  COMPREHENSIVE METABOLIC PANEL WITH GFR - Abnormal; Notable for the following components:   CO2 21 (*)    Glucose, Bld 100 (*)    BUN 35 (*)    Creatinine, Ser 1.18 (*)    AST 76 (*)    ALT 58 (*)    GFR, Estimated 42 (*)    All other components within normal limits  URINE CULTURE  CBC WITH DIFFERENTIAL/PLATELET  CBC WITH DIFFERENTIAL/PLATELET    EKG: None  Radiology: US  Abdomen Limited Result Date: 05/27/2024 EXAM: Right Upper Quadrant Abdominal Ultrasound 05/27/2024 05:06:00 AM TECHNIQUE: Real-time ultrasonography of the right upper quadrant of the abdomen was performed. COMPARISON: CT Abdomen Pelvis 04/02/2022. CLINICAL HISTORY: elevated lfts FINDINGS: LIVER: The liver demonstrates normal echogenicity. No intrahepatic biliary ductal dilatation. No evidence of mass. BILIARY SYSTEM: Gallbladder wall thickness measures 0.3 cm. No pericholecystic fluid or wall  thickening. No cholelithiasis. The common bile duct measures 0.3 cm. OTHER: No right upper quadrant ascites. IMPRESSION: 1. No acute findings. Electronically signed by: Evalene Coho MD 05/27/2024 05:35 AM EST RP Workstation: [id number]   CT Head Wo Contrast Result Date: 05/27/2024 EXAM: CT HEAD WITHOUT CONTRAST 05/27/2024 12:58:56 AM TECHNIQUE: CT of the head was performed without the administration of intravenous contrast. Automated exposure control, iterative reconstruction, and/or weight based adjustment of the mA/kV was utilized to reduce the radiation dose to as low as reasonably achievable. COMPARISON: 12/11/2023 CLINICAL HISTORY: Head trauma, minor (Age >= 65y). FINDINGS: BRAIN AND VENTRICLES:  No acute hemorrhage. No evidence of acute infarct. Proportional prominence of ventricles and sulci, consistent with diffuse cerebral parenchymal volume loss. Scattered periventricular white matter hypoattenuation, consistent with mild chronic ischemic microvascular disease. 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 No hydrocephalus. No extra-axial collection. No mass effect or midline shift. Calcified atherosclerotic plaque within cavernous/supraclinoid ICA and intradural vertebral arteries. ORBITS: Left lens replacement. SINUSES: No acute abnormality. SOFT TISSUES AND SKULL: Right periorbital soft tissue swelling with 4mm hematoma. No skull fracture. IMPRESSION: 1. No acute intracranial abnormality related to the head trauma. Electronically signed by: Morgane Naveau MD 05/27/2024 01:09 AM EST RP Workstation: [id number]   CT Cervical Spine Wo Contrast Result Date: 05/27/2024 EXAM: CT CERVICAL SPINE WITHOUT CONTRAST 05/27/2024 12:58:56 AM TECHNIQUE: CT of the cervical spine was performed without the administration of intravenous contrast. Multiplanar reformatted images are provided for review. Automated exposure control, iterative reconstruction, and/or weight  based adjustment of the mA/kV was utilized to reduce the radiation dose to as low as reasonably achievable. COMPARISON: None available. CLINICAL HISTORY: Neck trauma (Age >= 65y) FINDINGS: CERVICAL SPINE: BONES AND ALIGNMENT: Diffusely decreased bone density. Grade 1 anterolisthesis of C6 on C7. No acute fracture or traumatic malalignment. DEGENERATIVE CHANGES: No significant degenerative changes. SOFT TISSUES: Atherosclerotic carotid arteries within the neck. No prevertebral soft tissue swelling. IMPRESSION: 1. No acute abnormality of the cervical spine. Electronically signed by: Morgane Naveau MD 05/27/2024 01:07 AM EST RP Workstation: [id number]   DG Hip Unilat W or Wo Pelvis 2-3 Views Left Result Date: 05/27/2024 EXAM: 2 OR MORE VIEW(S) XRAY OF THE BILATERAL HIPS 05/27/2024 12:51:00 AM COMPARISON: 07/12/2023 CLINICAL HISTORY: fall FINDINGS: BONES AND JOINTS: Left hip hemiarthroplasty in place. Stable anchors in pubic bones. Old healed right pubic rami fractures. Moderate to severe right hip osteoarthritis. No acute fracture. No malalignment. LUMBAR SPINE: Degenerative changes in lower lumbar spine. SOFT TISSUES: The soft tissues are unremarkable. IMPRESSION: 1. No acute findings. 2. Left hip hemiarthroplasty in place. 3. Moderate to severe right hip osteoarthritis. Electronically signed by: Morgane Naveau MD 05/27/2024 12:56 AM EST RP Workstation: [id number]     .Laceration Repair  Date/Time: 05/27/2024 4:39 AM  Performed by: Griselda Norris, MD Authorized by: Griselda Norris, MD   Consent:    Consent obtained:  Verbal   Consent given by:  Patient and guardian   Risks discussed:  Infection, pain and poor cosmetic result Universal protocol:    Patient identity confirmed:  Verbally with patient Laceration details:    Location:  Face   Face location:  R eyebrow   Length (cm):  1.5 Exploration:    Hemostasis achieved with:  LET Treatment:    Area cleansed with:  Chlorhexidine  and saline    Amount of cleaning:  Standard Skin repair:    Repair method:  Tissue adhesive Repair type:    Repair type:  Simple Post-procedure details:    Dressing:  Open (no dressing)   Procedure completion:  Tolerated well, no immediate complications    Medications Ordered in the ED  lidocaine -EPINEPHrine -tetracaine  (LET) topical gel (3 mLs Topical Given 05/27/24 0032)  sodium chloride  0.9 % bolus 250 mL (0 mLs Intravenous Stopped 05/27/24 9386)                                    Medical Decision Making Amount and/or Complexity of Data Reviewed Labs: ordered. Radiology: ordered.   Patient  here for evaluation following unwitnessed fall.  Has chronic memory issues.  No reported acute illnesses or processes.  CT head is negative for acute abnormality.  She does have a superficial laceration to the right periorbital region, this was repaired per note.  UA is not consistent with UTI.  Labs with mild elevation in transaminases, creatinine.  She was treated with gentle IV fluid titration.  She does not have any abdominal pain.  Right upper quadrant ultrasound is negative for acute abnormality.  Discussed with patient's daughter findings of studies over the phone.  Discussed encouraging oral fluid hydration.  Feel she is stable for discharge back to her facility with outpatient follow-up and return precautions.     Final diagnoses:  Fall, initial encounter  Facial laceration, initial encounter  Dehydration    ED Discharge Orders     None          Griselda Norris, MD 05/27/24 [phone]

## 2024-05-27 NOTE — ED Notes (Signed)
 Patient transported to CT and returned to room.

## 2024-05-27 NOTE — ED Notes (Signed)
 [facility] called and transport arranged.

## 2024-05-29 LAB — URINE CULTURE: Culture: >=100000 — AB

## 2024-05-30 ENCOUNTER — Telehealth (HOSPITAL_BASED_OUTPATIENT_CLINIC_OR_DEPARTMENT_OTHER): Payer: Self-pay | Admitting: *Deleted

## 2024-05-30 NOTE — Progress Notes (Signed)
 ED Antimicrobial Stewardship Positive Culture Follow Up   Evelyn [name] is an 89 y.o. female who presented to [facility] on 05/26/2024 with a chief complaint of  Chief Complaint  Patient presents with   Fall    Recent Results (from the past 720 hours)  Urine Culture     Status: Abnormal   Collection Time: 05/27/24  2:45 AM   Specimen: Urine, Clean Catch  Result Value Ref Range Status   Specimen Description   Final    URINE, CLEAN CATCH Performed at [facility], [address] [place], [place] [address]    Special Requests   Final    NONE Performed at [facility], [address] [place], [place] [address]    Culture >=100,000 COLONIES/mL PROTEUS MIRABILIS (A)  Final   Report Status 05/29/2024 FINAL  Final   Organism ID, Bacteria PROTEUS MIRABILIS (A)  Final      Susceptibility   Proteus mirabilis - MIC*    AMPICILLIN <=2 SENSITIVE Sensitive     CEFAZOLIN  (URINE) Value in next row Sensitive      2 SENSITIVEThis is a modified FDA-approved test that has been validated and its performance characteristics determined by the reporting laboratory.  This laboratory is certified under the Clinical Laboratory Improvement Amendments CLIA as qualified to perform high complexity clinical laboratory testing.    CEFEPIME Value in next row Sensitive      2 SENSITIVEThis is a modified FDA-approved test that has been validated and its performance characteristics determined by the reporting laboratory.  This laboratory is certified under the Clinical Laboratory Improvement Amendments CLIA as qualified to perform high complexity clinical laboratory testing.    ERTAPENEM Value in next row Sensitive      2 SENSITIVEThis is a modified FDA-approved test that has been validated and its performance characteristics determined by the reporting laboratory.  This laboratory is certified under the Clinical Laboratory Improvement Amendments CLIA as qualified to perform high  complexity clinical laboratory testing.    CEFTRIAXONE  Value in next row Sensitive      2 SENSITIVEThis is a modified FDA-approved test that has been validated and its performance characteristics determined by the reporting laboratory.  This laboratory is certified under the Clinical Laboratory Improvement Amendments CLIA as qualified to perform high complexity clinical laboratory testing.    CIPROFLOXACIN  Value in next row Sensitive      2 SENSITIVEThis is a modified FDA-approved test that has been validated and its performance characteristics determined by the reporting laboratory.  This laboratory is certified under the Clinical Laboratory Improvement Amendments CLIA as qualified to perform high complexity clinical laboratory testing.    GENTAMICIN Value in next row Sensitive      2 SENSITIVEThis is a modified FDA-approved test that has been validated and its performance characteristics determined by the reporting laboratory.  This laboratory is certified under the Clinical Laboratory Improvement Amendments CLIA as qualified to perform high complexity clinical laboratory testing.    NITROFURANTOIN  Value in next row Resistant      2 SENSITIVEThis is a modified FDA-approved test that has been validated and its performance characteristics determined by the reporting laboratory.  This laboratory is certified under the Clinical Laboratory Improvement Amendments CLIA as qualified to perform high complexity clinical laboratory testing.    TRIMETH /SULFA  Value in next row Sensitive      2 SENSITIVEThis is a modified FDA-approved test that has been validated and its performance characteristics determined by the reporting laboratory.  This laboratory is certified under the Clinical Laboratory Improvement Amendments CLIA as qualified to perform high complexity clinical laboratory testing.    AMPICILLIN/SULBACTAM Value in next row Sensitive      2 SENSITIVEThis is a modified FDA-approved test that has been  validated and its performance characteristics determined by the reporting laboratory.  This laboratory is certified under the Clinical Laboratory Improvement Amendments CLIA as qualified to perform high complexity clinical laboratory testing.    PIP/TAZO Value in next row Sensitive      <=4 SENSITIVEThis is a modified FDA-approved test that has been validated and its performance characteristics determined by the reporting laboratory.  This laboratory is certified under the Clinical Laboratory Improvement Amendments CLIA as qualified to perform high complexity clinical laboratory testing.    MEROPENEM Value in next row Sensitive      <=4 SENSITIVEThis is a modified FDA-approved test that has been validated and its performance characteristics determined by the reporting laboratory.  This laboratory is certified under the Clinical Laboratory Improvement Amendments CLIA as qualified to perform high complexity clinical laboratory testing.    * >=100,000 COLONIES/mL PROTEUS MIRABILIS    Patient coming in with unwitnessed fall from facility. No other symptoms reported. 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 No treatment indicated.  ED Provider: Lynwood Moulder, MD    Damien Quiet, PharmD, BCPS, BCIDP Infectious Diseases Clinical Pharmacist Phone: [phone] 05/30/2024, 8:46 AM

## 2024-05-30 NOTE — Telephone Encounter (Signed)
 Post ED Visit - Positive Culture Follow-up  Culture report reviewed by antimicrobial stewardship pharmacist: [facility] []  Rankin Dee, Pharm.D. []  Venetia Gully, Pharm.D., BCPS AQ-ID []  Garrel Crews, Pharm.D., BCPS []  Almarie Lunger, Pharm.D., BCPS []  Mitchell, [address].D., BCPS, AAHIVP []  Rosaline Bihari, Pharm.D., BCPS, AAHIVP []  Vernell Meier, PharmD, BCPS []  Latanya Hint, PharmD, BCPS []  Donald Medley, PharmD, BCPS []  Rocky Bold, PharmD []  Dorothyann Alert, PharmD, BCPS []  Morene Babe, PharmD  [facility] [x]  Damien Quiet, PharmD []  Romona Bliss, PharmD []  Dolphus Roller, PharmD []  Veva Seip, Rph []  Vernell Daunt) Leonce, PharmD []  Eva Allis, PharmD []  Rosaline Millet, PharmD []  Iantha Batch, PharmD []  Arvin Gauss, PharmD []  Wanda Hasting, PharmD []  Ronal Rav, PharmD []  Rocky Slade, PharmD []  Bard Jeans, PharmD   Positive urine culture reviewed by Esmond Moulder. Pt is asymptomatic, no treatment and no further patient follow-up is required at this time.  Jama Wyman Kipper 05/30/2024, 10:11 AM

## 2024-06-12 ENCOUNTER — Inpatient Hospital Stay (HOSPITAL_COMMUNITY): Admission: EM | Admit: 2024-06-12 | Discharge: 2024-06-21 | DRG: 871 | Disposition: E | Source: Skilled Nursing Facility

## 2024-06-12 ENCOUNTER — Inpatient Hospital Stay (HOSPITAL_COMMUNITY)

## 2024-06-12 ENCOUNTER — Other Ambulatory Visit: Payer: Self-pay

## 2024-06-12 ENCOUNTER — Emergency Department (HOSPITAL_COMMUNITY)

## 2024-06-12 DIAGNOSIS — J1008 Influenza due to other identified influenza virus with other specified pneumonia: Secondary | ICD-10-CM | POA: Diagnosis present

## 2024-06-12 DIAGNOSIS — Z515 Encounter for palliative care: Secondary | ICD-10-CM

## 2024-06-12 DIAGNOSIS — R630 Anorexia: Secondary | ICD-10-CM | POA: Diagnosis present

## 2024-06-12 DIAGNOSIS — J9601 Acute respiratory failure with hypoxia: Principal | ICD-10-CM | POA: Diagnosis present

## 2024-06-12 DIAGNOSIS — J189 Pneumonia, unspecified organism: Secondary | ICD-10-CM

## 2024-06-12 DIAGNOSIS — J129 Viral pneumonia, unspecified: Secondary | ICD-10-CM | POA: Diagnosis present

## 2024-06-12 DIAGNOSIS — M1990 Unspecified osteoarthritis, unspecified site: Secondary | ICD-10-CM | POA: Diagnosis present

## 2024-06-12 DIAGNOSIS — G9341 Metabolic encephalopathy: Secondary | ICD-10-CM | POA: Diagnosis present

## 2024-06-12 DIAGNOSIS — Z8711 Personal history of peptic ulcer disease: Secondary | ICD-10-CM

## 2024-06-12 DIAGNOSIS — Z66 Do not resuscitate: Secondary | ICD-10-CM | POA: Diagnosis present

## 2024-06-12 DIAGNOSIS — I509 Heart failure, unspecified: Secondary | ICD-10-CM | POA: Diagnosis present

## 2024-06-12 DIAGNOSIS — H548 Legal blindness, as defined in USA: Secondary | ICD-10-CM | POA: Diagnosis present

## 2024-06-12 DIAGNOSIS — I447 Left bundle-branch block, unspecified: Secondary | ICD-10-CM | POA: Diagnosis present

## 2024-06-12 DIAGNOSIS — R0602 Shortness of breath: Secondary | ICD-10-CM | POA: Diagnosis present

## 2024-06-12 DIAGNOSIS — I493 Ventricular premature depolarization: Secondary | ICD-10-CM | POA: Diagnosis present

## 2024-06-12 DIAGNOSIS — A419 Sepsis, unspecified organism: Secondary | ICD-10-CM

## 2024-06-12 DIAGNOSIS — R54 Age-related physical debility: Secondary | ICD-10-CM | POA: Diagnosis present

## 2024-06-12 DIAGNOSIS — Z1152 Encounter for screening for COVID-19: Secondary | ICD-10-CM | POA: Diagnosis not present

## 2024-06-12 DIAGNOSIS — K219 Gastro-esophageal reflux disease without esophagitis: Secondary | ICD-10-CM | POA: Diagnosis present

## 2024-06-12 DIAGNOSIS — E785 Hyperlipidemia, unspecified: Secondary | ICD-10-CM | POA: Diagnosis present

## 2024-06-12 DIAGNOSIS — N3021 Other chronic cystitis with hematuria: Secondary | ICD-10-CM | POA: Diagnosis present

## 2024-06-12 DIAGNOSIS — E872 Acidosis, unspecified: Secondary | ICD-10-CM | POA: Diagnosis present

## 2024-06-12 DIAGNOSIS — J101 Influenza due to other identified influenza virus with other respiratory manifestations: Secondary | ICD-10-CM

## 2024-06-12 DIAGNOSIS — R6520 Severe sepsis without septic shock: Secondary | ICD-10-CM | POA: Diagnosis present

## 2024-06-12 DIAGNOSIS — Z87440 Personal history of urinary (tract) infections: Secondary | ICD-10-CM

## 2024-06-12 DIAGNOSIS — I4719 Other supraventricular tachycardia: Secondary | ICD-10-CM | POA: Diagnosis present

## 2024-06-12 DIAGNOSIS — A4189 Other specified sepsis: Principal | ICD-10-CM | POA: Diagnosis present

## 2024-06-12 DIAGNOSIS — I2489 Other forms of acute ischemic heart disease: Secondary | ICD-10-CM | POA: Diagnosis present

## 2024-06-12 DIAGNOSIS — E871 Hypo-osmolality and hyponatremia: Secondary | ICD-10-CM | POA: Diagnosis present

## 2024-06-12 DIAGNOSIS — R7989 Other specified abnormal findings of blood chemistry: Secondary | ICD-10-CM

## 2024-06-12 DIAGNOSIS — Z7982 Long term (current) use of aspirin: Secondary | ICD-10-CM | POA: Diagnosis not present

## 2024-06-12 DIAGNOSIS — Z96642 Presence of left artificial hip joint: Secondary | ICD-10-CM | POA: Diagnosis present

## 2024-06-12 DIAGNOSIS — Z8673 Personal history of transient ischemic attack (TIA), and cerebral infarction without residual deficits: Secondary | ICD-10-CM | POA: Diagnosis not present

## 2024-06-12 DIAGNOSIS — Z961 Presence of intraocular lens: Secondary | ICD-10-CM | POA: Diagnosis present

## 2024-06-12 DIAGNOSIS — F32A Depression, unspecified: Secondary | ICD-10-CM | POA: Diagnosis present

## 2024-06-12 DIAGNOSIS — Z90710 Acquired absence of both cervix and uterus: Secondary | ICD-10-CM

## 2024-06-12 DIAGNOSIS — Z9842 Cataract extraction status, left eye: Secondary | ICD-10-CM

## 2024-06-12 LAB — RESP PANEL BY RT-PCR (RSV, FLU A&B, COVID)  RVPGX2
Influenza A by PCR: POSITIVE — AB
Influenza B by PCR: NEGATIVE
Resp Syncytial Virus by PCR: NEGATIVE
SARS Coronavirus 2 by RT PCR: NEGATIVE

## 2024-06-12 LAB — BLOOD GAS, ARTERIAL
Acid-base deficit: 9.4 mmol/L — ABNORMAL HIGH (ref 0.0–2.0)
Bicarbonate: 13.3 mmol/L — ABNORMAL LOW (ref 20.0–28.0)
Drawn by: 31394
O2 Content: 6 L/min
O2 Saturation: 94.1 %
Patient temperature: 36.7
pCO2 arterial: 21 mmHg — ABNORMAL LOW (ref 32–48)
pH, Arterial: 7.41 (ref 7.35–7.45)
pO2, Arterial: 66 mmHg — ABNORMAL LOW (ref 83–108)

## 2024-06-12 LAB — CBC
HCT: 35.6 % — ABNORMAL LOW (ref 36.0–46.0)
Hemoglobin: 11.5 g/dL — ABNORMAL LOW (ref 12.0–15.0)
MCH: 30.8 pg (ref 26.0–34.0)
MCHC: 32.3 g/dL (ref 30.0–36.0)
MCV: 95.4 fL (ref 80.0–100.0)
Platelets: 211 K/uL (ref 150–400)
RBC: 3.73 MIL/uL — ABNORMAL LOW (ref 3.87–5.11)
RDW: 14.2 % (ref 11.5–15.5)
WBC: 18.3 K/uL — ABNORMAL HIGH (ref 4.0–10.5)
nRBC: 0 % (ref 0.0–0.2)

## 2024-06-12 LAB — BASIC METABOLIC PANEL WITH GFR
Anion gap: 18 — ABNORMAL HIGH (ref 5–15)
BUN: 29 mg/dL — ABNORMAL HIGH (ref 8–23)
CO2: 14 mmol/L — ABNORMAL LOW (ref 22–32)
Calcium: 8.9 mg/dL (ref 8.9–10.3)
Chloride: 97 mmol/L — ABNORMAL LOW (ref 98–111)
Creatinine, Ser: 0.93 mg/dL (ref 0.44–1.00)
GFR, Estimated: 56 mL/min — ABNORMAL LOW
Glucose, Bld: 129 mg/dL — ABNORMAL HIGH (ref 70–99)
Potassium: 4.4 mmol/L (ref 3.5–5.1)
Sodium: 129 mmol/L — ABNORMAL LOW (ref 135–145)

## 2024-06-12 LAB — PROTIME-INR
INR: 1.1 (ref 0.8–1.2)
Prothrombin Time: 14.5 s (ref 11.4–15.2)

## 2024-06-12 LAB — I-STAT CG4 LACTIC ACID, ED
Lactic Acid, Venous: 3.6 mmol/L (ref 0.5–1.9)
Lactic Acid, Venous: 5 mmol/L (ref 0.5–1.9)

## 2024-06-12 LAB — PRO BRAIN NATRIURETIC PEPTIDE: Pro Brain Natriuretic Peptide: 9697 pg/mL — ABNORMAL HIGH

## 2024-06-12 LAB — TROPONIN T, HIGH SENSITIVITY
Troponin T High Sensitivity: 296 ng/L (ref 0–19)
Troponin T High Sensitivity: 266 ng/L (ref 0–19)

## 2024-06-12 MED ORDER — OSELTAMIVIR PHOSPHATE 75 MG PO CAPS
75.0000 mg | ORAL_CAPSULE | Freq: Two times a day (BID) | ORAL | Status: DC
  Filled 2024-06-12: qty 1

## 2024-06-12 MED ORDER — MORPHINE SULFATE (PF) 2 MG/ML IV SOLN
0.5000 mg | Freq: Once | INTRAVENOUS | Status: AC
  Administered 2024-06-12: 0.5 mg via INTRAVENOUS
  Filled 2024-06-12: qty 1

## 2024-06-12 MED ORDER — MIDAZOLAM-SODIUM CHLORIDE 100-0.9 MG/100ML-% IV SOLN
0.0000 mg/h | INTRAVENOUS | Status: DC
  Administered 2024-06-12: 1 mg/h via INTRAVENOUS
  Filled 2024-06-12: qty 100

## 2024-06-12 MED ORDER — LACTATED RINGERS IV BOLUS (SEPSIS)
1000.0000 mL | Freq: Once | INTRAVENOUS | Status: AC
  Administered 2024-06-12: 1000 mL via INTRAVENOUS

## 2024-06-12 MED ORDER — CHLORHEXIDINE GLUCONATE CLOTH 2 % EX PADS
6.0000 | MEDICATED_PAD | Freq: Every day | CUTANEOUS | Status: DC
  Administered 2024-06-12: 6 via TOPICAL

## 2024-06-12 MED ORDER — POLYETHYLENE GLYCOL 3350 17 G PO PACK: 17.0000 g | PACK | Freq: Every day | ORAL | Status: DC | PRN

## 2024-06-12 MED ORDER — IPRATROPIUM-ALBUTEROL 0.5-2.5 (3) MG/3ML IN SOLN: 3.0000 mL | RESPIRATORY_TRACT | Status: DC

## 2024-06-12 MED ORDER — ONDANSETRON HCL 4 MG/2ML IJ SOLN: 4.0000 mg | Freq: Four times a day (QID) | INTRAMUSCULAR | Status: DC | PRN

## 2024-06-12 MED ORDER — MIDAZOLAM HCL (PF) 2 MG/2ML IJ SOLN
2.0000 mg | INTRAMUSCULAR | Status: DC | PRN
  Administered 2024-06-12: 4 mg via INTRAVENOUS
  Filled 2024-06-12 (×2): qty 2

## 2024-06-12 MED ORDER — LORAZEPAM 2 MG/ML IJ SOLN
0.2500 mg | Freq: Once | INTRAMUSCULAR | Status: AC
  Administered 2024-06-12: 0.25 mg via INTRAVENOUS
  Filled 2024-06-12: qty 1

## 2024-06-12 MED ORDER — IPRATROPIUM-ALBUTEROL 0.5-2.5 (3) MG/3ML IN SOLN: 3.0000 mL | Freq: Four times a day (QID) | RESPIRATORY_TRACT | Status: DC | PRN

## 2024-06-12 MED ORDER — IPRATROPIUM-ALBUTEROL 0.5-2.5 (3) MG/3ML IN SOLN
RESPIRATORY_TRACT | Status: AC
  Administered 2024-06-12: 3 mL via RESPIRATORY_TRACT
  Filled 2024-06-12: qty 3

## 2024-06-12 MED ORDER — ACETAMINOPHEN 650 MG RE SUPP: 650.0000 mg | Freq: Four times a day (QID) | RECTAL | Status: DC | PRN

## 2024-06-12 MED ORDER — IPRATROPIUM-ALBUTEROL 0.5-2.5 (3) MG/3ML IN SOLN
3.0000 mL | Freq: Once | RESPIRATORY_TRACT | Status: AC
  Administered 2024-06-12: 3 mL via RESPIRATORY_TRACT
  Filled 2024-06-12: qty 3

## 2024-06-12 MED ORDER — SODIUM CHLORIDE 0.9 % IV SOLN: 1.0000 g | INTRAVENOUS | Status: DC

## 2024-06-12 MED ORDER — GLYCOPYRROLATE 1 MG PO TABS: 1.0000 mg | ORAL_TABLET | ORAL | Status: DC | PRN

## 2024-06-12 MED ORDER — SODIUM CHLORIDE 0.9 % IV SOLN: 500.0000 mg | INTRAVENOUS | Status: DC

## 2024-06-12 MED ORDER — INSULIN ASPART 100 UNIT/ML IJ SOLN: 0.0000 [IU] | INTRAMUSCULAR | Status: DC

## 2024-06-12 MED ORDER — POLYVINYL ALCOHOL 1.4 % OP SOLN: 1.0000 [drp] | Freq: Four times a day (QID) | OPHTHALMIC | Status: DC | PRN

## 2024-06-12 MED ORDER — SODIUM CHLORIDE 0.9 % IV SOLN
1.0000 g | Freq: Once | INTRAVENOUS | Status: AC
  Administered 2024-06-12: 1 g via INTRAVENOUS
  Filled 2024-06-12: qty 10

## 2024-06-12 MED ORDER — MORPHINE 100MG IN NS 100ML (1MG/ML) PREMIX INFUSION
0.0000 mg/h | INTRAVENOUS | Status: DC
  Administered 2024-06-12: 5 mg/h via INTRAVENOUS
  Administered 2024-06-12: 20 mg/h via INTRAVENOUS
  Filled 2024-06-12 (×2): qty 100

## 2024-06-12 MED ORDER — SODIUM CHLORIDE 3 % IN NEBU
4.0000 mL | INHALATION_SOLUTION | Freq: Four times a day (QID) | RESPIRATORY_TRACT | Status: DC
  Filled 2024-06-12 (×2): qty 4

## 2024-06-12 MED ORDER — LACTATED RINGERS IV BOLUS: 500.0000 mL | Freq: Once | INTRAVENOUS | Status: DC

## 2024-06-12 MED ORDER — GLYCOPYRROLATE 0.2 MG/ML IJ SOLN: 0.2000 mg | INTRAMUSCULAR | Status: DC | PRN

## 2024-06-12 MED ORDER — MIDAZOLAM HCL (PF) 2 MG/2ML IJ SOLN: 1.0000 mg | INTRAMUSCULAR | Status: DC | PRN

## 2024-06-12 MED ORDER — PANTOPRAZOLE SODIUM 40 MG PO TBEC
40.0000 mg | DELAYED_RELEASE_TABLET | Freq: Every day | ORAL | Status: DC
  Filled 2024-06-12: qty 1

## 2024-06-12 MED ORDER — ACETAMINOPHEN 325 MG PO TABS: 650.0000 mg | ORAL_TABLET | Freq: Four times a day (QID) | ORAL | Status: DC | PRN

## 2024-06-12 MED ORDER — FUROSEMIDE 10 MG/ML IJ SOLN
40.0000 mg | Freq: Once | INTRAMUSCULAR | Status: AC
  Administered 2024-06-12: 40 mg via INTRAVENOUS
  Filled 2024-06-12: qty 4

## 2024-06-12 MED ORDER — SODIUM CHLORIDE 3 % IN NEBU
4.0000 mL | INHALATION_SOLUTION | RESPIRATORY_TRACT | Status: DC
  Filled 2024-06-12 (×2): qty 4

## 2024-06-12 MED ORDER — MORPHINE BOLUS VIA INFUSION
5.0000 mg | INTRAVENOUS | Status: DC | PRN
  Administered 2024-06-12 (×8): 5 mg via INTRAVENOUS

## 2024-06-12 MED ORDER — MORPHINE SULFATE (PF) 2 MG/ML IV SOLN
2.0000 mg | INTRAVENOUS | Status: DC | PRN
  Administered 2024-06-12: 4 mg via INTRAVENOUS
  Filled 2024-06-12: qty 2

## 2024-06-12 MED ORDER — GLYCOPYRROLATE 0.2 MG/ML IJ SOLN
0.2000 mg | INTRAMUSCULAR | Status: DC | PRN
  Administered 2024-06-12: 0.2 mg via INTRAVENOUS
  Filled 2024-06-12: qty 1

## 2024-06-12 MED ORDER — SODIUM CHLORIDE 3 % IN NEBU
4.0000 mL | INHALATION_SOLUTION | Freq: Two times a day (BID) | RESPIRATORY_TRACT | Status: DC
  Filled 2024-06-12: qty 4

## 2024-06-12 MED ORDER — IOHEXOL 350 MG/ML SOLN
75.0000 mL | Freq: Once | INTRAVENOUS | Status: AC | PRN
  Administered 2024-06-12: 75 mL via INTRAVENOUS

## 2024-06-12 MED ORDER — HEPARIN SODIUM (PORCINE) 5000 UNIT/ML IJ SOLN: 5000.0000 [IU] | Freq: Three times a day (TID) | INTRAMUSCULAR | Status: DC

## 2024-06-12 MED ORDER — SODIUM CHLORIDE 0.9 % IV SOLN
500.0000 mg | Freq: Once | INTRAVENOUS | Status: AC
  Administered 2024-06-12: 500 mg via INTRAVENOUS
  Filled 2024-06-12: qty 5

## 2024-06-12 MED ORDER — ACETAMINOPHEN 500 MG PO TABS
1000.0000 mg | ORAL_TABLET | Freq: Once | ORAL | Status: AC
  Administered 2024-06-12: 1000 mg via ORAL
  Filled 2024-06-12: qty 2

## 2024-06-12 MED ORDER — DOCUSATE SODIUM 100 MG PO CAPS: 100.0000 mg | ORAL_CAPSULE | Freq: Two times a day (BID) | ORAL | Status: DC | PRN

## 2024-06-12 MED ORDER — HYDROCORTISONE SOD SUC (PF) 100 MG IJ SOLR
100.0000 mg | Freq: Two times a day (BID) | INTRAMUSCULAR | Status: DC
  Administered 2024-06-12: 100 mg via INTRAVENOUS
  Filled 2024-06-12: qty 2

## 2024-06-12 MED ORDER — SODIUM CHLORIDE 0.9 % IV SOLN: INTRAVENOUS | Status: DC

## 2024-06-12 MED ORDER — MIDAZOLAM BOLUS VIA INFUSION (WITHDRAWAL LIFE SUSTAINING TX)
2.0000 mg | INTRAVENOUS | Status: DC | PRN
  Administered 2024-06-12 (×6): 2 mg via INTRAVENOUS

## 2024-06-12 MED ORDER — IPRATROPIUM-ALBUTEROL 0.5-2.5 (3) MG/3ML IN SOLN: 3.0000 mL | Freq: Four times a day (QID) | RESPIRATORY_TRACT | Status: DC

## 2024-06-12 MED ORDER — ASPIRIN 81 MG PO CHEW
324.0000 mg | CHEWABLE_TABLET | Freq: Once | ORAL | Status: AC
  Administered 2024-06-12: 324 mg via ORAL
  Filled 2024-06-12: qty 4

## 2024-06-17 LAB — CULTURE, BLOOD (ROUTINE X 2)
Culture: NO GROWTH
Culture: NO GROWTH

## 2024-06-21 NOTE — ED Notes (Signed)
" °   07-03-2024 1102  Reason for Communication  Reason for Communication Critical Result  Critical Result  Date Critical Result Received July 03, 2024  Time Critical Result Received 1102  Test performed and critical result (S)  Troponin 296  Provider Name/Title (S)  Dr. Gennaro  Date Provider Notified 07-03-24  Time Provider Notified 1102  Notification Type Page  Provider response In department     "

## 2024-06-21 NOTE — Progress Notes (Signed)
 Patient transported on BiPAP to 1231 with no complications. Vitals remained stable.

## 2024-06-21 NOTE — H&P (Addendum)
 "  NAME:  HALEY FUERSTENBERG, MRN:  [id number], DOB:  08/06/1924, LOS: 0 ADMISSION DATE:  06-29-2024, CONSULTATION DATE:  06-29-24 REFERRING MD:  Dr. Gennaro, CHIEF COMPLAINT:  acute hypoxic respiratory failure, Flu A   History of Present Illness:  Patient is confused history is obtained via chart review and patient's son   Ms. Franchot is a 89 year old female with history of depression, GERD/PUD, legally blind, recurrent UTI, CVA and who presented to ED from [place]  with hypoxia and shortness of breath. EMS found her to have SpO2 in the 70's on room air. In the ED she was found to be Flu A positive and CT PE although negative for PE showed ground-glass attentuation throughout both lung fields and large hiatal hernia. She continued to have increased work of breathing on high flow nasal cannula and elevated troponin. PCCM was consulted for ICU admission.   Son claims she had worsening fatigue on Sunday and began to have difficulty breathing, congestion, wheeze and cough over the past 2 days. He says many people in the nursing home have been sick with different viruses but not Flu to his knowledge. Her breathing has been labored but had been getting worse. She has had poor appetite as well over the past few days but son has been encouraging her to hydrate and drink water. She was complaining of lower abdominal pain last night which he thought may have be related to constipation and nursing home staff administered a suppository.   Pertinent  Medical History  Per above  Significant Hospital Events: Including procedures, antibiotic start and stop dates in addition to other pertinent events   29-Jun-2024: Admitted for acute hypoxic respiratory failure due to viral pneumonia (Flu A)  Interim History / Subjective:  Admitted for acute hypoxic respiratory failure and Flu A. Patient is confused but endorses significant shortness of breath.  Objective    Blood pressure 92/69, pulse 93, temperature (!) 101.1  F (38.4 C), temperature source Rectal, resp. rate (!) 40, SpO2 93%.    FiO2 (%):  [55 %-100 %] 55 %   Intake/Output Summary (Last 24 hours) at 06-29-24 1400 Last data filed at 06/29/24 1242 Gross per 24 hour  Intake 1350 ml  Output --  Net 1350 ml   There were no vitals filed for this visit.  Examination: General: acute on chronically ill elderly female, confused and anxious, cachetic  HENT: [place], small glued abrasion above right eyebrow, sclera anicteric  Lungs: tachypnea and increased work of breathing with accessory muscle use on BiPap, rhonchi throughout  Cardiovascular: tachycardic, regular, normal S1 and S2 no m/r/g Abdomen: soft, non-tender, non-distended  Extremities: warm, dry, no edema  Neuro: oriented to person only, answers some questions appropriately but mostly confused, moves all extremities spontaneously, generalized weakness  GU: deferred  Resolved problem list   Assessment and Plan   Acute hypoxic respiratory failure Influenza A  Pneumonia, viral versus superimposed bacterial infection Severe sepsis Lactic acidosis CT PE negative for PE, ground glass attenuation throughout both lung fields, large hiatal hernia.  - BiPap for increased work of breathing - Extended respiratory viral panel - Send strep/urine legionella - Send sputum culture if able  - Continue azithromycin  and start vanc/zosyn - Continue to trend lactic acid  - Scheduled duo-nebs and 3% HTS - Chest PT and aggressive pulmonary toilet   Acute metabolic encephalopathy; due to above - Continue to monitor neuro exam  - Delirium precautions: re-orient frequently, avoid sedating medications, normalize day/wake cycle  Elevated BNP Elevated troponin, likely due to demand ischemia Atrial tachycardia ECG with atrial tachycardia, LBBB. Troponin 296>266.   - Obtain Echo  - May need IV diuresis   History of recurrent UTI; patient with complaint of pelvic pain prior to admission - Follow-up  UA - Continue antibiotics per above; recent urine cultures with E. Coli and Proteus sensitive to ceftriaxone    High anion gap metabolic acidosis, due to lactic acidosis +/- starvation ketosis  Hyponatremia - Continue to trend BMP   History of GERD - IV PPI   History of depression History of insomnia  - Holding home buspirone  and fluoxetine  while unable to take PO  Goals of care - Discussed with patient's son, Teretha Chalupa, at bedside - he confirms patient's DNR/DNI. I explained that given her frailty and age we will continue to do everything that we can to treat her pneumonia and help decrease her work of breathing but I am concerned that she may tire out and be unable to maintain her oxygen levels and clear carbon dioxide at which time we would recommend transition to comfort care since she does not want to be on mechanical ventilation. He queries if it is possible for mechanical ventilation to be temporary I explained that in her situation given her frailty it is very unlikely she would be able to come off the ventilator but not impossible. He would like to continue DNI, BiPap is ok.   Nutrition: NPO given tenuous respiratory status VTE ppx: SQH  Labs   CBC: Recent Labs  Lab 2024/06/27 0931  WBC 18.3*  HGB 11.5*  HCT 35.6*  MCV 95.4  PLT 211    Basic Metabolic Panel: Recent Labs  Lab 27-Jun-2024 0931  NA 129*  K 4.4  CL 97*  CO2 14*  GLUCOSE 129*  BUN 29*  CREATININE 0.93  CALCIUM 8.9   GFR: CrCl cannot be calculated (Unknown ideal weight.). Recent Labs  Lab 06/27/2024 0931 2024/06/27 0959 06-27-2024 1238  WBC 18.3*  --   --   LATICACIDVEN  --  3.6* 5.0*    Liver Function Tests: No results for input(s): AST, ALT, ALKPHOS, BILITOT, PROT, ALBUMIN in the last 168 hours. No results for input(s): LIPASE, AMYLASE in the last 168 hours. No results for input(s): AMMONIA in the last 168 hours.  ABG    Component Value Date/Time   PHART 7.41 June 27, 2024  0952   PCO2ART 21 (L) 27-Jun-2024 0952   PO2ART 66 (L) 2024-06-27 0952   HCO3 13.3 (L) 06-27-2024 0952   ACIDBASEDEF 9.4 (H) 06-27-24 0952   O2SAT 94.1 06/27/2024 0952     Coagulation Profile: Recent Labs  Lab 06/27/24 1030  INR 1.1    Cardiac Enzymes: No results for input(s): CKTOTAL, CKMB, CKMBINDEX, TROPONINI in the last 168 hours.  HbA1C: Hgb A1c MFr Bld  Date/Time Value Ref Range Status  12/05/2014 10:49 AM 5.7 4.6 - 6.5 % Final    Comment:    Glycemic Control Guidelines for People with Diabetes:Non Diabetic:  <6%Goal of Therapy: <7%Additional Action Suggested:  >8%   04/02/2014 07:49 AM 5.8 (H) <5.7 % Final    Comment:    (NOTE)  According to the ADA Clinical Practice Recommendations for 2011, when HbA1c is used as a screening test:  >=6.5%   Diagnostic of Diabetes Mellitus           (if abnormal result is confirmed) 5.7-6.4%   Increased risk of developing Diabetes Mellitus References:Diagnosis and Classification of Diabetes Mellitus,Diabetes Care,2011,34(Suppl 1):S62-S69 and Standards of Medical Care in         Diabetes - 2011,Diabetes Care,2011,34 (Suppl 1):S11-S61.    CBG: No results for input(s): GLUCAP in the last 168 hours.  Review of Systems:   Per above  Past Medical History:  She,  has a past medical history of Allergy, Arthritis, Chronic cystitis, Congenital blindness, Depression, Gastric ulcer, H/O: hematuria, Hepatitis, unspecified, Hyperlipidemia, Legally blind in right eye, as defined in [place] , Syncope and collapse (04/01/2014), and UTI (lower urinary tract infection).   Surgical History:   Past Surgical History:  Procedure Laterality Date   ABDOMINAL HYSTERECTOMY     vaginal   BLADDER SUSPENSION     A-P   CATARACT EXTRACTION W/ INTRAOCULAR LENS IMPLANT Left    ESOPHAGOGASTRODUODENOSCOPY     HIP ARTHROPLASTY Left 12/17/2021   Procedure: ARTHROPLASTY BIPOLAR HIP  (HEMIARTHROPLASTY);  Surgeon: Barbarann Oneil BROCKS, MD;  Location: [facility];  Service: Orthopedics;  Laterality: Left;   OPEN REDUCTION INTERNAL FIXATION (ORIF) DISTAL RADIAL FRACTURE Right 03/09/2016   Procedure: OPEN REDUCTION INTERNAL FIXATION (ORIF) DISTAL RADIAL FRACTURE;  Surgeon: Franky Curia, MD;  Location: [facility];  Service: Orthopedics;  Laterality: Right;     Social History:   reports that she has never smoked. She has never used smokeless tobacco. She reports that she does not drink alcohol  and does not use drugs.   Family History:  Her family history is negative for Cancer, Stroke, Heart disease, Hyperlipidemia, Hypertension, Kidney disease, Colon polyps, and Colon cancer.   Allergies Allergies[1]   Home Medications  Prior to Admission medications  Medication Sig Start Date End Date Taking? Authorizing Provider  acetaminophen  (TYLENOL ) 500 MG tablet Take 500 mg by mouth in the morning and at bedtime.    [provider]  aspirin  EC 81 MG tablet Take 81 mg by mouth daily.    [provider]  Bacillus Coagulans-Inulin (PROBIOTIC FORMULA PO) Take 1 capsule by mouth in the morning.    [provider]  busPIRone  (BUSPAR ) 5 MG tablet Take 5 mg by mouth in the morning and at bedtime.    [provider]  cetirizine (ZYRTEC) 10 MG tablet Take 10 mg by mouth at bedtime.    [provider]  cholecalciferol  (VITAMIN D3) 25 MCG (1000 UNIT) tablet Take 1,000 Units by mouth daily.    [provider]  Cranberry-Vitamin C-Probiotic (AZO CRANBERRY PO) Take 1 tablet by mouth daily.    [provider]  FLUoxetine  (PROZAC ) 40 MG capsule Take 40 mg by mouth daily.    [provider]  folic acid  (FOLVITE ) 1 MG tablet Take 1 mg by mouth in the morning.    [provider]  HYDROcodone -acetaminophen  (NORCO/VICODIN) 5-325 MG tablet Take 0.5-1 tablets by mouth every 6 (six) hours as needed (for pain).    [provider]  lidocaine  4 % Place 1 patch onto the skin daily. To lower back. 12 hours on and 12 hours off    [provider]  Multiple Vitamin (DAILY VITE PO) Take 1 tablet by mouth daily.    [provider]  ondansetron  (ZOFRAN ) 4 MG tablet Take  4 mg by mouth at bedtime as needed for nausea or vomiting.    [provider]  pantoprazole  (PROTONIX ) 20 MG tablet Take 20 mg by mouth daily.    [provider]  pantoprazole  (PROTONIX ) 40 MG tablet Take 40 mg by mouth in the morning.    [provider]  Phenol-Glycerin (CHLORASEPTIC MAX SORE THROAT) 1.5-33 % LIQD Use as directed 1 spray in the mouth or throat every 2 (two) hours as needed (for a sore throat).    [provider]  polyethylene glycol (MIRALAX  / GLYCOLAX ) 17 g packet Take 17 g by mouth daily as needed for mild constipation, moderate constipation or severe constipation.    [provider]  Skin Protectants, Misc. (BAZA PROTECT EX) Apply 1 application  topically See admin instructions. Apply to the buttocks 2 times a day    [provider]  zolpidem  (AMBIEN ) 5 MG tablet Take 1 tablet (5 mg total) by mouth at bedtime. 12/28/22   Verdene Purchase, MD     Critical care time: 40 minutes   The patient is critically ill with multiple organ system failure and requires high complexity decision making for assessment and support, frequent evaluation and titration of therapies, advanced monitoring, review of radiographic studies and interpretation of complex data.   Critical Care Time devoted to patient care services, exclusive of separately billable procedures, described in this note is 40 minutes.  Rexene LOISE Blush, PA-C [facility] 2024-07-11 2:08 PM  Please see [email or web address] for pager details.  From 7A-7P if no response, please call [phone] After hours, please call ELink [phone]             [1] No Known Allergies  "

## 2024-06-21 NOTE — Progress Notes (Signed)
" °   07-08-2024 1646  Spiritual Encounters  Type of Visit Initial  Care provided to: Pt and family  Conversation partners present during encounter Nurse  Referral source Chaplain assessment  Reason for visit End-of-life  OnCall Visit No  Interventions  Spiritual Care Interventions Made Established relationship of care and support;Compassionate presence;Supported grief process   I provided spiritual care support for Lalania Haseman and her family. Her son, Glean ([place]) and daughter Olam along with Lisa's son, [facility]).  I invited some reflection on time shared this past weekend while Naydeline was still alert and active. I offered affirmation for the significance of being all together. Helped family to be comfortable and shared how to call chaplain tonight if support desired.  Cicilia Clinger L. Delores HERO.Div "

## 2024-06-21 NOTE — Progress Notes (Signed)
 Transported pt on CT with nurse and CT tech. Pt was transported on heated high flow 55L 60%. O2 saturations remained 97-99 throughout CT scan. Pt transported back to room.

## 2024-06-21 NOTE — ED Triage Notes (Signed)
 Pt arrives via EMS from [place] . Pt was found to be short of breath with labored breathing and a room air saturation in the 70s. Pt was given a neb but did not improve. EMS administered additional neb tx en route.   Pt had an episode of shortness of breath yesterday.

## 2024-06-21 NOTE — Discharge Summary (Signed)
 Physician Discharge Summary  Patient ID: Evelyn [name] MRN: [id number] DOB/AGE: 1924-12-06 89 y.o.  Admit date: 07/09/2024 Discharge date: 06/13/2024    Discharge Diagnoses:  Acute hypoxic respiratory failure 2/2 influenza A and superimposed pneumonia  Severe sepsis High anion gap metabolic acidosis with lactic acidosis Acute metabolic encephalopathy 5.    DNR/DNI                                                           DISCHARGE SUMMARY  Patient is a 89 yo female with history of depression, GERD/PUD, legally blind, recurrent UTI, CVA who presented to ED from [place]  with severe respiratory distress and hypoxia. She was found positive influenza A and CTPE was negative for PE showing GGO in both lung fields and large hiatal hernia. Her respiratory distress has worsened on arrival to the ICU. Requiring high oxygen requirement high flow/bipap, with use of respiratory muscles, and respiratory distress. Patient received solumedrol, IV antibiotics. After discussion with family son and daughter, they confirm her mother's wishes is DNR/DNI. We explained that given her age,  and severity of the pneumonia, her prognosis is poor. Family decided to transition to comfort care. Patient expired  at 7:13pm on 07-09-2024.  Vitals:   07/09/24 1600 07/09/2024 1700 07/09/2024 1800 July 09, 2024 1914  BP:      Pulse: (!) 107 (!) 102 (!) 101   Resp: (!) 26 15 (!) 22 (!) 0  Temp:      TempSrc:      SpO2: 95% (!) 70% (!) 70%      Discharge Labs  BMET Recent Labs  Lab July 09, 2024 0931  NA 129*  K 4.4  CL 97*  CO2 14*  GLUCOSE 129*  BUN 29*  CREATININE 0.93  CALCIUM 8.9    CBC Recent Labs  Lab July 09, 2024 0931  HGB 11.5*  HCT 35.6*  WBC 18.3*  PLT 211    Anti-Coagulation Recent Labs  Lab 2024/07/09 1030  INR 1.1          Allergies as of 2024-07-09   No Known Allergies      Medication List     ASK your doctor about these medications    acetaminophen  500 MG tablet Commonly  known as: TYLENOL  Take 500 mg by mouth 3 (three) times daily.   aspirin  EC 81 MG tablet Take 81 mg by mouth daily.   AZO CRANBERRY PO Take 1 tablet by mouth daily.   BAZA PROTECT EX Apply 1 application  topically See admin instructions. Apply to the buttocks 2 times a day   busPIRone  5 MG tablet Commonly known as: BUSPAR  Take 5 mg by mouth in the morning and at bedtime.   cetirizine 10 MG tablet Commonly known as: ZYRTEC Take 10 mg by mouth at bedtime.   Chloraseptic Max Sore Throat 1.5-33 % Liqd Generic drug: Phenol-Glycerin Use as directed 1 spray in the mouth or throat every 2 (two) hours as needed (for a sore throat).   cholecalciferol  25 MCG (1000 UNIT) tablet Commonly known as: VITAMIN D3 Take 1,000 Units by mouth daily.   ciclopirox 0.77 % cream Commonly known as: LOPROX Apply 1 application  topically See admin instructions. Apply a small amount to affected nails at 8 AM and 8 PM   DAILY VITE PO Take 1 tablet  by mouth daily with breakfast.   FLUoxetine  40 MG capsule Commonly known as: PROZAC  Take 40 mg by mouth in the morning.   FLUoxetine  20 MG capsule Commonly known as: PROZAC  Take 20 mg by mouth in the morning.   folic acid  1 MG tablet Commonly known as: FOLVITE  Take 1 mg by mouth in the morning.   guaiFENesin  600 MG 12 hr tablet Commonly known as: MUCINEX  Take 600 mg by mouth every 12 (twelve) hours.   HYDROcodone -acetaminophen  5-325 MG tablet Commonly known as: NORCO/VICODIN Take 0.5 tablets by mouth in the morning.   ipratropium-albuterol  0.5-2.5 (3) MG/3ML Soln Commonly known as: DUONEB Take 3 mLs by nebulization in the morning and at bedtime.   lidocaine  4 % Place 1 patch onto the skin See admin instructions. Apply 1 new patch to the lower back at 8 AM and remove at 8 PM Ask about: Which instructions should I use?   nitrofurantoin  (macrocrystal-monohydrate) 100 MG capsule Commonly known as: MACROBID  Take 100 mg by mouth daily after  supper.   ondansetron  4 MG tablet Commonly known as: ZOFRAN  Take 4 mg by mouth every 8 (eight) hours as needed for nausea or vomiting.   pantoprazole  20 MG tablet Commonly known as: PROTONIX  Take 20 mg by mouth daily. Ask about: Which instructions should I use?   polyethylene glycol 17 g packet Commonly known as: MIRALAX  / GLYCOLAX  Take 17 g by mouth daily as needed for mild constipation (mix into 6-8 ounces of juice or water).   predniSONE 10 MG tablet Commonly known as: DELTASONE Take 10 mg by mouth daily with breakfast.   PROBIOTIC PO Take 1 capsule by mouth in the morning.   sennosides-docusate sodium  8.6-50 MG tablet Commonly known as: SENOKOT-S Take 2 tablets by mouth at bedtime.         Patient expired at 7:13 pm on 2024-07-12    Signed: Marny Patch, MD [facility] 06/13/2024, 2:32 PM   Please see [email or web address] for pager details.

## 2024-06-21 NOTE — ED Provider Notes (Signed)
 " [place] EMERGENCY DEPARTMENT AT [facility] Provider Note   CSN: [id number] Arrival date & time: 27-Jun-2024  0900     Patient presents with: Shortness of Breath and Respiratory Distress   Evelyn [name] is a 89 y.o. female.   89 year old female presents for evaluation of shortness of breath.  She lives at a nursing home and son to be hypoxic in the 70s on room air.  She is also very tachypneic.  She is a poor historian has difficulty providing history.  She is awake and alert does answer some questions appropriately.  States has been short of breath for a few days.  Has no lung problems and is not on any oxygen at baseline.  She denies any chest pain, fevers, fatigue, or any other symptoms or concerns.   Shortness of Breath Associated symptoms: cough   Associated symptoms: no abdominal pain, no chest pain, no ear pain, no fever, no rash, no sore throat and no vomiting        Prior to Admission medications  Medication Sig Start Date End Date Taking? Authorizing Provider  acetaminophen  (TYLENOL ) 500 MG tablet Take 500 mg by mouth in the morning and at bedtime.    [provider]  aspirin  EC 81 MG tablet Take 81 mg by mouth daily.    [provider]  Bacillus Coagulans-Inulin (PROBIOTIC FORMULA PO) Take 1 capsule by mouth in the morning.    [provider]  busPIRone  (BUSPAR ) 5 MG tablet Take 5 mg by mouth in the morning and at bedtime.    [provider]  cetirizine (ZYRTEC) 10 MG tablet Take 10 mg by mouth at bedtime.    [provider]  cholecalciferol  (VITAMIN D3) 25 MCG (1000 UNIT) tablet Take 1,000 Units by mouth daily.    [provider]  Cranberry-Vitamin C-Probiotic (AZO CRANBERRY PO) Take 1 tablet by mouth daily.    [provider]  FLUoxetine  (PROZAC ) 40 MG capsule Take 40 mg by mouth daily.    [provider]  folic acid  (FOLVITE ) 1 MG tablet Take 1 mg by mouth in the morning.    [provider]  HYDROcodone -acetaminophen  (NORCO/VICODIN) 5-325 MG tablet Take 0.5-1 tablets by mouth every 6 (six) hours as needed (for pain).    [provider]  lidocaine  4 % Place 1 patch onto the skin daily. To lower back. 12 hours on and 12 hours off    [provider]  Multiple Vitamin (DAILY VITE PO) Take 1 tablet by mouth daily.    [provider]  ondansetron  (ZOFRAN ) 4 MG tablet Take 4 mg by mouth at bedtime as needed for nausea or vomiting.    [provider]  pantoprazole  (PROTONIX ) 20 MG tablet Take 20 mg by mouth daily.    [provider]  pantoprazole  (PROTONIX ) 40 MG tablet Take 40 mg by mouth in the morning.    [provider]  Phenol-Glycerin (CHLORASEPTIC MAX SORE THROAT) 1.5-33 % LIQD Use as directed 1 spray in the mouth or throat every 2 (two) hours as needed (for a sore throat).    [provider]  polyethylene glycol (MIRALAX  / GLYCOLAX ) 17 g packet Take 17 g by mouth daily as needed for mild constipation, moderate constipation or severe constipation.    [provider]  Skin Protectants, Misc. (BAZA PROTECT EX) Apply 1 application  topically See admin instructions. Apply to the buttocks 2 times a day    [provider]  zolpidem  (AMBIEN ) 5 MG tablet Take 1 tablet (5 mg total) by mouth at bedtime. 12/28/22   Krishnan, Gokul, MD    Allergies: Patient has no known allergies.    Review of Systems  Constitutional:  Negative for chills and fever.  HENT:  Negative for ear pain and sore throat.   Eyes:  Negative for pain and visual disturbance.  Respiratory:  Positive for cough and shortness of breath.   Cardiovascular:  Negative for chest pain and palpitations.  Gastrointestinal:  Negative for abdominal pain and vomiting.  Genitourinary:  Negative for dysuria and hematuria.  Musculoskeletal:  Negative for arthralgias and back pain.  Skin:  Negative for color change and rash.  Neurological:   Negative for seizures and syncope.  All other systems reviewed and are negative.   Updated Vital Signs BP 92/69   Pulse (!) 114   Temp (!) 101.1 F (38.4 C) (Rectal)   Resp (!) 34   SpO2 93%   Physical Exam Vitals and nursing note reviewed.  Constitutional:      General: She is not in acute distress.    Appearance: She is well-developed. She is ill-appearing.  HENT:     Head: Normocephalic and atraumatic.  Eyes:     Conjunctiva/sclera: Conjunctivae normal.  Cardiovascular:     Rate and Rhythm: Regular rhythm. Tachycardia present.     Heart sounds: No murmur heard. Pulmonary:     Effort: Tachypnea and respiratory distress present.     Breath sounds: Rhonchi present.  Abdominal:     Palpations: Abdomen is soft.     Tenderness: There is no abdominal tenderness.  Musculoskeletal:        General: No swelling.     Cervical back: Neck supple.  Skin:    General: Skin is warm and dry.     Capillary Refill: Capillary refill takes less than 2 seconds.  Neurological:     Mental Status: She is alert.  Psychiatric:        Mood and Affect: Mood normal.     (all labs ordered are listed, but only abnormal results are displayed) Labs Reviewed  RESP PANEL BY RT-PCR (RSV, FLU A&B, COVID)  RVPGX2 - Abnormal; Notable for the following components:      Result Value   Influenza A by PCR POSITIVE (*)    All other components within normal limits  BASIC METABOLIC PANEL WITH GFR - Abnormal; Notable for the following components:   Sodium 129 (*)    Chloride 97 (*)    CO2 14 (*)    Glucose, Bld 129 (*)    BUN 29 (*)    GFR, Estimated 56 (*)    Anion gap 18 (*)    All other components within normal limits  CBC - Abnormal; Notable for the following components:   WBC 18.3 (*)    RBC 3.73 (*)    Hemoglobin 11.5 (*)    HCT 35.6 (*)    All other components within normal limits  PRO BRAIN NATRIURETIC PEPTIDE - Abnormal; Notable for the following components:   Pro Brain Natriuretic Peptide  9,697.0 (*)    All other components within normal limits  BLOOD GAS, ARTERIAL - Abnormal; Notable for the following components:   pCO2 arterial 21 (*)    pO2, Arterial 66 (*)    Bicarbonate 13.3 (*)    Acid-base deficit 9.4 (*)    All other components within normal limits  I-STAT CG4 LACTIC ACID, ED - Abnormal;  Notable for the following components:   Lactic Acid, Venous 3.6 (*)    All other components within normal limits  I-STAT CG4 LACTIC ACID, ED - Abnormal; Notable for the following components:   Lactic Acid, Venous 5.0 (*)    All other components within normal limits  TROPONIN T, HIGH SENSITIVITY - Abnormal; Notable for the following components:   Troponin T High Sensitivity 296 (*)    All other components within normal limits  TROPONIN T, HIGH SENSITIVITY - Abnormal; Notable for the following components:   Troponin T High Sensitivity 266 (*)    All other components within normal limits  CULTURE, BLOOD (ROUTINE X 2)  CULTURE, BLOOD (ROUTINE X 2)  PROTIME-INR    EKG: EKG Interpretation Date/Time:  Tuesday 2024/06/21 14:14:45 EST Ventricular Rate:  131 PR Interval:  121 QRS Duration:  131 QT Interval:  385 QTC Calculation: 569 R Axis:   -20  Text Interpretation: Ectopic atrial tachycardia, unifocal Left bundle branch block More pronounced when compared with prior EKG from earlier today Confirmed by Gennaro Bouchard ([id number]) on 06/21/2024 2:18:43 PM  Radiology: CT Angio Chest Pulmonary Embolism (PE) W or WO Contrast Result Date: Jun 21, 2024 CLINICAL DATA:  Shortness of breath. Hypoxia. High probability for pulmonary embolism. EXAM: CT ANGIOGRAPHY CHEST WITH CONTRAST TECHNIQUE: Multidetector CT imaging of the chest was performed using the standard protocol during bolus administration of intravenous contrast. Multiplanar CT image reconstructions and MIPs were obtained to evaluate the vascular anatomy. RADIATION DOSE REDUCTION: This exam was performed according to the  departmental dose-optimization program which includes automated exposure control, adjustment of the mA and/or kV according to patient size and/or use of iterative reconstruction technique. CONTRAST:  75mL OMNIPAQUE  IOHEXOL  350 MG/ML SOLN COMPARISON:  04/02/2022 FINDINGS: Cardiovascular: Satisfactory opacification of pulmonary arteries noted, and no pulmonary emboli identified. No evidence of thoracic aortic dissection or aneurysm. Aortic and coronary atherosclerotic calcification noted. Mediastinum/Nodes: No masses or pathologically enlarged lymph nodes identified. Lungs/Pleura: Mosaic pattern of ground-glass attenuation is seen throughout both lungs which is new since previous study. No evidence of pleural effusion or mass. Upper abdomen: Large hiatal hernia again seen. Musculoskeletal: No suspicious bone lesions identified. Old fracture deformity of sternal manubrium again seen. Stable old lower thoracic vertebral body compression fracture. Thoracic kyphoscoliosis also stable. Review of the MIP images confirms the above findings. IMPRESSION: No evidence of pulmonary embolism. New mosaic pattern of ground-glass attenuation throughout both lungs. This finding is nonspecific, with differential diagnosis including pulmonary edema, atypical infection, hypersensitivity pneumonitis, and hemorrhage. Large hiatal hernia. Electronically Signed   By: Norleen DELENA Kil M.D.   On: June 21, 2024 12:26   DG Chest Port 1 View Result Date: 06/21/24 EXAM: 1 VIEW(S) XRAY OF THE CHEST 06/21/24 10:18:00 AM COMPARISON: 12/12/2023 CLINICAL HISTORY: SOB (shortness of breath) FINDINGS: LUNGS AND PLEURA: Diffuse interstitial opacities throughout both lungs, more prominent than on the prior study. Nodular airspace opacity in the right upper lung zone measuring 2.3 cm. No pleural effusion. No pneumothorax. HEART AND MEDIASTINUM: Tortuous aorta with diffuse atherosclerosis. Redemonstrated large hiatal hernia. BONES AND SOFT TISSUES:  Osteopenia. Multiple remote right sided rib fractures again noted. Multilevel thoracic osteophytosis. No acute osseous abnormality. IMPRESSION: 1. Diffuse interstitial opacities throughout both lungs, more prominent than on the prior study, which may represent changes of interstitial edema or atypical/viral infection. 2. Nodular airspace opacity in the right upper lung zone measuring 2.3 cm, possibly a superimposed bronchopneumonia or neoplasm. Nonemergent chest CT should be considered for further characterization. Electronically  signed by: Rogelia Myers MD Jun 28, 2024 11:32 AM EST RP Workstation: [id number]     .Critical Care  Performed by: Gennaro Duwaine CROME, DO Authorized by: Gennaro Duwaine CROME, DO   Critical care provider statement:    Critical care time (minutes):  78   Critical care time was exclusive of:  Separately billable procedures and treating other patients and teaching time   Critical care was necessary to treat or prevent imminent or life-threatening deterioration of the following conditions:  Cardiac failure, circulatory failure, shock and sepsis   Critical care was time spent personally by me on the following activities:  Development of treatment plan with patient or surrogate, discussions with consultants, evaluation of patient's response to treatment, examination of patient, interpretation of cardiac output measurements, obtaining history from patient or surrogate, ordering and performing treatments and interventions, ordering and review of laboratory studies, ordering and review of radiographic studies, pulse oximetry, re-evaluation of patient's condition and review of old charts   Care discussed with: admitting provider      Medications Ordered in the ED  Chlorhexidine  Gluconate Cloth 2 % PADS 6 each (6 each Topical Given Jun 28, 2024 1513)  heparin  injection 5,000 Units (has no administration in time range)  ondansetron  (ZOFRAN ) injection 4 mg (has no administration in time range)   0.9 %  sodium chloride  infusion ( Intravenous Not Given 06/28/24 1556)  acetaminophen  (TYLENOL ) tablet 650 mg (has no administration in time range)    Or  acetaminophen  (TYLENOL ) suppository 650 mg (has no administration in time range)  glycopyrrolate  (ROBINUL ) tablet 1 mg ( Oral See Alternative Jun 28, 2024 1543)    Or  glycopyrrolate  (ROBINUL ) injection 0.2 mg ( Subcutaneous See Alternative 06-28-2024 1543)    Or  glycopyrrolate  (ROBINUL ) injection 0.2 mg (0.2 mg Intravenous Given Jun 28, 2024 1543)  artificial tears ophthalmic solution 1 drop (has no administration in time range)  midazolam  PF (VERSED ) injection 2-4 mg (4 mg Intravenous Given 28-Jun-2024 1538)  morphine  bolus via infusion 5 mg (5 mg Intravenous Bolus from Bag 06/28/2024 1606)  morphine  100mg  in NS (1mg /mL) infusion - premix (10 mg/hr Intravenous Infusion Verify June 28, 2024 1609)  lactated ringers  bolus 1,000 mL (0 mLs Intravenous Stopped 06/28/24 1101)  ipratropium-albuterol  (DUONEB) 0.5-2.5 (3) MG/3ML nebulizer solution 3 mL (3 mLs Nebulization Given 2024/06/28 1000)  cefTRIAXone  (ROCEPHIN ) 1 g in sodium chloride  0.9 % 100 mL IVPB (0 g Intravenous Stopped Jun 28, 2024 1110)  azithromycin  (ZITHROMAX ) 500 mg in sodium chloride  0.9 % 250 mL IVPB (0 mg Intravenous Stopped 2024/06/28 1242)  aspirin  chewable tablet 324 mg (324 mg Oral Given 06/28/2024 1136)  iohexol  (OMNIPAQUE ) 350 MG/ML injection 75 mL (75 mLs Intravenous Contrast Given 06/28/24 1121)  acetaminophen  (TYLENOL ) tablet 1,000 mg (1,000 mg Oral Given June 28, 2024 1249)  LORazepam  (ATIVAN ) injection 0.25 mg (0.25 mg Intravenous Given 2024-06-28 1430)  furosemide  (LASIX ) injection 40 mg (40 mg Intravenous Given 06/28/24 1433)  morphine  (PF) 2 MG/ML injection 0.5 mg (0.5 mg Intravenous Given 06-28-2024 1513)                                    Medical Decision Making Social determinants of health: Patient lives in a nursing home  Cardiac monitor interpretation: Sinus tachycardia,  occasional PVCs  Patient arrived tachypneic and tachycardic and hypoxic in the 80s.  We initially tried nasal cannula and hyponasal cannula and eventually escalated to humidified high flow.  Oxygen saturation improved but patient was still  very tachypneic, and ultimate decision was made to place patient on BiPAP.  Patient and son at bedside were in agreement with this plan, however they do mention that she is a DNR DNI.  She was febrile on arrival, and tachycardic but blood pressure was stable at that time.  Sepsis workup was started, or she was not given a 30 cc/kg IV fluid bolus as she was found to be in acute CHF with diffuse pulmonary edema.  Was started on Rocephin  and azithromycin  for possible pneumonia on her x-ray as well, although I think influenza A is also contributing to her fever.  She was given Tylenol  for her fever as well.  She did become confused and more tachycardic throughout her stay within climbing lactate as well.  Had multiple discussions with family members at the bedside.  Discussed patient's case with cardiology and recommendations as below.  He recommended against heparin  drip at this time, thinks this is likely CHF and would recommend ICU admission.  Discussed patient's case with critical care and they accepted patient to their service for further workup and management.  All results and plan were discussed with patient and family at bedside.  Problems Addressed: Acute hypoxic respiratory failure (HCC): acute illness or injury that poses a threat to life or bodily functions Congestive heart failure, unspecified HF chronicity, unspecified heart failure type [facility]): acute illness or injury that poses a threat to life or bodily functions Influenza A: acute illness or injury Pneumonia due to infectious organism, unspecified laterality, unspecified part of lung: acute illness or injury Sepsis, due to unspecified organism, unspecified whether acute organ dysfunction present [facility]): acute  illness or injury that poses a threat to life or bodily functions  Amount and/or Complexity of Data Reviewed Independent Historian: caregiver    Details: Son-son at bedside states that yesterday she had a normal chest x-ray at nursing home but was diagnosed with influenza External Data Reviewed: notes.    Details: Prior ED records reviewed and patient seen 12-6/25 for fall Labs: ordered. Decision-making details documented in ED Course.    Details: Ordered And reviewed by me, patient has a leukocytosis as well as climbing lactate, elevated BNP and troponins are elevated but flat Radiology: ordered and independent interpretation performed. Decision-making details documented in ED Course.    Details: Ordered and interpreted by me independently of radiology Chest X-ray: Shows significant bilateral diffuse pulmonary edema versus ARDS CT PE study: Shows diffuse pulmonary edema and parenchymal injury, no PE ECG/medicine tests: ordered and independent interpretation performed. Decision-making details documented in ED Course.    Details: Ordered and interpreted by me in the absence of cardiology and shows sinus tachycardia, new LBBB, nonspecific ST changes. Was discussed with cardiology  Discussion of management or test interpretation with external provider(s): Dr. Mona - cardiology- I spoke with him on the phone regarding patient's case. He feels there is no need for acute cardiac intervention at this time as this is likely heart failure, would recommend echocardiogram. States no need for heparin  at this time  Dr. Jovita - I spoke with her on the phone regarding patient's case. She will admit the patient to the ICU for further workup and management.   Risk OTC drugs. Prescription drug management. Parenteral controlled substances. Drug therapy requiring intensive monitoring for toxicity. Decision regarding hospitalization. Diagnosis or treatment significantly limited by social  determinants of health. Risk Details: CRITICAL CARE Performed by: Duwaine LITTIE Fusi   Total critical care time: 78 minutes  Critical care time  was exclusive of separately billable procedures and treating other patients.  Critical care was necessary to treat or prevent imminent or life-threatening deterioration.  Critical care was time spent personally by me on the following activities: development of treatment plan with patient and/or surrogate as well as nursing, discussions with consultants, evaluation of patient's response to treatment, examination of patient, obtaining history from patient or surrogate, ordering and performing treatments and interventions, ordering and review of laboratory studies, ordering and review of radiographic studies, pulse oximetry and re-evaluation of patient's condition.   Critical Care Total time providing critical care: 78 minutes     Final diagnoses:  Acute hypoxic respiratory failure (HCC)  Congestive heart failure, unspecified HF chronicity, unspecified heart failure type (HCC)  Pneumonia due to infectious organism, unspecified laterality, unspecified part of lung  Sepsis, due to unspecified organism, unspecified whether acute organ dysfunction present [facility])  Influenza A    ED Discharge Orders     None          Gennaro Duwaine CROME, DO 07/05/24 1610  "

## 2024-06-21 NOTE — Progress Notes (Addendum)
 IPAL Note  On arrival to ICU patient with increased work of breathing, tachypnea, air hunger and somnolence. Transitioned to high flow nasal cannula. Dr. Adrien and myself spoke to family, son, Glean and daughter, Olam, and explained given age and frailty and current clinical presentation she is unlikely to survive. Decision made to transition to comfort care. Comfort orders placed.  Rexene LOISE Blush, PA-C [facility] 06/25/2024 3:34 PM  Please see [email or web address] for pager details.  From 7A-7P if no response, please call [phone] After hours, please call ELink [phone],me

## 2024-06-21 NOTE — Progress Notes (Signed)
" °  Echocardiogram 2D Echocardiogram has been attempted, pt getting bedside care.  Tinnie FORBES Gosling RDCS 2024-07-12, 3:19 PM "

## 2024-06-21 DEATH — deceased
